# Patient Record
Sex: Male | Born: 1955 | Race: White | Hispanic: No | Marital: Married | Smoking: Never smoker
Health system: Southern US, Community
[De-identification: ages and names within clinical notes are randomized; demographics above are authoritative.]

## PROBLEM LIST (undated history)

## (undated) DIAGNOSIS — E785 Hyperlipidemia, unspecified: Secondary | ICD-10-CM

## (undated) DIAGNOSIS — J449 Chronic obstructive pulmonary disease, unspecified: Secondary | ICD-10-CM

## (undated) DIAGNOSIS — F32A Depression, unspecified: Secondary | ICD-10-CM

## (undated) DIAGNOSIS — L909 Atrophic disorder of skin, unspecified: Secondary | ICD-10-CM

## (undated) DIAGNOSIS — M1990 Unspecified osteoarthritis, unspecified site: Secondary | ICD-10-CM

## (undated) DIAGNOSIS — I10 Essential (primary) hypertension: Secondary | ICD-10-CM

## (undated) DIAGNOSIS — J189 Pneumonia, unspecified organism: Secondary | ICD-10-CM

## (undated) DIAGNOSIS — N209 Urinary calculus, unspecified: Secondary | ICD-10-CM

## (undated) DIAGNOSIS — N400 Enlarged prostate without lower urinary tract symptoms: Secondary | ICD-10-CM

## (undated) DIAGNOSIS — G4730 Sleep apnea, unspecified: Secondary | ICD-10-CM

## (undated) DIAGNOSIS — F419 Anxiety disorder, unspecified: Secondary | ICD-10-CM

## (undated) DIAGNOSIS — E669 Obesity, unspecified: Secondary | ICD-10-CM

## (undated) DIAGNOSIS — R0600 Dyspnea, unspecified: Secondary | ICD-10-CM

## (undated) DIAGNOSIS — L02219 Cutaneous abscess of trunk, unspecified: Secondary | ICD-10-CM

## (undated) DIAGNOSIS — E119 Type 2 diabetes mellitus without complications: Secondary | ICD-10-CM

## (undated) DIAGNOSIS — IMO0002 Reserved for concepts with insufficient information to code with codable children: Secondary | ICD-10-CM

## (undated) DIAGNOSIS — L919 Hypertrophic disorder of the skin, unspecified: Secondary | ICD-10-CM

## (undated) DIAGNOSIS — L03319 Cellulitis of trunk, unspecified: Secondary | ICD-10-CM

## (undated) HISTORY — DX: Reserved for concepts with insufficient information to code with codable children: IMO0002

## (undated) HISTORY — DX: Obesity, unspecified: E66.9

## (undated) HISTORY — PX: TONSILLECTOMY: SUR1361

## (undated) HISTORY — DX: Cellulitis of trunk, unspecified: L03.319

## (undated) HISTORY — DX: Type 2 diabetes mellitus without complications: E11.9

## (undated) HISTORY — DX: Hypertrophic disorder of the skin, unspecified: L91.9

## (undated) HISTORY — DX: Hyperlipidemia, unspecified: E78.5

## (undated) HISTORY — DX: Cutaneous abscess of trunk, unspecified: L02.219

## (undated) HISTORY — DX: Atrophic disorder of skin, unspecified: L90.9

## (undated) HISTORY — DX: Essential (primary) hypertension: I10

---

## 1978-08-05 HISTORY — PX: OTHER SURGICAL HISTORY: SHX169

## 2002-06-16 ENCOUNTER — Ambulatory Visit (HOSPITAL_BASED_OUTPATIENT_CLINIC_OR_DEPARTMENT_OTHER): Admission: RE | Admit: 2002-06-16 | Discharge: 2002-06-16 | Payer: Self-pay | Admitting: Otolaryngology

## 2002-08-05 HISTORY — PX: OTHER SURGICAL HISTORY: SHX169

## 2002-08-09 ENCOUNTER — Ambulatory Visit (HOSPITAL_BASED_OUTPATIENT_CLINIC_OR_DEPARTMENT_OTHER): Admission: RE | Admit: 2002-08-09 | Discharge: 2002-08-09 | Payer: Self-pay | Admitting: Otolaryngology

## 2002-08-18 ENCOUNTER — Emergency Department (HOSPITAL_COMMUNITY): Admission: EM | Admit: 2002-08-18 | Discharge: 2002-08-18 | Payer: Self-pay | Admitting: Emergency Medicine

## 2004-03-29 ENCOUNTER — Emergency Department (HOSPITAL_COMMUNITY): Admission: EM | Admit: 2004-03-29 | Discharge: 2004-03-29 | Payer: Self-pay | Admitting: Emergency Medicine

## 2004-04-10 ENCOUNTER — Encounter: Admission: RE | Admit: 2004-04-10 | Discharge: 2004-04-10 | Payer: Self-pay | Admitting: Internal Medicine

## 2006-08-14 ENCOUNTER — Encounter: Admission: RE | Admit: 2006-08-14 | Discharge: 2006-08-14 | Payer: Self-pay | Admitting: Internal Medicine

## 2007-12-21 ENCOUNTER — Ambulatory Visit: Payer: Self-pay | Admitting: Family Medicine

## 2007-12-21 DIAGNOSIS — Z87442 Personal history of urinary calculi: Secondary | ICD-10-CM | POA: Insufficient documentation

## 2007-12-21 DIAGNOSIS — K219 Gastro-esophageal reflux disease without esophagitis: Secondary | ICD-10-CM | POA: Insufficient documentation

## 2007-12-21 DIAGNOSIS — J309 Allergic rhinitis, unspecified: Secondary | ICD-10-CM | POA: Insufficient documentation

## 2007-12-22 ENCOUNTER — Emergency Department (HOSPITAL_COMMUNITY): Admission: EM | Admit: 2007-12-22 | Discharge: 2007-12-22 | Payer: Self-pay | Admitting: Emergency Medicine

## 2007-12-22 ENCOUNTER — Telehealth: Payer: Self-pay | Admitting: Family Medicine

## 2008-01-06 ENCOUNTER — Encounter: Payer: Self-pay | Admitting: Family Medicine

## 2008-03-29 ENCOUNTER — Telehealth: Payer: Self-pay | Admitting: Family Medicine

## 2008-04-04 ENCOUNTER — Ambulatory Visit: Payer: Self-pay | Admitting: Family Medicine

## 2008-04-04 DIAGNOSIS — D179 Benign lipomatous neoplasm, unspecified: Secondary | ICD-10-CM | POA: Insufficient documentation

## 2010-09-02 LAB — CONVERTED CEMR LAB
ALT: 24 units/L (ref 0–53)
AST: 24 units/L (ref 0–37)
Albumin: 4.2 g/dL (ref 3.5–5.2)
BUN: 16 mg/dL (ref 6–23)
Basophils Absolute: 0 10*3/uL (ref 0.0–0.1)
Basophils Relative: 0.6 % (ref 0.0–1.0)
Blood in Urine, dipstick: NEGATIVE
Chloride: 106 meq/L (ref 96–112)
Cholesterol: 232 mg/dL (ref 0–200)
Direct LDL: 162.1 mg/dL
Eosinophils Relative: 5.5 % — ABNORMAL HIGH (ref 0.0–5.0)
GFR calc non Af Amer: 95 mL/min
HDL: 34.3 mg/dL — ABNORMAL LOW (ref >39.0)
MCHC: 34.3 g/dL (ref 30.0–36.0)
Monocytes Absolute: 0.7 10*3/uL (ref 0.1–1.0)
Monocytes Relative: 10.6 % (ref 3.0–12.0)
Neutrophils Relative %: 60.8 % (ref 43.0–77.0)
Nitrite: NEGATIVE
PSA: 2.77 ng/mL (ref 0.10–4.00)
Platelets: 257 10*3/uL (ref 150–400)
RDW: 12.4 % (ref 11.5–14.6)
Specific Gravity, Urine: 1.025
TSH: 2.12 microintl units/mL (ref 0.35–5.50)
Total Protein: 7.2 g/dL (ref 6.0–8.3)
VLDL: 35 mg/dL (ref 0–40)

## 2010-12-21 NOTE — Op Note (Signed)
Clayton [name], Clayton [name]                          ACCOUNT NO.:  [id number]   MEDICAL RECORD NO.:  [id number]                   PATIENT TYPE:  AMB   LOCATION:  DSC                                  FACILITY:  [facility]   PHYSICIAN:  Kinnie Scales. Annalee Genta, M.D.            DATE OF BIRTH:  10/09/1955   DATE OF PROCEDURE:  08/09/2002  DATE OF DISCHARGE:                                 OPERATIVE REPORT   PREOPERATIVE DIAGNOSES:  1. Nasal septal deviation with nasal obstruction.  2. Inferior turbinate hypertrophy.  3. Uvula hypertrophy.  4. Mild obstructive sleep apnea.   POSTOPERATIVE DIAGNOSES:  1. Nasal septal deviation with nasal obstruction.  2. Inferior turbinate hypertrophy.  3. Uvula hypertrophy.  4. Mild obstructive sleep apnea.   OPERATION:  1. Nasal septoplasty.  2. Bilateral inferior turbinate intramural cautery.  3. Uvulectomy.   SURGEON:  Kinnie Scales. Annalee Genta, M.D.   ANESTHESIA:  General endotracheal   COMPLICATIONS:  None   ESTIMATED BLOOD LOSS:  Approximately 50 cc.   DISPOSITION:  The patient was transferred from the operating room to the  recovery room in stable condition.   FOLLOW UP:  He will follow up in my office in one week for postoperative  care.   BRIEF HISTORY:  The patient is a 55 year old male who is referred for  evaluation of heavy night-time snoring, nasal airway obstruction and  possible obstructive sleep apnea.  Work-up including inpatient sleep study  showed a mild level of sleep apnea with RDI of 11 events per hour.  On  examination, the patient was found to have severely deviated nasal septum  with nasal obstruction with nasal obstruction, turbinate hypertrophy and  uvula hypertrophy.  Given his history and findings, I recommended that we  undertake surgical intervention.  Septoplasty, turbinate reduction and  uvulectomy were discussed and the patient understood the risks, benefits,  and possible complications of each of these surgical procedures  and agreed  to proceed as outlined above.   DESCRIPTION OF PROCEDURE:  The patient was brought to the operating room on  August 09, 2002 and placed in a supine position on the operating table.  General endotracheal anesthesia was established without difficulty.  When  the patient was adequately anesthetized, his nasal cavity was injected with  12 cc of 1% Lidocaine with 1:100,000 epinephrine solution injected in a  submucosal fashion along the nasal septum and inferior turbinates  bilaterally.  The patient's nose was then packed with Afrin soaked cottonoid  pledgets.  These were left in place for approximately 10 minutes to allow  for vasoconstriction.  The patient was prepped and draped in a sterile  fashion.  The surgical procedure was begun with a left hemitransfixion  incision along the left aspect of the septum.  This was carried through the  mucosa and underlying submucosa and a mucoperichondrial flap was elevated  from anterior to posterior along the left hand side.  The patient had  severely deviated nasal septum with left septal spurring and obstruction.  Bony cartilaginous junction was crossed at the midline and a mucoperiosteal  flap was elevated along the patient's right hand side.  A 4 mm osteotome was  used to mobilize deviated bone and cartilage in the mid and posterior  aspects of the nasal septum and large septal spur was resected using the 4  mm osteotome preserving the overlying mucosa.  The mid cartilaginous septum  was resected.  The anterior columella and dorsal cartilage was left intact  as this was not significantly deviated.  Resected cartilage was morselized  and returned to the mucoperichondrial pocket and the mucoperichondrial flap  was reapproximated with a 4-0 gut suture on a Keith needle in a horizontal  mattress fashion.  Hemitransfixion incision was closed with the same stitch  and at the conclusion of the procedure bilateral Doyle nasal septal splints   were placed after the application of Bactroban ointment and sutured into  position with a 3-0 Ethibon suture.   Attention then turned to the inferior turbinates where bilateral inferior  turbinate intramural cautery was performed with bipolar cautery set at 12  watts, two paths were made in the submucosal fascia and each inferior  turbinate.  When turbinates were adequately cauterized they were out  fractured to create a more patent nasal cavity.   Attention was then turned to the patient's oral cavity and oral pharynx.  A  Crowe-Davis mouth gag was inserted without difficulty.  There were no loose  or broken teeth.  The patient was found to have uvula hypertrophy.  Using  Bovie electrocautery, the uvula was transected at its basal attachment to  the palate.  There was no active bleeding.  The patient's nasal cavity,  nasopharynx, oral cavity and oropharynx were then irrigated and suctioned.  An orogastric tube was passed and the stomach contents were aspirated.  The  patient was then awakened from his anesthetic.  He was extubated and was  transferred from the operating room to the recovery room in stable  condition.  There were no complications.  Estimated blood loss was less than  50 cc.                                               Kinnie Scales. Annalee Genta, M.D.    DLS/MEDQ  D:  16/05/9603  T:  08/09/2002  Job:  [id number]

## 2010-12-21 NOTE — Consult Note (Signed)
   NAME:  BUTLER, VEGH NO.:  [id number]   MEDICAL RECORD NO.:  [id number]                   PATIENT TYPE:  EMS   LOCATION:  MAJO                                 FACILITY:  [facility]   PHYSICIAN:  Kristine Garbe. Ezzard Standing, M.D.         DATE OF BIRTH:  11/24/1955   DATE OF CONSULTATION:  DATE OF DISCHARGE:                                   CONSULTATION   REASON FOR ER VISIT:  Patient with hemorrhage post uvulectomy eight days  ago.   BRIEF HISTORY:  Mr. Bautch is a 55 year old gentleman who is status post  nasal surgery and uvulectomy eight days ago and starting having some  bleeding from the uvular region this morning, was seen by Dr. Arletha Grippe in the  office where a stitch was placed and the area was cauterized with silver  nitrate.  This evening, about 7 o'clock, he had recurrent bleeding that  persisted for over an hour.  He presents to the emergency room because of  recurrent bleeding from uvula status post uvulectomy.   On initial exam in the ER the bleeding had ceased and the uvula was clear  with no clot, but after a brief episode of coughing he had recurrent  bleeding from the right side of the uvula, this area was cauterized again  with silver nitrate and stopped the oozing.  There is no obvious brisk  bleeding but more of an ooze from the right side of the uvula.   IMPRESSION:  Hemorrhage status post uvulectomy.   RECOMMENDATIONS:  This was cauterized with silver nitrate in the emergency  room, he was given a prescription for Lortab elixir 15-30 cc q.4h. p.r.n.  pain, instructed on a liquid diet and to contact Dr. Flossie Dibble office in  the morning.                                                 Kristine Garbe. Ezzard Standing, M.D.    CEN/MEDQ  D:  08/18/2002  T:  08/19/2002  Job:  [id number]   cc:   Peggyann Shoals, M.D.

## 2011-05-01 LAB — URINALYSIS, ROUTINE W REFLEX MICROSCOPIC
Bilirubin Urine: NEGATIVE
Glucose, UA: 100 — AB
Nitrite: NEGATIVE
Urobilinogen, UA: 0.2
pH: 6

## 2011-05-01 LAB — POCT I-STAT, CHEM 8
BUN: 18
Calcium, Ion: 1.14
Glucose, Bld: 109 — ABNORMAL HIGH
Potassium: 4
Sodium: 141
TCO2: 24

## 2011-05-01 LAB — URINE MICROSCOPIC-ADD ON

## 2011-06-24 ENCOUNTER — Encounter: Payer: Self-pay | Admitting: Emergency Medicine

## 2011-06-24 ENCOUNTER — Encounter (INDEPENDENT_AMBULATORY_CARE_PROVIDER_SITE_OTHER): Payer: Self-pay

## 2011-06-24 ENCOUNTER — Encounter (INDEPENDENT_AMBULATORY_CARE_PROVIDER_SITE_OTHER): Payer: Self-pay | Admitting: Surgery

## 2011-06-25 ENCOUNTER — Encounter: Payer: Self-pay | Admitting: Emergency Medicine

## 2011-06-25 ENCOUNTER — Ambulatory Visit (INDEPENDENT_AMBULATORY_CARE_PROVIDER_SITE_OTHER): Payer: Managed Care, Other (non HMO) | Admitting: Emergency Medicine

## 2011-06-25 VITALS — BP 120/78 | HR 83 | Temp 98.1°F | Ht 69.0 in | Wt 249.4 lb

## 2011-06-25 DIAGNOSIS — R05 Cough: Secondary | ICD-10-CM

## 2011-06-25 DIAGNOSIS — R053 Chronic cough: Secondary | ICD-10-CM | POA: Insufficient documentation

## 2011-06-25 DIAGNOSIS — R059 Cough, unspecified: Secondary | ICD-10-CM

## 2011-06-25 NOTE — Patient Instructions (Addendum)
We will perform full Pulmonary Function Testing at your follow up visit Stop Flovent for now You can use the ProAir if needed for your breathing or cough Follow with Dr Delton Coombes next available opening

## 2011-06-25 NOTE — Assessment & Plan Note (Signed)
Could reflect cough-variant asthma. I do not see anything on CXR to support ILD or HSP (from the bat exposure). Need to determine whether this is primarily upper airway vs lower, will get PFT to determine - fullPFT - rov to review -stop flovent for now as potential irritant to the UA - If no clear evidence asthma on PFT then would empirically treat GERD and PND

## 2011-06-25 NOTE — Progress Notes (Signed)
Subjective:    Patient ID: Clayton [name], male    DOB: 1955/08/15, 55 y.o.   MRN: [id number]  HPI 55 yo man, never smoker, has had difficulty with chronic cough for many years, almost always following a URI and then lasting up to 3 months. Can be bothered by dust, fumes and the cold air temperature. He has been treated before with inhaled BD's, also with inhaled steroids. Has also been rx in the remote past for GERD, can't recall whether this helped the cough. Has had allergic sx in past w cats and dogs.  Has not had a lot of drainage or PND during most recent episode last 2 months. Cough sometimes brings up white, sometimes non-productive. Cough often set off by talking, also recently raking the leaves. Was recently started on flovent + SABA, not using the flovent regularly. He gets SOB at times with the cough, can also happen w climbing stairs. No wheezing. Has been on pred in the past, can't recall if it helped.   He has noticed that there are bats in the attic of home, has some exposure. Has been in [employer], [place] travel. No known TB exposure.    Review of Systems  Constitutional: Negative.  Negative for fever and unexpected weight change.  HENT: Negative.  Negative for ear pain, nosebleeds, congestion, sore throat, rhinorrhea, sneezing, trouble swallowing, dental problem, postnasal drip and sinus pressure.   Eyes: Negative.  Negative for redness and itching.  Respiratory: Positive for cough and shortness of breath. Negative for chest tightness and wheezing.   Cardiovascular: Negative.  Negative for palpitations and leg swelling.  Gastrointestinal: Negative.  Negative for nausea and vomiting.  Genitourinary: Negative.  Negative for dysuria.  Musculoskeletal: Negative.  Negative for joint swelling.  Skin: Negative.  Negative for rash.  Neurological: Negative.  Negative for headaches.  Hematological: Negative.  Does not bruise/bleed easily.  Psychiatric/Behavioral: Negative.  Negative  for dysphoric mood. The patient is not nervous/anxious.     Past Medical History  Diagnosis Date  . Asthma   . Obesity   . Cyst     shoulder  . Vitamin D deficiency   . Hypertrophic condition of skin   . Atrophic condition of skin   . Cellulitis/abscess - trunk      Family History  Problem Relation Age of Onset  . Heart attack Father   . Allergies Mother   . Asthma Mother      History   Social History  . Marital Status: Married    Spouse Name: N/A    Number of Children: N/A  . 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 Smoking status: Never Smoker   . Smokeless tobacco: Never Used  . Alcohol Use: Yes  . Drug Use: No  . Sexually Active: Not on file   Other Topics Concern  . Not on file   Social History Narrative  . No narrative on file     Allergies no known allergies   Outpatient Prescriptions Prior to Visit  Medication Sig Dispense Refill  . albuterol (PROVENTIL HFA;VENTOLIN HFA) 108 (90 BASE) MCG/ACT inhaler Inhale 2 puffs into the lungs daily.        . Cholecalciferol (VITAMIN D3) 5000 UNITS CAPS Take 1 capsule by mouth daily as needed.            Objective:   Physical Exam  Gen: Pleasant, well-nourished, in no distress,  normal affect  ENT: No lesions,  mouth clear,  oropharynx clear, no postnasal drip  Neck: No JVD, no TMG, no carotid bruits  Lungs: No use of accessory muscles, no dullness to percussion, clear without rales or rhonchi  Cardiovascular: RRR, heart sounds normal, no murmur or gallops, no peripheral edema  Abdomen: soft and NT, obese,   Musculoskeletal: No deformities, no cyanosis or clubbing  Neuro: alert, non focal  Skin: Warm, no lesions or rashes     Assessment & Plan:  Chronic cough Could reflect cough-variant asthma. I do not see anything on CXR to support ILD or HSP (from the bat exposure). Need to determine whether this is primarily upper airway vs  lower, will get PFT to determine - fullPFT - rov to review -stop flovent for now as potential irritant to the UA - If no clear evidence asthma on PFT then would empirically treat GERD and PND

## 2011-06-26 ENCOUNTER — Ambulatory Visit (INDEPENDENT_AMBULATORY_CARE_PROVIDER_SITE_OTHER): Payer: Managed Care, Other (non HMO) | Admitting: Surgery

## 2011-06-26 ENCOUNTER — Encounter (INDEPENDENT_AMBULATORY_CARE_PROVIDER_SITE_OTHER): Payer: Self-pay | Admitting: Surgery

## 2011-06-26 DIAGNOSIS — D179 Benign lipomatous neoplasm, unspecified: Secondary | ICD-10-CM

## 2011-06-26 NOTE — Progress Notes (Signed)
[facility]  Ovidio Kin, MD,  FACS [address] [place]  [address]  [place], [place]    [address] Phone:  [phone] FAX:  [phone]  Re:   Clayton [name] DOB:   October 04, 1955 MRN:   [id number]  ASSESSMENT AND PLAN: 1.  Mass on left back.   6 x 7 cm  I discussed the indications and complications of removal of soft tissue masses. Risks of surgery include bleeding, infection, and nerve injury.  I gave him literature on the soft tissue masses/lipomas.  Will schedule at a time convenient with him.  2.  Asthma.  Significant cough after any cold.  Seen by Dr. Langston Masker.  3.  Obesity.  Chief Complaint  Patient presents with  . Other    new pt- eval lt shoulder lipoma   REFERRING PHYSICIAN: Thayer Headings, MD, MD  HISTORY OF PRESENT ILLNESS: Clayton [name] is a 55 y.o. (DOB: 20-Jul-1956))  white male whose primary care physician is Thayer Headings, MD, MD and comes to me today for lipoma left back.  He has noticed this mass about 2 years.  It has steadily enlarged.  He comes to discuss removal of this mass.    Past Medical History  Diagnosis Date  . Asthma   . Obesity   . Cyst     shoulder  . Vitamin D deficiency   . Hypertrophic condition of skin   . Atrophic condition of skin   . Cellulitis/abscess - trunk   . Lipoma       Past Surgical History  Procedure Date  . Nasal sinus surgery---uvula removed 2004    Dr. Annalee Genta  . Cyst removed from tailbone 1980      Current Outpatient Prescriptions  Medication Sig Dispense Refill  . albuterol (PROVENTIL HFA;VENTOLIN HFA) 108 (90 BASE) MCG/ACT inhaler Inhale 2 puffs into the lungs daily.        . Cholecalciferol (VITAMIN D3) 5000 UNITS CAPS Take 1 capsule by mouth daily as needed.          No Known Allergies  REVIEW OF SYSTEMS: Skin:  No history of rash.  No history of abnormal moles. Infection:  No history of hepatitis or HIV.  No history of MRSA. Neurologic:  No history of stroke.  No history  of seizure.  No history of headaches. Cardiac:  No history of hypertension. No history of heart disease. Pulmonary:  Asthma.  Endocrine:  No diabetes. No thyroid disease. Gastrointestinal:  No history of stomach disease.  No history of liver disease.  No history of gall bladder disease.  No history of pancreas disease.  No history of colon disease. Urologic:  No history of kidney stones.  No history of bladder infections. Musculoskeletal:  No history of joint or back disease. Hematologic:  No bleeding disorder.  No history of anemia.  Not anticoagulated. Psycho-social:  The patient is oriented.   The patient has no obvious psychologic or social impairment to understanding our conversation and plan.  SOCIAL and FAMILY HISTORY: Works as Chief Strategy Officer at [employer]. Married.  Has ADHD son.  PHYSICAL EXAM: BP 152/90  Pulse 60  Temp(Src) 97.4 F (36.3 C) (Temporal)  Resp 16  Ht 5\' 9"  (1.753 m)  Wt 247 lb 12.8 oz (112.401 kg)  BMI 36.59 kg/m2  General: WN mildly obese WM who is alert and generally healthy appearing.  HEENT: Normal. Pupils equal. Good dentition. Neck: Supple. No mass.  No thyroid mass.  Carotid pulse okay with no bruit. Lymph Nodes:  No supraclavicular or cervical nodes. Lungs: Clear to auscultation and symmetric breath sounds.  6 x 7 cm mass on left shoulder that looks like a lipoma. Heart:  RRR. No murmur or rub. Abdomen: Soft. No mass. No tenderness. No hernia. Normal bowel sounds.  No abdominal scars. Extremities:  Good strength and ROM  in upper and lower extremities. Neurologic:  Grossly intact to motor and sensory function. Psychiatric: Has normal mood and affect. Behavior is normal.   DATA REVIEWED: Notes from Dr. Tinnie Gens, MD, FACS Phone:  [phone]

## 2011-07-15 DIAGNOSIS — D1739 Benign lipomatous neoplasm of skin and subcutaneous tissue of other sites: Secondary | ICD-10-CM

## 2011-07-15 HISTORY — PX: MASS EXCISION: SHX2000

## 2011-07-25 ENCOUNTER — Encounter: Payer: Self-pay | Admitting: Emergency Medicine

## 2011-07-25 ENCOUNTER — Ambulatory Visit (INDEPENDENT_AMBULATORY_CARE_PROVIDER_SITE_OTHER): Payer: Managed Care, Other (non HMO) | Admitting: Emergency Medicine

## 2011-07-25 ENCOUNTER — Encounter (INDEPENDENT_AMBULATORY_CARE_PROVIDER_SITE_OTHER): Payer: Self-pay | Admitting: Surgery

## 2011-07-25 ENCOUNTER — Ambulatory Visit (INDEPENDENT_AMBULATORY_CARE_PROVIDER_SITE_OTHER): Payer: Managed Care, Other (non HMO) | Admitting: Surgery

## 2011-07-25 VITALS — BP 162/86 | HR 64 | Temp 97.4°F | Resp 18 | Ht 69.0 in | Wt 243.5 lb

## 2011-07-25 VITALS — BP 140/82 | HR 101 | Temp 98.2°F | Ht 69.0 in | Wt 243.0 lb

## 2011-07-25 DIAGNOSIS — R053 Chronic cough: Secondary | ICD-10-CM

## 2011-07-25 DIAGNOSIS — D179 Benign lipomatous neoplasm, unspecified: Secondary | ICD-10-CM

## 2011-07-25 DIAGNOSIS — R059 Cough, unspecified: Secondary | ICD-10-CM

## 2011-07-25 DIAGNOSIS — R05 Cough: Secondary | ICD-10-CM

## 2011-07-25 LAB — PULMONARY FUNCTION TEST

## 2011-07-25 NOTE — Progress Notes (Signed)
  Subjective:    Patient ID: Clayton [name], male    DOB: 1956/02/21, 55 y.o.   MRN: [id number] HPI 55 yo man, never smoker, has had difficulty with chronic cough for many years, almost always following a URI and then lasting up to 3 months. Can be bothered by dust, fumes and the cold air temperature. He has been treated before with inhaled BD's, also with inhaled steroids. Has also been rx in the remote past for GERD, can't recall whether this helped the cough. Has had allergic sx in past w cats and dogs.  Has not had a lot of drainage or PND during most recent episode last 2 months. Cough sometimes brings up white, sometimes non-productive. Cough often set off by talking, also recently raking the leaves. Was recently started on flovent + SABA, not using the flovent regularly. He gets SOB at times with the cough, can also happen w climbing stairs. No wheezing. Has been on pred in the past, can't recall if it helped.   He has noticed that there are bats in the attic of home, has some exposure. Has been in [employer], [place] travel. No known TB exposure.   ROV 07/25/11 -- follows up for cough and dyspnea. He had PFT today that show normal airflows but possible tendency towards AFL on his flow volume loop. His cough has waxed and waned since last visit because he caught another URI.       Objective:   Physical Exam  Gen: Pleasant, well-nourished, in no distress,  normal affect  ENT: No lesions,  mouth clear,  oropharynx clear, no postnasal drip  Neck: No JVD, no TMG, no carotid bruits  Lungs: No use of accessory muscles, no dullness to percussion, clear without rales or rhonchi  Cardiovascular: RRR, heart sounds normal, no murmur or gallops, no peripheral edema  Abdomen: soft and NT, obese,   Musculoskeletal: No deformities, no cyanosis or clubbing  Neuro: alert, non focal  Skin: Warm, no lesions or rashes     Assessment & Plan:  Chronic cough PFT reassuring although f/v loop could  suggest propensity towards AFL. Suspect GERD and allergies are contributing. Will attempt to treat both when he flares. If no improvement then methacholine challenge.

## 2011-07-25 NOTE — Assessment & Plan Note (Signed)
PFT reassuring although f/v loop could suggest propensity towards AFL. Suspect GERD and allergies are contributing. Will attempt to treat both when he flares. If no improvement then methacholine challenge.

## 2011-07-25 NOTE — Progress Notes (Signed)
[facility]  Ovidio Kin, MD,  FACS [address] [place]  [address] [place], [place]    [address] Phone:  [phone] FAX:  [phone]   Re:   Clayton [name] DOB:   Aug 04, 1956 MRN:   [id number]  ASSESSMENT AND PLAN: 1. Mass on left back. 6 x 7 cm   Excised 07/15/2011.  Path report has not made computer.  We have a call into path dept.  Wound looks good.  Return to office prn.  2. Asthma.   Significant cough after any cold.   Seen by Dr. Langston Masker.  3. Obesity.  HISTORY OF PRESENT ILLNESS: Chief Complaint  Patient presents with  . Routine Post Op    recheck excision of left shoulder mass     Clayton [name] is a 55 y.o. (DOB: 1955-12-17)  hispanic male who is a patient of MACKENZIE,BRIAN, MD, MD and comes to me today for follow up of excision of mass on back  The wound looks good. The patient's wife is here.  PHYSICAL EXAM: BP 162/86  Pulse 64  Temp(Src) 97.4 F (36.3 C) (Temporal)  Resp 18  Ht 5\' 9"  (1.753 m)  Wt 243 lb 8 oz (110.451 kg)  BMI 35.96 kg/m2  Back:  Left shoulder wound looks good.  DATA REVIEWED: Path report pending.   Ovidio Kin, MD, FACS Office:  [phone]

## 2011-07-25 NOTE — Progress Notes (Signed)
PFT done today. 

## 2011-07-25 NOTE — Patient Instructions (Signed)
When your cough is flaring start both loratadine 10mg  daily (Claritin) and omeprazole 20mg  daily (Prilosec) and take the for 2 -3 weeks straight.  If your cough persists or if breathing changes the return to see Dr Delton Coombes. We may decide to perform further testing to completely rule out an asthma component to your cough.

## 2014-10-03 ENCOUNTER — Encounter (HOSPITAL_COMMUNITY): Payer: Self-pay | Admitting: Emergency Medicine

## 2014-10-03 ENCOUNTER — Emergency Department (HOSPITAL_COMMUNITY)
Admission: EM | Admit: 2014-10-03 | Discharge: 2014-10-03 | Disposition: A | Discharge status: Home / Self Care | Payer: BLUE CROSS/BLUE SHIELD | Attending: Emergency Medicine | Admitting: Emergency Medicine

## 2014-10-03 DIAGNOSIS — E559 Vitamin D deficiency, unspecified: Secondary | ICD-10-CM | POA: Insufficient documentation

## 2014-10-03 DIAGNOSIS — Z79899 Other long term (current) drug therapy: Secondary | ICD-10-CM | POA: Insufficient documentation

## 2014-10-03 DIAGNOSIS — Z87898 Personal history of other specified conditions: Secondary | ICD-10-CM | POA: Diagnosis not present

## 2014-10-03 DIAGNOSIS — R040 Epistaxis: Secondary | ICD-10-CM | POA: Insufficient documentation

## 2014-10-03 DIAGNOSIS — Z872 Personal history of diseases of the skin and subcutaneous tissue: Secondary | ICD-10-CM | POA: Diagnosis not present

## 2014-10-03 DIAGNOSIS — E669 Obesity, unspecified: Secondary | ICD-10-CM | POA: Diagnosis not present

## 2014-10-03 DIAGNOSIS — J45909 Unspecified asthma, uncomplicated: Secondary | ICD-10-CM | POA: Diagnosis not present

## 2014-10-03 NOTE — ED Provider Notes (Signed)
CSN: [id number]     Arrival date & time 10/03/14  0808 History   First MD Initiated Contact with Patient 10/03/14 [phone]     Chief Complaint  Patient presents with  . Epistaxis     (Consider location/radiation/quality/duration/timing/severity/associated sxs/prior Treatment) Patient is a 59 y.o. male presenting with nosebleeds. The history is provided by the patient and the spouse.  Epistaxis Location:  L nare Severity:  Mild Duration:  3 days Timing:  Intermittent Progression:  Resolved Chronicity:  New Context: weather change   Relieved by:  Vasoconstrictors Worsened by:  Nothing tried Associated symptoms: no blood in oropharynx, no congestion, no cough, no dizziness, no facial pain, no fever, no headaches, no sinus pain, no sneezing, no sore throat and no syncope     Past Medical History  Diagnosis Date  . Asthma   . Obesity   . Cyst     shoulder  . Vitamin D deficiency   . Hypertrophic condition of skin   . Atrophic condition of skin   . Cellulitis/abscess - trunk   . Lipoma    Past Surgical History  Procedure Laterality Date  . Nasal sinus surgery---uvula removed  2004    Dr. Wilburn Cornelia  . Cyst removed from [place]  . Mass excision  07/15/11    left shoulder mass    Family History  Problem Relation Age of Onset  . Heart attack Father   . Heart disease Father     heart attack  . Allergies Mother   . Asthma Mother    History  Substance Use Topics  . Smoking status: Never Smoker   . Smokeless tobacco: Never Used  . Alcohol Use: Yes    Review of Systems  Constitutional: Negative for fever, activity change, appetite change and fatigue.  HENT: Positive for nosebleeds. Negative for congestion, facial swelling, rhinorrhea, sneezing, sore throat and trouble swallowing.   Eyes: Negative for photophobia and pain.  Respiratory: Negative for cough, chest tightness and shortness of breath.   Cardiovascular: Negative for chest pain, leg swelling and syncope.    Gastrointestinal: Negative for nausea, vomiting, abdominal pain, diarrhea and constipation.  Endocrine: Negative for polydipsia and polyuria.  Genitourinary: Negative for dysuria, urgency, decreased urine volume and difficulty urinating.  Musculoskeletal: Negative for back pain and gait problem.  Skin: Negative for color change, rash and wound.  Allergic/Immunologic: Negative for immunocompromised state.  Neurological: Negative for dizziness, facial asymmetry, speech difficulty, weakness, numbness and headaches.  Psychiatric/Behavioral: Negative for confusion, decreased concentration and agitation.      Allergies  Review of patient's allergies indicates no known allergies.  Home Medications   Prior to Admission medications   Medication Sig Start Date End Date Taking? Authorizing Provider  albuterol (PROVENTIL HFA;VENTOLIN HFA) 108 (90 BASE) MCG/ACT inhaler Inhale 2 puffs into the lungs daily.     Yes Historical Provider, MD  Cholecalciferol (VITAMIN D3) 5000 UNITS CAPS Take 1 capsule by mouth daily as needed.    Yes Historical Provider, MD  ibuprofen (ADVIL,MOTRIN) 200 MG tablet Take 400-600 mg by mouth every 6 (six) hours as needed for headache or moderate pain.   Yes Historical Provider, MD   BP 166/83 mmHg  Pulse 80  Temp(Src) 97.9 F (36.6 C) (Oral)  Resp 18  SpO2 96% Physical Exam  Constitutional: He is oriented to person, place, and time. He appears well-developed and well-nourished. No distress.  HENT:  Head: Normocephalic and atraumatic.  Nose:    Mouth/Throat: No oropharyngeal exudate.  Eyes: Pupils are equal, round, and reactive to light.  Neck: Normal range of motion. Neck supple.  Cardiovascular: Normal rate, regular rhythm and normal heart sounds.  Exam reveals no gallop and no friction rub.   No murmur heard. Pulmonary/Chest: Effort normal and breath sounds normal. No respiratory distress. He has no wheezes. He has no rales.  Abdominal: Soft. Bowel sounds are  normal. He exhibits no distension and no mass. There is no tenderness. There is no rebound and no guarding.  Musculoskeletal: Normal range of motion. He exhibits no edema or tenderness.  Neurological: He is alert and oriented to person, place, and time.  Skin: Skin is warm and dry.  Psychiatric: He has a normal mood and affect.    ED Course  Procedures (including critical care time) Labs Review Labs Reviewed - No data to display  Imaging Review No results found.   EKG Interpretation None      MDM   Final diagnoses:  Anterior epistaxis    Pt is a 60 y.o. male with Pmhx as above who presents with 3 days of intermittent L sided nose bleed, now resolved for about 30 mins. No Systemic symptoms, no anticoagulants. VSS, pt in NAD. On PE he has scabbing in anterior septum, no active bleeding. Afrin given as well as return precautions and management instructions should bleeding reoccur.      [name] evaluation in the Emergency Department is complete. It has been determined that no acute conditions requiring further emergency intervention are present at this time. The patient/guardian have been advised of the diagnosis and plan. We have discussed signs and symptoms that warrant return to the ED, such as changes or worsening in symptoms, lightheadedness, weakness, passing out.       Ernestina Patches, MD 10/03/14 [phone]

## 2014-10-03 NOTE — ED Notes (Addendum)
Per EMS pt intermittent nosebleed from left nare since Friday. Pt reports surgical hx in 2004 of soft tissue removal resulting in weakened vessel and recurrent nosebleeds. Pt given affrin 2 spray in each nare en route EMS.

## 2014-10-03 NOTE — Discharge Instructions (Signed)
Nosebleed °A nosebleed can be caused by many things, including: °· Getting hit hard in the nose. °· Infections. °· Dry nose. °· Colds. °· Medicines. °Your doctor may do lab testing if you get nosebleeds a lot and the cause is not known. °HOME CARE  °· If your nose was packed with material, keep it there until your doctor takes it out. Put the pack back in your nose if the pack falls out. °· Do not blow your nose for 12 hours after the nosebleed. °· Sit up and bend forward if your nose starts bleeding again. Pinch the front half of your nose nonstop for 20 minutes. °· Put petroleum jelly inside your nose every morning if you have a dry nose. °· Use a humidifier to make the air less dry. °· Do not take aspirin. °· Try not to strain, lift, or bend at the waist for many days after the nosebleed. °GET HELP RIGHT AWAY IF:  °· Nosebleeds keep happening and are hard to stop or control. °· You have bleeding or bruises that are not normal on other parts of the body. °· You have a fever. °· The nosebleeds get worse. °· You get lightheaded, feel faint, sweaty, or throw up (vomit) blood. °MAKE SURE YOU:  °· Understand these instructions. °· Will watch your condition. °· Will get help right away if you are not doing well or get worse. °Document Released: 04/30/2008 Document Revised: 10/14/2011 Document Reviewed: 04/30/2008 °Exit[employer]® Patient Information ©2015 [employer]. This information is not intended to replace advice given to you by your health care provider. Make sure you discuss any questions you have with your health care provider. ° °

## 2014-10-03 NOTE — ED Notes (Signed)
Bed: WA06 Expected date:  Expected time:  Means of arrival:  Comments: EMS- nosebleed

## 2015-04-06 HISTORY — PX: PROSTATE BIOPSY: SHX241

## 2015-04-24 ENCOUNTER — Ambulatory Visit: Payer: Self-pay | Admitting: General Surgery

## 2015-04-24 NOTE — H&P (Signed)
History of Present Illness Clayton Ok MD; 04/24/2015 9:16 AM) Patient words: evaluate umbilical hernia.  The patient is a 59 year old male who presents with an umbilical hernia. The patient is a 59 year old male who is referred by Dr. Kathyrn Lass for evaluation of an umbilical hernia. Patient states that the hernia is been there for approximately a year. He states it become more symptomatic over the last several months. He is able to self reduce the hernia. He's had no signs or symptoms of incarceration or strangulation. The patient has borderline hypertension diabetes and is being followed by Dr. Kathyrn Lass.   Other Problems Clayton [name], Clayton [name]; 04/24/2015 8:56 AM) Kidney Joaquim Lai Sleep Apnea Umbilical Hernia Repair  Past Surgical History Clayton [name], Clayton [name]; 04/24/2015 8:56 AM) No pertinent past surgical history  Diagnostic Studies History Clayton [name], Clayton [name]; 04/24/2015 8:56 AM) Colonoscopy 1-5 years ago  Medication History Clayton [name], CMA; 04/24/2015 8:59 AM) Albuterol Sulfate HFA (108 (90 Base)MCG/ACT Aerosol Soln, Inhalation) Active. Vitamin D3 (5000UNIT Tablet, Oral) Active. Ibuprofen (200MG  Tablet, Oral) Active. Medications Reconciled  Social History Clayton [name], Clayton [name]; 04/24/2015 8:56 AM) Alcohol use Occasional alcohol use. Caffeine use Carbonated beverages, Coffee, Tea. No drug use Tobacco use Never smoker.  Family History Clayton [name], Clayton [name]; 04/24/2015 8:56 AM) Arthritis Brother, Father, Mother. Diabetes Mellitus Father. Heart Disease Father. Hypertension Father. Respiratory Condition Mother. Thyroid problems Mother.  Review of Systems Clayton [name] CMA; 04/24/2015 8:56 AM) General Not Present- Appetite Loss, Chills, Fatigue, Fever, Night Sweats, Weight Gain and Weight Loss. Skin Present- Change in Wart/Mole. Not Present- Dryness, Hives, Jaundice, New Lesions, Non-Healing Wounds, Rash and Ulcer. HEENT Not Present- Earache, Hearing Loss,  Hoarseness, Nose Bleed, Oral Ulcers, Ringing in the Ears, Seasonal Allergies, Sinus Pain, Sore Throat, Visual Disturbances, Wears glasses/contact lenses and Yellow Eyes. Respiratory Present- Snoring. Not Present- Bloody sputum, Chronic Cough, Difficulty Breathing and Wheezing. Breast Not Present- Breast Mass, Breast Pain, Nipple Discharge and Skin Changes. Cardiovascular Not Present- Chest Pain, Difficulty Breathing Lying Down, Leg Cramps, Palpitations, Rapid Heart Rate, Shortness of Breath and Swelling of Extremities. Gastrointestinal Not Present- Abdominal Pain, Bloating, Bloody Stool, Change in Bowel Habits, Chronic diarrhea, Constipation, Difficulty Swallowing, Excessive gas, Gets full quickly at meals, Hemorrhoids, Indigestion, Nausea, Rectal Pain and Vomiting. Male Genitourinary Not Present- Blood in Urine, Change in Urinary Stream, Frequency, Impotence, Nocturia, Painful Urination, Urgency and Urine Leakage. Musculoskeletal Not Present- Back Pain, Joint Pain, Joint Stiffness, Muscle Pain, Muscle Weakness and Swelling of Extremities. Neurological Not Present- Decreased Memory, Fainting, Headaches, Numbness, Seizures, Tingling, Tremor, Trouble walking and Weakness. Psychiatric Not Present- Anxiety, Bipolar, Change in Sleep Pattern, Depression, Fearful and Frequent crying. Endocrine Not Present- Cold Intolerance, Excessive Hunger, Hair Changes, Heat Intolerance, Hot flashes and New Diabetes. Hematology Not Present- Easy Bruising, Excessive bleeding, Gland problems, HIV and Persistent Infections.   Vitals Clayton [name] CMA; 04/24/2015 8:56 AM) 04/24/2015 8:56 AM Weight: 245.6 lb Height: 69in Body Surface Area: 2.33 m Body Mass Index: 36.27 kg/m Temp.: 69F(Temporal)  Pulse: 72 (Regular)  Resp.: 16 (Unlabored)  BP: 142/88 (Sitting, Left Arm, Standard)    Physical Exam Clayton Ok MD; 04/24/2015 9:16 AM) General Mental Status-Alert. General Appearance-Consistent with  stated age. Hydration-Well hydrated. Voice-Normal.  Head and Neck Head-normocephalic, atraumatic with no lesions or palpable masses. Trachea-midline. Thyroid Gland Characteristics - normal size and consistency.  Chest and Lung Exam Chest and lung exam reveals -quiet, even and easy respiratory effort with no use of accessory muscles and on auscultation, normal breath sounds, no adventitious sounds  and normal vocal resonance. Inspection Chest Wall - Normal. Back - normal.  Cardiovascular Cardiovascular examination reveals -normal heart sounds, regular rate and rhythm with no murmurs and normal pedal pulses bilaterally.  Abdomen Inspection Skin - Scar - no surgical scars. Hernias - Umbilical hernia - Reducible(Approximately 2 cm umbilical hernia approximately 2 cm umbilical hernia). Palpation/Percussion Normal exam - Soft, Non Tender, No Rebound tenderness, No Rigidity (guarding) and No hepatosplenomegaly. Auscultation Normal exam - Bowel sounds normal.    Assessment & Plan Clayton Ok MD; 5/53/7482 7:07 AM) UMBILICAL HERNIA WITHOUT OBSTRUCTION AND WITHOUT GANGRENE (K42.9) Impression: 59 year old male with a primary umbilical hernia.  1. The patient would like to proceed to the operative for laparoscopic umbilical hernia. Mesh 2. All risks and benefits were discussed with the patient to generally include, but not limited to: infection, bleeding, damage to surrounding structures, acute and chronic nerve pain, and recurrence. Alternatives were offered and described. All questions were answered and the patient voiced understanding of the procedure and wishes to proceed at this point with hernia repair.

## 2015-05-19 ENCOUNTER — Encounter (HOSPITAL_COMMUNITY): Payer: Self-pay

## 2015-05-19 ENCOUNTER — Encounter (HOSPITAL_COMMUNITY)
Admission: RE | Admit: 2015-05-19 | Discharge: 2015-05-19 | Disposition: A | Discharge status: Home / Self Care | Payer: BLUE CROSS/BLUE SHIELD | Source: Ambulatory Visit | Attending: General Surgery | Admitting: General Surgery

## 2015-05-19 DIAGNOSIS — Z01812 Encounter for preprocedural laboratory examination: Secondary | ICD-10-CM | POA: Diagnosis not present

## 2015-05-19 DIAGNOSIS — K429 Umbilical hernia without obstruction or gangrene: Secondary | ICD-10-CM | POA: Diagnosis not present

## 2015-05-19 HISTORY — DX: Sleep apnea, unspecified: G47.30

## 2015-05-19 HISTORY — DX: Pneumonia, unspecified organism: J18.9

## 2015-05-19 HISTORY — DX: Urinary calculus, unspecified: N20.9

## 2015-05-19 HISTORY — DX: Unspecified osteoarthritis, unspecified site: M19.90

## 2015-05-19 LAB — CBC
HCT: 46.4 % (ref 39.0–52.0)
HEMOGLOBIN: 15.6 g/dL (ref 13.0–17.0)
MCH: 30.7 pg (ref 26.0–34.0)
MCHC: 33.6 g/dL (ref 30.0–36.0)
MCV: 91.3 fL (ref 78.0–100.0)
PLATELETS: 266 10*3/uL (ref 150–400)
RBC: 5.08 MIL/uL (ref 4.22–5.81)
RDW: 13.2 % (ref 11.5–15.5)
WBC: 11.8 10*3/uL — AB (ref 4.0–10.5)

## 2015-05-19 LAB — BASIC METABOLIC PANEL
Anion gap: 9 (ref 5–15)
BUN: 17 mg/dL (ref 6–20)
CALCIUM: 9.3 mg/dL (ref 8.9–10.3)
CHLORIDE: 104 mmol/L (ref 101–111)
CO2: 24 mmol/L (ref 22–32)
CREATININE: 0.8 mg/dL (ref 0.61–1.24)
GFR calc Af Amer: >60 mL/min (ref >60)
GFR calc non Af Amer: >60 mL/min (ref >60)
Glucose, Bld: 118 mg/dL — ABNORMAL HIGH (ref 65–99)
Potassium: 4.1 mmol/L (ref 3.5–5.1)
SODIUM: 137 mmol/L (ref 135–145)

## 2015-05-19 NOTE — Pre-Procedure Instructions (Addendum)
Clayton [name]  05/19/2015      [id number] [place], [place] [address] [place] [address] Phone: [phone] Fax: [phone]  [place], [place] [address] Phone: [phone] Fax: [phone]    Your procedure is scheduled on 05/23/15.  Report to [facility] short stay admitting at 530 A.M.  Call this number if you have problems the morning of surgery:  [phone]   Remember:  Do not eat food or drink liquids after midnight.  Take these medicines the morning of surgery with A SIP OF WATER inhaler if needed    STOP all herbel meds, nsaids (aleve,naproxen,advil,ibuprofen) Today including vitamins,aspirin   Do not wear jewelry, make-up or nail polish.  Do not wear lotions, powders, or perfumes.  You may wear deodorant.  Do not shave 48 hours prior to surgery.  Men may shave face and neck.  Do not bring valuables to the hospital.  [facility] is not responsible for any belongings or valuables.  Contacts, dentures or bridgework may not be worn into surgery.  Leave your suitcase in the car.  After surgery it may be brought to your room.  For patients admitted to the hospital, discharge time will be determined by your treatment team.  Patients discharged the day of surgery will not be allowed to drive home.   Name and phone number of your driver:    Special instructions:  Special Instructions:  - Preparing for Surgery  Before surgery, you can play an important role.  Because skin is not sterile, your skin needs to be as free of germs as possible.  You can reduce the number of germs on you skin by washing with CHG (chlorahexidine gluconate) soap before surgery.  CHG is an antiseptic cleaner which kills germs and bonds with the skin to continue killing germs even after washing.  Please DO NOT use if you have an allergy to CHG or antibacterial  soaps.  If your skin becomes reddened/irritated stop using the CHG and inform your nurse when you arrive at Short Stay.  Do not shave (including legs and underarms) for at least 48 hours prior to the first CHG shower.  You may shave your face.  Please follow these instructions carefully:   1.  Shower with CHG Soap the night before surgery and the morning of Surgery.  2.  If you choose to wash your hair, wash your hair first as usual with your normal shampoo.  3.  After you shampoo, rinse your hair and body thoroughly to remove the Shampoo.  4.  Use CHG as you would any other liquid soap.  You can apply chg directly  to the skin and wash gently with scrungie or a clean washcloth.  5.  Apply the CHG Soap to your body ONLY FROM THE NECK DOWN.  Do not use on open wounds or open sores.  Avoid contact with your eyes ears, mouth and genitals (private parts).  Wash genitals (private parts)       with your normal soap.  6.  Wash thoroughly, paying special attention to the area where your surgery will be performed.  7.  Thoroughly rinse your body with warm water from the neck down.  8.  DO NOT shower/wash with your normal soap after using and rinsing off the CHG Soap.  9.  Pat yourself dry with a clean towel.  10.  Wear clean pajamas.            11.  Place clean sheets on your bed the night of your first shower and do not sleep with pets.  Day of Surgery  Do not apply any lotions/deodorants the morning of surgery.  Please wear clean clothes to the hospital/surgery center.  Please read over the following fact sheets that you were given. Pain Booklet, Coughing and Deep Breathing and Surgical Site Infection Prevention

## 2015-05-23 ENCOUNTER — Ambulatory Visit (HOSPITAL_COMMUNITY): Payer: BLUE CROSS/BLUE SHIELD | Admitting: Surgery

## 2015-05-23 ENCOUNTER — Encounter (HOSPITAL_COMMUNITY): Payer: Self-pay

## 2015-05-23 ENCOUNTER — Observation Stay (HOSPITAL_COMMUNITY)
Admission: RE | Admit: 2015-05-23 | Discharge: 2015-05-24 | Disposition: A | Discharge status: Home / Self Care | Payer: BLUE CROSS/BLUE SHIELD | Source: Ambulatory Visit | Attending: General Surgery | Admitting: General Surgery

## 2015-05-23 ENCOUNTER — Encounter (HOSPITAL_COMMUNITY)
Admission: RE | Disposition: A | Discharge status: Home / Self Care | Payer: Self-pay | Source: Ambulatory Visit | Attending: General Surgery

## 2015-05-23 DIAGNOSIS — Z8719 Personal history of other diseases of the digestive system: Secondary | ICD-10-CM

## 2015-05-23 DIAGNOSIS — Z9889 Other specified postprocedural states: Secondary | ICD-10-CM

## 2015-05-23 DIAGNOSIS — K420 Umbilical hernia with obstruction, without gangrene: Secondary | ICD-10-CM | POA: Diagnosis not present

## 2015-05-23 HISTORY — PX: UMBILICAL HERNIA REPAIR: SHX196

## 2015-05-23 HISTORY — PX: INSERTION OF MESH: SHX5868

## 2015-05-23 LAB — GLUCOSE, CAPILLARY: GLUCOSE-CAPILLARY: 209 mg/dL — AB (ref 65–99)

## 2015-05-23 SURGERY — REPAIR, HERNIA, UMBILICAL, LAPAROSCOPIC
Anesthesia: General | Site: Abdomen

## 2015-05-23 MED ORDER — ROCURONIUM BROMIDE 100 MG/10ML IV SOLN
INTRAVENOUS | Status: DC | PRN
  Administered 2015-05-23: 10 mg via INTRAVENOUS
  Administered 2015-05-23: 30 mg via INTRAVENOUS
  Administered 2015-05-23: 10 mg via INTRAVENOUS

## 2015-05-23 MED ORDER — LIDOCAINE HCL (CARDIAC) 20 MG/ML IV SOLN
INTRAVENOUS | Status: DC | PRN
  Administered 2015-05-23: 70 mg via INTRAVENOUS

## 2015-05-23 MED ORDER — SUGAMMADEX SODIUM 200 MG/2ML IV SOLN
INTRAVENOUS | Status: DC | PRN
  Administered 2015-05-23: 200 mg via INTRAVENOUS

## 2015-05-23 MED ORDER — MIDAZOLAM HCL 2 MG/2ML IJ SOLN
INTRAMUSCULAR | Status: AC
  Filled 2015-05-23: qty 4

## 2015-05-23 MED ORDER — ACETAMINOPHEN 650 MG RE SUPP
650.0000 mg | RECTAL | Status: DC | PRN
  Filled 2015-05-23: qty 1

## 2015-05-23 MED ORDER — LIDOCAINE HCL 4 % MT SOLN
OROMUCOSAL | Status: DC | PRN
  Administered 2015-05-23: 10 mL via TOPICAL

## 2015-05-23 MED ORDER — ONDANSETRON HCL 4 MG/2ML IJ SOLN: 4.0000 mg | Freq: Four times a day (QID) | INTRAMUSCULAR | Status: DC | PRN

## 2015-05-23 MED ORDER — SODIUM CHLORIDE 0.9 % IV SOLN: 250.0000 mL | INTRAVENOUS | Status: DC | PRN

## 2015-05-23 MED ORDER — EPHEDRINE SULFATE 50 MG/ML IJ SOLN
INTRAMUSCULAR | Status: DC | PRN
  Administered 2015-05-23 (×2): 5 mg via INTRAVENOUS

## 2015-05-23 MED ORDER — CHLORHEXIDINE GLUCONATE 4 % EX LIQD: 1.0000 "application " | Freq: Once | CUTANEOUS | Status: DC

## 2015-05-23 MED ORDER — OXYCODONE HCL 5 MG PO TABS
5.0000 mg | ORAL_TABLET | ORAL | Status: DC | PRN
  Administered 2015-05-23 (×3): 10 mg via ORAL
  Filled 2015-05-23 (×3): qty 2

## 2015-05-23 MED ORDER — PHENYLEPHRINE HCL 10 MG/ML IJ SOLN
INTRAMUSCULAR | Status: DC | PRN
  Administered 2015-05-23: 80 ug via INTRAVENOUS
  Administered 2015-05-23: 40 ug via INTRAVENOUS
  Administered 2015-05-23: 80 ug via INTRAVENOUS

## 2015-05-23 MED ORDER — HYDROMORPHONE HCL 1 MG/ML IJ SOLN
0.2500 mg | INTRAMUSCULAR | Status: DC | PRN
  Administered 2015-05-23: 0.5 mg via INTRAVENOUS
  Administered 2015-05-23: 0.25 mg via INTRAVENOUS
  Administered 2015-05-23 (×2): 0.5 mg via INTRAVENOUS
  Administered 2015-05-23: 0.25 mg via INTRAVENOUS

## 2015-05-23 MED ORDER — HYDROMORPHONE HCL 1 MG/ML IJ SOLN
INTRAMUSCULAR | Status: AC
  Filled 2015-05-23: qty 1

## 2015-05-23 MED ORDER — ACETAMINOPHEN 325 MG PO TABS
650.0000 mg | ORAL_TABLET | ORAL | Status: DC | PRN
  Filled 2015-05-23: qty 2

## 2015-05-23 MED ORDER — MORPHINE SULFATE (PF) 2 MG/ML IV SOLN
2.0000 mg | INTRAVENOUS | Status: DC | PRN
  Filled 2015-05-23: qty 1

## 2015-05-23 MED ORDER — SUGAMMADEX SODIUM 200 MG/2ML IV SOLN
INTRAVENOUS | Status: AC
  Filled 2015-05-23: qty 2

## 2015-05-23 MED ORDER — FENTANYL CITRATE (PF) 100 MCG/2ML IJ SOLN
INTRAMUSCULAR | Status: DC | PRN
  Administered 2015-05-23 (×3): 50 ug via INTRAVENOUS
  Administered 2015-05-23: 100 ug via INTRAVENOUS

## 2015-05-23 MED ORDER — DEXAMETHASONE SODIUM PHOSPHATE 4 MG/ML IJ SOLN
INTRAMUSCULAR | Status: AC
  Filled 2015-05-23: qty 2

## 2015-05-23 MED ORDER — HYDRALAZINE HCL 20 MG/ML IJ SOLN: 10.0000 mg | INTRAMUSCULAR | Status: DC | PRN

## 2015-05-23 MED ORDER — HYDROMORPHONE HCL 1 MG/ML IJ SOLN: 1.0000 mg | INTRAMUSCULAR | Status: DC | PRN

## 2015-05-23 MED ORDER — OXYCODONE-ACETAMINOPHEN 5-325 MG PO TABS: 1.0000 | ORAL_TABLET | ORAL | Status: DC | PRN

## 2015-05-23 MED ORDER — BUPIVACAINE HCL 0.25 % IJ SOLN
INTRAMUSCULAR | Status: DC | PRN
  Administered 2015-05-23: 30 mL

## 2015-05-23 MED ORDER — FENTANYL CITRATE (PF) 250 MCG/5ML IJ SOLN
INTRAMUSCULAR | Status: AC
  Filled 2015-05-23: qty 5

## 2015-05-23 MED ORDER — LACTATED RINGERS IV SOLN
INTRAVENOUS | Status: DC | PRN
  Administered 2015-05-23 (×2): via INTRAVENOUS

## 2015-05-23 MED ORDER — SODIUM CHLORIDE 0.9 % IJ SOLN: 3.0000 mL | INTRAMUSCULAR | Status: DC | PRN

## 2015-05-23 MED ORDER — OXYCODONE HCL 5 MG PO TABS
ORAL_TABLET | ORAL | Status: AC
  Filled 2015-05-23: qty 2

## 2015-05-23 MED ORDER — ONDANSETRON HCL 4 MG/2ML IJ SOLN: 4.0000 mg | Freq: Once | INTRAMUSCULAR | Status: DC | PRN

## 2015-05-23 MED ORDER — PROPOFOL 10 MG/ML IV BOLUS
INTRAVENOUS | Status: AC
  Filled 2015-05-23: qty 20

## 2015-05-23 MED ORDER — BUPIVACAINE HCL (PF) 0.25 % IJ SOLN
INTRAMUSCULAR | Status: AC
  Filled 2015-05-23: qty 30

## 2015-05-23 MED ORDER — ONDANSETRON HCL 4 MG/2ML IJ SOLN
INTRAMUSCULAR | Status: DC | PRN
  Administered 2015-05-23: 4 mg via INTRAVENOUS

## 2015-05-23 MED ORDER — ACETAMINOPHEN 10 MG/ML IV SOLN
1000.0000 mg | Freq: Once | INTRAVENOUS | Status: AC
  Administered 2015-05-23: 1000 mg via INTRAVENOUS
  Filled 2015-05-23: qty 100

## 2015-05-23 MED ORDER — HYDROCODONE-ACETAMINOPHEN 5-325 MG PO TABS
1.0000 | ORAL_TABLET | ORAL | Status: DC | PRN
  Administered 2015-05-24: 2 via ORAL
  Filled 2015-05-23: qty 2

## 2015-05-23 MED ORDER — SODIUM CHLORIDE 0.9 % IJ SOLN
3.0000 mL | Freq: Two times a day (BID) | INTRAMUSCULAR | Status: DC
  Administered 2015-05-23: 3 mL via INTRAVENOUS

## 2015-05-23 MED ORDER — CEFAZOLIN SODIUM-DEXTROSE 2-3 GM-% IV SOLR
2.0000 g | INTRAVENOUS | Status: AC
  Administered 2015-05-23: 2 g via INTRAVENOUS
  Filled 2015-05-23: qty 50

## 2015-05-23 MED ORDER — 0.9 % SODIUM CHLORIDE (POUR BTL) OPTIME
TOPICAL | Status: DC | PRN
  Administered 2015-05-23: 1000 mL

## 2015-05-23 MED ORDER — DEXAMETHASONE SODIUM PHOSPHATE 4 MG/ML IJ SOLN
INTRAMUSCULAR | Status: DC | PRN
  Administered 2015-05-23: 8 mg via INTRAVENOUS

## 2015-05-23 MED ORDER — ONDANSETRON 4 MG PO TBDP: 4.0000 mg | ORAL_TABLET | Freq: Four times a day (QID) | ORAL | Status: DC | PRN

## 2015-05-23 MED ORDER — ALBUTEROL SULFATE HFA 108 (90 BASE) MCG/ACT IN AERS
INHALATION_SPRAY | RESPIRATORY_TRACT | Status: DC | PRN
  Administered 2015-05-23: 6 via RESPIRATORY_TRACT

## 2015-05-23 MED ORDER — PROPOFOL 10 MG/ML IV BOLUS
INTRAVENOUS | Status: DC | PRN
  Administered 2015-05-23: 200 mg via INTRAVENOUS

## 2015-05-23 SURGICAL SUPPLY — 48 items
APL SKNCLS STERI-STRIP NONHPOA (GAUZE/BANDAGES/DRESSINGS) ×1
APPLIER CLIP LOGIC TI 5 (MISCELLANEOUS) IMPLANT
APPLIER CLIP ROT 10 11.4 M/L (STAPLE)
APR CLP MED LRG 11.4X10 (STAPLE)
APR CLP MED LRG 33X5 (MISCELLANEOUS)
BENZOIN TINCTURE PRP APPL 2/3 (GAUZE/BANDAGES/DRESSINGS) ×2 IMPLANT
BLADE SURG ROTATE 9660 (MISCELLANEOUS) IMPLANT
CANISTER SUCTION 2500CC (MISCELLANEOUS) IMPLANT
CHLORAPREP W/TINT 26ML (MISCELLANEOUS) ×2 IMPLANT
CLIP APPLIE ROT 10 11.4 M/L (STAPLE) IMPLANT
COVER SURGICAL LIGHT HANDLE (MISCELLANEOUS) ×2 IMPLANT
DEVICE SECURE STRAP 25 ABSORB (INSTRUMENTS) ×4 IMPLANT
DEVICE TROCAR PUNCTURE CLOSURE (ENDOMECHANICALS) ×2 IMPLANT
ELECT REM PT RETURN 9FT ADLT (ELECTROSURGICAL) ×2
ELECTRODE REM PT RTRN 9FT ADLT (ELECTROSURGICAL) ×1 IMPLANT
GAUZE SPONGE 2X2 8PLY STRL LF (GAUZE/BANDAGES/DRESSINGS) ×1 IMPLANT
GAUZE SPONGE 4X4 12PLY STRL (GAUZE/BANDAGES/DRESSINGS) ×2 IMPLANT
GLOVE BIO SURGEON STRL SZ7.5 (GLOVE) ×2 IMPLANT
GLOVE SURG SS PI 6.5 STRL IVOR (GLOVE) ×2 IMPLANT
GOWN STRL REUS W/ TWL LRG LVL3 (GOWN DISPOSABLE) ×2 IMPLANT
GOWN STRL REUS W/ TWL XL LVL3 (GOWN DISPOSABLE) ×1 IMPLANT
GOWN STRL REUS W/TWL LRG LVL3 (GOWN DISPOSABLE) ×4
GOWN STRL REUS W/TWL XL LVL3 (GOWN DISPOSABLE) ×1
KIT BASIN OR (CUSTOM PROCEDURE TRAY) ×2 IMPLANT
KIT ROOM TURNOVER OR (KITS) ×2 IMPLANT
MARKER SKIN DUAL TIP RULER LAB (MISCELLANEOUS) ×2 IMPLANT
MESH VENTRALIGHT ST 6IN CRC (Mesh General) ×2 IMPLANT
NEEDLE INSUFFLATION 14GA 120MM (NEEDLE) ×2 IMPLANT
NEEDLE SPNL 22GX3.5 QUINCKE BK (NEEDLE) IMPLANT
NS IRRIG 1000ML POUR BTL (IV SOLUTION) ×2 IMPLANT
PAD ARMBOARD 7.5X6 YLW CONV (MISCELLANEOUS) ×4 IMPLANT
SCISSORS LAP 5X35 DISP (ENDOMECHANICALS) ×2 IMPLANT
SET IRRIG TUBING LAPAROSCOPIC (IRRIGATION / IRRIGATOR) IMPLANT
SLEEVE ENDOPATH XCEL 5M (ENDOMECHANICALS) ×4 IMPLANT
SPONGE GAUZE 2X2 STER 10/PKG (GAUZE/BANDAGES/DRESSINGS) ×1
STRIP CLOSURE SKIN 1/2X4 (GAUZE/BANDAGES/DRESSINGS) ×2 IMPLANT
SUT CHROMIC 2 0 SH (SUTURE) ×2 IMPLANT
SUT ETHIBOND NAB CT1 #1 30IN (SUTURE) ×4 IMPLANT
SUT MNCRL AB 4-0 PS2 18 (SUTURE) ×2 IMPLANT
SUT PROLENE 2 0 KS (SUTURE) ×2 IMPLANT
TOWEL OR 17X24 6PK STRL BLUE (TOWEL DISPOSABLE) ×2 IMPLANT
TOWEL OR 17X26 10 PK STRL BLUE (TOWEL DISPOSABLE) ×2 IMPLANT
TRAY FOLEY CATH 14FR (SET/KITS/TRAYS/PACK) IMPLANT
TRAY LAPAROSCOPIC MC (CUSTOM PROCEDURE TRAY) ×2 IMPLANT
TROCAR XCEL BLUNT TIP 100MML (ENDOMECHANICALS) IMPLANT
TROCAR XCEL NON-BLD 11X100MML (ENDOMECHANICALS) IMPLANT
TROCAR XCEL NON-BLD 5MMX100MML (ENDOMECHANICALS) ×2 IMPLANT
TUBING INSUFFLATION (TUBING) ×2 IMPLANT

## 2015-05-23 NOTE — Anesthesia Preprocedure Evaluation (Addendum)
Anesthesia Evaluation  Patient identified by MRN, date of birth, ID band Patient awake    Airway Mallampati: II  TM Distance: >3 FB     Dental no notable dental hx. (+) Teeth Intact   Pulmonary asthma , sleep apnea ,    Pulmonary exam normal        Cardiovascular  Rhythm:Regular Rate:Normal     Neuro/Psych    GI/Hepatic GERD  ,  Endo/Other    Renal/GU      Musculoskeletal  (+) Arthritis ,   Abdominal   Peds  Hematology   Anesthesia Other Findings   Reproductive/Obstetrics                            Anesthesia Physical Anesthesia Plan  ASA: II  Anesthesia Plan: General   Post-op Pain Management:    Induction: Intravenous  Airway Management Planned: Oral ETT  Additional Equipment:   Intra-op Plan:   Post-operative Plan: Extubation in OR  Informed Consent: I have reviewed the patients History and Physical, chart, labs and discussed the procedure including the risks, benefits and alternatives for the proposed anesthesia with the patient or authorized representative who has indicated his/her understanding and acceptance.     Plan Discussed with: CRNA, Anesthesiologist and Surgeon  Anesthesia Plan Comments:         Anesthesia Quick Evaluation

## 2015-05-23 NOTE — Transfer of Care (Signed)
Immediate Anesthesia Transfer of Care Note  Patient: Clayton [name]  Procedure(s) Performed: Procedure(s): LAPAROSCOPIC UMBILICAL HERNIA REPAIR  (N/A) INSERTION OF MESH (N/A)  Patient Location: PACU  Anesthesia Type:General  Level of Consciousness: awake, alert , oriented and patient cooperative  Airway & Oxygen Therapy: Patient Spontanous Breathing and Patient connected to nasal cannula oxygen  Post-op Assessment: Report given to RN, Post -op Vital signs reviewed and stable and Patient moving all extremities  Post vital signs: Reviewed and stable  Last Vitals:  Filed Vitals:   05/23/15 0621  BP: 161/98  Pulse: 79  Temp: 37 C    Complications: No apparent anesthesia complications

## 2015-05-23 NOTE — H&P (View-Only) (Signed)
History of Present Illness [name] MD; 04/24/2015 9:16 AM) Patient words: evaluate umbilical hernia.  The patient is a 59 year old male who presents with an umbilical hernia. The patient is a 59 year old male who is referred by Dr. Kathyrn Lass for evaluation of an umbilical hernia. Patient states that the hernia is been there for approximately a year. He states it become more symptomatic over the last several months. He is able to self reduce the hernia. He's had no signs or symptoms of incarceration or strangulation. The patient has borderline hypertension diabetes and is being followed by Dr. Kathyrn Lass.   Other Problems Ivor Costa, [place]; 04/24/2015 8:56 AM) Kidney Joaquim Lai Sleep Apnea Umbilical Hernia Repair  Past Surgical History Ivor Costa, [place]; 04/24/2015 8:56 AM) No pertinent past surgical history  Diagnostic Studies History Ivor Costa, [place]; 04/24/2015 8:56 AM) Colonoscopy 1-5 years ago  Medication History Ivor Costa, CMA; 04/24/2015 8:59 AM) Albuterol Sulfate HFA (108 (90 Base)MCG/ACT Aerosol Soln, Inhalation) Active. Vitamin D3 (5000UNIT Tablet, Oral) Active. Ibuprofen (200MG  Tablet, Oral) Active. Medications Reconciled  Social History Ivor Costa, [place]; 04/24/2015 8:56 AM) Alcohol use Occasional alcohol use. Caffeine use Carbonated beverages, Coffee, Tea. No drug use Tobacco use Never smoker.  Family History Ivor Costa, [place]; 04/24/2015 8:56 AM) Arthritis Brother, Father, Mother. Diabetes Mellitus Father. Heart Disease Father. Hypertension Father. Respiratory Condition Mother. Thyroid problems Mother.  Review of Systems Ivor Costa CMA; 04/24/2015 8:56 AM) General Not Present- Appetite Loss, Chills, Fatigue, Fever, Night Sweats, Weight Gain and Weight Loss. Skin Present- Change in Wart/Mole. Not Present- Dryness, Hives, Jaundice, New Lesions, Non-Healing Wounds, Rash and Ulcer. HEENT Not Present- Earache, Hearing Loss,  Hoarseness, Nose Bleed, Oral Ulcers, Ringing in the Ears, Seasonal Allergies, Sinus Pain, Sore Throat, Visual Disturbances, Wears glasses/contact lenses and Yellow Eyes. Respiratory Present- Snoring. Not Present- Bloody sputum, Chronic Cough, Difficulty Breathing and Wheezing. Breast Not Present- Breast Mass, Breast Pain, Nipple Discharge and Skin Changes. Cardiovascular Not Present- Chest Pain, Difficulty Breathing Lying Down, Leg Cramps, Palpitations, Rapid Heart Rate, Shortness of Breath and Swelling of Extremities. Gastrointestinal Not Present- Abdominal Pain, Bloating, Bloody Stool, Change in Bowel Habits, Chronic diarrhea, Constipation, Difficulty Swallowing, Excessive gas, Gets full quickly at meals, Hemorrhoids, Indigestion, Nausea, Rectal Pain and Vomiting. Male Genitourinary Not Present- Blood in Urine, Change in Urinary Stream, Frequency, Impotence, Nocturia, Painful Urination, Urgency and Urine Leakage. Musculoskeletal Not Present- Back Pain, Joint Pain, Joint Stiffness, Muscle Pain, Muscle Weakness and Swelling of Extremities. Neurological Not Present- Decreased Memory, Fainting, Headaches, Numbness, Seizures, Tingling, Tremor, Trouble walking and Weakness. Psychiatric Not Present- Anxiety, Bipolar, Change in Sleep Pattern, Depression, Fearful and Frequent crying. Endocrine Not Present- Cold Intolerance, Excessive Hunger, Hair Changes, Heat Intolerance, Hot flashes and New Diabetes. Hematology Not Present- Easy Bruising, Excessive bleeding, Gland problems, HIV and Persistent Infections.   Vitals Ivor Costa CMA; 04/24/2015 8:56 AM) 04/24/2015 8:56 AM Weight: 245.6 lb Height: 69in Body Surface Area: 2.33 m Body Mass Index: 36.27 kg/m Temp.: 75F(Temporal)  Pulse: 72 (Regular)  Resp.: 16 (Unlabored)  BP: 142/88 (Sitting, Left Arm, Standard)    Physical Exam [name] MD; 04/24/2015 9:16 AM) General Mental Status-Alert. General Appearance-Consistent with  stated age. Hydration-Well hydrated. Voice-Normal.  Head and Neck Head-normocephalic, atraumatic with no lesions or palpable masses. Trachea-midline. Thyroid Gland Characteristics - normal size and consistency.  Chest and Lung Exam Chest and lung exam reveals -quiet, even and easy respiratory effort with no use of accessory muscles and on auscultation, normal breath sounds, no adventitious sounds  and normal vocal resonance. Inspection Chest Wall - Normal. Back - normal.  Cardiovascular Cardiovascular examination reveals -normal heart sounds, regular rate and rhythm with no murmurs and normal pedal pulses bilaterally.  Abdomen Inspection Skin - Scar - no surgical scars. Hernias - Umbilical hernia - Reducible(Approximately 2 cm umbilical hernia approximately 2 cm umbilical hernia). Palpation/Percussion Normal exam - Soft, Non Tender, No Rebound tenderness, No Rigidity (guarding) and No hepatosplenomegaly. Auscultation Normal exam - Bowel sounds normal.    Assessment & Plan [name] MD; 1/73/5670 1:41 AM) UMBILICAL HERNIA WITHOUT OBSTRUCTION AND WITHOUT GANGRENE (K42.9) Impression: 59 year old male with a primary umbilical hernia.  1. The patient would like to proceed to the operative for laparoscopic umbilical hernia. Mesh 2. All risks and benefits were discussed with the patient to generally include, but not limited to: infection, bleeding, damage to surrounding structures, acute and chronic nerve pain, and recurrence. Alternatives were offered and described. All questions were answered and the patient voiced understanding of the procedure and wishes to proceed at this point with hernia repair.

## 2015-05-23 NOTE — Anesthesia Postprocedure Evaluation (Signed)
  Anesthesia Post-op Note  Patient: Clayton [name]  Procedure(s) Performed: Procedure(s): LAPAROSCOPIC UMBILICAL HERNIA REPAIR  (N/A) INSERTION OF MESH (N/A)  Patient Location: PACU  Anesthesia Type:General  Level of Consciousness: awake, alert , oriented and patient cooperative  Airway and Oxygen Therapy: Patient Spontanous Breathing  Post-op Pain: mild  Post-op Assessment: Post-op Vital signs reviewed, Patient's Cardiovascular Status Stable, Respiratory Function Stable, Patent Airway, No signs of Nausea or vomiting and Pain level controlled              Post-op Vital Signs: stable  Last Vitals:  Filed Vitals:   05/23/15 0851  BP: 132/88  Pulse: 86  Temp:   Resp: 18    Complications: No apparent anesthesia complications

## 2015-05-23 NOTE — Progress Notes (Signed)
Client not complaining of pain but when client stood at bedside c/o over 10/10 pain and Dr Rosendo Gros notified and will admit

## 2015-05-23 NOTE — Progress Notes (Signed)
Spoke with Tonya in short stay --now waiting on them to call us back with bay assignment

## 2015-05-23 NOTE — Discharge Instructions (Signed)
[facility] _______[facility] ° °UMBILICAL HERNIA REPAIR: POST OP INSTRUCTIONS ° °Always review your discharge instruction sheet given to you by the facility where your surgery was performed. °IF YOU HAVE DISABILITY OR FAMILY LEAVE FORMS, YOU MUST BRING THEM TO THE OFFICE FOR PROCESSING.   °DO NOT GIVE THEM TO YOUR DOCTOR. ° °1. A  prescription for pain medication may be given to you upon discharge.  Take your pain medication as prescribed, if needed.  If narcotic pain medicine is not needed, then you may take acetaminophen (Tylenol) or ibuprofen (Advil) as needed. °2. Take your usually prescribed medications unless otherwise directed. °3. If you need a refill on your pain medication, please contact your pharmacy.  They will contact our office to request authorization. Prescriptions will not be filled after 5 pm or on week-ends. °4. You should follow a light diet the first 24 hours after arrival home, such as soup and crackers, etc.  Be sure to include lots of fluids daily.  Resume your normal diet the day after surgery. °5. Most patients will experience some swelling and bruising around the umbilicus or in the groin and scrotum.  Ice packs and reclining will help.  Swelling and bruising can take several days to resolve.  °6. It is common to experience some constipation if taking pain medication after surgery.  Increasing fluid intake and taking a stool softener (such as Colace) will usually help or prevent this problem from occurring.  A mild laxative (Milk of Magnesia or Miralax) should be taken according to package directions if there are no bowel movements after 48 hours. °7. Unless discharge instructions indicate otherwise, you may remove your bandages 24-48 hours after surgery, and you may shower at that time.  You may have steri-strips (small skin tapes) in place directly over the incision.  These strips should be left on the skin for 7-10 days.  If your surgeon used skin glue on the incision, you  may shower in 24 hours.  The glue will flake off over the next 2-3 weeks.  Any sutures or staples will be removed at the office during your follow-up visit. °8. ACTIVITIES:  You may resume regular (light) daily activities beginning the next day--such as daily self-care, walking, climbing stairs--gradually increasing activities as tolerated.  You may have sexual intercourse when it is comfortable.  Refrain from any heavy lifting or straining until approved by your doctor. °a. You may drive when you are no longer taking prescription pain medication, you can comfortably wear a seatbelt, and you can safely maneuver your car and apply brakes. °b. RETURN TO WORK:  __________________________________________________________ °9. You should see your doctor in the office for a follow-up appointment approximately 2-3 weeks after your surgery.  Make sure that you call for this appointment within a day or two after you arrive home to insure a convenient appointment time. °10. OTHER INSTRUCTIONS:  __________________________________________________________________________________________________________________________________________________________________________________________  °WHEN TO CALL YOUR DOCTOR: °1. Fever over 101.0 °2. Inability to urinate °3. Nausea and/or vomiting °4. Extreme swelling or bruising °5. Continued bleeding from incision. °6. Increased pain, redness, or drainage from the incision ° °The clinic staff is available to answer your questions during regular business hours.  Please don’t hesitate to call and ask to speak to one of the nurses for clinical concerns.  If you have a medical emergency, go to the nearest emergency room or call 911.  A surgeon from [facility] is always on call at the hospital ° ° °[address], [address], [place], [place]  [address] ? ° [address] [address], ,    [address] °[phone] ? [phone] ? FAX [phone] °Web site:  [email or web address] ° °

## 2015-05-23 NOTE — Progress Notes (Signed)
Family updated per [place] NT--still working on pain control.

## 2015-05-23 NOTE — Interval H&P Note (Signed)
History and Physical Interval Note:  05/23/2015 7:16 AM  Clayton [name]  has presented today for surgery, with the diagnosis of UMBILICAL HERNIA  The various methods of treatment have been discussed with the patient and family. After consideration of risks, benefits and other options for treatment, the patient has consented to  Procedure(s): [place] (N/A) INSERTION OF MESH (N/A) as a surgical intervention .  The patient's history has been reviewed, patient examined, no change in status, stable for surgery.  I have reviewed the patient's chart and labs.  Questions were answered to the patient's satisfaction.     Rosario Jacks., Anne Hahn

## 2015-05-23 NOTE — Addendum Note (Signed)
Addendum  created 05/23/15 0951 by Glynda Jaeger, CRNA   Modules edited: Anesthesia Medication Administration

## 2015-05-23 NOTE — Anesthesia Procedure Notes (Signed)
Procedure Name: Intubation Date/Time: 05/23/2015 7:38 AM Performed by: Santa Genera Pre-anesthesia Checklist: Patient identified, Emergency Drugs available, Suction available, Patient being monitored and Timeout performed Patient Re-evaluated:Patient Re-evaluated prior to inductionOxygen Delivery Method: Circle system utilized Preoxygenation: Pre-oxygenation with 100% oxygen Intubation Type: IV induction Laryngoscope Size: Mac and 3 Grade View: Grade I Tube type: Oral Tube size: 7.5 mm Number of attempts: 1 Airway Equipment and Method: Stylet Placement Confirmation: ETT inserted through vocal cords under direct vision,  positive ETCO2 and breath sounds checked- equal and bilateral Secured at: 21 cm Tube secured with: Tape Dental Injury: Teeth and Oropharynx as per pre-operative assessment

## 2015-05-23 NOTE — Op Note (Signed)
05/23/2015  8:40 AM  PATIENT:  Clayton [name]  59 y.o. male  PRE-OPERATIVE DIAGNOSIS:  UMBILICAL HERNIA  POST-OPERATIVE DIAGNOSIS:  INCARCERATED UMBILICAL HERNIA  PROCEDURE:  Procedure(s): LAPAROSCOPIC UMBILICAL HERNIA REPAIR  (N/A) INSERTION OF MESH (N/A)  SURGEON:  Surgeon(s) and Role:    * [name], MD - Primary}   ANESTHESIA:   local and general  EBL:  Total I/O In: 1000 [I.V.:1000] Out: -   BLOOD ADMINISTERED:none  DRAINS: none   LOCAL MEDICATIONS USED:  BUPIVICAINE   SPECIMEN:  No Specimen  DISPOSITION OF SPECIMEN:  N/A  COUNTS:  YES  TOURNIQUET:  * No tourniquets in log *  DICTATION: .Dragon Dictation  Details of the procedure:   After the patient was consented patient was taken back to the operating room patient was then placed in supine position bilateral SCDs in place.  The patient was prepped and draped in the usual sterile fashion. After antibiotics were confirmed a timeout was called and all facts were verified. The Veress needle technique was used to insuflate the abdomen at Palmer's point. The abdomen was insufflated to 14 mm mercury. Subsequently a 5 mm trocar was placed a camera inserted there was no injury to any intra-abdominal organs.    There was seen to be an incarcerated  2cm umbilical hernia.  A second camera port was in placed into the left lower quadrant.   At this the Falicform ligament was taken down with Bovie cautery maintaining hemostasis. A 57mm port was placed in the epigastrium .   I proceeded to reduce the hernia contents.  Once the hernia was cleared away, a Bard Ventralight 15.2cm  mesh was inserted into the abdomen.  The mesh was secured circumferentially with am Securestrap tacker in a double crown fashion.  2-0 prolenes were used at 3:00, 6:00, 9:00, and 12:00 as transfascial sutures using a endoclose decive.    The omentum was brought over the area of the mesh. The pneumoperitoneum was evacuated  & all trocars  were  removed. The skin was reapproximated with 4-0  Monocryl sutures in a subcuticular fashion. The skin was dressed with Steri-Strips tape and gauze.  The patient was taken to the recovery room in stable condition.   PLAN OF CARE: Discharge to home after PACU  PATIENT DISPOSITION:  PACU - hemodynamically stable.   Delay start of Pharmacological VTE agent (>24hrs) due to surgical blood loss or risk of bleeding: not applicable

## 2015-05-24 ENCOUNTER — Encounter (HOSPITAL_COMMUNITY): Payer: Self-pay | Admitting: General Surgery

## 2015-05-24 DIAGNOSIS — K420 Umbilical hernia with obstruction, without gangrene: Secondary | ICD-10-CM | POA: Diagnosis not present

## 2015-05-24 NOTE — Progress Notes (Signed)
1 Day Post-Op  Subjective: Pt doing well this AM.  Feels less pain. Hasn't gotten up to move around this AM  Objective: Vital signs in last 24 hours: Temp:  [96.8 F (36 C)-98.6 F (37 C)] 96.8 F (36 C) (10/19 0548) Pulse Rate:  [69-91] 78 (10/19 0548) Resp:  [9-20] 20 (10/19 0548) BP: (121-156)/(64-90) 130/78 mmHg (10/19 0548) SpO2:  [88 %-99 %] 92 % (10/19 0548)    Intake/Output from previous day: 10/18 0701 - 10/19 0700 In: 2350 [P.O.:350; I.V.:2000] Out: 820 [Urine:800; Blood:20] Intake/Output this shift:    General appearance: alert and cooperative GI: soft, non-tender; bowel sounds normal; no masses,  no organomegaly and wound c/d/i   Assessment/Plan: s/p Procedure(s): LAPAROSCOPIC UMBILICAL HERNIA REPAIR  (N/A) INSERTION OF MESH (N/A) Discharge  Mobilize     Rosario Jacks., Anne Hahn 05/24/2015

## 2015-05-24 NOTE — Progress Notes (Signed)
AVS given to patient.  Patient ambulated around the unit comfortably with a walker and his wife.  Understanding demonstrated verbally of discharge instructions by patient. Belongings packed. IV removed. Transportation provided by his family.

## 2015-05-24 NOTE — Discharge Summary (Signed)
Physician Discharge Summary  Patient ID: Clayton [name] MRN: [id number] DOB/AGE: May 01, 1956 59 y.o.  Admit date: 05/23/2015 Discharge date: 05/24/2015  Admission Diagnoses: s/p Lap VHR with mesh  Discharge Diagnoses:  Active Problems:   S/P hernia repair   Discharged Condition: good  Hospital Course: 59 y/o M s/p Lap VHR with mesh.  Pt with pain and admitted 2/2 to pain.  He had good IV pain control while in the hospital.  tol PO well.  Was ambulating well.  He was deemed stable for DC and Dc'd home.  Consults: None  Significant Diagnostic Studies: none  Treatments: surgery: as above  Discharge Exam: Blood pressure 130/78, pulse 78, temperature 96.8 F (36 C), temperature source Oral, resp. rate 20, height 5\' 9"  (1.753 m), weight 111.585 kg (246 lb), SpO2 92 %. General appearance: alert and cooperative GI: soft, non-tender; bowel sounds normal; no masses,  no organomegaly wounds c/d/i  Disposition: 01-Home or Self Care  Discharge Instructions    Diet - low sodium heart healthy    Complete by:  As directed      Increase activity slowly    Complete by:  As directed             Medication List    TAKE these medications        albuterol 108 (90 BASE) MCG/ACT inhaler  Commonly known as:  PROVENTIL HFA;VENTOLIN HFA  Inhale 2 puffs into the lungs every 4 (four) hours as needed for wheezing or shortness of breath.     fluticasone 50 MCG/ACT nasal spray  Commonly known as:  FLONASE  Place 2 sprays into both nostrils daily as needed.     ibuprofen 200 MG tablet  Commonly known as:  ADVIL,MOTRIN  Take 400-600 mg by mouth every 6 (six) hours as needed for headache or moderate pain.     oxyCODONE-acetaminophen 5-325 MG tablet  Commonly known as:  ROXICET  Take 1-2 tablets by mouth every 4 (four) hours as needed.     Vitamin D3 5000 UNITS Caps  Take 1 capsule by mouth daily as needed.           Follow-up Information    Follow up with Reyes Ivan, MD In 3  weeks.   Specialty:  General Surgery   Why:  For wound re-check   Contact information:   [address] [place] [address] [phone]       Signed: Rosario Jacks., [facility] 05/24/2015, 8:36 AM

## 2015-08-04 ENCOUNTER — Ambulatory Visit (INDEPENDENT_AMBULATORY_CARE_PROVIDER_SITE_OTHER): Payer: BLUE CROSS/BLUE SHIELD | Admitting: Urgent Care

## 2015-08-04 ENCOUNTER — Ambulatory Visit (INDEPENDENT_AMBULATORY_CARE_PROVIDER_SITE_OTHER): Payer: BLUE CROSS/BLUE SHIELD

## 2015-08-04 VITALS — BP 167/92 | HR 80 | Temp 97.8°F | Resp 20 | Ht 68.0 in | Wt 242.2 lb

## 2015-08-04 DIAGNOSIS — S61012A Laceration without foreign body of left thumb without damage to nail, initial encounter: Secondary | ICD-10-CM | POA: Diagnosis not present

## 2015-08-04 DIAGNOSIS — M79645 Pain in left finger(s): Secondary | ICD-10-CM

## 2015-08-04 DIAGNOSIS — Z23 Encounter for immunization: Secondary | ICD-10-CM

## 2015-08-04 DIAGNOSIS — E669 Obesity, unspecified: Secondary | ICD-10-CM | POA: Diagnosis not present

## 2015-08-04 DIAGNOSIS — R030 Elevated blood-pressure reading, without diagnosis of hypertension: Secondary | ICD-10-CM

## 2015-08-04 MED ORDER — TRAMADOL HCL 50 MG PO TABS: 50.0000 mg | ORAL_TABLET | Freq: Three times a day (TID) | ORAL | Status: DC | PRN

## 2015-08-04 NOTE — Patient Instructions (Addendum)
- Please remove dressing in 48-72 hours. Apply new dressing thereafter or change the dressing sooner if it soaks through.  Laceration Care, Adult A laceration is a cut that goes through all of the layers of the skin and into the tissue that is right under the skin. Some lacerations heal on their own. Others need to be closed with stitches (sutures), staples, skin adhesive strips, or skin glue. Proper laceration care minimizes the risk of infection and helps the laceration to heal better. HOW TO CARE FOR YOUR LACERATION If sutures or staples were used:  Keep the wound clean and dry.  If you were given a bandage (dressing), you should change it at least one time per day or as told by your health care provider. You should also change it if it becomes wet or dirty.  Keep the wound completely dry for the first 24 hours or as told by your health care provider. After that time, you may shower or bathe. However, make sure that the wound is not soaked in water until after the sutures or staples have been removed.  Clean the wound one time each day or as told by your health care provider:  Wash the wound with soap and water.  Rinse the wound with water to remove all soap.  Pat the wound dry with a clean towel. Do not rub the wound.  After cleaning the wound, apply a thin layer of antibiotic ointmentas told by your health care provider. This will help to prevent infection and keep the dressing from sticking to the wound.  Have the sutures or staples removed as told by your health care provider. If skin adhesive strips were used:  Keep the wound clean and dry.  If you were given a bandage (dressing), you should change it at least one time per day or as told by your health care provider. You should also change it if it becomes dirty or wet.  Do not get the skin adhesive strips wet. You may shower or bathe, but be careful to keep the wound dry.  If the wound gets wet, pat it dry with a clean  towel. Do not rub the wound.  Skin adhesive strips fall off on their own. You may trim the strips as the wound heals. Do not remove skin adhesive strips that are still stuck to the wound. They will fall off in time. If skin glue was used:  Try to keep the wound dry, but you may briefly wet it in the shower or bath. Do not soak the wound in water, such as by swimming.  After you have showered or bathed, gently pat the wound dry with a clean towel. Do not rub the wound.  Do not do any activities that will make you sweat heavily until the skin glue has fallen off on its own.  Do not apply liquid, cream, or ointment medicine to the wound while the skin glue is in place. Using those may loosen the film before the wound has healed.  If you were given a bandage (dressing), you should change it at least one time per day or as told by your health care provider. You should also change it if it becomes dirty or wet.  If a dressing is placed over the wound, be careful not to apply tape directly over the skin glue. Doing that may cause the glue to be pulled off before the wound has healed.  Do not pick at the glue. The skin glue  usually remains in place for 5-10 days, then it falls off of the skin. General Instructions  Take over-the-counter and prescription medicines only as told by your health care provider.  If you were prescribed an antibiotic medicine or ointment, take or apply it as told by your doctor. Do not stop using it even if your condition improves.  To help prevent scarring, make sure to cover your wound with sunscreen whenever you are outside after stitches are removed, after adhesive strips are removed, or when glue remains in place and the wound is healed. Make sure to wear a sunscreen of at least 30 SPF.  Do not scratch or pick at the wound.  Keep all follow-up visits as told by your health care provider. This is important.  Check your wound every day for signs of infection. Watch  for:  Redness, swelling, or pain.  Fluid, blood, or pus.  Raise (elevate) the injured area above the level of your heart while you are sitting or lying down, if possible. SEEK MEDICAL CARE IF:  You received a tetanus shot and you have swelling, severe pain, redness, or bleeding at the injection site.  You have a fever.  A wound that was closed breaks open.  You notice a bad smell coming from your wound or your dressing.  You notice something coming out of the wound, such as wood or glass.  Your pain is not controlled with medicine.  You have increased redness, swelling, or pain at the site of your wound.  You have fluid, blood, or pus coming from your wound.  You notice a change in the color of your skin near your wound.  You need to change the dressing frequently due to fluid, blood, or pus draining from the wound.  You develop a new rash.  You develop numbness around the wound. SEEK IMMEDIATE MEDICAL CARE IF:  You develop severe swelling around the wound.  Your pain suddenly increases and is severe.  You develop painful lumps near the wound or on skin that is anywhere on your body.  You have a red streak going away from your wound.  The wound is on your hand or foot and you cannot properly move a finger or toe.  The wound is on your hand or foot and you notice that your fingers or toes look pale or bluish.   This information is not intended to replace advice given to you by your health care provider. Make sure you discuss any questions you have with your health care provider.   Document Released: 07/22/2005 Document Revised: 12/06/2014 Document Reviewed: 07/18/2014 Elsevier Interactive Patient Education [employer].

## 2015-08-04 NOTE — Progress Notes (Signed)
    MRN: [id number] DOB: 10-22-1955  Subjective:   Clayton [name] is a 59 y.o. male presenting for chief complaint of Laceration  Reports left thumb laceration today with a table saw. Patient states that he pulled his hand back immediately. He bled profusely and felt severe pain. He did not wash his hand, wrapped it and came to our clinic. He cannot recall when he had his last TDAP. He denies swelling, bony deformity, loss of ROM, decreased sensation. States that his last TDAP was >10 years ago. Denies history of hypertension but has had elevated BP readings in the past year.  [place] currently has no medications in their medication list. Also has No Known Allergies.  [place]  has a past medical history of Obesity; Cyst; Vitamin D deficiency; Hypertrophic condition of skin; Atrophic condition of skin; Cellulitis/abscess - trunk; Lipoma; Sleep apnea; Asthma; Pneumonia; Stones in the urinary tract; and Arthritis. Also  has past surgical history that includes NASAL SINUS SURGERY---UVULA REMOVED (2004); cyst removed from tailbone (1980); Mass excision (07/15/11); Prostate biopsy (9/16); Tonsillectomy; Umbilical hernia repair (N/A, 05/23/2015); and Insertion of mesh (N/A, 05/23/2015).  Objective:   Vitals: BP 167/92 mmHg  Pulse 80  Temp(Src) 97.8 F (36.6 C) (Oral)  Resp 20  Ht 5\' 8"  (1.727 m)  Wt 242 lb 3.2 oz (109.861 kg)  BMI 36.83 kg/m2  SpO2 98%  BP Readings from Last 3 Encounters:  08/04/15 167/92  05/24/15 130/78  05/19/15 155/95   Physical Exam  Constitutional: He is oriented to person, place, and time. He appears well-developed and well-nourished.  Cardiovascular: Normal rate.   Pulmonary/Chest: Effort normal.  Musculoskeletal:       Left hand: He exhibits tenderness, laceration and swelling (trace). He exhibits normal range of motion, no bony tenderness, normal capillary refill and no deformity. Normal sensation noted. Normal strength noted.       Hands: Neurological: He is  alert and oriented to person, place, and time.  Skin: Skin is warm and dry. No rash noted. No erythema. No pallor.   Dg Finger Thumb Left  08/04/2015  CLINICAL DATA:  LEFT thumb laceration today with a table saw, bleeding and pain, initial encounter EXAM: LEFT THUMB 2+V COMPARISON:  None FINDINGS: Extensive dressing artifacts at distal phalanx. Soft tissue deformity at tip of distal phalanx. Osseous mineralization normal. Joint spaces preserved. No definite acute fracture, dislocation, or bone destruction. IMPRESSION: No definite acute bony abnormalities. Electronically Signed   By: Lavonia Dana M.D.   On: 08/04/2015 14:23   PROCEDURE NOTE: laceration repair Verbal consent obtained from patient.  Local anesthesia with 5cc 0.5% Marcaine and 2% lidocaine without epinephrine.  Wound explored for tendon, ligament damage. Wound scrubbed with soap and water and rinsed. Wound closed with #8 4-0 Prolene sutures over a foil graft, #2 simple interrupted and 1 horizontal mattress suture underneath foil graft.  Wound cleansed and dressed.  Assessment and Plan :   1. Thumb laceration, left, initial encounter 2. Thumb pain, left - Stabilized with foil graft, patient to rtc in 3 days. Counseled on wound care. Start antibiotic course if infection develops.  3. Need for Tdap vaccination - TDAP today.  4. Elevated blood pressure reading without diagnosis of hypertension 5. Obesity - Has a history of elevated BP readings, plans on checking with his PCP for diagnosis of HTN. Counseled on risks of uncontrolled BP, obesity.   Jaynee Eagles, PA-C [facility] [phone] 08/04/2015 2:05 PM

## 2015-08-07 ENCOUNTER — Ambulatory Visit (INDEPENDENT_AMBULATORY_CARE_PROVIDER_SITE_OTHER): Payer: BLUE CROSS/BLUE SHIELD | Admitting: Urgent Care

## 2015-08-07 VITALS — BP 136/84 | HR 79 | Temp 98.0°F | Resp 18 | Wt 241.2 lb

## 2015-08-07 DIAGNOSIS — S61012D Laceration without foreign body of left thumb without damage to nail, subsequent encounter: Secondary | ICD-10-CM | POA: Diagnosis not present

## 2015-08-07 DIAGNOSIS — M79645 Pain in left finger(s): Secondary | ICD-10-CM

## 2015-08-07 MED ORDER — DOXYCYCLINE HYCLATE 100 MG PO CAPS: 100.0000 mg | ORAL_CAPSULE | Freq: Two times a day (BID) | ORAL | Status: DC

## 2015-08-07 NOTE — Progress Notes (Signed)
    MRN: [id number] DOB: 1956-05-26  Subjective:   Clayton [name] is a 60 y.o. male presenting for chief complaint of Follow-up  Patient was initially seen on 08/04/2015 for left thumb laceration due to table saw. Foil graft was applied. Patient has been unwilling to change dressing due to pain. He has not taken Ultram but has used ibuprofen twice daily for pain and inflammation. Denies fever, drainage of pus or bleeding, redness.  Clayton [name] has a current medication list which includes the following prescription(s): doxycycline and tramadol. Also has No Known Allergies.  Clayton [name]  has a past medical history of Obesity; Cyst; Vitamin D deficiency; Hypertrophic condition of skin; Atrophic condition of skin; Cellulitis/abscess - trunk; Lipoma; Sleep apnea; Asthma; Pneumonia; Stones in the urinary tract; and Arthritis. Also  has past surgical history that includes NASAL SINUS SURGERY---UVULA REMOVED (2004); cyst removed from tailbone (1980); Mass excision (07/15/11); Prostate biopsy (9/16); Tonsillectomy; Umbilical hernia repair (N/A, 05/23/2015); and Insertion of mesh (N/A, 05/23/2015).  Objective:   Vitals: BP 136/84 mmHg  Pulse 79  Temp(Src) 98 F (36.7 C) (Oral)  Resp 18  Wt 241 lb 3.2 oz (109.408 kg)  SpO2 97%  Physical Exam  Constitutional: He appears well-developed and well-nourished.  Cardiovascular: Normal rate.   Pulmonary/Chest: Effort normal.  Musculoskeletal:       Left hand: He exhibits tenderness (over wound up to DIP), laceration and swelling (trace). He exhibits normal range of motion, normal capillary refill and no deformity. Normal sensation noted. Normal strength noted.       Hands: Skin: Skin is warm and dry. No rash noted. No erythema. No pallor.   WOUND CARE: Dressing removed, wound cleansed with sterile water. A new non-adherent dressing was applied with coband.  Assessment and Plan :   1. Thumb laceration, left, subsequent encounter 2. Thumb pain, left - Wound  care provided, new dressing applied. Will start patient on doxycycline to cover for infection. Recheck in 1 week. Counseled patient on worsening signs and symptoms of infection, patient is to rtc before 1 week if these develop.  Jaynee Eagles, PA-C [facility] [phone] 08/07/2015 4:01 PM

## 2015-08-07 NOTE — Progress Notes (Signed)
  Medical screening examination/treatment/procedure(s) were performed by non-physician practitioner and as supervising physician I was immediately available for consultation/collaboration.     

## 2015-08-07 NOTE — Patient Instructions (Signed)

## 2015-08-14 ENCOUNTER — Ambulatory Visit (INDEPENDENT_AMBULATORY_CARE_PROVIDER_SITE_OTHER): Payer: BLUE CROSS/BLUE SHIELD | Admitting: Physician Assistant

## 2015-08-14 VITALS — BP 170/88 | HR 72 | Temp 98.1°F | Resp 18 | Ht 68.0 in | Wt 242.0 lb

## 2015-08-14 DIAGNOSIS — S61012D Laceration without foreign body of left thumb without damage to nail, subsequent encounter: Secondary | ICD-10-CM

## 2015-08-14 NOTE — Patient Instructions (Signed)
Wash the wound with soap and water at least 2 times each day. When you reapply the dressing, be sure the skin is completely dry. Leave the bandage OFF for several hours every day. The wound needs fresh air, and to dry. Complete the antibiotic until it's gone. Return for removal of the remaining sutures in 2-3 days.

## 2015-08-14 NOTE — Progress Notes (Signed)
Chief Complaint  Patient presents with  . Wound Check    History of Present Illness: Patient presents for wound check after wound with tissue deficit was closed with foil graft on 08/04/2015.   He is tolerating the doxycycline without difficulty. Reports almost no pain, only when he bumps the thumb. No fever, chills. No drainage from the wound. No redness or swelling.   No Known Allergies  Prior to Admission medications   Medication Sig Start Date End Date Taking? Authorizing Provider  doxycycline (VIBRAMYCIN) 100 MG capsule Take 1 capsule (100 mg total) by mouth 2 (two) times daily. 08/07/15  Yes Jaynee Eagles, PA-C  traMADol (ULTRAM) 50 MG tablet Take 1 tablet (50 mg total) by mouth every 8 (eight) hours as needed. Patient not taking: Reported on 08/07/2015 08/04/15   Jaynee Eagles, PA-C    Patient Active Problem List   Diagnosis Date Noted  . S/P hernia repair 05/23/2015  . Chronic cough 06/25/2011  . LIPOMA, left shoulder. 04/04/2008  . ALLERGIC RHINITIS 12/21/2007  . GERD 12/21/2007  . NEPHROLITHIASIS, HX OF 12/21/2007     Physical Exam  Constitutional: He is oriented to person, place, and time. He appears well-developed and well-nourished. He is active and cooperative. No distress.  BP 170/88 mmHg  Pulse 72  Temp(Src) 98.1 F (36.7 C) (Oral)  Resp 18  Ht 5\' 8"  (1.727 m)  Wt 242 lb (109.77 kg)  BMI 36.80 kg/m2  SpO2 98%   Eyes: Conjunctivae are normal.  Pulmonary/Chest: Effort normal.  Neurological: He is alert and oriented to person, place, and time.  Skin: Skin is warm and dry.  Bandage removed.  Foil graft in place with #5 SI prolene sutures. Large crusted blood noted along the distal edge. Minimally tender with suture removal.  Small amount of serous fluid noted at the suture sites. Foil graft removed revealing a granulated area of previous tissue defect. A small amount of yellowish material cleaned from the surface of the wound. Cleansed with soap and water, which  he tolerated well. #3 SI Prolene sutures remain under the large area of dried blood.  Psychiatric: He has a normal mood and affect. His speech is normal and behavior is normal.      ASSESSMENT & PLAN:  1. Thumb laceration, left, subsequent encounter Foil graft removed. #3 sutures remain. Continue antibiotic to completion. Wash wound at least twice daily. Leave open to the air for several hours each day. RTC in 2 days for planned removal of the remaining sutures.  2. HTN, uncontrolled He is managed by his PCP for this, seen last week. They are working on lifestyle changes.    Fara Chute, PA-C Physician Assistant-Certified [facility]

## 2015-08-16 ENCOUNTER — Ambulatory Visit (INDEPENDENT_AMBULATORY_CARE_PROVIDER_SITE_OTHER): Payer: BLUE CROSS/BLUE SHIELD | Admitting: Urgent Care

## 2015-08-16 VITALS — BP 138/88 | HR 77 | Temp 98.4°F | Resp 18 | Ht 68.0 in | Wt 242.0 lb

## 2015-08-16 DIAGNOSIS — M79645 Pain in left finger(s): Secondary | ICD-10-CM | POA: Diagnosis not present

## 2015-08-16 DIAGNOSIS — S61012D Laceration without foreign body of left thumb without damage to nail, subsequent encounter: Secondary | ICD-10-CM

## 2015-08-16 DIAGNOSIS — Z5189 Encounter for other specified aftercare: Secondary | ICD-10-CM | POA: Diagnosis not present

## 2015-08-16 NOTE — Patient Instructions (Addendum)
Wash the wound with soap and water at least 2 times each day. When you reapply the dressing, be sure the skin is completely dry. Leave the bandage OFF for several hours every day. The wound needs fresh air and to dry. Complete the antibiotic until it's gone.

## 2015-08-16 NOTE — Progress Notes (Signed)
    MRN: [id number] DOB: [birth year]  Subjective:   Clayton [name] is a 60 y.o. male presenting for chief complaint of Follow-up  Patient is presenting for f/u and wound care of his left thumb laceration. He reports improvement, has been washing and airing his thumb out as instructed. He still has 3 sutures in place but has had the foil graft removed. He is still taking doxycycline.  Clayton [name] has a current medication list which includes the following prescription(s): doxycycline and tramadol. Also has No Known Allergies.  Clayton [name]  has a past medical history of Obesity; Cyst; Vitamin D deficiency; Hypertrophic condition of skin; Atrophic condition of skin; Cellulitis/abscess - trunk; Lipoma; Sleep apnea; Asthma; Pneumonia; Stones in the urinary tract; and Arthritis. Also  has past surgical history that includes NASAL SINUS SURGERY---UVULA REMOVED (2004); cyst removed from tailbone (1980); Mass excision (07/15/11); Prostate biopsy (9/16); Tonsillectomy; Umbilical hernia repair (N/A, 05/23/2015); and Insertion of mesh (N/A, 05/23/2015).  Objective:   Vitals: BP 138/88 mmHg  Pulse 77  Temp(Src) 98.4 F (36.9 C) (Oral)  Resp 18  Ht 5\' 8"  (1.727 m)  Wt 242 lb (109.77 kg)  BMI 36.80 kg/m2  SpO2 97%  Physical Exam  Constitutional: He is oriented to person, place, and time. He appears well-developed and well-nourished.  Cardiovascular: Normal rate.   Pulmonary/Chest: Effort normal.  Musculoskeletal:       Left hand: He exhibits tenderness (mild over distal portion of wound), laceration and swelling (trace). He exhibits normal range of motion, normal capillary refill and no deformity. Normal strength noted.       Hands: Neurological: He is alert and oriented to person, place, and time.  Skin: Skin is warm and dry. No rash noted. No erythema. No pallor.   Wound Care - left thumb soaked in warm soap and water. A small amount of non-viable tissue was debrided. Cleansed and dressed.  Assessment  and Plan :   1. Thumb laceration, left, subsequent encounter 2. Thumb pain, left 3. Encounter for wound care - Improving, continue wound care at home and f/u once more to assure that patient achieves full resolution of laceration. Fast track card provided.  Jaynee Eagles, PA-C [facility] [phone] 08/16/2015 4:48 PM

## 2015-08-25 ENCOUNTER — Ambulatory Visit (INDEPENDENT_AMBULATORY_CARE_PROVIDER_SITE_OTHER): Payer: BLUE CROSS/BLUE SHIELD | Admitting: Physician Assistant

## 2015-08-25 VITALS — BP 152/90 | HR 71 | Temp 98.2°F | Resp 18 | Ht 68.0 in | Wt 236.0 lb

## 2015-08-25 DIAGNOSIS — S61012D Laceration without foreign body of left thumb without damage to nail, subsequent encounter: Secondary | ICD-10-CM | POA: Diagnosis not present

## 2015-08-25 DIAGNOSIS — R030 Elevated blood-pressure reading, without diagnosis of hypertension: Secondary | ICD-10-CM

## 2015-08-25 NOTE — Progress Notes (Signed)
[facility] [address] [place] [address] [phone]  Date:  08/25/2015   Name:  Clayton [name]   DOB:  [birth year]   MRN:  [id number]  PCP:  Michel Harrow, PA-C    Chief Complaint: Wound Check   History of Present Illness:  This is a 60 y.o. male who is presenting for wound check. He was here 08/04/15 with thumb laceration. Repaired by Rosario Adie with foil graft. He has been seen here for wound checks three times so far. This is his final wound check. Thumb still a little sore to the touch. Healing well. No purulence, erythema.  Pt is seeing his PCP for management of his BP.  Review of Systems:  Review of Systems See HPI  Patient Active Problem List   Diagnosis Date Noted  . S/P hernia repair 05/23/2015  . Chronic cough 06/25/2011  . LIPOMA, left shoulder. 04/04/2008  . ALLERGIC RHINITIS 12/21/2007  . GERD 12/21/2007  . NEPHROLITHIASIS, HX OF 12/21/2007    Prior to Admission medications   Medication Sig Start Date End Date Taking? Authorizing Provider  doxycycline (VIBRAMYCIN) 100 MG capsule Take 1 capsule (100 mg total) by mouth 2 (two) times daily. 08/07/15  Yes Jaynee Eagles, PA-C  traMADol (ULTRAM) 50 MG tablet Take 1 tablet (50 mg total) by mouth every 8 (eight) hours as needed. Patient not taking: Reported on 08/07/2015 08/04/15   Jaynee Eagles, PA-C    No Known Allergies  Past Surgical History  Procedure Laterality Date  . Nasal sinus surgery---uvula removed  2004    Dr. Wilburn Cornelia  . Cyst removed from [place]  . Mass excision  07/15/11    left shoulder mass   . Prostate biopsy  9/16  . Tonsillectomy    . Umbilical hernia repair N/A 05/23/2015    Procedure: LAPAROSCOPIC UMBILICAL HERNIA REPAIR ;  Surgeon: [name], MD;  Location: [facility];  Service: General;  Laterality: N/A;  . Insertion of mesh N/A 05/23/2015    Procedure: INSERTION OF MESH;  Surgeon: [name], MD;  Location: [facility];  Service: General;  Laterality:  N/A;    Social History  Substance Use Topics  . Smoking status: Never Smoker   . Smokeless tobacco: Never Used  . Alcohol Use: 6.0 oz/week    10 Cans of beer per week    Family History  Problem Relation Age of Onset  . Heart attack Father   . Heart disease Father     heart attack  . Allergies Mother   . Asthma Mother     Medication list has been reviewed and updated.  Physical Examination:  Physical Exam  Constitutional: He is oriented to person, place, and time. He appears well-developed and well-nourished. No distress.  HENT:  Head: Normocephalic and atraumatic.  Right Ear: Hearing normal.  Left Ear: Hearing normal.  Nose: Nose normal.  Eyes: Conjunctivae and lids are normal. Right eye exhibits no discharge. Left eye exhibits no discharge. No scleral icterus.  Pulmonary/Chest: Effort normal. No respiratory distress.  Musculoskeletal: Normal range of motion.  Neurological: He is alert and oriented to person, place, and time.  Skin: Skin is warm, dry and intact.  Left thumb with scabbing. Healing well. No erythema or drainage. Mild ttp over nail.  Psychiatric: He has a normal mood and affect. His speech is normal and behavior is normal. Thought content normal.   BP 152/90 mmHg  Pulse 71  Temp(Src) 98.2 F (36.8 C) (  Oral)  Resp 18  Ht 5\' 8"  (1.727 m)  Wt 236 lb (107.049 kg)  BMI 35.89 kg/m2  SpO2 98%  Assessment and Plan:  1. Thumb laceration, left, subsequent encounter Healing very well. No need for further follow up. Return as needed.  2. Elevated BP Continues to have elevated BP but a little better than prior readings. Follow up with PCP.   Benjaman Pott Drenda Freeze, MHS [facility]  08/25/2015

## 2016-05-21 DIAGNOSIS — L729 Follicular cyst of the skin and subcutaneous tissue, unspecified: Secondary | ICD-10-CM | POA: Diagnosis not present

## 2016-05-22 ENCOUNTER — Other Ambulatory Visit: Payer: Self-pay | Admitting: General Surgery

## 2016-05-22 DIAGNOSIS — R2231 Localized swelling, mass and lump, right upper limb: Secondary | ICD-10-CM

## 2016-05-31 ENCOUNTER — Ambulatory Visit
Admission: RE | Admit: 2016-05-31 | Discharge: 2016-05-31 | Disposition: A | Discharge status: Home / Self Care | Payer: BLUE CROSS/BLUE SHIELD | Source: Ambulatory Visit | Attending: General Surgery | Admitting: General Surgery

## 2016-05-31 DIAGNOSIS — R2231 Localized swelling, mass and lump, right upper limb: Secondary | ICD-10-CM

## 2016-05-31 DIAGNOSIS — N6310 Unspecified lump in the right breast, unspecified quadrant: Secondary | ICD-10-CM | POA: Diagnosis not present

## 2016-06-13 DIAGNOSIS — M19011 Primary osteoarthritis, right shoulder: Secondary | ICD-10-CM | POA: Diagnosis not present

## 2016-06-13 DIAGNOSIS — M25811 Other specified joint disorders, right shoulder: Secondary | ICD-10-CM | POA: Diagnosis not present

## 2016-08-22 DIAGNOSIS — R9721 Rising PSA following treatment for malignant neoplasm of prostate: Secondary | ICD-10-CM | POA: Diagnosis not present

## 2016-09-03 DIAGNOSIS — R972 Elevated prostate specific antigen [PSA]: Secondary | ICD-10-CM | POA: Diagnosis not present

## 2016-09-03 DIAGNOSIS — R3915 Urgency of urination: Secondary | ICD-10-CM | POA: Diagnosis not present

## 2016-09-03 DIAGNOSIS — N5201 Erectile dysfunction due to arterial insufficiency: Secondary | ICD-10-CM | POA: Diagnosis not present

## 2017-02-06 DIAGNOSIS — S30861A Insect bite (nonvenomous) of abdominal wall, initial encounter: Secondary | ICD-10-CM | POA: Diagnosis not present

## 2017-02-06 DIAGNOSIS — R21 Rash and other nonspecific skin eruption: Secondary | ICD-10-CM | POA: Diagnosis not present

## 2017-02-06 DIAGNOSIS — J069 Acute upper respiratory infection, unspecified: Secondary | ICD-10-CM | POA: Diagnosis not present

## 2017-02-06 DIAGNOSIS — R509 Fever, unspecified: Secondary | ICD-10-CM | POA: Diagnosis not present

## 2017-04-25 ENCOUNTER — Other Ambulatory Visit: Payer: Self-pay | Admitting: Podiatry

## 2017-04-25 ENCOUNTER — Ambulatory Visit (INDEPENDENT_AMBULATORY_CARE_PROVIDER_SITE_OTHER): Payer: BLUE CROSS/BLUE SHIELD | Admitting: Podiatry

## 2017-04-25 ENCOUNTER — Ambulatory Visit (INDEPENDENT_AMBULATORY_CARE_PROVIDER_SITE_OTHER): Payer: BLUE CROSS/BLUE SHIELD

## 2017-04-25 ENCOUNTER — Encounter: Payer: Self-pay | Admitting: Podiatry

## 2017-04-25 VITALS — BP 167/93 | HR 72

## 2017-04-25 DIAGNOSIS — M129 Arthropathy, unspecified: Secondary | ICD-10-CM

## 2017-04-25 DIAGNOSIS — M7671 Peroneal tendinitis, right leg: Secondary | ICD-10-CM | POA: Diagnosis not present

## 2017-04-25 DIAGNOSIS — M7672 Peroneal tendinitis, left leg: Secondary | ICD-10-CM

## 2017-04-25 DIAGNOSIS — R52 Pain, unspecified: Secondary | ICD-10-CM

## 2017-04-25 MED ORDER — MELOXICAM 15 MG PO TABS: 15.0000 mg | ORAL_TABLET | Freq: Every day | ORAL | 0 refills | Status: DC

## 2017-04-25 NOTE — Progress Notes (Addendum)
   Subjective:    Patient ID: Clayton [name], male    DOB: 22-Jun-1956, 61 y.o.   MRN: [id number]  HPI this patient presents the office stating he's having pain in the bottom of both his feet for years.  He says that his feet improve and then worsen, depending on the time of year.  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  He had a poor experience with Dr. Gershon Mussel who diagnosed a fracture on his left foot.  He presents the office stating his pain on the top of his left foot which is aggravated by activity.  He says Dr. Gershon Mussel prescribed him a hard set of orthotics for him to wear but he was unable to wear these. Due to the significant bony prominence on the top of his left foot.  He also says he has pain on the outside midfoot of his right foot.  Examination the foot does reveal increased swelling and a bony prominences noted at the base of the fifth metatarsal.  Palpable pain is noted at the insertion of the tendon right foot.  He presents the office today for an evaluation and treatment of both of his feet    Review of Systems  All other systems reviewed and are negative.      Objective:   Physical Exam GENERAL APPEARANCE: Alert, conversant. Appropriately groomed. No acute distress.  VASCULAR: Pedal pulses are  palpable at  [facility] and PT bilateral.  Capillary refill time is immediate to all digits,  Normal temperature gradient.  Digital hair growth is present bilateral  NEUROLOGIC: sensation is normal to 5.07 monofilament at 5/5 sites bilateral.  Light touch is intact bilateral, Muscle strength normal.  MUSCULOSKELETAL: acceptable muscle strength, tone and stability bilateral.  Intrinsic muscluature intact bilateral.  Rectus appearance of foot and digits noted bilateral.   Bony e xostosis noted on the dorsum of left forefoot.  No pain noted at the insertion of the plantar fascia left foot.  There is swelling and enlargement at the base of the fifth metatarsal of the right foot.  No  pain noted along the course of the peroneal tendon of the right foot.  DERMATOLOGIC: skin color, texture, and turgor are within normal limits.  No preulcerative lesions or ulcers  are seen, no interdigital maceration noted.  No open lesions present.  Digital nails are asymptomatic. No drainage noted.         Assessment & Plan:  Foot arthritis left foot  Insertional tendinitis peroneal tendon right foot.   Distal examination examination of the left. X-ray does reveal dorsal degenerative changes at the midfoot.  Examination of the left foot reveals no bony pathology at the base of the fifth metatarsal right foot.  Calcification noted at the insertion of the plantar fascia bilaterally.  Discussed treatment with this patient.  We are to provide him with power step insoles to be worn in his shoe with a change in his shoelace technique left foot.  Prescribed the Mobic  for the arthritis and the insertional tendinitis pain, right foot. Patient is to wear the power step insoles and takes the Mobic for 3 weeks and to return to the office at which time we will evaluate his progress.  He was also told to bring his orthotics that he got from Dr. Gershon Mussel at that follow-up visit for examination of his orthotics.     RTC 3 weeks.

## 2017-05-23 ENCOUNTER — Encounter: Payer: Self-pay | Admitting: Podiatry

## 2017-05-23 ENCOUNTER — Ambulatory Visit (INDEPENDENT_AMBULATORY_CARE_PROVIDER_SITE_OTHER): Payer: BLUE CROSS/BLUE SHIELD | Admitting: Podiatry

## 2017-05-23 ENCOUNTER — Other Ambulatory Visit: Payer: Self-pay | Admitting: Podiatry

## 2017-05-23 DIAGNOSIS — M7671 Peroneal tendinitis, right leg: Secondary | ICD-10-CM

## 2017-05-23 DIAGNOSIS — M129 Arthropathy, unspecified: Secondary | ICD-10-CM

## 2017-05-23 MED ORDER — MELOXICAM 15 MG PO TABS: 15.0000 mg | ORAL_TABLET | Freq: Every day | ORAL | 0 refills | Status: DC

## 2017-05-23 NOTE — Progress Notes (Signed)
This patient presents the office for evaluation of his painful feet.  He was diagnosed as having arthritis of the left foot and a peroneal tendon insertional tendinitis of the right foot.  Patient was given Mobic and power step insoles to be worn in his shoes.  He also has developed lacing techniques on his feet due to the arthritis.  He presents the office today stating that his left foot has completely improved and he is not experiencing any pain or discomfort.  He says he still experiencing pain on the outside top of his right foot.  He says this is painful, especially after he walks all day at work.  He presents the office today for continued evaluation and treatment of this painful right foot   General Appearance  Alert, conversant and in no acute stress.  Vascular  Dorsalis pedis and posterior pulses are palpable  bilaterally.  Capillary return is within normal limits  Bilaterally. Temperature is within normal limits  Bilaterally  Neurologic  Senn-Weinstein monofilament wire test within normal limits  bilaterally. Muscle power  Within normal limits bilaterally.  Nails normal nails with no evidence of bacterial or fungal infection.  Orthopedic  No limitations of motion of motion feet bilaterally.  No crepitus or effusions noted.  No bony pathology or digital deformities noted. Bony exostosis noted on the dorsum of the left forefoot.  Pain has resolved. At the insertion of the plantar fascia left foot.  Continued swelling and enlargement at the base of the fifth metatarsal of the right foot.  Skin  normotropic skin with no porokeratosis noted bilaterally.  No signs of infections or ulcers noted.    Peroneal tendinitis right foot.  ROV  injection therapy right foot.  Patient was told to continue to wear the power step insoles as well as continue with the Mobic.  Patient will have Mobic refilled.  RTC prn.   Gardiner Barefoot DPM.

## 2017-07-04 DIAGNOSIS — B9789 Other viral agents as the cause of diseases classified elsewhere: Secondary | ICD-10-CM | POA: Diagnosis not present

## 2017-07-04 DIAGNOSIS — J069 Acute upper respiratory infection, unspecified: Secondary | ICD-10-CM | POA: Diagnosis not present

## 2017-07-04 DIAGNOSIS — R030 Elevated blood-pressure reading, without diagnosis of hypertension: Secondary | ICD-10-CM | POA: Diagnosis not present

## 2017-07-07 DIAGNOSIS — J069 Acute upper respiratory infection, unspecified: Secondary | ICD-10-CM | POA: Diagnosis not present

## 2017-07-07 DIAGNOSIS — J018 Other acute sinusitis: Secondary | ICD-10-CM | POA: Diagnosis not present

## 2017-07-16 DIAGNOSIS — E119 Type 2 diabetes mellitus without complications: Secondary | ICD-10-CM | POA: Diagnosis not present

## 2017-07-16 DIAGNOSIS — I10 Essential (primary) hypertension: Secondary | ICD-10-CM | POA: Diagnosis not present

## 2017-07-16 DIAGNOSIS — G4733 Obstructive sleep apnea (adult) (pediatric): Secondary | ICD-10-CM | POA: Diagnosis not present

## 2017-07-16 DIAGNOSIS — Z23 Encounter for immunization: Secondary | ICD-10-CM | POA: Diagnosis not present

## 2017-07-16 DIAGNOSIS — E782 Mixed hyperlipidemia: Secondary | ICD-10-CM | POA: Diagnosis not present

## 2017-08-14 ENCOUNTER — Other Ambulatory Visit: Payer: Self-pay | Admitting: Podiatry

## 2017-09-09 DIAGNOSIS — E782 Mixed hyperlipidemia: Secondary | ICD-10-CM | POA: Diagnosis not present

## 2017-09-09 DIAGNOSIS — R809 Proteinuria, unspecified: Secondary | ICD-10-CM | POA: Diagnosis not present

## 2017-09-09 DIAGNOSIS — F411 Generalized anxiety disorder: Secondary | ICD-10-CM | POA: Diagnosis not present

## 2017-09-09 DIAGNOSIS — E1129 Type 2 diabetes mellitus with other diabetic kidney complication: Secondary | ICD-10-CM | POA: Diagnosis not present

## 2017-09-22 DIAGNOSIS — G4733 Obstructive sleep apnea (adult) (pediatric): Secondary | ICD-10-CM | POA: Diagnosis not present

## 2017-11-20 DIAGNOSIS — E1129 Type 2 diabetes mellitus with other diabetic kidney complication: Secondary | ICD-10-CM | POA: Diagnosis not present

## 2017-11-20 DIAGNOSIS — R809 Proteinuria, unspecified: Secondary | ICD-10-CM | POA: Diagnosis not present

## 2017-11-20 DIAGNOSIS — I10 Essential (primary) hypertension: Secondary | ICD-10-CM | POA: Diagnosis not present

## 2018-01-19 DIAGNOSIS — N200 Calculus of kidney: Secondary | ICD-10-CM | POA: Diagnosis not present

## 2018-01-19 DIAGNOSIS — R1031 Right lower quadrant pain: Secondary | ICD-10-CM | POA: Diagnosis not present

## 2018-01-19 DIAGNOSIS — R1084 Generalized abdominal pain: Secondary | ICD-10-CM | POA: Diagnosis not present

## 2018-02-03 DIAGNOSIS — R350 Frequency of micturition: Secondary | ICD-10-CM | POA: Diagnosis not present

## 2018-02-03 DIAGNOSIS — R109 Unspecified abdominal pain: Secondary | ICD-10-CM | POA: Diagnosis not present

## 2018-02-03 DIAGNOSIS — R972 Elevated prostate specific antigen [PSA]: Secondary | ICD-10-CM | POA: Diagnosis not present

## 2018-02-11 DIAGNOSIS — K5730 Diverticulosis of large intestine without perforation or abscess without bleeding: Secondary | ICD-10-CM | POA: Diagnosis not present

## 2018-02-11 DIAGNOSIS — N200 Calculus of kidney: Secondary | ICD-10-CM | POA: Diagnosis not present

## 2018-02-25 DIAGNOSIS — E119 Type 2 diabetes mellitus without complications: Secondary | ICD-10-CM | POA: Diagnosis not present

## 2018-02-25 DIAGNOSIS — M545 Low back pain: Secondary | ICD-10-CM | POA: Diagnosis not present

## 2018-02-25 DIAGNOSIS — M4310 Spondylolisthesis, site unspecified: Secondary | ICD-10-CM | POA: Diagnosis not present

## 2018-02-25 DIAGNOSIS — M5136 Other intervertebral disc degeneration, lumbar region: Secondary | ICD-10-CM | POA: Diagnosis not present

## 2018-05-15 DIAGNOSIS — E1129 Type 2 diabetes mellitus with other diabetic kidney complication: Secondary | ICD-10-CM | POA: Diagnosis not present

## 2018-05-15 DIAGNOSIS — R809 Proteinuria, unspecified: Secondary | ICD-10-CM | POA: Diagnosis not present

## 2018-05-15 DIAGNOSIS — E782 Mixed hyperlipidemia: Secondary | ICD-10-CM | POA: Diagnosis not present

## 2018-05-15 DIAGNOSIS — I10 Essential (primary) hypertension: Secondary | ICD-10-CM | POA: Diagnosis not present

## 2018-06-01 DIAGNOSIS — Z026 Encounter for examination for insurance purposes: Secondary | ICD-10-CM | POA: Diagnosis not present

## 2018-06-01 DIAGNOSIS — M1712 Unilateral primary osteoarthritis, left knee: Secondary | ICD-10-CM | POA: Diagnosis not present

## 2018-06-01 DIAGNOSIS — M25562 Pain in left knee: Secondary | ICD-10-CM | POA: Diagnosis not present

## 2018-06-24 DIAGNOSIS — M545 Low back pain: Secondary | ICD-10-CM | POA: Diagnosis not present

## 2018-07-18 DIAGNOSIS — M4310 Spondylolisthesis, site unspecified: Secondary | ICD-10-CM | POA: Diagnosis not present

## 2018-07-18 DIAGNOSIS — M4316 Spondylolisthesis, lumbar region: Secondary | ICD-10-CM | POA: Diagnosis not present

## 2018-07-18 DIAGNOSIS — M5416 Radiculopathy, lumbar region: Secondary | ICD-10-CM | POA: Diagnosis not present

## 2018-10-01 DIAGNOSIS — G4733 Obstructive sleep apnea (adult) (pediatric): Secondary | ICD-10-CM | POA: Diagnosis not present

## 2018-10-01 DIAGNOSIS — E1129 Type 2 diabetes mellitus with other diabetic kidney complication: Secondary | ICD-10-CM | POA: Diagnosis not present

## 2018-10-01 DIAGNOSIS — I10 Essential (primary) hypertension: Secondary | ICD-10-CM | POA: Diagnosis not present

## 2018-10-01 DIAGNOSIS — E782 Mixed hyperlipidemia: Secondary | ICD-10-CM | POA: Diagnosis not present

## 2018-12-02 DIAGNOSIS — Z7189 Other specified counseling: Secondary | ICD-10-CM | POA: Diagnosis not present

## 2018-12-02 DIAGNOSIS — J309 Allergic rhinitis, unspecified: Secondary | ICD-10-CM | POA: Diagnosis not present

## 2018-12-02 DIAGNOSIS — Z20828 Contact with and (suspected) exposure to other viral communicable diseases: Secondary | ICD-10-CM | POA: Diagnosis not present

## 2018-12-03 DIAGNOSIS — Z20828 Contact with and (suspected) exposure to other viral communicable diseases: Secondary | ICD-10-CM | POA: Diagnosis not present

## 2018-12-31 DIAGNOSIS — F3341 Major depressive disorder, recurrent, in partial remission: Secondary | ICD-10-CM | POA: Diagnosis not present

## 2018-12-31 DIAGNOSIS — E1129 Type 2 diabetes mellitus with other diabetic kidney complication: Secondary | ICD-10-CM | POA: Diagnosis not present

## 2018-12-31 DIAGNOSIS — I10 Essential (primary) hypertension: Secondary | ICD-10-CM | POA: Diagnosis not present

## 2019-01-08 DIAGNOSIS — R3915 Urgency of urination: Secondary | ICD-10-CM | POA: Diagnosis not present

## 2019-01-08 DIAGNOSIS — R972 Elevated prostate specific antigen [PSA]: Secondary | ICD-10-CM | POA: Diagnosis not present

## 2019-06-25 DIAGNOSIS — E1129 Type 2 diabetes mellitus with other diabetic kidney complication: Secondary | ICD-10-CM | POA: Diagnosis not present

## 2019-06-25 DIAGNOSIS — E782 Mixed hyperlipidemia: Secondary | ICD-10-CM | POA: Diagnosis not present

## 2019-06-25 DIAGNOSIS — R809 Proteinuria, unspecified: Secondary | ICD-10-CM | POA: Diagnosis not present

## 2019-06-25 DIAGNOSIS — Z0000 Encounter for general adult medical examination without abnormal findings: Secondary | ICD-10-CM | POA: Diagnosis not present

## 2019-06-25 DIAGNOSIS — I10 Essential (primary) hypertension: Secondary | ICD-10-CM | POA: Diagnosis not present

## 2019-10-05 DIAGNOSIS — M4316 Spondylolisthesis, lumbar region: Secondary | ICD-10-CM | POA: Diagnosis not present

## 2019-12-24 DIAGNOSIS — F411 Generalized anxiety disorder: Secondary | ICD-10-CM | POA: Diagnosis not present

## 2019-12-24 DIAGNOSIS — F3341 Major depressive disorder, recurrent, in partial remission: Secondary | ICD-10-CM | POA: Diagnosis not present

## 2019-12-24 DIAGNOSIS — E1129 Type 2 diabetes mellitus with other diabetic kidney complication: Secondary | ICD-10-CM | POA: Diagnosis not present

## 2019-12-24 DIAGNOSIS — I10 Essential (primary) hypertension: Secondary | ICD-10-CM | POA: Diagnosis not present

## 2019-12-24 DIAGNOSIS — R809 Proteinuria, unspecified: Secondary | ICD-10-CM | POA: Diagnosis not present

## 2020-03-09 DIAGNOSIS — M545 Low back pain: Secondary | ICD-10-CM | POA: Diagnosis not present

## 2020-03-09 DIAGNOSIS — M5136 Other intervertebral disc degeneration, lumbar region: Secondary | ICD-10-CM | POA: Diagnosis not present

## 2020-03-09 DIAGNOSIS — M4316 Spondylolisthesis, lumbar region: Secondary | ICD-10-CM | POA: Diagnosis not present

## 2020-04-01 DIAGNOSIS — M5136 Other intervertebral disc degeneration, lumbar region: Secondary | ICD-10-CM | POA: Diagnosis not present

## 2020-04-07 DIAGNOSIS — M549 Dorsalgia, unspecified: Secondary | ICD-10-CM | POA: Diagnosis not present

## 2020-04-07 DIAGNOSIS — M4317 Spondylolisthesis, lumbosacral region: Secondary | ICD-10-CM | POA: Diagnosis not present

## 2020-04-18 DIAGNOSIS — M5136 Other intervertebral disc degeneration, lumbar region: Secondary | ICD-10-CM | POA: Diagnosis not present

## 2020-04-24 ENCOUNTER — Other Ambulatory Visit: Payer: Self-pay

## 2020-04-24 ENCOUNTER — Emergency Department (HOSPITAL_COMMUNITY): Payer: BC Managed Care – PPO

## 2020-04-24 ENCOUNTER — Inpatient Hospital Stay (HOSPITAL_COMMUNITY)
Admission: EM | Admit: 2020-04-24 | Discharge: 2020-05-30 | DRG: 871 | Disposition: A | Discharge status: Other Acute Inpatient Hospital | Payer: BC Managed Care – PPO | Attending: Internal Medicine | Admitting: Internal Medicine

## 2020-04-24 ENCOUNTER — Encounter (HOSPITAL_COMMUNITY): Payer: Self-pay | Admitting: Emergency Medicine

## 2020-04-24 DIAGNOSIS — L89891 Pressure ulcer of other site, stage 1: Secondary | ICD-10-CM | POA: Diagnosis not present

## 2020-04-24 DIAGNOSIS — T502X5A Adverse effect of carbonic-anhydrase inhibitors, benzothiadiazides and other diuretics, initial encounter: Secondary | ICD-10-CM | POA: Diagnosis not present

## 2020-04-24 DIAGNOSIS — I5033 Acute on chronic diastolic (congestive) heart failure: Secondary | ICD-10-CM | POA: Diagnosis not present

## 2020-04-24 DIAGNOSIS — R0600 Dyspnea, unspecified: Secondary | ICD-10-CM

## 2020-04-24 DIAGNOSIS — J159 Unspecified bacterial pneumonia: Secondary | ICD-10-CM | POA: Diagnosis not present

## 2020-04-24 DIAGNOSIS — K567 Ileus, unspecified: Secondary | ICD-10-CM | POA: Diagnosis not present

## 2020-04-24 DIAGNOSIS — Z825 Family history of asthma and other chronic lower respiratory diseases: Secondary | ICD-10-CM

## 2020-04-24 DIAGNOSIS — Z6836 Body mass index (BMI) 36.0-36.9, adult: Secondary | ICD-10-CM

## 2020-04-24 DIAGNOSIS — A4189 Other specified sepsis: Secondary | ICD-10-CM | POA: Diagnosis not present

## 2020-04-24 DIAGNOSIS — J188 Other pneumonia, unspecified organism: Secondary | ICD-10-CM | POA: Insufficient documentation

## 2020-04-24 DIAGNOSIS — I5030 Unspecified diastolic (congestive) heart failure: Secondary | ICD-10-CM | POA: Diagnosis not present

## 2020-04-24 DIAGNOSIS — J9601 Acute respiratory failure with hypoxia: Secondary | ICD-10-CM | POA: Diagnosis not present

## 2020-04-24 DIAGNOSIS — R739 Hyperglycemia, unspecified: Secondary | ICD-10-CM | POA: Diagnosis not present

## 2020-04-24 DIAGNOSIS — J189 Pneumonia, unspecified organism: Secondary | ICD-10-CM | POA: Diagnosis not present

## 2020-04-24 DIAGNOSIS — J1282 Pneumonia due to coronavirus disease 2019: Secondary | ICD-10-CM | POA: Diagnosis present

## 2020-04-24 DIAGNOSIS — E1165 Type 2 diabetes mellitus with hyperglycemia: Secondary | ICD-10-CM | POA: Diagnosis not present

## 2020-04-24 DIAGNOSIS — F419 Anxiety disorder, unspecified: Secondary | ICD-10-CM | POA: Diagnosis present

## 2020-04-24 DIAGNOSIS — J1289 Other viral pneumonia: Secondary | ICD-10-CM | POA: Diagnosis not present

## 2020-04-24 DIAGNOSIS — E46 Unspecified protein-calorie malnutrition: Secondary | ICD-10-CM | POA: Diagnosis not present

## 2020-04-24 DIAGNOSIS — R059 Cough, unspecified: Secondary | ICD-10-CM | POA: Diagnosis not present

## 2020-04-24 DIAGNOSIS — E873 Alkalosis: Secondary | ICD-10-CM | POA: Diagnosis not present

## 2020-04-24 DIAGNOSIS — U071 COVID-19: Secondary | ICD-10-CM | POA: Diagnosis not present

## 2020-04-24 DIAGNOSIS — F329 Major depressive disorder, single episode, unspecified: Secondary | ICD-10-CM | POA: Diagnosis present

## 2020-04-24 DIAGNOSIS — R7989 Other specified abnormal findings of blood chemistry: Secondary | ICD-10-CM | POA: Diagnosis not present

## 2020-04-24 DIAGNOSIS — I82462 Acute embolism and thrombosis of left calf muscular vein: Secondary | ICD-10-CM | POA: Diagnosis present

## 2020-04-24 DIAGNOSIS — R918 Other nonspecific abnormal finding of lung field: Secondary | ICD-10-CM | POA: Diagnosis not present

## 2020-04-24 DIAGNOSIS — R0902 Hypoxemia: Secondary | ICD-10-CM | POA: Diagnosis not present

## 2020-04-24 DIAGNOSIS — M1990 Unspecified osteoarthritis, unspecified site: Secondary | ICD-10-CM | POA: Diagnosis present

## 2020-04-24 DIAGNOSIS — A0839 Other viral enteritis: Secondary | ICD-10-CM | POA: Diagnosis not present

## 2020-04-24 DIAGNOSIS — R531 Weakness: Secondary | ICD-10-CM | POA: Diagnosis not present

## 2020-04-24 DIAGNOSIS — G894 Chronic pain syndrome: Secondary | ICD-10-CM | POA: Diagnosis present

## 2020-04-24 DIAGNOSIS — I82443 Acute embolism and thrombosis of tibial vein, bilateral: Secondary | ICD-10-CM | POA: Diagnosis not present

## 2020-04-24 DIAGNOSIS — E440 Moderate protein-calorie malnutrition: Secondary | ICD-10-CM | POA: Diagnosis not present

## 2020-04-24 DIAGNOSIS — E871 Hypo-osmolality and hyponatremia: Secondary | ICD-10-CM | POA: Diagnosis present

## 2020-04-24 DIAGNOSIS — Z20822 Contact with and (suspected) exposure to covid-19: Secondary | ICD-10-CM

## 2020-04-24 DIAGNOSIS — E559 Vitamin D deficiency, unspecified: Secondary | ICD-10-CM | POA: Diagnosis not present

## 2020-04-24 DIAGNOSIS — J9690 Respiratory failure, unspecified, unspecified whether with hypoxia or hypercapnia: Secondary | ICD-10-CM | POA: Diagnosis not present

## 2020-04-24 DIAGNOSIS — A419 Sepsis, unspecified organism: Secondary | ICD-10-CM | POA: Diagnosis not present

## 2020-04-24 DIAGNOSIS — F418 Other specified anxiety disorders: Secondary | ICD-10-CM | POA: Diagnosis not present

## 2020-04-24 DIAGNOSIS — J80 Acute respiratory distress syndrome: Secondary | ICD-10-CM | POA: Diagnosis present

## 2020-04-24 DIAGNOSIS — E785 Hyperlipidemia, unspecified: Secondary | ICD-10-CM | POA: Diagnosis present

## 2020-04-24 DIAGNOSIS — E876 Hypokalemia: Secondary | ICD-10-CM | POA: Diagnosis not present

## 2020-04-24 DIAGNOSIS — I82403 Acute embolism and thrombosis of unspecified deep veins of lower extremity, bilateral: Secondary | ICD-10-CM | POA: Diagnosis not present

## 2020-04-24 DIAGNOSIS — R0602 Shortness of breath: Secondary | ICD-10-CM | POA: Diagnosis not present

## 2020-04-24 DIAGNOSIS — J90 Pleural effusion, not elsewhere classified: Secondary | ICD-10-CM | POA: Diagnosis not present

## 2020-04-24 DIAGNOSIS — Z791 Long term (current) use of non-steroidal anti-inflammatories (NSAID): Secondary | ICD-10-CM

## 2020-04-24 DIAGNOSIS — I110 Hypertensive heart disease with heart failure: Secondary | ICD-10-CM | POA: Diagnosis present

## 2020-04-24 DIAGNOSIS — R1310 Dysphagia, unspecified: Secondary | ICD-10-CM | POA: Diagnosis not present

## 2020-04-24 DIAGNOSIS — G8929 Other chronic pain: Secondary | ICD-10-CM | POA: Diagnosis not present

## 2020-04-24 DIAGNOSIS — I82451 Acute embolism and thrombosis of right peroneal vein: Secondary | ICD-10-CM | POA: Diagnosis present

## 2020-04-24 DIAGNOSIS — E11649 Type 2 diabetes mellitus with hypoglycemia without coma: Secondary | ICD-10-CM | POA: Diagnosis not present

## 2020-04-24 DIAGNOSIS — L89621 Pressure ulcer of left heel, stage 1: Secondary | ICD-10-CM | POA: Diagnosis not present

## 2020-04-24 DIAGNOSIS — R21 Rash and other nonspecific skin eruption: Secondary | ICD-10-CM | POA: Diagnosis not present

## 2020-04-24 DIAGNOSIS — G4733 Obstructive sleep apnea (adult) (pediatric): Secondary | ICD-10-CM | POA: Diagnosis present

## 2020-04-24 DIAGNOSIS — K121 Other forms of stomatitis: Secondary | ICD-10-CM | POA: Diagnosis not present

## 2020-04-24 DIAGNOSIS — Z6830 Body mass index (BMI) 30.0-30.9, adult: Secondary | ICD-10-CM

## 2020-04-24 DIAGNOSIS — Z8249 Family history of ischemic heart disease and other diseases of the circulatory system: Secondary | ICD-10-CM

## 2020-04-24 DIAGNOSIS — J984 Other disorders of lung: Secondary | ICD-10-CM | POA: Diagnosis not present

## 2020-04-24 DIAGNOSIS — E875 Hyperkalemia: Secondary | ICD-10-CM | POA: Diagnosis not present

## 2020-04-24 DIAGNOSIS — Z209 Contact with and (suspected) exposure to unspecified communicable disease: Secondary | ICD-10-CM | POA: Diagnosis not present

## 2020-04-24 DIAGNOSIS — E669 Obesity, unspecified: Secondary | ICD-10-CM | POA: Diagnosis not present

## 2020-04-24 DIAGNOSIS — J45909 Unspecified asthma, uncomplicated: Secondary | ICD-10-CM | POA: Diagnosis not present

## 2020-04-24 LAB — COMPREHENSIVE METABOLIC PANEL
ALT: 37 U/L (ref 0–44)
AST: 59 U/L — ABNORMAL HIGH (ref 15–41)
Albumin: 3.2 g/dL — ABNORMAL LOW (ref 3.5–5.0)
Alkaline Phosphatase: 43 U/L (ref 38–126)
Anion gap: 10 (ref 5–15)
BUN: 22 mg/dL (ref 8–23)
CO2: 25 mmol/L (ref 22–32)
Calcium: 7.7 mg/dL — ABNORMAL LOW (ref 8.9–10.3)
Chloride: 95 mmol/L — ABNORMAL LOW (ref 98–111)
Creatinine, Ser: 0.99 mg/dL (ref 0.61–1.24)
GFR calc Af Amer: >60 mL/min (ref >60)
GFR calc non Af Amer: >60 mL/min (ref >60)
Glucose, Bld: 245 mg/dL — ABNORMAL HIGH (ref 70–99)
Potassium: 3.3 mmol/L — ABNORMAL LOW (ref 3.5–5.1)
Sodium: 130 mmol/L — ABNORMAL LOW (ref 135–145)
Total Bilirubin: 0.7 mg/dL (ref 0.3–1.2)
Total Protein: 7.1 g/dL (ref 6.5–8.1)

## 2020-04-24 LAB — SARS CORONAVIRUS 2 BY RT PCR (HOSPITAL ORDER, PERFORMED IN ~~LOC~~ HOSPITAL LAB): SARS Coronavirus 2: POSITIVE — AB

## 2020-04-24 LAB — CBC WITH DIFFERENTIAL/PLATELET
Abs Immature Granulocytes: 0.06 10*3/uL (ref 0.00–0.07)
Basophils Absolute: 0 10*3/uL (ref 0.0–0.1)
Basophils Relative: 0 %
Eosinophils Absolute: 0 10*3/uL (ref 0.0–0.5)
Eosinophils Relative: 0 %
HCT: 39 % (ref 39.0–52.0)
Hemoglobin: 12.9 g/dL — ABNORMAL LOW (ref 13.0–17.0)
Immature Granulocytes: 1 %
Lymphocytes Relative: 6 %
Lymphs Abs: 0.5 10*3/uL — ABNORMAL LOW (ref 0.7–4.0)
MCH: 30 pg (ref 26.0–34.0)
MCHC: 33.1 g/dL (ref 30.0–36.0)
MCV: 90.7 fL (ref 80.0–100.0)
Monocytes Absolute: 1.1 10*3/uL — ABNORMAL HIGH (ref 0.1–1.0)
Monocytes Relative: 13 %
Neutro Abs: 6.6 10*3/uL (ref 1.7–7.7)
Neutrophils Relative %: 80 %
Platelets: 279 10*3/uL (ref 150–400)
RBC: 4.3 MIL/uL (ref 4.22–5.81)
RDW: 12.9 % (ref 11.5–15.5)
WBC: 8.2 10*3/uL (ref 4.0–10.5)
nRBC: 0 % (ref 0.0–0.2)

## 2020-04-24 LAB — MRSA PCR SCREENING: MRSA by PCR: NEGATIVE

## 2020-04-24 LAB — FERRITIN: Ferritin: 1475 ng/mL — ABNORMAL HIGH (ref 24–336)

## 2020-04-24 LAB — C-REACTIVE PROTEIN: CRP: 19 mg/dL — ABNORMAL HIGH (ref <1.0)

## 2020-04-24 LAB — D-DIMER, QUANTITATIVE: D-Dimer, Quant: 0.6 ug/mL-FEU — ABNORMAL HIGH (ref 0.00–0.50)

## 2020-04-24 LAB — TRIGLYCERIDES: Triglycerides: 163 mg/dL — ABNORMAL HIGH (ref <150)

## 2020-04-24 LAB — HIV ANTIBODY (ROUTINE TESTING W REFLEX): HIV Screen 4th Generation wRfx: NONREACTIVE

## 2020-04-24 LAB — CBG MONITORING, ED
Glucose-Capillary: 253 mg/dL — ABNORMAL HIGH (ref 70–99)
Glucose-Capillary: 244 mg/dL — ABNORMAL HIGH (ref 70–99)

## 2020-04-24 LAB — FIBRINOGEN: Fibrinogen: 733 mg/dL — ABNORMAL HIGH (ref 210–475)

## 2020-04-24 LAB — LACTATE DEHYDROGENASE: LDH: 343 U/L — ABNORMAL HIGH (ref 98–192)

## 2020-04-24 LAB — PROCALCITONIN: Procalcitonin: <0.1 ng/mL

## 2020-04-24 LAB — GLUCOSE, CAPILLARY: Glucose-Capillary: 313 mg/dL — ABNORMAL HIGH (ref 70–99)

## 2020-04-24 LAB — LACTIC ACID, PLASMA: Lactic Acid, Venous: 1.4 mmol/L (ref 0.5–1.9)

## 2020-04-24 MED ORDER — INSULIN ASPART 100 UNIT/ML ~~LOC~~ SOLN
0.0000 [IU] | Freq: Every day | SUBCUTANEOUS | Status: DC
  Administered 2020-04-24: 4 [IU] via SUBCUTANEOUS
  Filled 2020-04-24: qty 0.05

## 2020-04-24 MED ORDER — DEXAMETHASONE SODIUM PHOSPHATE 10 MG/ML IJ SOLN: 6.0000 mg | Freq: Two times a day (BID) | INTRAMUSCULAR | Status: DC

## 2020-04-24 MED ORDER — BARICITINIB 2 MG PO TABS
4.0000 mg | ORAL_TABLET | Freq: Every day | ORAL | Status: AC
  Administered 2020-04-24 – 2020-05-07 (×14): 4 mg via ORAL
  Filled 2020-04-24 (×14): qty 2

## 2020-04-24 MED ORDER — ORAL CARE MOUTH RINSE
15.0000 mL | Freq: Two times a day (BID) | OROMUCOSAL | Status: DC
  Administered 2020-04-24 – 2020-05-29 (×55): 15 mL via OROMUCOSAL

## 2020-04-24 MED ORDER — DEXAMETHASONE SODIUM PHOSPHATE 10 MG/ML IJ SOLN: 6.0000 mg | INTRAMUSCULAR | Status: DC

## 2020-04-24 MED ORDER — METHYLPREDNISOLONE SODIUM SUCC 125 MG IJ SOLR
125.0000 mg | Freq: Once | INTRAMUSCULAR | Status: AC
  Administered 2020-04-24: 125 mg via INTRAVENOUS
  Filled 2020-04-24: qty 2

## 2020-04-24 MED ORDER — INSULIN ASPART 100 UNIT/ML ~~LOC~~ SOLN
6.0000 [IU] | Freq: Three times a day (TID) | SUBCUTANEOUS | Status: DC
  Administered 2020-04-24 – 2020-04-25 (×3): 6 [IU] via SUBCUTANEOUS
  Filled 2020-04-24: qty 0.06

## 2020-04-24 MED ORDER — HYDROCOD POLST-CPM POLST ER 10-8 MG/5ML PO SUER
5.0000 mL | Freq: Two times a day (BID) | ORAL | Status: DC | PRN
  Administered 2020-04-24 – 2020-04-27 (×4): 5 mL via ORAL
  Filled 2020-04-24 (×4): qty 5

## 2020-04-24 MED ORDER — SODIUM CHLORIDE 0.9 % IV SOLN
100.0000 mg | Freq: Every day | INTRAVENOUS | Status: AC
  Administered 2020-04-25 – 2020-04-28 (×4): 100 mg via INTRAVENOUS
  Filled 2020-04-24 (×4): qty 20

## 2020-04-24 MED ORDER — SODIUM CHLORIDE 0.9 % IV SOLN: 200.0000 mg | Freq: Once | INTRAVENOUS | Status: DC

## 2020-04-24 MED ORDER — METHYLPREDNISOLONE SODIUM SUCC 125 MG IJ SOLR
110.0000 mg | Freq: Two times a day (BID) | INTRAMUSCULAR | Status: DC
  Administered 2020-04-25 – 2020-05-04 (×19): 110 mg via INTRAVENOUS
  Filled 2020-04-24 (×19): qty 2

## 2020-04-24 MED ORDER — SODIUM CHLORIDE 0.9 % IV SOLN
200.0000 mg | Freq: Once | INTRAVENOUS | Status: AC
  Administered 2020-04-24: 200 mg via INTRAVENOUS
  Filled 2020-04-24: qty 200

## 2020-04-24 MED ORDER — SODIUM CHLORIDE 0.9 % IV SOLN: 100.0000 mg | Freq: Every day | INTRAVENOUS | Status: DC

## 2020-04-24 MED ORDER — GUAIFENESIN-DM 100-10 MG/5ML PO SYRP
10.0000 mL | ORAL_SOLUTION | ORAL | Status: DC | PRN
  Administered 2020-04-25 – 2020-04-26 (×2): 10 mL via ORAL
  Filled 2020-04-24 (×3): qty 10

## 2020-04-24 MED ORDER — ALBUTEROL SULFATE HFA 108 (90 BASE) MCG/ACT IN AERS
2.0000 | INHALATION_SPRAY | Freq: Four times a day (QID) | RESPIRATORY_TRACT | Status: DC
  Administered 2020-04-24 – 2020-04-25 (×3): 2 via RESPIRATORY_TRACT
  Filled 2020-04-24: qty 6.7

## 2020-04-24 MED ORDER — INSULIN ASPART 100 UNIT/ML ~~LOC~~ SOLN
0.0000 [IU] | Freq: Three times a day (TID) | SUBCUTANEOUS | Status: DC
  Administered 2020-04-24 – 2020-04-25 (×3): 11 [IU] via SUBCUTANEOUS
  Filled 2020-04-24: qty 0.2

## 2020-04-24 MED ORDER — DEXAMETHASONE SODIUM PHOSPHATE 10 MG/ML IJ SOLN
10.0000 mg | Freq: Once | INTRAMUSCULAR | Status: AC
  Administered 2020-04-24: 10 mg via INTRAVENOUS
  Filled 2020-04-24: qty 1

## 2020-04-24 MED ORDER — CHLORHEXIDINE GLUCONATE CLOTH 2 % EX PADS
6.0000 | MEDICATED_PAD | Freq: Every day | CUTANEOUS | Status: DC
  Administered 2020-04-24 – 2020-05-20 (×27): 6 via TOPICAL

## 2020-04-24 MED ORDER — ENOXAPARIN SODIUM 60 MG/0.6ML ~~LOC~~ SOLN
55.0000 mg | SUBCUTANEOUS | Status: DC
  Administered 2020-04-24 – 2020-04-27 (×4): 55 mg via SUBCUTANEOUS
  Filled 2020-04-24 (×4): qty 0.55

## 2020-04-24 NOTE — ED Triage Notes (Signed)
C/C SOB, BIB EMS, symptoms started last Tuesday, tachypnic with diminished lung sounds bilaterally. Wife got tested for COVID-19, but has not gotten the results. "I think I have COVID."

## 2020-04-24 NOTE — ED Notes (Signed)
Report given to Allison, RN

## 2020-04-24 NOTE — ED Notes (Signed)
Respiratory called for low oxygen saturation. Patient reminded to keep HFNC in his nose, without it his oxygen is noted to be in the 70s.

## 2020-04-24 NOTE — H&P (Signed)
History and Physical    Clayton [name] FTD:[id number] DOB: May 01, 1956 DOA: 04/24/2020  PCP: Kristen Loader, FNP Patient coming from: Home  Chief Complaint: Shortness of breath  HPI: Clayton [name] is a 64 y.o. male with medical history significant of asthma, obesity, sleep apnea, type 2 diabetes, admitted with severe shortness of breath cough fever of 10 2-1 04 at home.  He began having the symptoms on September 13.  He lives with his wife and mother-in-law who are also in the ER being evaluated.  Patient did not take Covid vaccine.  He is Covid positive today.  He complains of fever chills cough increasing shortness of breath and was found to be severely hypoxic at 72% on room air today by the EMS.  He also complains of diarrhea decreased appetite no nausea vomiting reported.  No abdominal pain chest pain reported.    ED Course: He was 72% on room air when EMS picked him up he was placed on 15 L of oxygen now saturating 90%.  He received Decadron remdesivir. Vital signs in the ER blood pressure 128/58, temperature 100.8 pulse 74 respiration 24.  Sodium 130 potassium 3.3 blood sugar was 245 BUN 22 creatinine 0.99 LFTs elevated AST of 59 albumin 3.2 LDH 343 triglycerides 163 ferritin 1475 CRP 19, lactic acid 1.4 procalcitonin less than 0.10, white count 8.2 hemoglobin 12.9 platelet count normal.  D-dimer 0.60 fibrinogen 733 glucose 245. Chest x-ray bilateral multifocal pneumonia  Review of Systems: As per HPI otherwise all other systems reviewed and are negative  Ambulatory Status: At baseline he is ambulatory.  Past Medical History:  Diagnosis Date  . Arthritis   . Asthma    pulmonary allergies- no asthma per pt  . Atrophic condition of skin   . Cellulitis/abscess - trunk   . Cyst    shoulder  . Hypertrophic condition of skin   . Lipoma   . Obesity   . Pneumonia    hx child  . Sleep apnea    no cpap  . Stones in the urinary tract   . Vitamin D deficiency     Past Surgical  History:  Procedure Laterality Date  . cyst removed from [place]  . INSERTION OF MESH N/A 05/23/2015   Procedure: INSERTION OF MESH;  Surgeon: [name], MD;  Location: [place];  Service: General;  Laterality: N/A;  . MASS EXCISION  07/15/11   left shoulder mass   . NASAL SINUS SURGERY---UVULA REMOVED  2004   Dr. Wilburn Cornelia  . PROSTATE BIOPSY  9/16  . TONSILLECTOMY    . UMBILICAL HERNIA REPAIR N/A 05/23/2015   Procedure: LAPAROSCOPIC UMBILICAL HERNIA REPAIR ;  Surgeon: [name], MD;  Location: [facility];  Service: General;  Laterality: N/A;    Social History   Socioeconomic History  . Marital status: Married    Spouse name: Not on file  . Number of children: Not on file  . Years of education: Not on file  . Highest education level: Not on file  Occupational History  . Occupation: Chief of Staff: [employer]  Tobacco Use  . Smoking status: Never Smoker  . Smokeless tobacco: Never Used  Substance and Sexual Activity  . Alcohol use: Not Currently    Alcohol/week: 10.0 standard drinks    Types: 10 Cans of beer per week  . Drug use: No  . Sexual activity: Not Currently  Other Topics Concern  . Not on file  Social History Narrative  .  Not on file   Social Determinants of Health   Financial Resource Strain:   . Difficulty of Paying Living Expenses: Not on file  Food Insecurity:   . Worried About Charity fundraiser in the Last Year: Not on file  . Ran Out of Food in the Last Year: Not on file  Transportation Needs:   . Lack of Transportation (Medical): Not on file  . Lack of Transportation (Non-Medical): Not on file  Physical Activity:   . Days of Exercise per Week: Not on file  . Minutes of Exercise per Session: Not on file  Stress:   . Feeling of Stress : Not on file  Social Connections:   . Frequency of Communication with Friends and Family: Not on file  . Frequency of Social Gatherings with Friends and Family: Not on file  . Attends  Religious Services: Not on file  . Active Member of Clubs or Organizations: Not on file  . Attends Archivist Meetings: Not on file  . Marital Status: Not on file  Intimate Partner Violence:   . Fear of Current or Ex-Partner: Not on file  . Emotionally Abused: Not on file  . Physically Abused: Not on file  . Sexually Abused: Not on file    No Known Allergies  Family History  Problem Relation Age of Onset  . Heart attack Father   . Heart disease Father        heart attack  . Allergies Mother   . Asthma Mother       Prior to Admission medications   Medication Sig Start Date End Date Taking? Authorizing Provider  atorvastatin (LIPITOR) 40 MG tablet Take 40 mg by mouth daily.  04/12/20  Yes [provider]  methocarbamol (ROBAXIN) 500 MG tablet Take 500 mg by mouth 4 (four) times daily as needed for muscle spasms.  03/09/20  Yes [provider]  doxycycline (VIBRAMYCIN) 100 MG capsule Take 1 capsule (100 mg total) by mouth 2 (two) times daily. Patient not taking: Reported on 04/24/2020 08/07/15   Jaynee Eagles, PA-C  meloxicam (MOBIC) 15 MG tablet TAKE ONE TABLET BY MOUTH DAILY Patient not taking: Reported on 04/24/2020 08/14/17   Gardiner Barefoot, DPM  traMADol (ULTRAM) 50 MG tablet Take 1 tablet (50 mg total) by mouth every 8 (eight) hours as needed. 08/04/15   Jaynee Eagles, PA-C    Physical Exam: Vitals:   04/24/20 1530 04/24/20 1550 04/24/20 1553 04/24/20 1616  BP: (!) 152/81   (!) 125/58  Pulse: 85 80 80 76  Resp: (!) 23 (!) 33 (!) 33 (!) 24  Temp:      TempSrc:      SpO2: 91% 92% 93% 95%  Weight:      Height:         . General: Appears calm and comfortable . Eyes: PERRL, EOMI, normal lids, iris . ENT:  grossly normal hearing, lips & tongue, mmm . Neck: no LAD, masses or thyromegaly . Cardiovascular:  RRR, no m/r/g. No LE edema.  Marland Kitchen Respiratory: Breath sounds bilaterally, no w/r/r. Normal respiratory effort. . Abdomen:  soft, nt, NABS  distended . Skin:  no rash or induration seen on limited exam . Musculoskeletal:  grossly normal tone BUE/BLE, good ROM, no bony abnormality.  No edema . Psychiatric:  grossly normal mood and affect, speech fluent and appropriate, AOx3 . Neurologic: CN 2-12 grossly intact, moves all extremities in coordinated fashion, sensation intact  Labs on Admission: I have personally  reviewed following labs and imaging studies  CBC: Recent Labs  Lab 04/24/20 1405  WBC 8.2  NEUTROABS 6.6  HGB 12.9*  HCT 39.0  MCV 90.7  PLT 782   Basic Metabolic Panel: Recent Labs  Lab 04/24/20 1405  NA 130*  K 3.3*  CL 95*  CO2 25  GLUCOSE 245*  BUN 22  CREATININE 0.99  CALCIUM 7.7*   GFR: Estimated Creatinine Clearance: 95.1 mL/min (by C-G formula based on SCr of 0.99 mg/dL). Liver Function Tests: Recent Labs  Lab 04/24/20 1405  AST 59*  ALT 37  ALKPHOS 43  BILITOT 0.7  PROT 7.1  ALBUMIN 3.2*   No results for input(s): LIPASE, AMYLASE in the last 168 hours. No results for input(s): AMMONIA in the last 168 hours. Coagulation Profile: No results for input(s): INR, PROTIME in the last 168 hours. Cardiac Enzymes: No results for input(s): CKTOTAL, CKMB, CKMBINDEX, TROPONINI in the last 168 hours. BNP (last 3 results) No results for input(s): PROBNP in the last 8760 hours. HbA1C: No results for input(s): HGBA1C in the last 72 hours. CBG: Recent Labs  Lab 04/24/20 1452  GLUCAP 253*   Lipid Profile: Recent Labs    04/24/20 1405  TRIG 163*   Thyroid Function Tests: No results for input(s): TSH, T4TOTAL, FREET4, T3FREE, THYROIDAB in the last 72 hours. Anemia Panel: Recent Labs    04/24/20 1405  FERRITIN 1,475*   Urine analysis:    Component Value Date/Time   COLORURINE YELLOW 12/22/2007 1120   APPEARANCEUR CLEAR 12/22/2007 1120   LABSPEC 1.011 12/22/2007 1120   PHURINE 6.0 12/22/2007 1120   GLUCOSEU 100 (A) 12/22/2007 1120   HGBUR MODERATE (A) 12/22/2007 1120   HGBUR  negative 12/21/2007 0000   BILIRUBINUR NEGATIVE 12/22/2007 1120   KETONESUR NEGATIVE 12/22/2007 1120   PROTEINUR NEGATIVE 12/22/2007 1120   UROBILINOGEN 0.2 12/22/2007 1120   NITRITE NEGATIVE 12/22/2007 1120   LEUKOCYTESUR NEGATIVE 12/22/2007 1120    Creatinine Clearance: Estimated Creatinine Clearance: 95.1 mL/min (by C-G formula based on SCr of 0.99 mg/dL).  Sepsis Labs: @LABRCNTIP (procalcitonin:4,lacticidven:4) ) Recent Results (from the past 240 hour(s))  SARS Coronavirus 2 by RT PCR (hospital order, performed in [facility] hospital lab) Nasopharyngeal Nasopharyngeal Swab     Status: Abnormal   Collection Time: 04/24/20  2:06 PM   Specimen: Nasopharyngeal Swab  Result Value Ref Range Status   SARS Coronavirus 2 POSITIVE (A) NEGATIVE Final    Comment: CRITICAL RESULT CALLED TO, READ BACK BY AND VERIFIED WITH: K.ZULETA,RN [id number] @1542  BY V.WILKINS (NOTE) SARS-CoV-2 target nucleic acids are DETECTED  SARS-CoV-2 RNA is generally detectable in upper respiratory specimens  during the acute phase of infection.  Positive results are indicative  of the presence of the identified virus, but do not rule out bacterial infection or co-infection with other pathogens not detected by the test.  Clinical correlation with patient history and  other diagnostic information is necessary to determine patient infection status.  The expected result is negative.  Fact Sheet for Patients:   StrictlyIdeas.no   Fact Sheet for Healthcare Providers:   BankingDealers.co.za    This test is not yet approved or cleared by the [place] FDA and  has been authorized for detection and/or diagnosis of SARS-CoV-2 by FDA under an Emergency Use Authorization (EUA).  This EUA will remain in effect (mea ning this test can be used) for the duration of  the COVID-19 declaration under Section 564(b)(1) of the Act, 21 U.S.C. section 360-bbb-3(b)(1), unless the  authorization is terminated or revoked sooner.  Performed at [facility], [place] [address] [place], [place] [address]      Radiological Exams on Admission: DG Chest Port 1 View  Result Date: 04/24/2020 CLINICAL DATA:  Respiratory failure. EXAM: PORTABLE CHEST 1 VIEW COMPARISON:  August 03, 2010. FINDINGS: The heart size and mediastinal contours are within normal limits. No pneumothorax or pleural effusion is noted. Multiple patchy airspace opacities are noted bilaterally consistent with multifocal pneumonia. The visualized skeletal structures are unremarkable. IMPRESSION: Bilateral multifocal pneumonia. Electronically Signed   By: Marijo Conception M.D.   On: 04/24/2020 14:47    Assessment/Plan Active Problems:   Pneumonia due to COVID-19 virus    #1 Covid pneumonia patient began having his symptoms 7 days prior to this admission.  He is admitted today with fever chills cough shortness of breath and was found to be severely hypoxic in the 70s when EMS picked him up.  He is now on 15 L of oxygen.  His inflammatory markers are elevated chest x-ray with bilateral multifocal pneumonia. Covid admission order set initiated. His procalcitonin is low we will hold off on any antibiotics at this time.  #2 hyperglycemia patient has no history of diabetes.  Cover with sliding scale insulin while on steroids.  #3 hypokalemia likely due to diarrhea replete  #4 hyperlipidemia on Lipitor at home  #5 history of obstructive sleep apnea not on CPAP at home  #6 mild hyponatremia monitor labs in a.m. likely due to Covid  #7 mild elevation in AST monitor  Severity of Illness: The appropriate patient status for this patient is INPATIENT. Inpatient status is judged to be reasonable and necessary in order to provide the required intensity of service to ensure the patient's safety. The patient's presenting symptoms, physical exam findings, and initial radiographic and laboratory data in  the context of their chronic comorbidities is felt to place them at high risk for further clinical deterioration. Furthermore, it is not anticipated that the patient will be medically stable for discharge from the hospital within 2 midnights of admission. The following factors support the patient status of inpatient.   " The patient's presenting symptoms include severe shortness of breath fever chills cough diarrhea decreased p.o. intake " The worrisome physical exam findings include bilateral rhonchi and severe hypoxia " The initial radiographic and laboratory data are worrisome because of multifocal pneumonia. " The chronic co-morbidities include obesity obstructive sleep apnea asthma   * I certify that at the point of admission it is my clinical judgment that the patient will require inpatient hospital care spanning beyond 2 midnights from the point of admission due to high intensity of service, high risk for further deterioration and high frequency of surveillance required.*    Estimated body mass index is 36.18 kg/m as calculated from the following:   Height as of this encounter: 5\' 9"  (1.753 m).   Weight as of this encounter: 111.1 kg.   DVT prophylaxis: Lovenox Code Status: Full code Family Communication: None at bedside Disposition Plan: Pending clinical improvement Consults called: None Admission status: Inpatient   Georgette Shell MD   04/24/2020, 4:34 PM

## 2020-04-24 NOTE — ED Provider Notes (Signed)
[facility] DEPT Provider Note   CSN: [id number] Arrival date & time: 04/24/20  1345     History Chief Complaint  Patient presents with  . Code Sepsis  . Shortness of Breath    Clayton [name] is a 64 y.o. male with a past medical history of diabetes, obesity, sleep apnea without CPAP treatment, vitamin D deficiency and asthma who presents emergency department with hypoxic respiratory failure.  He is suspected to have coronavirus.  Patient is not vaccinated.  He began having symptoms on 13 September which included cough, body aches, fever, chills, diarrhea and weakness.  He has had progressively worsening shortness of breath.  EMS reports that the patient had oxygen saturations of 72% on room air today with capnography at 22.  The patient was placed on 15 L via nasal cannula and they were able to bring him only up to about 90%.  Tachypneic into the 40s.  His wife is also symptomatic.  He has a 60 year old mother-in-law who lives in the home.  Remainder of the history is limited due to respiratory distress.  HPI     Past Medical History:  Diagnosis Date  . Arthritis   . Asthma    pulmonary allergies- no asthma per pt  . Atrophic condition of skin   . Cellulitis/abscess - trunk   . Cyst    shoulder  . Hypertrophic condition of skin   . Lipoma   . Obesity   . Pneumonia    hx child  . Sleep apnea    no cpap  . Stones in the urinary tract   . Vitamin D deficiency     Patient Active Problem List   Diagnosis Date Noted  . S/P hernia repair 05/23/2015  . Chronic cough 06/25/2011  . LIPOMA, left shoulder. 04/04/2008  . ALLERGIC RHINITIS 12/21/2007  . GERD 12/21/2007  . NEPHROLITHIASIS, HX OF 12/21/2007    Past Surgical History:  Procedure Laterality Date  . cyst removed from [place]  . INSERTION OF MESH N/A 05/23/2015   Procedure: INSERTION OF MESH;  Surgeon: [name], MD;  Location: [place];  Service: General;  Laterality: N/A;   . MASS EXCISION  07/15/11   left shoulder mass   . NASAL SINUS SURGERY---UVULA REMOVED  2004   Dr. Wilburn Cornelia  . PROSTATE BIOPSY  9/16  . TONSILLECTOMY    . UMBILICAL HERNIA REPAIR N/A 05/23/2015   Procedure: LAPAROSCOPIC UMBILICAL HERNIA REPAIR ;  Surgeon: [name], MD;  Location: [facility];  Service: General;  Laterality: N/A;       Family History  Problem Relation Age of Onset  . Heart attack Father   . Heart disease Father        heart attack  . Allergies Mother   . Asthma Mother     Social History   Tobacco Use  . Smoking status: Never Smoker  . Smokeless tobacco: Never Used  Substance Use Topics  . Alcohol use: Not Currently    Alcohol/week: 10.0 standard drinks    Types: 10 Cans of beer per week  . Drug use: No    Home Medications Prior to Admission medications   Medication Sig Start Date End Date Taking? Authorizing Provider  doxycycline (VIBRAMYCIN) 100 MG capsule Take 1 capsule (100 mg total) by mouth 2 (two) times daily. 08/07/15   Jaynee Eagles, PA-C  meloxicam (MOBIC) 15 MG tablet TAKE ONE TABLET BY MOUTH DAILY 08/14/17   Gardiner Barefoot, DPM  traMADol Veatrice Bourbon)  50 MG tablet Take 1 tablet (50 mg total) by mouth every 8 (eight) hours as needed. 08/04/15   Jaynee Eagles, PA-C    Allergies    Patient has no known allergies.  Review of Systems   Review of Systems Ten systems reviewed and are negative for acute change, except as noted in the HPI.   Physical Exam Updated Vital Signs BP (!) 150/82   Pulse 90   Temp (!) 100.8 F (38.2 C) (Oral)   Resp (!) 25   Ht 5\' 9"  (1.753 m)   Wt 111.1 kg   SpO2 (!) 86%   BMI 36.18 kg/m   Physical Exam Vitals and nursing note reviewed.  Constitutional:      General: He is not in acute distress.    Appearance: He is well-developed. He is ill-appearing. He is not diaphoretic.  HENT:     Head: Normocephalic and atraumatic.  Eyes:     General: No scleral icterus.    Conjunctiva/sclera: Conjunctivae normal.   Cardiovascular:     Rate and Rhythm: Normal rate and regular rhythm.     Heart sounds: Normal heart sounds.  Pulmonary:     Effort: Tachypnea and respiratory distress present.     Breath sounds: Examination of the right-middle field reveals rales. Examination of the left-middle field reveals rales. Examination of the right-lower field reveals rales. Examination of the left-lower field reveals rales. Rales present.     Comments: Productive cough Abdominal:     Palpations: Abdomen is soft.     Tenderness: There is no abdominal tenderness.  Musculoskeletal:     Cervical back: Normal range of motion and neck supple.  Skin:    General: Skin is warm and dry.  Neurological:     Mental Status: He is alert.  Psychiatric:        Behavior: Behavior normal.     ED Results / Procedures / Treatments   Labs (all labs ordered are listed, but only abnormal results are displayed) Labs Reviewed  SARS CORONAVIRUS 2 BY RT PCR (HOSPITAL ORDER, [place] [facility])  CULTURE, BLOOD (ROUTINE X 2)  CULTURE, BLOOD (ROUTINE X 2)  LACTIC ACID, PLASMA  LACTIC ACID, PLASMA  CBC WITH DIFFERENTIAL/PLATELET  COMPREHENSIVE METABOLIC PANEL  D-DIMER, QUANTITATIVE (NOT AT [facility])  PROCALCITONIN  LACTATE DEHYDROGENASE  FERRITIN  TRIGLYCERIDES  FIBRINOGEN  C-REACTIVE PROTEIN    EKG None  Radiology No results found.  Procedures Procedures (including critical care time)  Medications Ordered in ED Medications - No data to display  ED Course  I have reviewed the triage vital signs and the nursing notes.  Pertinent labs & imaging results that were available during my care of the patient were reviewed by me and considered in my medical decision making (see chart for details).    MDM Rules/Calculators/A&P                          CC:sob VS: BP (!) 143/86   Pulse 81   Temp (!) 100.8 F (38.2 C) (Oral)   Resp (!) 26   Ht 5\' 9"  (1.753 m)   Wt 111.1 kg   SpO2 93%   BMI 36.18  kg/m  WU:XLKGMWN is gathered by patient and ems. Previous records obtained and reviewed. DDX:The patient's complaint of sob involves an extensive number of diagnostic and treatment options, and is a complaint that carries with it a high risk of complications, morbidity, and potential mortality.  Given the large differential diagnosis, medical decision making is of high complexity. The emergent differential diagnosis for shortness of breath includes, but is not limited to, Pulmonary edema, bronchoconstriction, Pneumonia, Pulmonary embolism, Pneumotherax/ Hemothorax, Dysrythmia, ACS.   Labs: I ordered reviewed and interpreted labs which include Pos Covid 19 Elevated inflammatory markers (d-dimer, ferritin, LDH, Triglycerides, fibrinogen, CRP, CMP with elevated blood glucose, elevated AST albumin, mild hypocalcemia, hypokalemia.  Low sodium and chloride in the setting of elevated blood sugar.  CBC without elevated white blood cell count.  Mildly anemia is normocytic and lymphopenia in the setting of COVID-19 infection. Lactic acid is within normal limits Imaging: I ordered and reviewed images which included chest x-ray. I independently visualized and interpreted all imaging. Significant findings include multifocal pneumonia.  EKG: NSR 89 Consults: BMZ:[id number] admitted for hypoxic resp failure from COVID- 19 pneumonia Patient disposition:admit  The patient appears reasonably stabilized for admission considering the current resources, flow, and capabilities available in the ED at this time, and I doubt any other [facility] requiring further screening and/or treatment in the ED prior to admission.     Chon Buhl was evaluated in Emergency Department on 04/24/2020 for the symptoms described in the history of present illness. He was evaluated in the context of the global COVID-19 pandemic, which necessitated consideration that the patient might be at risk for infection with the SARS-CoV-2 virus that causes  COVID-19. Institutional protocols and algorithms that pertain to the evaluation of patients at risk for COVID-19 are in a state of rapid change based on information released by regulatory bodies including the CDC and federal and state organizations. These policies and algorithms were followed during the patient's care in the ED.  Final Clinical Impression(s) / ED Diagnoses Final diagnoses:  Multifocal pneumonia  Acute hypoxemic respiratory failure due to COVID-19 [facility])    Rx / DC Orders ED Discharge Orders    None       Margarita Mail, PA-C 04/24/20 1829    Valarie Merino, MD 04/26/20 1315

## 2020-04-24 NOTE — Progress Notes (Signed)
RT Note: Pt. placed on (Heated)HFNC @ this time with settings of 40 lpm/100% FI02 due to <</=85% oxygen sats while on both([place]) HFNC @15  lpm & NRB Mask @15  lpm, NRB mask remains in place overtop to help achieve better Oxygen saturations, RN obtained order, pt. tolerating current settings well.

## 2020-04-25 DIAGNOSIS — E1165 Type 2 diabetes mellitus with hyperglycemia: Secondary | ICD-10-CM | POA: Diagnosis present

## 2020-04-25 LAB — COMPREHENSIVE METABOLIC PANEL
ALT: 43 U/L (ref 0–44)
AST: 62 U/L — ABNORMAL HIGH (ref 15–41)
Albumin: 3.3 g/dL — ABNORMAL LOW (ref 3.5–5.0)
Alkaline Phosphatase: 41 U/L (ref 38–126)
Anion gap: 11 (ref 5–15)
BUN: 25 mg/dL — ABNORMAL HIGH (ref 8–23)
CO2: 26 mmol/L (ref 22–32)
Calcium: 8.2 mg/dL — ABNORMAL LOW (ref 8.9–10.3)
Chloride: 98 mmol/L (ref 98–111)
Creatinine, Ser: 1.08 mg/dL (ref 0.61–1.24)
GFR calc Af Amer: >60 mL/min (ref >60)
GFR calc non Af Amer: >60 mL/min (ref >60)
Glucose, Bld: 332 mg/dL — ABNORMAL HIGH (ref 70–99)
Potassium: 4 mmol/L (ref 3.5–5.1)
Sodium: 135 mmol/L (ref 135–145)
Total Bilirubin: 0.5 mg/dL (ref 0.3–1.2)
Total Protein: 7.1 g/dL (ref 6.5–8.1)

## 2020-04-25 LAB — FERRITIN: Ferritin: 1657 ng/mL — ABNORMAL HIGH (ref 24–336)

## 2020-04-25 LAB — CBC WITH DIFFERENTIAL/PLATELET
Abs Immature Granulocytes: 0.03 10*3/uL (ref 0.00–0.07)
Basophils Absolute: 0 10*3/uL (ref 0.0–0.1)
Basophils Relative: 0 %
Eosinophils Absolute: 0 10*3/uL (ref 0.0–0.5)
Eosinophils Relative: 0 %
HCT: 39.4 % (ref 39.0–52.0)
Hemoglobin: 13 g/dL (ref 13.0–17.0)
Immature Granulocytes: 1 %
Lymphocytes Relative: 11 %
Lymphs Abs: 0.5 10*3/uL — ABNORMAL LOW (ref 0.7–4.0)
MCH: 30.3 pg (ref 26.0–34.0)
MCHC: 33 g/dL (ref 30.0–36.0)
MCV: 91.8 fL (ref 80.0–100.0)
Monocytes Absolute: 0.3 10*3/uL (ref 0.1–1.0)
Monocytes Relative: 7 %
Neutro Abs: 3.6 10*3/uL (ref 1.7–7.7)
Neutrophils Relative %: 81 %
Platelets: 324 10*3/uL (ref 150–400)
RBC: 4.29 MIL/uL (ref 4.22–5.81)
RDW: 13.1 % (ref 11.5–15.5)
WBC: 4.3 10*3/uL (ref 4.0–10.5)
nRBC: 0 % (ref 0.0–0.2)

## 2020-04-25 LAB — LIPID PANEL
Cholesterol: 169 mg/dL (ref 0–200)
HDL: 32 mg/dL — ABNORMAL LOW (ref >40)
LDL Cholesterol: 112 mg/dL — ABNORMAL HIGH (ref 0–99)
Total CHOL/HDL Ratio: 5.3 RATIO
Triglycerides: 123 mg/dL (ref <150)
VLDL: 25 mg/dL (ref 0–40)

## 2020-04-25 LAB — GLUCOSE, CAPILLARY
Glucose-Capillary: 290 mg/dL — ABNORMAL HIGH (ref 70–99)
Glucose-Capillary: 287 mg/dL — ABNORMAL HIGH (ref 70–99)
Glucose-Capillary: 331 mg/dL — ABNORMAL HIGH (ref 70–99)
Glucose-Capillary: 257 mg/dL — ABNORMAL HIGH (ref 70–99)

## 2020-04-25 LAB — LACTATE DEHYDROGENASE: LDH: 377 U/L — ABNORMAL HIGH (ref 98–192)

## 2020-04-25 LAB — HEPATITIS PANEL, ACUTE
HCV Ab: NONREACTIVE
Hep A IgM: NONREACTIVE
Hep B C IgM: NONREACTIVE
Hepatitis B Surface Ag: NONREACTIVE

## 2020-04-25 LAB — HEMOGLOBIN A1C
Hgb A1c MFr Bld: 8.4 % — ABNORMAL HIGH (ref 4.8–5.6)
Mean Plasma Glucose: 194.38 mg/dL

## 2020-04-25 LAB — PHOSPHORUS: Phosphorus: 3.8 mg/dL (ref 2.5–4.6)

## 2020-04-25 LAB — C-REACTIVE PROTEIN: CRP: 20 mg/dL — ABNORMAL HIGH (ref <1.0)

## 2020-04-25 LAB — MAGNESIUM: Magnesium: 2.8 mg/dL — ABNORMAL HIGH (ref 1.7–2.4)

## 2020-04-25 LAB — D-DIMER, QUANTITATIVE: D-Dimer, Quant: 0.74 ug/mL-FEU — ABNORMAL HIGH (ref 0.00–0.50)

## 2020-04-25 MED ORDER — IPRATROPIUM-ALBUTEROL 20-100 MCG/ACT IN AERS
1.0000 | INHALATION_SPRAY | Freq: Four times a day (QID) | RESPIRATORY_TRACT | Status: DC
  Administered 2020-04-25 – 2020-04-26 (×3): 1 via RESPIRATORY_TRACT
  Filled 2020-04-25: qty 4

## 2020-04-25 MED ORDER — METHOCARBAMOL 500 MG PO TABS
500.0000 mg | ORAL_TABLET | Freq: Four times a day (QID) | ORAL | Status: DC | PRN
  Administered 2020-04-29 – 2020-05-26 (×3): 500 mg via ORAL
  Filled 2020-04-25 (×3): qty 1

## 2020-04-25 MED ORDER — PROSOURCE PLUS PO LIQD
30.0000 mL | Freq: Two times a day (BID) | ORAL | Status: DC
  Administered 2020-04-27 – 2020-05-21 (×28): 30 mL via ORAL
  Filled 2020-04-25 (×30): qty 30

## 2020-04-25 MED ORDER — INSULIN DETEMIR 100 UNIT/ML ~~LOC~~ SOLN
8.0000 [IU] | Freq: Two times a day (BID) | SUBCUTANEOUS | Status: DC
  Administered 2020-04-25: 8 [IU] via SUBCUTANEOUS
  Filled 2020-04-25: qty 0.08

## 2020-04-25 MED ORDER — LOSARTAN POTASSIUM 50 MG PO TABS
50.0000 mg | ORAL_TABLET | Freq: Every day | ORAL | Status: DC
  Administered 2020-04-25 – 2020-05-23 (×29): 50 mg via ORAL
  Filled 2020-04-25 (×29): qty 1

## 2020-04-25 MED ORDER — ADULT MULTIVITAMIN W/MINERALS CH
1.0000 | ORAL_TABLET | Freq: Every day | ORAL | Status: DC
  Administered 2020-04-25 – 2020-04-29 (×5): 1 via ORAL
  Filled 2020-04-25 (×5): qty 1

## 2020-04-25 MED ORDER — INSULIN ASPART 100 UNIT/ML ~~LOC~~ SOLN
0.0000 [IU] | SUBCUTANEOUS | Status: DC
  Administered 2020-04-25: 11 [IU] via SUBCUTANEOUS
  Administered 2020-04-25: 8 [IU] via SUBCUTANEOUS

## 2020-04-25 MED ORDER — INSULIN ASPART 100 UNIT/ML ~~LOC~~ SOLN
0.0000 [IU] | SUBCUTANEOUS | Status: DC
  Administered 2020-04-26: 5 [IU] via SUBCUTANEOUS
  Administered 2020-04-26: 11 [IU] via SUBCUTANEOUS
  Administered 2020-04-26: 8 [IU] via SUBCUTANEOUS

## 2020-04-25 MED ORDER — KATE FARMS STANDARD 1.4 PO LIQD
325.0000 mL | Freq: Two times a day (BID) | ORAL | Status: DC
  Administered 2020-04-25 – 2020-05-08 (×22): 325 mL via ORAL
  Filled 2020-04-25 (×35): qty 325

## 2020-04-25 MED ORDER — INSULIN ASPART 100 UNIT/ML ~~LOC~~ SOLN: 0.0000 [IU] | Freq: Three times a day (TID) | SUBCUTANEOUS | Status: DC

## 2020-04-25 MED ORDER — ZINC SULFATE 220 (50 ZN) MG PO CAPS
220.0000 mg | ORAL_CAPSULE | Freq: Every day | ORAL | Status: DC
  Administered 2020-04-25 – 2020-05-29 (×35): 220 mg via ORAL
  Filled 2020-04-25 (×35): qty 1

## 2020-04-25 MED ORDER — ASCORBIC ACID 500 MG PO TABS
500.0000 mg | ORAL_TABLET | Freq: Every day | ORAL | Status: DC
  Administered 2020-04-25 – 2020-05-29 (×35): 500 mg via ORAL
  Filled 2020-04-25 (×34): qty 1

## 2020-04-25 MED ORDER — INSULIN ASPART 100 UNIT/ML ~~LOC~~ SOLN
10.0000 [IU] | Freq: Three times a day (TID) | SUBCUTANEOUS | Status: DC
  Administered 2020-04-25 – 2020-04-26 (×2): 10 [IU] via SUBCUTANEOUS

## 2020-04-25 MED ORDER — HYDROCHLOROTHIAZIDE 12.5 MG PO CAPS
12.5000 mg | ORAL_CAPSULE | Freq: Every day | ORAL | Status: DC
  Administered 2020-04-25: 12.5 mg via ORAL
  Filled 2020-04-25: qty 1

## 2020-04-25 MED ORDER — HYDROCHLOROTHIAZIDE 12.5 MG PO CAPS: 12.5000 mg | ORAL_CAPSULE | Freq: Every day | ORAL | Status: DC

## 2020-04-25 NOTE — Progress Notes (Signed)
Patient resting comfortably in bed at this time, no visible resp distress, denies concerns, RR avg 30, currently on 15l NRB and 50 HHFNC sats around 80-82%. Sitting in bed, watching tv, clear/diminished in all fields. Encouraged proning but patient wants to wait until after breakfast.   MD Sherral Hammers notified of current O2 sats, will continue to monitor and notify MD for increased resp distress.

## 2020-04-25 NOTE — Progress Notes (Signed)
Inpatient Diabetes Program Recommendations  AACE/ADA: New Consensus Statement on Inpatient Glycemic Control (2015)  Target Ranges:  Prepandial:   less than 140 mg/dL      Peak postprandial:   less than 180 mg/dL (1-2 hours)      Critically ill patients:  140 - 180 mg/dL   Lab Results  Component Value Date   GLUCAP 287 (H) 04/25/2020   HGBA1C 8.4 (H) 04/25/2020    Review of Glycemic Control  Diabetes history: DM2 Outpatient Diabetes medications: metformin 750 mg bid, glipizide 5 mg QD Current orders for Inpatient glycemic control: Novolog 0-20 units tidwc and hs + 6 units tidwc  CBGs 290, 287 mg/dL.  Inpatient Diabetes Program Recommendations:     Add Levemir 15 units bid Increase Novolog to 8 units tidwc for meal coverage insulin.  Recommend using Covid 19 Glycemic Control Order Set  Will continue to follow.   Thank you. Lorenda Peck, RD, LDN, CDE Inpatient Diabetes Coordinator [phone]

## 2020-04-25 NOTE — Progress Notes (Addendum)
PROGRESS NOTE    Clayton [name]  PQZ:[id number] DOB: October 04, 1955 DOA: 04/24/2020 PCP: Kristen Loader, FNP     Brief Narrative:   64 y.o. WM PMHx asthma, obesity OSA not on CPAP, DM type II  Admitted with severe shortness of breath cough fever of 10 2-1 04 at home.  He began having the symptoms on September 13.  He lives with his wife and mother-in-law who are also in the ER being evaluated.  Patient did not take Covid vaccine.  He is Covid positive today.  He complains of fever chills cough increasing shortness of breath and was found to be severely hypoxic at 72% on room air today by the EMS.  He also complains of diarrhea decreased appetite no nausea vomiting reported.  No abdominal pain chest pain reported.   Subjective: T-max overnight 38.2 C, A/O x4, positive S OB, negative abdominal pain   Assessment & Plan: Covid vaccination; no vaccine   Active Problems:   Pneumonia due to COVID-19 virus   Hypokalemia   Hyperglycemia  SIRS -Upon admission meets criteria for SIRS temp> 38 C, RR greater than 20  Acute respiratory failure with hypoxia/Covid pneumonia COVID-19 Labs  Recent Labs    04/24/20 1405 04/25/20 0253  DDIMER 0.60* 0.74*  FERRITIN 1,475* 1,657*  LDH 343*  --   CRP 19.0* 20.0*    Lab Results  Component Value Date   SARSCOV2NAA POSITIVE (A) 04/24/2020   -Baricitinib 4 mg x 14 days per pharmacy protocol -Solu-Medrol 110 mg BID -Remdesivir per pharmacy protocol -Vitamin C and zinc per Covid protocol -Respimat QID -Flutter valve -Incentive spirometry -Prone patient 8 hours/day; if cannot tolerate prone 2 to 3 hours per shift -Titrate O2 to maintain SPO2> 88%  OSA -Not on CPAP at home  - DM type II with hyperglycemia -9/21 hemoglobin A1c= 8.4 -Levemir 8 units BID -NovoLog 10 units qac -Moderate SSI -Consult to diabetic coordinator -Consult to diabetic nutritionist  Essential HTN -Losartan 50 mg daily -HCTZ 12.5 mg daily  HLD -9/21 LDL=  112 -Upon discharge with increased Lipitor 80 mg daily, hold while being treated for Covid  Hypokalemia -Resolved  OSA -Not on CPAP at home  Hyponatremia -Resolved  Elevated AST -Mildly elevated continue to monitor  Chronic pain syndrome -Robaxin 500 mg QID PRN      DVT prophylaxis: Lovenox Code Status: Full Family Communication:  Status is: Inpatient    Dispo: The patient is from: Home              Anticipated d/c is to: Home              Anticipated d/c date is: 9/26              Patient currently unstable      Consultants:    Procedures/Significant Events:    I have personally reviewed and interpreted all radiology studies and my findings are as above.  VENTILATOR SETTINGS: HFNC+ NRB 9/21 FiO2; 100% Flow; 50 L/min SPO2; 95%    Cultures   Antimicrobials: Anti-infectives (From admission, onward)   Start     Ordered Stop   04/25/20 1000  remdesivir 100 mg in sodium chloride 0.9 % 100 mL IVPB        04/24/20 1547 04/29/20 0959   04/25/20 1000  remdesivir 100 mg in sodium chloride 0.9 % 100 mL IVPB  Status:  Discontinued       "Followed by" Linked Group Details   04/24/20 1633 04/24/20 1635  04/24/20 1645  remdesivir 200 mg in sodium chloride 0.9% 250 mL IVPB  Status:  Discontinued       "Followed by" Linked Group Details   04/24/20 1633 04/24/20 1635   04/24/20 1630  remdesivir 200 mg in sodium chloride 0.9% 250 mL IVPB        04/24/20 1547 04/24/20 1802       Devices    LINES / TUBES:      Continuous Infusions: . remdesivir 100 mg in NS 100 mL       Objective: Vitals:   04/25/20 0440 04/25/20 0500 04/25/20 0530 04/25/20 0600  BP:  132/71  (!) 144/75  Pulse: 63 63 66 64  Resp: (!) 23 (!) 26 (!) 29 (!) 28  Temp:      TempSrc:      SpO2: (!) 87% (!) 84% 95% (!) 89%  Weight:      Height:        Intake/Output Summary (Last 24 hours) at 04/25/2020 2297 Last data filed at 04/25/2020 0300 Gross per 24 hour  Intake 240  ml  Output 450 ml  Net -210 ml   Filed Weights   04/24/20 1400 04/24/20 1905  Weight: 111.1 kg 108.4 kg    Examination:  General: A/O x4, positive acute respiratory distress Eyes: negative scleral hemorrhage, negative anisocoria, negative icterus ENT: Negative Runny nose, negative gingival bleeding, Neck:  Negative scars, masses, torticollis, lymphadenopathy, JVD Lungs: tachypneic, very poor air movement bilaterally without wheezes or crackles Cardiovascular: Regular rate and rhythm without murmur gallop or rub normal S1 and S2 Abdomen: OBESE, negative abdominal pain, nondistended, positive soft, bowel sounds, no rebound, no ascites, no appreciable mass Extremities: No significant cyanosis, clubbing, or edema bilateral lower extremities Skin: Negative rashes, lesions, ulcers Psychiatric:  Negative depression, negative anxiety, negative fatigue, negative mania  Central nervous system:  Cranial nerves II through XII intact, tongue/uvula midline, all extremities muscle strength 5/5, sensation intact throughout, negative dysarthria, negative expressive aphasia, negative receptive aphasia.  .     Data Reviewed: Care during the described time interval was provided by me .  I have reviewed this patient's available data, including medical history, events of note, physical examination, and all test results as part of my evaluation.  CBC: Recent Labs  Lab 04/24/20 1405 04/25/20 0253  WBC 8.2 4.3  NEUTROABS 6.6 3.6  HGB 12.9* 13.0  HCT 39.0 39.4  MCV 90.7 91.8  PLT 279 989   Basic Metabolic Panel: Recent Labs  Lab 04/24/20 1405 04/25/20 0253  NA 130* 135  K 3.3* 4.0  CL 95* 98  CO2 25 26  GLUCOSE 245* 332*  BUN 22 25*  CREATININE 0.99 1.08  CALCIUM 7.7* 8.2*  MG  --  2.8*  PHOS  --  3.8   GFR: Estimated Creatinine Clearance: 86.1 mL/min (by C-G formula based on SCr of 1.08 mg/dL). Liver Function Tests: Recent Labs  Lab 04/24/20 1405 04/25/20 0253  AST 59* 62*    ALT 37 43  ALKPHOS 43 41  BILITOT 0.7 0.5  PROT 7.1 7.1  ALBUMIN 3.2* 3.3*   No results for input(s): LIPASE, AMYLASE in the last 168 hours. No results for input(s): AMMONIA in the last 168 hours. Coagulation Profile: No results for input(s): INR, PROTIME in the last 168 hours. Cardiac Enzymes: No results for input(s): CKTOTAL, CKMB, CKMBINDEX, TROPONINI in the last 168 hours. BNP (last 3 results) No results for input(s): PROBNP in the last 8760 hours. HbA1C: No results  for input(s): HGBA1C in the last 72 hours. CBG: Recent Labs  Lab 04/24/20 1452 04/24/20 1711 04/24/20 2132  GLUCAP 253* 244* 313*   Lipid Profile: Recent Labs    04/24/20 1405  TRIG 163*   Thyroid Function Tests: No results for input(s): TSH, T4TOTAL, FREET4, T3FREE, THYROIDAB in the last 72 hours. Anemia Panel: Recent Labs    04/24/20 1405 04/25/20 0253  FERRITIN 1,475* 1,657*   Sepsis Labs: Recent Labs  Lab 04/24/20 1405  PROCALCITON <0.10  LATICACIDVEN 1.4    Recent Results (from the past 240 hour(s))  SARS Coronavirus 2 by RT PCR (hospital order, performed in [facility] hospital lab) Nasopharyngeal Nasopharyngeal Swab     Status: Abnormal   Collection Time: 04/24/20  2:06 PM   Specimen: Nasopharyngeal Swab  Result Value Ref Range Status   SARS Coronavirus 2 POSITIVE (A) NEGATIVE Final    Comment: CRITICAL RESULT CALLED TO, READ BACK BY AND VERIFIED WITH: K.ZULETA,RN [id number] @1542  BY V.WILKINS (NOTE) SARS-CoV-2 target nucleic acids are DETECTED  SARS-CoV-2 RNA is generally detectable in upper respiratory specimens  during the acute phase of infection.  Positive results are indicative  of the presence of the identified virus, but do not rule out bacterial infection or co-infection with other pathogens not detected by the test.  Clinical correlation with patient history and  other diagnostic information is necessary to determine patient infection status.  The expected result is  negative.  Fact Sheet for Patients:   StrictlyIdeas.no   Fact Sheet for Healthcare Providers:   BankingDealers.co.za    This test is not yet approved or cleared by the [place] FDA and  has been authorized for detection and/or diagnosis of SARS-CoV-2 by FDA under an Emergency Use Authorization (EUA).  This EUA will remain in effect (mea ning this test can be used) for the duration of  the COVID-19 declaration under Section 564(b)(1) of the Act, 21 U.S.C. section 360-bbb-3(b)(1), unless the authorization is terminated or revoked sooner.  Performed at [facility], [place] [address], [place], [place] [address]   MRSA PCR Screening     Status: None   Collection Time: 04/24/20  5:00 PM   Specimen: Nasopharyngeal  Result Value Ref Range Status   MRSA by PCR NEGATIVE NEGATIVE Final    Comment:        The GeneXpert MRSA Assay (FDA approved for NASAL specimens only), is one component of a comprehensive MRSA colonization surveillance program. It is not intended to diagnose MRSA infection nor to guide or monitor treatment for MRSA infections. Performed at [facility], [place] [address], [place],  [address]          Radiology Studies: [facility] Chest Port 1 View  Result Date: 04/24/2020 CLINICAL DATA:  Respiratory failure. EXAM: PORTABLE CHEST 1 VIEW COMPARISON:  August 03, 2010. FINDINGS: The heart size and mediastinal contours are within normal limits. No pneumothorax or pleural effusion is noted. Multiple patchy airspace opacities are noted bilaterally consistent with multifocal pneumonia. The visualized skeletal structures are unremarkable. IMPRESSION: Bilateral multifocal pneumonia. Electronically Signed   By: Marijo Conception M.D.   On: 04/24/2020 14:47        Scheduled Meds: . albuterol  2 puff Inhalation Q6H  . baricitinib  4 mg Oral Daily  . Chlorhexidine Gluconate Cloth  6 each  Topical Daily  . enoxaparin (LOVENOX) injection  55 mg Subcutaneous Q24H  . insulin aspart  0-20 Units Subcutaneous TID WC  . insulin  aspart  0-5 Units Subcutaneous QHS  . insulin aspart  6 Units Subcutaneous TID WC  . mouth rinse  15 mL Mouth Rinse BID  . methylPREDNISolone (SOLU-MEDROL) injection  110 mg Intravenous Q12H   Continuous Infusions: . remdesivir 100 mg in NS 100 mL       LOS: 1 day    Time spent:40 min    Monika Chestang, Geraldo Docker, MD [facility] Pager [phone]  If 7PM-7AM, please contact night-coverage [email or web address] Password [facility] 04/25/2020, 7:28 AM

## 2020-04-25 NOTE — Progress Notes (Signed)
Initial Nutrition Assessment  RD working remotely.  DOCUMENTATION CODES:   Obesity unspecified  INTERVENTION:  - will order [employer] 1.4 po BID, each supplement provides 455 kcal and 20 grams protein. - will order 30 mL Prosource Plus BID, each supplement provides 100 kcal and 15 grams of protein. - will order 1 tablet multivitamin with minerals. - will complete NFPE when feasible.  NUTRITION DIAGNOSIS:   Increased nutrient needs related to acute illness, catabolic illness (FFMBW-46 infection) as evidenced by estimated needs  GOAL:   Patient will meet greater than or equal to 90% of their needs  MONITOR:   PO intake, Supplement acceptance, Labs, Weight trends  REASON FOR ASSESSMENT:   Malnutrition Screening Tool  ASSESSMENT:   64 y.o. male with medical history of asthma, obesity, sleep apnea, and type 2 DM. He was admitted with severe shortness of breath, cough, and fevers of 102-104 at home. His symptoms began on 9/13. His wife and mother-in-law also presented at the same time as him for similar symptoms. He is not vaccinated against COVID-19. He was found to be COVID positive in the ED.  No intakes documented since admission. Health Touch review indicates patient skipped breakfast this AM. Weight yesterday was 239 lb and weight appears to have been stable since weights recorded in 2017 and 2016.   Of note: - COVID-19 PNA with symptoms beginning on 9/13 and dx on 9/20 - hyperglycemia with hx of type 2 DM and currently ordered steroids   Labs reviewed; CBG: 290 mg/dl, BUN: 25 mg/dl, Ca: 8.2 mg/dl, Mg: 2.8 mg/dl. Medications reviewed; 500 mg ascorbic acid/day, sliding scale novolog, 6 units novolog TID, 110 mg solu-medrol BID, 200 mg IV remdesivir x1 dose 9/20, 100mg  IV remdesivir/day x4 days (9/21-9/24), 220 mg zinc sulfate/day.     NUTRITION - FOCUSED PHYSICAL EXAM:  unable to complete at this time.   Diet Order:   Diet Order            Diet Carb Modified Fluid  consistency: Thin; Room service appropriate? Yes  Diet effective now                 EDUCATION NEEDS:   No education needs have been identified at this time  Skin:  Skin Assessment: Reviewed RN Assessment  Last BM:  PTA/unknown  Height:   Ht Readings from Last 1 Encounters:  04/24/20 5\' 9"  (1.753 m)    Weight:   Wt Readings from Last 1 Encounters:  04/24/20 108.4 kg    Ideal Body Weight:     BMI:  Body mass index is 35.29 kg/m.  Estimated Nutritional Needs:   Kcal:  6599-3570 kcal (20-22 kcal/kg adjBW)  Protein:  105-115 grams  Fluid:  >/= 2 L/day     Jarome Matin, MS, RD, LDN, CNSC Inpatient Clinical Dietitian RD pager # available in AMION  After hours/weekend pager # available in [facility]

## 2020-04-26 LAB — FERRITIN: Ferritin: 1613 ng/mL — ABNORMAL HIGH (ref 24–336)

## 2020-04-26 LAB — COMPREHENSIVE METABOLIC PANEL
ALT: 48 U/L — ABNORMAL HIGH (ref 0–44)
AST: 55 U/L — ABNORMAL HIGH (ref 15–41)
Albumin: 2.8 g/dL — ABNORMAL LOW (ref 3.5–5.0)
Alkaline Phosphatase: 47 U/L (ref 38–126)
Anion gap: 9 (ref 5–15)
BUN: 28 mg/dL — ABNORMAL HIGH (ref 8–23)
CO2: 24 mmol/L (ref 22–32)
Calcium: 8.2 mg/dL — ABNORMAL LOW (ref 8.9–10.3)
Chloride: 99 mmol/L (ref 98–111)
Creatinine, Ser: 1.06 mg/dL (ref 0.61–1.24)
GFR calc Af Amer: >60 mL/min (ref >60)
GFR calc non Af Amer: >60 mL/min (ref >60)
Glucose, Bld: 384 mg/dL — ABNORMAL HIGH (ref 70–99)
Potassium: 3.7 mmol/L (ref 3.5–5.1)
Sodium: 132 mmol/L — ABNORMAL LOW (ref 135–145)
Total Bilirubin: 0.5 mg/dL (ref 0.3–1.2)
Total Protein: 6.5 g/dL (ref 6.5–8.1)

## 2020-04-26 LAB — GLUCOSE, CAPILLARY
Glucose-Capillary: 215 mg/dL — ABNORMAL HIGH (ref 70–99)
Glucose-Capillary: 230 mg/dL — ABNORMAL HIGH (ref 70–99)
Glucose-Capillary: 236 mg/dL — ABNORMAL HIGH (ref 70–99)
Glucose-Capillary: 265 mg/dL — ABNORMAL HIGH (ref 70–99)
Glucose-Capillary: 278 mg/dL — ABNORMAL HIGH (ref 70–99)
Glucose-Capillary: 343 mg/dL — ABNORMAL HIGH (ref 70–99)
Glucose-Capillary: 345 mg/dL — ABNORMAL HIGH (ref 70–99)

## 2020-04-26 LAB — D-DIMER, QUANTITATIVE: D-Dimer, Quant: 0.92 ug/mL-FEU — ABNORMAL HIGH (ref 0.00–0.50)

## 2020-04-26 LAB — CBC WITH DIFFERENTIAL/PLATELET
Abs Immature Granulocytes: 0.12 10*3/uL — ABNORMAL HIGH (ref 0.00–0.07)
Basophils Absolute: 0 10*3/uL (ref 0.0–0.1)
Basophils Relative: 0 %
Eosinophils Absolute: 0 10*3/uL (ref 0.0–0.5)
Eosinophils Relative: 0 %
HCT: 38 % — ABNORMAL LOW (ref 39.0–52.0)
Hemoglobin: 12.6 g/dL — ABNORMAL LOW (ref 13.0–17.0)
Immature Granulocytes: 1 %
Lymphocytes Relative: 6 %
Lymphs Abs: 0.7 10*3/uL (ref 0.7–4.0)
MCH: 30.6 pg (ref 26.0–34.0)
MCHC: 33.2 g/dL (ref 30.0–36.0)
MCV: 92.2 fL (ref 80.0–100.0)
Monocytes Absolute: 0.9 10*3/uL (ref 0.1–1.0)
Monocytes Relative: 7 %
Neutro Abs: 10.1 10*3/uL — ABNORMAL HIGH (ref 1.7–7.7)
Neutrophils Relative %: 86 %
Platelets: 370 10*3/uL (ref 150–400)
RBC: 4.12 MIL/uL — ABNORMAL LOW (ref 4.22–5.81)
RDW: 13 % (ref 11.5–15.5)
WBC: 11.8 10*3/uL — ABNORMAL HIGH (ref 4.0–10.5)
nRBC: 0 % (ref 0.0–0.2)

## 2020-04-26 LAB — C-REACTIVE PROTEIN: CRP: 12 mg/dL — ABNORMAL HIGH (ref <1.0)

## 2020-04-26 LAB — MAGNESIUM: Magnesium: 2.5 mg/dL — ABNORMAL HIGH (ref 1.7–2.4)

## 2020-04-26 LAB — LACTATE DEHYDROGENASE: LDH: 353 U/L — ABNORMAL HIGH (ref 98–192)

## 2020-04-26 LAB — PHOSPHORUS: Phosphorus: 2.6 mg/dL (ref 2.5–4.6)

## 2020-04-26 MED ORDER — INSULIN ASPART 100 UNIT/ML ~~LOC~~ SOLN
6.0000 [IU] | Freq: Three times a day (TID) | SUBCUTANEOUS | Status: DC
  Administered 2020-04-26 – 2020-05-09 (×28): 6 [IU] via SUBCUTANEOUS

## 2020-04-26 MED ORDER — LIP MEDEX EX OINT
TOPICAL_OINTMENT | CUTANEOUS | Status: AC
  Filled 2020-04-26: qty 7

## 2020-04-26 MED ORDER — INSULIN ASPART 100 UNIT/ML ~~LOC~~ SOLN
0.0000 [IU] | Freq: Every day | SUBCUTANEOUS | Status: DC
  Administered 2020-04-26: 3 [IU] via SUBCUTANEOUS
  Administered 2020-05-15 – 2020-05-20 (×3): 2 [IU] via SUBCUTANEOUS
  Administered 2020-05-23: 3 [IU] via SUBCUTANEOUS
  Administered 2020-05-26 – 2020-05-27 (×2): 2 [IU] via SUBCUTANEOUS

## 2020-04-26 MED ORDER — HYDROCOD POLST-CPM POLST ER 10-8 MG/5ML PO SUER
5.0000 mL | Freq: Every day | ORAL | Status: DC
  Administered 2020-04-26: 5 mL via ORAL
  Filled 2020-04-26: qty 5

## 2020-04-26 MED ORDER — BENZONATATE 100 MG PO CAPS
100.0000 mg | ORAL_CAPSULE | Freq: Two times a day (BID) | ORAL | Status: DC
  Administered 2020-04-26 – 2020-04-28 (×5): 100 mg via ORAL
  Filled 2020-04-26 (×5): qty 1

## 2020-04-26 MED ORDER — ATORVASTATIN CALCIUM 40 MG PO TABS
40.0000 mg | ORAL_TABLET | Freq: Every day | ORAL | Status: DC
  Administered 2020-04-26 – 2020-04-29 (×4): 40 mg via ORAL
  Filled 2020-04-26 (×4): qty 1

## 2020-04-26 MED ORDER — SERTRALINE HCL 100 MG PO TABS
100.0000 mg | ORAL_TABLET | Freq: Every day | ORAL | Status: DC
  Administered 2020-04-26 – 2020-05-30 (×35): 100 mg via ORAL
  Filled 2020-04-26 (×35): qty 1

## 2020-04-26 MED ORDER — INSULIN DETEMIR 100 UNIT/ML ~~LOC~~ SOLN
10.0000 [IU] | Freq: Two times a day (BID) | SUBCUTANEOUS | Status: DC
  Filled 2020-04-26: qty 0.1

## 2020-04-26 MED ORDER — LINAGLIPTIN 5 MG PO TABS
5.0000 mg | ORAL_TABLET | Freq: Every day | ORAL | Status: DC
  Administered 2020-04-26 – 2020-05-30 (×36): 5 mg via ORAL
  Filled 2020-04-26 (×35): qty 1

## 2020-04-26 MED ORDER — SALINE SPRAY 0.65 % NA SOLN
1.0000 | NASAL | Status: DC | PRN
  Administered 2020-04-26: 1 via NASAL
  Filled 2020-04-26: qty 44

## 2020-04-26 MED ORDER — GUAIFENESIN ER 600 MG PO TB12
600.0000 mg | ORAL_TABLET | Freq: Two times a day (BID) | ORAL | Status: DC
  Administered 2020-04-26 – 2020-05-21 (×51): 600 mg via ORAL
  Filled 2020-04-26 (×51): qty 1

## 2020-04-26 MED ORDER — METHOCARBAMOL 500 MG PO TABS: 500.0000 mg | ORAL_TABLET | Freq: Three times a day (TID) | ORAL | Status: DC | PRN

## 2020-04-26 MED ORDER — INSULIN ASPART 100 UNIT/ML ~~LOC~~ SOLN
0.0000 [IU] | Freq: Three times a day (TID) | SUBCUTANEOUS | Status: DC
  Administered 2020-04-26 – 2020-04-27 (×3): 7 [IU] via SUBCUTANEOUS
  Administered 2020-04-27: 11 [IU] via SUBCUTANEOUS
  Administered 2020-04-27: 7 [IU] via SUBCUTANEOUS
  Administered 2020-04-28: 4 [IU] via SUBCUTANEOUS
  Administered 2020-04-28: 7 [IU] via SUBCUTANEOUS
  Administered 2020-04-28: 4 [IU] via SUBCUTANEOUS
  Administered 2020-04-29: 3 [IU] via SUBCUTANEOUS
  Administered 2020-04-29 (×2): 4 [IU] via SUBCUTANEOUS
  Administered 2020-04-30: 11 [IU] via SUBCUTANEOUS
  Administered 2020-04-30 – 2020-05-01 (×3): 4 [IU] via SUBCUTANEOUS
  Administered 2020-05-01 (×2): 7 [IU] via SUBCUTANEOUS
  Administered 2020-05-02: 11 [IU] via SUBCUTANEOUS
  Administered 2020-05-02 – 2020-05-03 (×3): 7 [IU] via SUBCUTANEOUS
  Administered 2020-05-03: 3 [IU] via SUBCUTANEOUS
  Administered 2020-05-03: 4 [IU] via SUBCUTANEOUS
  Administered 2020-05-04: 11 [IU] via SUBCUTANEOUS
  Administered 2020-05-04: 4 [IU] via SUBCUTANEOUS
  Administered 2020-05-04: 7 [IU] via SUBCUTANEOUS
  Administered 2020-05-05 (×2): 4 [IU] via SUBCUTANEOUS
  Administered 2020-05-05 – 2020-05-06 (×2): 7 [IU] via SUBCUTANEOUS
  Administered 2020-05-06 – 2020-05-07 (×3): 4 [IU] via SUBCUTANEOUS
  Administered 2020-05-07: 11 [IU] via SUBCUTANEOUS
  Administered 2020-05-08: 3 [IU] via SUBCUTANEOUS
  Administered 2020-05-08: 11 [IU] via SUBCUTANEOUS
  Administered 2020-05-09 (×2): 3 [IU] via SUBCUTANEOUS
  Administered 2020-05-10 – 2020-05-11 (×3): 4 [IU] via SUBCUTANEOUS
  Administered 2020-05-11 (×2): 3 [IU] via SUBCUTANEOUS
  Administered 2020-05-12 (×3): 4 [IU] via SUBCUTANEOUS
  Administered 2020-05-13: 3 [IU] via SUBCUTANEOUS
  Administered 2020-05-13 – 2020-05-14 (×2): 4 [IU] via SUBCUTANEOUS
  Administered 2020-05-14 (×2): 3 [IU] via SUBCUTANEOUS
  Administered 2020-05-15: 7 [IU] via SUBCUTANEOUS
  Administered 2020-05-15: 3 [IU] via SUBCUTANEOUS
  Administered 2020-05-16: 4 [IU] via SUBCUTANEOUS
  Administered 2020-05-16: 11 [IU] via SUBCUTANEOUS
  Administered 2020-05-17 – 2020-05-18 (×3): 3 [IU] via SUBCUTANEOUS
  Administered 2020-05-19: 4 [IU] via SUBCUTANEOUS
  Administered 2020-05-19: 3 [IU] via SUBCUTANEOUS
  Administered 2020-05-20 – 2020-05-21 (×2): 4 [IU] via SUBCUTANEOUS
  Administered 2020-05-21 (×2): 3 [IU] via SUBCUTANEOUS
  Administered 2020-05-22: 4 [IU] via SUBCUTANEOUS
  Administered 2020-05-22 – 2020-05-23 (×2): 3 [IU] via SUBCUTANEOUS
  Administered 2020-05-23 – 2020-05-24 (×2): 7 [IU] via SUBCUTANEOUS
  Administered 2020-05-24: 3 [IU] via SUBCUTANEOUS
  Administered 2020-05-25 – 2020-05-26 (×2): 4 [IU] via SUBCUTANEOUS
  Administered 2020-05-27 – 2020-05-28 (×4): 7 [IU] via SUBCUTANEOUS
  Administered 2020-05-28 – 2020-05-29 (×2): 3 [IU] via SUBCUTANEOUS
  Administered 2020-05-29: 7 [IU] via SUBCUTANEOUS
  Administered 2020-05-29: 15 [IU] via SUBCUTANEOUS
  Administered 2020-05-30: 3 [IU] via SUBCUTANEOUS
  Administered 2020-05-30: 7 [IU] via SUBCUTANEOUS

## 2020-04-26 MED ORDER — INSULIN DETEMIR 100 UNIT/ML ~~LOC~~ SOLN
0.1500 [IU]/kg | Freq: Two times a day (BID) | SUBCUTANEOUS | Status: DC
  Administered 2020-04-26 – 2020-04-27 (×4): 16 [IU] via SUBCUTANEOUS
  Filled 2020-04-26 (×6): qty 0.16

## 2020-04-26 MED ORDER — TRAMADOL HCL 50 MG PO TABS: 50.0000 mg | ORAL_TABLET | Freq: Three times a day (TID) | ORAL | Status: DC | PRN

## 2020-04-26 MED ORDER — ALBUTEROL SULFATE HFA 108 (90 BASE) MCG/ACT IN AERS
2.0000 | INHALATION_SPRAY | RESPIRATORY_TRACT | Status: DC | PRN
  Administered 2020-04-27 – 2020-05-04 (×3): 2 via RESPIRATORY_TRACT

## 2020-04-26 MED ORDER — MONTELUKAST SODIUM 10 MG PO TABS
10.0000 mg | ORAL_TABLET | Freq: Every day | ORAL | Status: DC
  Administered 2020-04-26 – 2020-05-29 (×34): 10 mg via ORAL
  Filled 2020-04-26 (×34): qty 1

## 2020-04-26 MED ORDER — IPRATROPIUM-ALBUTEROL 20-100 MCG/ACT IN AERS
1.0000 | INHALATION_SPRAY | Freq: Three times a day (TID) | RESPIRATORY_TRACT | Status: DC
  Administered 2020-04-26 – 2020-04-30 (×12): 1 via RESPIRATORY_TRACT
  Filled 2020-04-26: qty 4

## 2020-04-26 NOTE — TOC Initial Note (Signed)
Transition of Care [facility]) - Initial/Assessment Note    Patient Details  Name: Clayton [name] MRN: [id number] Date of Birth: [birth year]  [facility] CM/SW Contact:    Leeroy Cha, RN Phone Number: 04/26/2020, 9:50 AM  Clinical Narrative:                 covid + unvaccinated male, lives at home with mother and wife, iv solu medrol, iv remdesivir through [id number]. Plan to return to home Following for progression.  Expected Discharge Plan: Home/Self Care Barriers to Discharge: Continued Medical Work up   Patient Goals and CMS Choice Patient states their goals for this hospitalization and ongoing recovery are:: to go home CMS Medicare.gov Compare Post Acute Care list provided to:: Patient    Expected Discharge Plan and Services Expected Discharge Plan: Home/Self Care   Discharge Planning Services: CM Consult   Living arrangements for the past 2 months: Single Family Home                                      Prior Living Arrangements/Services Living arrangements for the past 2 months: Single Family Home Lives with:: Spouse Patient language and need for interpreter reviewed:: Yes Do you feel safe going back to the place where you live?: Yes      Need for Family Participation in Patient Care: Yes (Comment) Care giver support system in place?: Yes (comment)   Criminal Activity/Legal Involvement Pertinent to Current Situation/Hospitalization: No - Comment as needed  Activities of Daily Living Home Assistive Devices/Equipment: CBG Meter, Eyeglasses ADL Screening (condition at time of admission) Patient's cognitive ability adequate to safely complete daily activities?: Yes Is the patient deaf or have difficulty hearing?: No Does the patient have difficulty seeing, even when wearing glasses/contacts?: No Does the patient have difficulty concentrating, remembering, or making decisions?: No Patient able to express need for assistance with ADLs?: Yes Does  the patient have difficulty dressing or bathing?: Yes Independently performs ADLs?: No Communication: Independent Dressing (OT): Needs assistance Is this a change from baseline?: Change from baseline, expected to last >3 days Grooming: Independent Feeding: Independent Bathing: Needs assistance Is this a change from baseline?: Change from baseline, expected to last >3 days Toileting: Needs assistance Is this a change from baseline?: Change from baseline, expected to last >3days In/Out Bed: Needs assistance Is this a change from baseline?: Change from baseline, expected to last >3 days Walks in Home: Independent Does the patient have difficulty walking or climbing stairs?: Yes Weakness of Legs: Both Weakness of Arms/Hands: Both  Permission Sought/Granted                  Emotional Assessment Appearance:: Appears stated age Attitude/Demeanor/Rapport: Engaged Affect (typically observed): Calm Orientation: : Oriented to Self, Oriented to Place, Oriented to  Time, Oriented to Situation Alcohol / Substance Use: Not Applicable Psych Involvement: No (comment)  Admission diagnosis:  Multifocal pneumonia [J18.9] Acute hypoxemic respiratory failure due to COVID-19 ([place]) [U07.1, J96.01] Pneumonia due to COVID-19 virus [U07.1, J12.82] Patient Active Problem List   Diagnosis Date Noted  . Uncontrolled type 2 diabetes mellitus with hyperglycemia ([place]) 04/25/2020  . Pneumonia due to COVID-19 virus 04/24/2020  . Hypokalemia 04/24/2020  . Hyperglycemia 04/24/2020  . Acute respiratory failure with hypoxia ([place])   . Multifocal pneumonia   . Suspected COVID-19 virus infection   . S/P hernia repair 05/23/2015  . Chronic cough  06/25/2011  . LIPOMA, left shoulder. 04/04/2008  . ALLERGIC RHINITIS 12/21/2007  . GERD 12/21/2007  . NEPHROLITHIASIS, HX OF 12/21/2007   PCP:  Kristen Loader, FNP Pharmacy:   [place], [place] [address] Phone: [phone] Fax: [phone]     Social Determinants of Health (SDOH) Interventions    Readmission Risk Interventions No flowsheet data found.

## 2020-04-26 NOTE — Progress Notes (Signed)
[facility] Progress Note  Patient: Clayton [name]    DZH:[id number]  DOA: 04/24/2020     Date of Service: the patient was seen and examined on 04/26/2020  Brief hospital course: Past medical history of depression, asthma, OSA not on CPAP, type II DM, obesity.  Presents with complaints of cough and shortness of breath.  Found to have COVID-19 pneumonia. Currently plan is continue current care with heated high flow.  Assessment and Plan: 1.  Acute hypoxic respiratory failure, POA. Sepsis secondary to COVID-19 pneumonia POA. Meet SIRS criteria on admission with fever, tachypnea and tachycardia. Currently on baricitinib, IV Solu-Medrol, remdesivir as well as vitamins. Also on heated high flow along with nonrebreather. Continue the stepdown unit. Monitor. Supportive care for cough suppression.  2.  OSA. Not using any CPAP at home. Continue to monitor for now.  3.  Type 2 diabetes mellitus with hyperglycemia with hyperlipidemia Hemoglobin A1c 8.4. Currently on sliding scale insulin.  Blood sugar significant elevated therefore will adjusted. Continue Lipitor.  4.  Essential hypertension Continue losartan.  Hold HCTZ.  5.  Hypokalemia, hyponatremia Monitor for now  6.  Depression Continue home regimen.  7.  Obesity At risk for poor outcome in the setting of COVID-19 pneumonia.  Monitor. Body mass index is 35.29 kg/m.  Nutrition Problem: Increased nutrient needs Etiology: acute illness, catabolic illness (MHDQQ-22 infection) Interventions: Interventions: MVI, Other (Comment) (Prosource Plus; [employer] 1.4 po)      Diet: Cardiac and carb 25 diet DVT Prophylaxis:   SCDs Start: 04/24/20 1628    Advance goals of care discussion: Full code  Family Communication: no family was present at bedside, at the time of interview.   Disposition:  Status is: Inpatient  Remains inpatient appropriate because:Hemodynamically unstable   Dispo: The patient is from: Home               Anticipated d/c is to: Home              Anticipated d/c date is: > 3 days              Patient currently is not medically stable to d/c.        Subjective: No nausea no vomiting.  Continues to have cough continues to have shortness of breath.  Occasional cough causing chest pain.  No fever no chills.  Physical Exam:  General: Appear in marked distress, no Rash; Oral Mucosa Clear, moist. no Abnormal Neck Mass Or lumps, Conjunctiva normal  Cardiovascular: S1 and S2 Present, no Murmur, Respiratory: increased respiratory effort, Bilateral Air entry present and bilateral  Crackles, no wheezes Abdomen: Bowel Sound present, Soft and no tenderness Extremities: no Pedal edema Neurology: alert and oriented to time, place, and person affect appropriate. no new focal deficit Gait not checked due to patient safety concerns  Vitals:   04/26/20 1700 04/26/20 1800 04/26/20 1820 04/26/20 1900  BP:   (!) 133/54 (!) 133/52  Pulse: 78 88 75 88  Resp: 17 (!) 21 (!) 27 (!) 21  Temp:      TempSrc:      SpO2: 93% (!) 83% (!) 86% 91%  Weight:      Height:        Intake/Output Summary (Last 24 hours) at 04/26/2020 1927 Last data filed at 04/26/2020 1800 Gross per 24 hour  Intake 1299.12 ml  Output 1850 ml  Net -550.88 ml   Filed Weights   04/24/20 1400 04/24/20 1905  Weight: 111.1 kg 108.4 kg  Data Reviewed: I have personally reviewed and interpreted daily labs, tele strips, imagings as discussed above. I reviewed all nursing notes, pharmacy notes, vitals, pertinent old records I have discussed plan of care as described above with RN and patient/family.  CBC: Recent Labs  Lab 04/24/20 1405 04/25/20 0253 04/26/20 0303  WBC 8.2 4.3 11.8*  NEUTROABS 6.6 3.6 10.1*  HGB 12.9* 13.0 12.6*  HCT 39.0 39.4 38.0*  MCV 90.7 91.8 92.2  PLT 279 324 453   Basic Metabolic Panel: Recent Labs  Lab 04/24/20 1405 04/25/20 0253 04/26/20 0303  NA 130* 135 132*  K 3.3* 4.0 3.7  CL 95*  98 99  CO2 25 26 24   GLUCOSE 245* 332* 384*  BUN 22 25* 28*  CREATININE 0.99 1.08 1.06  CALCIUM 7.7* 8.2* 8.2*  MG  --  2.8* 2.5*  PHOS  --  3.8 2.6    Studies: No results found.  Scheduled Meds:  (feeding supplement) PROSource Plus  30 mL Oral BID BM   vitamin C  500 mg Oral Daily   atorvastatin  40 mg Oral Daily   baricitinib  4 mg Oral Daily   benzonatate  100 mg Oral BID   Chlorhexidine Gluconate Cloth  6 each Topical Daily   chlorpheniramine-HYDROcodone  5 mL Oral QHS   enoxaparin (LOVENOX) injection  55 mg Subcutaneous Q24H   feeding supplement ([employer] STANDARD 1.4)  325 mL Oral BID BM   guaiFENesin  600 mg Oral BID   insulin aspart  0-20 Units Subcutaneous TID WC   insulin aspart  0-5 Units Subcutaneous QHS   insulin aspart  6 Units Subcutaneous TID WC   insulin detemir  0.15 Units/kg Subcutaneous BID   Ipratropium-Albuterol  1 puff Inhalation TID   linagliptin  5 mg Oral Daily   losartan  50 mg Oral Daily   mouth rinse  15 mL Mouth Rinse BID   methylPREDNISolone (SOLU-MEDROL) injection  110 mg Intravenous Q12H   montelukast  10 mg Oral QHS   multivitamin with minerals  1 tablet Oral Daily   sertraline  100 mg Oral Daily   zinc sulfate  220 mg Oral Daily   Continuous Infusions:  remdesivir 100 mg in NS 100 mL Stopped (04/26/20 1109)   PRN Meds: albuterol, chlorpheniramine-HYDROcodone, guaiFENesin-dextromethorphan, methocarbamol, sodium chloride, traMADol  Time spent: 35 minutes  Author: Berle Mull, MD [facility] 04/26/2020 7:27 PM  To reach On-call, see care teams to locate the attending and reach out via [email or web address]. Between 7PM-7AM, please contact night-coverage If you still have difficulty reaching the attending provider, please page the [facility] (Director on Call) for [facility] on amion for assistance.

## 2020-04-27 LAB — COMPREHENSIVE METABOLIC PANEL
ALT: 39 U/L (ref 0–44)
AST: 36 U/L (ref 15–41)
Albumin: 2.8 g/dL — ABNORMAL LOW (ref 3.5–5.0)
Alkaline Phosphatase: 48 U/L (ref 38–126)
Anion gap: 13 (ref 5–15)
BUN: 29 mg/dL — ABNORMAL HIGH (ref 8–23)
CO2: 25 mmol/L (ref 22–32)
Calcium: 8.2 mg/dL — ABNORMAL LOW (ref 8.9–10.3)
Chloride: 99 mmol/L (ref 98–111)
Creatinine, Ser: 1 mg/dL (ref 0.61–1.24)
GFR calc Af Amer: >60 mL/min (ref >60)
GFR calc non Af Amer: >60 mL/min (ref >60)
Glucose, Bld: 252 mg/dL — ABNORMAL HIGH (ref 70–99)
Potassium: 3.9 mmol/L (ref 3.5–5.1)
Sodium: 137 mmol/L (ref 135–145)
Total Bilirubin: 1 mg/dL (ref 0.3–1.2)
Total Protein: 6.3 g/dL — ABNORMAL LOW (ref 6.5–8.1)

## 2020-04-27 LAB — CBC WITH DIFFERENTIAL/PLATELET
Abs Immature Granulocytes: 0.29 10*3/uL — ABNORMAL HIGH (ref 0.00–0.07)
Basophils Absolute: 0 10*3/uL (ref 0.0–0.1)
Basophils Relative: 0 %
Eosinophils Absolute: 0 10*3/uL (ref 0.0–0.5)
Eosinophils Relative: 0 %
HCT: 39.5 % (ref 39.0–52.0)
Hemoglobin: 12.7 g/dL — ABNORMAL LOW (ref 13.0–17.0)
Immature Granulocytes: 2 %
Lymphocytes Relative: 5 %
Lymphs Abs: 0.7 10*3/uL (ref 0.7–4.0)
MCH: 30.1 pg (ref 26.0–34.0)
MCHC: 32.2 g/dL (ref 30.0–36.0)
MCV: 93.6 fL (ref 80.0–100.0)
Monocytes Absolute: 1.1 10*3/uL — ABNORMAL HIGH (ref 0.1–1.0)
Monocytes Relative: 7 %
Neutro Abs: 13.5 10*3/uL — ABNORMAL HIGH (ref 1.7–7.7)
Neutrophils Relative %: 86 %
Platelets: 283 10*3/uL (ref 150–400)
RBC: 4.22 MIL/uL (ref 4.22–5.81)
RDW: 13 % (ref 11.5–15.5)
WBC: 15.7 10*3/uL — ABNORMAL HIGH (ref 4.0–10.5)
nRBC: 0 % (ref 0.0–0.2)

## 2020-04-27 LAB — GLUCOSE, CAPILLARY
Glucose-Capillary: 231 mg/dL — ABNORMAL HIGH (ref 70–99)
Glucose-Capillary: 262 mg/dL — ABNORMAL HIGH (ref 70–99)
Glucose-Capillary: 213 mg/dL — ABNORMAL HIGH (ref 70–99)
Glucose-Capillary: 173 mg/dL — ABNORMAL HIGH (ref 70–99)

## 2020-04-27 LAB — D-DIMER, QUANTITATIVE: D-Dimer, Quant: 2.57 ug/mL-FEU — ABNORMAL HIGH (ref 0.00–0.50)

## 2020-04-27 LAB — MAGNESIUM: Magnesium: 2.5 mg/dL — ABNORMAL HIGH (ref 1.7–2.4)

## 2020-04-27 LAB — C-REACTIVE PROTEIN: CRP: 6.6 mg/dL — ABNORMAL HIGH (ref <1.0)

## 2020-04-27 LAB — FERRITIN: Ferritin: 1329 ng/mL — ABNORMAL HIGH (ref 24–336)

## 2020-04-27 MED ORDER — HYDROCOD POLST-CPM POLST ER 10-8 MG/5ML PO SUER
5.0000 mL | Freq: Two times a day (BID) | ORAL | Status: DC
  Administered 2020-04-27 – 2020-04-30 (×7): 5 mL via ORAL
  Filled 2020-04-27 (×8): qty 5

## 2020-04-27 MED ORDER — METOPROLOL TARTRATE 5 MG/5ML IV SOLN: 5.0000 mg | INTRAVENOUS | Status: DC | PRN

## 2020-04-27 NOTE — Progress Notes (Signed)
Changed water bottle on heated high flow nasal cannula system- uneventful.

## 2020-04-27 NOTE — Progress Notes (Signed)
Inpatient Diabetes Program Recommendations  AACE/ADA: New Consensus Statement on Inpatient Glycemic Control (2015)  Target Ranges:  Prepandial:   less than 140 mg/dL      Peak postprandial:   less than 180 mg/dL (1-2 hours)      Critically ill patients:  140 - 180 mg/dL   Lab Results  Component Value Date   GLUCAP 231 (H) 04/27/2020   HGBA1C 8.4 (H) 04/25/2020    Review of Glycemic Control  Diabetes history: DM2  Current orders for Inpatient glycemic control: Levemir 16 units bid, Novolog 0-20 units tidwc and hs + 6 units tidwc, tradjenta 5 mg QD  On Solumedrol 110 mg Q12H CBGs 252, 231 mg/dL this am.  Inpatient Diabetes Program Recommendations:     Increase Levemir to 18 units bid Increase Novolog to 10 units tidwc.  Attempted to speak with pt yesterday about his diabetes and HgbA1C of 8.4%. RN stated pt was too tired and sleepy and it would be better to speak with him this am. Have secure texted RN to see if this is a good time.   Will continue to follow glucose trends.  Thank you. Lorenda Peck, RD, LDN, CDE Inpatient Diabetes Coordinator [phone]

## 2020-04-27 NOTE — Progress Notes (Addendum)
[facility] Progress Note  Patient: Clayton [name]    JSH:[id number]  DOA: 04/24/2020     Date of Service: the patient was seen and examined on 04/27/2020  Brief hospital course: Past medical history of depression, asthma, OSA not on CPAP, type II DM, obesity.  Presents with complaints of cough and shortness of breath.  Found to have COVID-19 pneumonia. Currently plan is continue current care with heated high flow.  Assessment and Plan: 1.  Acute hypoxic respiratory failure, POA. Sepsis secondary to COVID-19 pneumonia POA. Meet SIRS criteria on admission with fever, tachypnea and tachycardia. Currently on baricitinib, IV Solu-Medrol, remdesivir as well as vitamins. Also on heated high flow along with nonrebreather. Continue the stepdown unit. Monitor. Supportive care for cough suppression.  2.  OSA. Not using any CPAP at home. Continue to monitor for now.  3.  Type 2 diabetes mellitus with hyperglycemia with hyperlipidemia Hemoglobin A1c 8.4. Currently on sliding scale insulin.  Blood sugar significant elevated therefore will adjusted. Continue Lipitor.  4.  Essential hypertension Continue losartan.  Hold HCTZ.  5.  Hypokalemia, hyponatremia Monitor for now  6.  Depression Continue home regimen.  7.  Obesity At risk for poor outcome in the setting of COVID-19 pneumonia.  Monitor. Body mass index is 35.29 kg/m.  Nutrition Problem: Increased nutrient needs Etiology: acute illness, catabolic illness (IFOYD-74 infection) Interventions: Interventions: MVI, Other (Comment) (Prosource Plus; [employer] 1.4 po)      Diet: Cardiac and carb modified diet DVT Prophylaxis:   SCDs Start: 04/24/20 1628    Advance goals of care discussion: Full code  Family Communication: no family was present at bedside, at the time of interview.   Disposition:  Status is: Inpatient  Remains inpatient appropriate because:Hemodynamically unstable   Dispo: The patient is from:  Home              Anticipated d/c is to: Home              Anticipated d/c date is: > 3 days              Patient currently is not medically stable to d/c.  Subjective: Cough is improving.  No nausea no vomiting.  No chest pain.  Physical Exam:  General: Appear in moderate distress, no Rash; Oral Mucosa Clear, moist. no Abnormal Neck Mass Or lumps, Conjunctiva normal  Cardiovascular: S1 and S2 Present, no Murmur, Respiratory: increased respiratory effort, Bilateral Air entry present and bilateral  Crackles, no wheezes Abdomen: Bowel Sound present, Soft and no tenderness Extremities: no Pedal edema Neurology: alert and oriented to time, place, and person affect appropriate. no new focal deficit Gait not checked due to patient safety concerns   Vitals:   04/27/20 1405 04/27/20 1500 04/27/20 1529 04/27/20 1800  BP:   (!) 188/76   Pulse:  77 79 96  Resp:  (!) 37 (!) 22 (!) 23  Temp:      TempSrc:      SpO2: 93% (!) 88% (!) 88% (!) 89%  Weight:      Height:        Intake/Output Summary (Last 24 hours) at 04/27/2020 1823 Last data filed at 04/27/2020 1700 Gross per 24 hour  Intake 1416.39 ml  Output 1800 ml  Net -383.61 ml   Filed Weights   04/24/20 1400 04/24/20 1905  Weight: 111.1 kg 108.4 kg    Data Reviewed: I have personally reviewed and interpreted daily labs, tele strips, imagings as discussed above. I  reviewed all nursing notes, pharmacy notes, vitals, pertinent old records I have discussed plan of care as described above with RN and patient/family.  CBC: Recent Labs  Lab 04/24/20 1405 04/25/20 0253 04/26/20 0303 04/27/20 0251  WBC 8.2 4.3 11.8* 15.7*  NEUTROABS 6.6 3.6 10.1* 13.5*  HGB 12.9* 13.0 12.6* 12.7*  HCT 39.0 39.4 38.0* 39.5  MCV 90.7 91.8 92.2 93.6  PLT 279 324 370 211   Basic Metabolic Panel: Recent Labs  Lab 04/24/20 1405 04/25/20 0253 04/26/20 0303 04/27/20 0251  NA 130* 135 132* 137  K 3.3* 4.0 3.7 3.9  CL 95* 98 99 99  CO2 25 26  24 25   GLUCOSE 245* 332* 384* 252*  BUN 22 25* 28* 29*  CREATININE 0.99 1.08 1.06 1.00  CALCIUM 7.7* 8.2* 8.2* 8.2*  MG  --  2.8* 2.5* 2.5*  PHOS  --  3.8 2.6  --     Studies: No results found.  Scheduled Meds: . (feeding supplement) PROSource Plus  30 mL Oral BID BM  . vitamin C  500 mg Oral Daily  . atorvastatin  40 mg Oral Daily  . baricitinib  4 mg Oral Daily  . benzonatate  100 mg Oral BID  . Chlorhexidine Gluconate Cloth  6 each Topical Daily  . chlorpheniramine-HYDROcodone  5 mL Oral QHS  . enoxaparin (LOVENOX) injection  55 mg Subcutaneous Q24H  . feeding supplement ([employer] STANDARD 1.4)  325 mL Oral BID BM  . guaiFENesin  600 mg Oral BID  . insulin aspart  0-20 Units Subcutaneous TID WC  . insulin aspart  0-5 Units Subcutaneous QHS  . insulin aspart  6 Units Subcutaneous TID WC  . insulin detemir  0.15 Units/kg Subcutaneous BID  . Ipratropium-Albuterol  1 puff Inhalation TID  . linagliptin  5 mg Oral Daily  . losartan  50 mg Oral Daily  . mouth rinse  15 mL Mouth Rinse BID  . methylPREDNISolone (SOLU-MEDROL) injection  110 mg Intravenous Q12H  . montelukast  10 mg Oral QHS  . multivitamin with minerals  1 tablet Oral Daily  . sertraline  100 mg Oral Daily  . zinc sulfate  220 mg Oral Daily   Continuous Infusions: . remdesivir 100 mg in NS 100 mL Stopped (04/27/20 0950)   PRN Meds: albuterol, chlorpheniramine-HYDROcodone, guaiFENesin-dextromethorphan, methocarbamol, sodium chloride, traMADol  Time spent: 35 minutes  Author: Berle Mull, MD [facility] 04/27/2020 6:23 PM  To reach On-call, see care teams to locate the attending and reach out via [email or web address]. Between 7PM-7AM, please contact night-coverage If you still have difficulty reaching the attending provider, please page the [facility] (Director on Call) for [facility] on amion for assistance.

## 2020-04-27 NOTE — Progress Notes (Signed)
Nutrition Follow-up  DOCUMENTATION CODES:   Obesity unspecified  INTERVENTION:   -[employer] 1.4 po BID, each supplement provides 455 kcal and 20 grams protein. -30 mL Prosource Plus BID, each supplement provides 100 kcal and 15 grams of protein. -1 tablet multivitamin with minerals. -Placed Carbohydrate Counting handout in discharge instructions  NUTRITION DIAGNOSIS:   Increased nutrient needs related to acute illness, catabolic illness (UDJSH-70 infection) as evidenced by estimated needs.  Ongoing.  GOAL:   Patient will meet greater than or equal to 90% of their needs  Progressing.  MONITOR:   PO intake, Supplement acceptance, Labs, Weight trends  REASON FOR ASSESSMENT:   Consult for diet education  ASSESSMENT:   63 y.o. male with medical history of asthma, obesity, sleep apnea, and type 2 DM. He was admitted with severe shortness of breath, cough, and fevers of 102-104 at home. His symptoms began on 9/13. His wife and mother-in-law also presented at the same time as him for similar symptoms. He is not vaccinated against COVID-19. He was found to be COVID positive in the ED.  -COVID-19 PNA with symptoms beginning on 9/13 and dx on 9/20  Patient last documented as consuming 100% of meals on 9/21. Is drinking protein supplements ordered.   Placed Carbohydrate counting handout in discharge instructions. Will reach out to patient prior to discharge for education needs.  Admission weight: 238 lbs.   Medications: vitamin C, Multivitamin with minerals daily, Zinc sulfate Labs reviewed: CBGs: 231-262 Elevated Mg  Diet Order:   Diet Order            Diet Carb Modified Fluid consistency: Thin; Room service appropriate? Yes  Diet effective now                 EDUCATION NEEDS:   No education needs have been identified at this time  Skin:  Skin Assessment: Reviewed RN Assessment  Last BM:  PTA/unknown  Height:   Ht Readings from Last 1 Encounters:  04/24/20  5\' 9"  (1.753 m)    Weight:   Wt Readings from Last 1 Encounters:  04/24/20 108.4 kg    BMI:  Body mass index is 35.29 kg/m.  Estimated Nutritional Needs:   Kcal:  2637-8588 kcal (20-22 kcal/kg adjBW)  Protein:  105-115 grams  Fluid:  >/= 2 L/day  Clayton Bibles, MS, RD, LDN Inpatient Clinical Dietitian Contact information available via Amion

## 2020-04-28 ENCOUNTER — Inpatient Hospital Stay (HOSPITAL_COMMUNITY): Payer: BC Managed Care – PPO

## 2020-04-28 DIAGNOSIS — U071 COVID-19: Secondary | ICD-10-CM

## 2020-04-28 DIAGNOSIS — R7989 Other specified abnormal findings of blood chemistry: Secondary | ICD-10-CM

## 2020-04-28 LAB — COMPREHENSIVE METABOLIC PANEL
ALT: 34 U/L (ref 0–44)
AST: 34 U/L (ref 15–41)
Albumin: 2.7 g/dL — ABNORMAL LOW (ref 3.5–5.0)
Alkaline Phosphatase: 54 U/L (ref 38–126)
Anion gap: 11 (ref 5–15)
BUN: 27 mg/dL — ABNORMAL HIGH (ref 8–23)
CO2: 26 mmol/L (ref 22–32)
Calcium: 8.2 mg/dL — ABNORMAL LOW (ref 8.9–10.3)
Chloride: 102 mmol/L (ref 98–111)
Creatinine, Ser: 0.87 mg/dL (ref 0.61–1.24)
GFR calc Af Amer: >60 mL/min (ref >60)
GFR calc non Af Amer: >60 mL/min (ref >60)
Glucose, Bld: 161 mg/dL — ABNORMAL HIGH (ref 70–99)
Potassium: 3.7 mmol/L (ref 3.5–5.1)
Sodium: 139 mmol/L (ref 135–145)
Total Bilirubin: 0.6 mg/dL (ref 0.3–1.2)
Total Protein: 6.4 g/dL — ABNORMAL LOW (ref 6.5–8.1)

## 2020-04-28 LAB — GLUCOSE, CAPILLARY
Glucose-Capillary: 162 mg/dL — ABNORMAL HIGH (ref 70–99)
Glucose-Capillary: 241 mg/dL — ABNORMAL HIGH (ref 70–99)
Glucose-Capillary: 190 mg/dL — ABNORMAL HIGH (ref 70–99)
Glucose-Capillary: 136 mg/dL — ABNORMAL HIGH (ref 70–99)

## 2020-04-28 LAB — CBC WITH DIFFERENTIAL/PLATELET
Abs Immature Granulocytes: 0.56 10*3/uL — ABNORMAL HIGH (ref 0.00–0.07)
Basophils Absolute: 0.1 10*3/uL (ref 0.0–0.1)
Basophils Relative: 0 %
Eosinophils Absolute: 0.1 10*3/uL (ref 0.0–0.5)
Eosinophils Relative: 0 %
HCT: 40.2 % (ref 39.0–52.0)
Hemoglobin: 13.4 g/dL (ref 13.0–17.0)
Immature Granulocytes: 3 %
Lymphocytes Relative: 4 %
Lymphs Abs: 0.8 10*3/uL (ref 0.7–4.0)
MCH: 30.9 pg (ref 26.0–34.0)
MCHC: 33.3 g/dL (ref 30.0–36.0)
MCV: 92.6 fL (ref 80.0–100.0)
Monocytes Absolute: 0.9 10*3/uL (ref 0.1–1.0)
Monocytes Relative: 4 %
Neutro Abs: 19.6 10*3/uL — ABNORMAL HIGH (ref 1.7–7.7)
Neutrophils Relative %: 89 %
Platelets: 385 10*3/uL (ref 150–400)
RBC: 4.34 MIL/uL (ref 4.22–5.81)
RDW: 13 % (ref 11.5–15.5)
WBC: 21.9 10*3/uL — ABNORMAL HIGH (ref 4.0–10.5)
nRBC: 0 % (ref 0.0–0.2)

## 2020-04-28 LAB — D-DIMER, QUANTITATIVE: D-Dimer, Quant: 17.48 ug/mL-FEU — ABNORMAL HIGH (ref 0.00–0.50)

## 2020-04-28 LAB — MAGNESIUM: Magnesium: 2.4 mg/dL (ref 1.7–2.4)

## 2020-04-28 LAB — FERRITIN: Ferritin: 984 ng/mL — ABNORMAL HIGH (ref 24–336)

## 2020-04-28 LAB — C-REACTIVE PROTEIN: CRP: 8.3 mg/dL — ABNORMAL HIGH (ref <1.0)

## 2020-04-28 MED ORDER — GUAIFENESIN-CODEINE 100-10 MG/5ML PO SOLN
10.0000 mL | ORAL | Status: DC | PRN
  Administered 2020-04-29 – 2020-05-08 (×10): 10 mL via ORAL
  Filled 2020-04-28 (×10): qty 10

## 2020-04-28 MED ORDER — LORAZEPAM 2 MG/ML IJ SOLN
0.5000 mg | Freq: Three times a day (TID) | INTRAMUSCULAR | Status: DC | PRN
  Filled 2020-04-28: qty 1

## 2020-04-28 MED ORDER — TRAMADOL HCL 50 MG PO TABS
50.0000 mg | ORAL_TABLET | Freq: Three times a day (TID) | ORAL | Status: DC | PRN
  Administered 2020-05-11 – 2020-05-30 (×4): 50 mg via ORAL
  Filled 2020-04-28 (×4): qty 1

## 2020-04-28 MED ORDER — HYDRALAZINE HCL 50 MG PO TABS
50.0000 mg | ORAL_TABLET | Freq: Three times a day (TID) | ORAL | Status: DC
  Administered 2020-04-28 – 2020-04-29 (×3): 50 mg via ORAL
  Filled 2020-04-28 (×3): qty 1

## 2020-04-28 MED ORDER — BENZONATATE 100 MG PO CAPS
100.0000 mg | ORAL_CAPSULE | Freq: Three times a day (TID) | ORAL | Status: DC
  Administered 2020-04-28 – 2020-05-21 (×68): 100 mg via ORAL
  Filled 2020-04-28 (×69): qty 1

## 2020-04-28 MED ORDER — INSULIN DETEMIR 100 UNIT/ML ~~LOC~~ SOLN
20.0000 [IU] | Freq: Two times a day (BID) | SUBCUTANEOUS | Status: DC
  Administered 2020-04-28 – 2020-05-09 (×23): 20 [IU] via SUBCUTANEOUS
  Filled 2020-04-28 (×27): qty 0.2

## 2020-04-28 MED ORDER — MORPHINE SULFATE (PF) 2 MG/ML IV SOLN: 2.0000 mg | INTRAVENOUS | Status: DC | PRN

## 2020-04-28 MED ORDER — ENOXAPARIN SODIUM 120 MG/0.8ML ~~LOC~~ SOLN
110.0000 mg | Freq: Two times a day (BID) | SUBCUTANEOUS | Status: DC
  Administered 2020-04-28 – 2020-05-01 (×6): 110 mg via SUBCUTANEOUS
  Filled 2020-04-28 (×6): qty 0.74

## 2020-04-28 MED ORDER — ACETAMINOPHEN 325 MG PO TABS
650.0000 mg | ORAL_TABLET | Freq: Four times a day (QID) | ORAL | Status: DC | PRN
  Administered 2020-04-28 – 2020-05-30 (×4): 650 mg via ORAL
  Filled 2020-04-28 (×4): qty 2

## 2020-04-28 MED ORDER — ALPRAZOLAM 0.5 MG PO TABS
0.5000 mg | ORAL_TABLET | Freq: Once | ORAL | Status: AC
  Administered 2020-04-28: 0.5 mg via ORAL
  Filled 2020-04-28: qty 1

## 2020-04-28 MED ORDER — HYDRALAZINE HCL 25 MG PO TABS
25.0000 mg | ORAL_TABLET | Freq: Three times a day (TID) | ORAL | Status: DC
  Administered 2020-04-28: 25 mg via ORAL
  Filled 2020-04-28: qty 1

## 2020-04-28 NOTE — Progress Notes (Signed)
ANTICOAGULATION CONSULT NOTE - Initial Consult  Pharmacy Consult for Lovenox Indication: DVT  No Known Allergies  Patient Measurements: Height: 5\' 9"  (175.3 cm) Weight: 108.4 kg (238 lb 15.7 oz) IBW/kg (Calculated) : 70.7 Heparin Dosing Weight:   Vital Signs: Temp: 98.5 F (36.9 C) (09/24 1200) Temp Source: Oral (09/24 1200) BP: 163/80 (09/24 1200) Pulse Rate: 90 (09/24 1200)  Labs: Recent Labs    04/26/20 0303 04/26/20 0303 04/27/20 0251 04/28/20 0532  HGB 12.6*   < > 12.7* 13.4  HCT 38.0*  --  39.5 40.2  PLT 370  --  283 385  CREATININE 1.06  --  1.00 0.87   < > = values in this interval not displayed.    Estimated Creatinine Clearance: 106.8 mL/min (by C-G formula based on SCr of 0.87 mg/dL).   Medical History: Past Medical History:  Diagnosis Date  . Arthritis   . Asthma    pulmonary allergies- no asthma per pt  . Atrophic condition of skin   . Cellulitis/abscess - trunk   . Cyst    shoulder  . Hypertrophic condition of skin   . Lipoma   . Obesity   . Pneumonia    hx child  . Sleep apnea    no cpap  . Stones in the urinary tract   . Vitamin D deficiency     Medications:  Scheduled:  . (feeding supplement) PROSource Plus  30 mL Oral BID BM  . vitamin C  500 mg Oral Daily  . atorvastatin  40 mg Oral Daily  . baricitinib  4 mg Oral Daily  . benzonatate  100 mg Oral TID  . Chlorhexidine Gluconate Cloth  6 each Topical Daily  . chlorpheniramine-HYDROcodone  5 mL Oral Q12H  . enoxaparin (LOVENOX) injection  110 mg Subcutaneous Q12H  . feeding supplement ([employer] STANDARD 1.4)  325 mL Oral BID BM  . guaiFENesin  600 mg Oral BID  . hydrALAZINE  50 mg Oral Q8H  . insulin aspart  0-20 Units Subcutaneous TID WC  . insulin aspart  0-5 Units Subcutaneous QHS  . insulin aspart  6 Units Subcutaneous TID WC  . insulin detemir  20 Units Subcutaneous BID  . Ipratropium-Albuterol  1 puff Inhalation TID  . linagliptin  5 mg Oral Daily  . losartan  50 mg  Oral Daily  . mouth rinse  15 mL Mouth Rinse BID  . methylPREDNISolone (SOLU-MEDROL) injection  110 mg Intravenous Q12H  . montelukast  10 mg Oral QHS  . multivitamin with minerals  1 tablet Oral Daily  . sertraline  100 mg Oral Daily  . zinc sulfate  220 mg Oral Daily   Infusions:    Assessment: 36 yoM admitted on 9/20 with COVID19 pneumonia.  He has been on weight adjusted Lovenox 0.5 mg/kg [place] daily for VTE prophylaxis with no missed doses.  His last dose was on 9/23 PM.  Pharmacy is now consulted to increase to full dose Lovenox for VTE.  LE Dopplers: bilateral acute DVTs.  Ddimer: 0.92 > 2.7 > 17.48 (9/24) SCr 0.87, CrCl > 100 ml/mn CBC:  Hgb 13.4, Plt 385   Goal of Therapy:  Anti-Xa level 0.6-1 units/ml 4hrs after LMWH dose given Monitor platelets by anticoagulation protocol: Yes   Plan:  Lovenox 1 mg/kg (110 mg) [place] q12h Follow up renal function, CBC, s/s bleeding  Gretta Arab PharmD, BCPS Clinical Pharmacist [facility] main pharmacy [phone] 04/28/2020 4:55 PM

## 2020-04-28 NOTE — Progress Notes (Signed)
Bilateral lower extremity venous duplex completed. Refer to "CV Proc" under chart review to view preliminary results.  Preliminary results discussed with Wells Guiles, RN.  04/28/2020 3:42 PM Kelby Aline., MHA, RVT, RDCS, RDMS

## 2020-04-28 NOTE — Progress Notes (Signed)
[facility] Progress Note  Patient: Clayton [name]    WCB:[id number]  DOA: 04/24/2020     Date of Service: the patient was seen and examined on 04/28/2020  Brief hospital course: Past medical history of depression, asthma, OSA not on CPAP, type II DM, obesity.  Presents with complaints of cough and shortness of breath.  Found to have COVID-19 pneumonia.  Also found to have bilateral DVT. Currently plan is supportive care for hypoxia and treatment for DVT.  Assessment and Plan: 1.  Acute hypoxic respiratory failure, POA. Sepsis secondary to COVID-19 pneumonia POA. Meet SIRS criteria on admission with fever, tachypnea and tachycardia. Currently on baricitinib, IV Solu-Medrol, remdesivir as well as vitamins. Also on heated high flow along with nonrebreather. Continue the stepdown unit. Monitor. Supportive care for cough suppression.  2.  OSA. Not using any CPAP at home. Continue to monitor for now.  3.  Type 2 diabetes mellitus with hyperglycemia with hyperlipidemia Hemoglobin A1c 8.4. Currently on sliding scale insulin.  Blood sugar significant elevated therefore will adjusted. Continue Lipitor.  4.  Essential hypertension Continue losartan.  Hold HCTZ.  5.  Hypokalemia, hyponatremia Monitor for now  6.  Depression Continue home regimen.  7.  Obesity At risk for poor outcome in the setting of COVID-19 pneumonia. Monitor. Body mass index is 35.29 kg/m.  Nutrition Problem: Increased nutrient needs Etiology: acute illness, catabolic illness (OHYWV-37 infection) Interventions: Interventions: MVI, Other (Comment) (Prosource Plus; [employer] 1.4 po)  8.  Bilateral DVT. Initially on therapeutic Lovenox for the patient. Patient does not have any prior history of bleeding.  Diet: Cardiac and carb modified diet DVT Prophylaxis: Subcutaneous Lovenox  SCDs Start: 04/24/20 1628    Advance goals of care discussion: Full code  Family Communication: no family was  present at bedside, at the time of interview.   Disposition:  Status is: Inpatient  Remains inpatient appropriate because:Hemodynamically unstable  Dispo: The patient is from: Home              Anticipated d/c is to: SNF              Anticipated d/c date is: > 3 days              Patient currently is not medically stable to d/c.  ubjective: Continues to appear in severe distress.  No nausea no vomiting.  Has cough.  No abdominal pain, no chest pain.  Mild swelling in the leg.  Severe fatigue.  Physical Exam:  General: Appear in marked distress, no Rash; Oral Mucosa Clear, moist. no Abnormal Neck Mass Or lumps, Conjunctiva normal  Cardiovascular: S1 and S2 Present, no Murmur, Respiratory: increased respiratory effort, Bilateral Air entry present and bilateral  Crackles, no wheezes Abdomen: Bowel Sound present, Soft and no tenderness Extremities: trace Pedal edema Neurology: alert and oriented to time, place, and person affect anxious. no new focal deficit Gait not checked due to patient safety concerns  Vitals:   04/28/20 1500 04/28/20 1518 04/28/20 1600 04/28/20 1700  BP: (!) 156/63  134/68 (!) 125/56  Pulse: 86  70 71  Resp: (!) 26  (!) 34 17  Temp:   98.1 F (36.7 C)   TempSrc:   Oral   SpO2: (!) 82% (!) 87% 91% 91%  Weight:      Height:        Intake/Output Summary (Last 24 hours) at 04/28/2020 1836 Last data filed at 04/28/2020 0546 Gross per 24 hour  Intake --  Output  400 ml  Net -400 ml   Filed Weights   04/24/20 1400 04/24/20 1905  Weight: 111.1 kg 108.4 kg    Data Reviewed: I have personally reviewed and interpreted daily labs, tele strips, imagings as discussed above. I reviewed all nursing notes, pharmacy notes, vitals, pertinent old records I have discussed plan of care as described above with RN and patient/family.  CBC: Recent Labs  Lab 04/24/20 1405 04/25/20 0253 04/26/20 0303 04/27/20 0251 04/28/20 0532  WBC 8.2 4.3 11.8* 15.7* 21.9*   NEUTROABS 6.6 3.6 10.1* 13.5* 19.6*  HGB 12.9* 13.0 12.6* 12.7* 13.4  HCT 39.0 39.4 38.0* 39.5 40.2  MCV 90.7 91.8 92.2 93.6 92.6  PLT 279 324 370 283 220   Basic Metabolic Panel: Recent Labs  Lab 04/24/20 1405 04/25/20 0253 04/26/20 0303 04/27/20 0251 04/28/20 0532  NA 130* 135 132* 137 139  K 3.3* 4.0 3.7 3.9 3.7  CL 95* 98 99 99 102  CO2 25 26 24 25 26   GLUCOSE 245* 332* 384* 252* 161*  BUN 22 25* 28* 29* 27*  CREATININE 0.99 1.08 1.06 1.00 0.87  CALCIUM 7.7* 8.2* 8.2* 8.2* 8.2*  MG  --  2.8* 2.5* 2.5* 2.4  PHOS  --  3.8 2.6  --   --     Studies: VAS [place] LOWER EXTREMITY VENOUS (DVT)  Result Date: 04/28/2020  Lower Venous DVTStudy Indications: COVID-19 positive. D-dimer=17.48.  Comparison Study: No prior study Performing Technologist: Maudry Mayhew MHA, RDMS, RVT, RDCS  Examination Guidelines: A complete evaluation includes B-mode imaging, spectral Doppler, color Doppler, and power Doppler as needed of all accessible portions of each vessel. Bilateral testing is considered an integral part of a complete examination. Limited examinations for reoccurring indications may be performed as noted. The reflux portion of the exam is performed with the patient in reverse Trendelenburg.  +---------+---------------+---------+-----------+----------+--------------+ RIGHT    CompressibilityPhasicitySpontaneityPropertiesThrombus Aging +---------+---------------+---------+-----------+----------+--------------+ CFV      Full           Yes      Yes                                 +---------+---------------+---------+-----------+----------+--------------+ SFJ      Full                                                        +---------+---------------+---------+-----------+----------+--------------+ FV Prox  Full                                                        +---------+---------------+---------+-----------+----------+--------------+ FV Mid   Full                                                         +---------+---------------+---------+-----------+----------+--------------+ FV DistalFull                                                        +---------+---------------+---------+-----------+----------+--------------+  PFV      Full                                                        +---------+---------------+---------+-----------+----------+--------------+ POP      Full           Yes      Yes                                 +---------+---------------+---------+-----------+----------+--------------+ PTV      None                    No                   Acute          +---------+---------------+---------+-----------+----------+--------------+ PERO     None                    No                   Acute          +---------+---------------+---------+-----------+----------+--------------+   +---------+---------------+---------+-----------+----------+--------------+ LEFT     CompressibilityPhasicitySpontaneityPropertiesThrombus Aging +---------+---------------+---------+-----------+----------+--------------+ CFV      Full           Yes      Yes                                 +---------+---------------+---------+-----------+----------+--------------+ SFJ      Full                                                        +---------+---------------+---------+-----------+----------+--------------+ FV Prox  Full                                                        +---------+---------------+---------+-----------+----------+--------------+ FV Mid   Full                                                        +---------+---------------+---------+-----------+----------+--------------+ FV DistalFull                                                        +---------+---------------+---------+-----------+----------+--------------+ PFV      Full                                                         +---------+---------------+---------+-----------+----------+--------------+  POP      Full           Yes      Yes                                 +---------+---------------+---------+-----------+----------+--------------+ PTV      None                    No                   Acute          +---------+---------------+---------+-----------+----------+--------------+ Soleal   None                    No                   Acute          +---------+---------------+---------+-----------+----------+--------------+   Left Technical Findings: Not visualized segments include Inadequate visualization of peroneal veins.   Summary: RIGHT: - Findings consistent with acute deep vein thrombosis involving the right posterior tibial veins, and right peroneal veins. - No cystic structure found in the popliteal fossa.  LEFT: - Findings consistent with acute deep vein thrombosis involving the left posterior tibial veins, and left soleal veins. - No cystic structure found in the popliteal fossa.  *See table(s) above for measurements and observations. Electronically signed by Deitra Mayo MD on 04/28/2020 at 4:24:08 PM.    Final     Scheduled Meds: . (feeding supplement) PROSource Plus  30 mL Oral BID BM  . vitamin C  500 mg Oral Daily  . atorvastatin  40 mg Oral Daily  . baricitinib  4 mg Oral Daily  . benzonatate  100 mg Oral TID  . Chlorhexidine Gluconate Cloth  6 each Topical Daily  . chlorpheniramine-HYDROcodone  5 mL Oral Q12H  . enoxaparin (LOVENOX) injection  110 mg Subcutaneous Q12H  . feeding supplement ([employer] STANDARD 1.4)  325 mL Oral BID BM  . guaiFENesin  600 mg Oral BID  . hydrALAZINE  50 mg Oral Q8H  . insulin aspart  0-20 Units Subcutaneous TID WC  . insulin aspart  0-5 Units Subcutaneous QHS  . insulin aspart  6 Units Subcutaneous TID WC  . insulin detemir  20 Units Subcutaneous BID  . Ipratropium-Albuterol  1 puff Inhalation TID  . linagliptin  5 mg Oral Daily  .  losartan  50 mg Oral Daily  . mouth rinse  15 mL Mouth Rinse BID  . methylPREDNISolone (SOLU-MEDROL) injection  110 mg Intravenous Q12H  . montelukast  10 mg Oral QHS  . multivitamin with minerals  1 tablet Oral Daily  . sertraline  100 mg Oral Daily  . zinc sulfate  220 mg Oral Daily   Continuous Infusions: PRN Meds: acetaminophen, albuterol, guaiFENesin-codeine, LORazepam, methocarbamol, metoprolol tartrate, morphine injection, sodium chloride, traMADol  Time spent: 35 minutes  Author: Berle Mull, MD [facility] 04/28/2020 6:36 PM  To reach On-call, see care teams to locate the attending and reach out via [email or web address]. Between 7PM-7AM, please contact night-coverage If you still have difficulty reaching the attending provider, please page the [facility] (Director on Call) for [facility] on amion for assistance.

## 2020-04-29 ENCOUNTER — Inpatient Hospital Stay (HOSPITAL_COMMUNITY): Payer: BC Managed Care – PPO

## 2020-04-29 LAB — COMPREHENSIVE METABOLIC PANEL
ALT: 28 U/L (ref 0–44)
AST: 29 U/L (ref 15–41)
Albumin: 2.6 g/dL — ABNORMAL LOW (ref 3.5–5.0)
Alkaline Phosphatase: 58 U/L (ref 38–126)
Anion gap: 11 (ref 5–15)
BUN: 24 mg/dL — ABNORMAL HIGH (ref 8–23)
CO2: 29 mmol/L (ref 22–32)
Calcium: 8.4 mg/dL — ABNORMAL LOW (ref 8.9–10.3)
Chloride: 101 mmol/L (ref 98–111)
Creatinine, Ser: 0.75 mg/dL (ref 0.61–1.24)
GFR calc Af Amer: >60 mL/min (ref >60)
GFR calc non Af Amer: >60 mL/min (ref >60)
Glucose, Bld: 132 mg/dL — ABNORMAL HIGH (ref 70–99)
Potassium: 4.6 mmol/L (ref 3.5–5.1)
Sodium: 141 mmol/L (ref 135–145)
Total Bilirubin: 0.7 mg/dL (ref 0.3–1.2)
Total Protein: 6.5 g/dL (ref 6.5–8.1)

## 2020-04-29 LAB — CBC WITH DIFFERENTIAL/PLATELET
Abs Immature Granulocytes: 0.6 10*3/uL — ABNORMAL HIGH (ref 0.00–0.07)
Basophils Absolute: 0.1 10*3/uL (ref 0.0–0.1)
Basophils Relative: 0 %
Eosinophils Absolute: 0 10*3/uL (ref 0.0–0.5)
Eosinophils Relative: 0 %
HCT: 41.3 % (ref 39.0–52.0)
Hemoglobin: 13.3 g/dL (ref 13.0–17.0)
Immature Granulocytes: 2 %
Lymphocytes Relative: 3 %
Lymphs Abs: 0.7 10*3/uL (ref 0.7–4.0)
MCH: 30.2 pg (ref 26.0–34.0)
MCHC: 32.2 g/dL (ref 30.0–36.0)
MCV: 93.9 fL (ref 80.0–100.0)
Monocytes Absolute: 1.1 10*3/uL — ABNORMAL HIGH (ref 0.1–1.0)
Monocytes Relative: 5 %
Neutro Abs: 22.4 10*3/uL — ABNORMAL HIGH (ref 1.7–7.7)
Neutrophils Relative %: 90 %
Platelets: 366 10*3/uL (ref 150–400)
RBC: 4.4 MIL/uL (ref 4.22–5.81)
RDW: 13 % (ref 11.5–15.5)
WBC: 24.8 10*3/uL — ABNORMAL HIGH (ref 4.0–10.5)
nRBC: 0 % (ref 0.0–0.2)

## 2020-04-29 LAB — CULTURE, BLOOD (ROUTINE X 2)
Culture: NO GROWTH
Culture: NO GROWTH
Special Requests: ADEQUATE

## 2020-04-29 LAB — GLUCOSE, CAPILLARY
Glucose-Capillary: 129 mg/dL — ABNORMAL HIGH (ref 70–99)
Glucose-Capillary: 164 mg/dL — ABNORMAL HIGH (ref 70–99)
Glucose-Capillary: 178 mg/dL — ABNORMAL HIGH (ref 70–99)

## 2020-04-29 LAB — D-DIMER, QUANTITATIVE: D-Dimer, Quant: >20 ug/mL-FEU — ABNORMAL HIGH (ref 0.00–0.50)

## 2020-04-29 LAB — MAGNESIUM: Magnesium: 2.7 mg/dL — ABNORMAL HIGH (ref 1.7–2.4)

## 2020-04-29 LAB — FERRITIN: Ferritin: 1127 ng/mL — ABNORMAL HIGH (ref 24–336)

## 2020-04-29 LAB — C-REACTIVE PROTEIN: CRP: 20.6 mg/dL — ABNORMAL HIGH (ref <1.0)

## 2020-04-29 MED ORDER — LORAZEPAM 2 MG/ML IJ SOLN
0.5000 mg | INTRAMUSCULAR | Status: DC | PRN
  Administered 2020-04-29 – 2020-05-19 (×7): 0.5 mg via INTRAVENOUS
  Filled 2020-04-29 (×7): qty 1

## 2020-04-29 MED ORDER — FUROSEMIDE 10 MG/ML IJ SOLN
40.0000 mg | Freq: Once | INTRAMUSCULAR | Status: AC
  Administered 2020-04-29: 40 mg via INTRAVENOUS
  Filled 2020-04-29: qty 4

## 2020-04-29 MED ORDER — METOPROLOL TARTRATE 5 MG/5ML IV SOLN
5.0000 mg | INTRAVENOUS | Status: DC | PRN
  Filled 2020-04-29: qty 5

## 2020-04-29 MED ORDER — MORPHINE SULFATE (PF) 2 MG/ML IV SOLN
2.0000 mg | INTRAVENOUS | Status: DC | PRN
  Administered 2020-04-29 – 2020-05-28 (×24): 2 mg via INTRAVENOUS
  Filled 2020-04-29 (×26): qty 1

## 2020-04-29 NOTE — Progress Notes (Signed)
[facility] Progress Note  Patient: Clayton [name]    ACZ:[id number]  DOA: 04/24/2020     Date of Service: the patient was seen and examined on 04/29/2020  Brief hospital course: Past medical history of depression, asthma, OSA not on CPAP, type II DM, obesity.  Presents with complaints of cough and shortness of breath.  Found to have COVID-19 pneumonia.  Also found to have bilateral DVT. Currently plan is supportive care for hypoxia and treatment for DVT.  Assessment and Plan: 1.  Acute hypoxic respiratory failure, POA. Sepsis secondary to COVID-19 pneumonia POA. Meet SIRS criteria on admission with fever, tachypnea and tachycardia. Currently on baricitinib, IV Solu-Medrol, remdesivir as well as vitamins. Also on heated high flow along with nonrebreather. Currently has significant respiratory distress. Continue as needed morphine and Ativan for symptomatic relief. PCCM consult for assisting respiratory management.  2.  OSA. Not using any CPAP at home. Continue to monitor for now.  3.  Type 2 diabetes mellitus with hyperglycemia with hyperlipidemia Hemoglobin A1c 8.4. Currently on sliding scale insulin.  Blood sugar significant elevated therefore will adjusted. Continue Lipitor.  4.  Essential hypertension Continue losartan.  Hold HCTZ.  5.  Hypokalemia, hyponatremia Monitor for now  6.  Depression Continue home regimen.  7.  Obesity At risk for poor outcome in the setting of COVID-19 pneumonia. Monitor. Body mass index is 35.29 kg/m.  Nutrition Problem: Increased nutrient needs Etiology: acute illness, catabolic illness (FUXNA-35 infection) Interventions: Interventions: MVI, Other (Comment) (Prosource Plus; [employer] 1.4 po)  8.  Bilateral DVT. Initially on therapeutic Lovenox for the patient. Patient does not have any prior history of bleeding.  Diet: Cardiac and carb modified diet DVT Prophylaxis: Subcutaneous Lovenox  SCDs Start: 04/24/20 1628     Advance goals of care discussion: Full code  Family Communication: no family was present at bedside, at the time of interview.   Disposition:  Status is: Inpatient  Remains inpatient appropriate because:Hemodynamically unstable  Dispo: The patient is from: Home              Anticipated d/c is to: SNF              Anticipated d/c date is: > 3 days              Patient currently is not medically stable to d/c.  ubjective: Continues to have cough and shortness of breath but no nausea no vomiting.  Appears in severe distress with concern anytime patient is doing any exertional activity including having a bowel movement or bring to the bedside commode heart rate goes up to 120s and 130s as well as respiratory goes up to 50s.  Physical Exam:  General: Appear in marked distress, no Rash; Oral Mucosa Clear, moist. no Abnormal Neck Mass Or lumps, Conjunctiva normal  Cardiovascular: S1 and S2 Present, no Murmur, Respiratory: increased respiratory effort, Bilateral Air entry present and bilateral  Crackles, no wheezes Abdomen: Bowel Sound present, Soft and no tenderness Extremities: trace Pedal edema Neurology: alert and oriented to time, place, and person affect anxious. no new focal deficit Gait not checked due to patient safety concerns  Vitals:   04/29/20 1300 04/29/20 1400 04/29/20 1500 04/29/20 1600  BP: (!) 128/44  131/77   Pulse: 78 66 79 77  Resp: (!) 35 (!) 23 (!) 28 (!) 23  Temp:    98.3 F (36.8 C)  TempSrc:    Axillary  SpO2: 91% (!) 88% 90% (!) 87%  Weight:  Height:        Intake/Output Summary (Last 24 hours) at 04/29/2020 1923 Last data filed at 04/29/2020 1800 Gross per 24 hour  Intake --  Output 1350 ml  Net -1350 ml   Filed Weights   04/24/20 1400 04/24/20 1905  Weight: 111.1 kg 108.4 kg    Data Reviewed: I have personally reviewed and interpreted daily labs, tele strips, imagings as discussed above. I reviewed all nursing notes, pharmacy notes,  vitals, pertinent old records I have discussed plan of care as described above with RN and patient/family.  CBC: Recent Labs  Lab 04/25/20 0253 04/26/20 0303 04/27/20 0251 04/28/20 0532 04/29/20 0305  WBC 4.3 11.8* 15.7* 21.9* 24.8*  NEUTROABS 3.6 10.1* 13.5* 19.6* 22.4*  HGB 13.0 12.6* 12.7* 13.4 13.3  HCT 39.4 38.0* 39.5 40.2 41.3  MCV 91.8 92.2 93.6 92.6 93.9  PLT 324 370 283 385 400   Basic Metabolic Panel: Recent Labs  Lab 04/25/20 0253 04/26/20 0303 04/27/20 0251 04/28/20 0532 04/29/20 0305  NA 135 132* 137 139 141  K 4.0 3.7 3.9 3.7 4.6  CL 98 99 99 102 101  CO2 26 24 25 26 29   GLUCOSE 332* 384* 252* 161* 132*  BUN 25* 28* 29* 27* 24*  CREATININE 1.08 1.06 1.00 0.87 0.75  CALCIUM 8.2* 8.2* 8.2* 8.2* 8.4*  MG 2.8* 2.5* 2.5* 2.4 2.7*  PHOS 3.8 2.6  --   --   --     Studies: DG CHEST PORT 1 VIEW  Result Date: 04/29/2020 CLINICAL DATA:  64 year old male with history of COVID pneumonia, shortness of breath, and cough. EXAM: PORTABLE CHEST 1 VIEW COMPARISON:  04/24/2020 FINDINGS: Partial obscuration of the cardiomediastinal silhouette which is unchanged. Low lung volumes. There are scattered multifocal patchy pulmonary opacities with a peripheral prominence, now increased in the perihilar regions. No significant pleural effusion or evidence in a thorax. The visualized osseous structures are within normal limits. IMPRESSION: Interval worsening diffuse pulmonary opacities compatible with worsening COVID pneumonia. Electronically Signed   By: Ruthann Cancer MD   On: 04/29/2020 14:16    Scheduled Meds: . (feeding supplement) PROSource Plus  30 mL Oral BID BM  . vitamin C  500 mg Oral Daily  . baricitinib  4 mg Oral Daily  . benzonatate  100 mg Oral TID  . Chlorhexidine Gluconate Cloth  6 each Topical Daily  . chlorpheniramine-HYDROcodone  5 mL Oral Q12H  . enoxaparin (LOVENOX) injection  110 mg Subcutaneous Q12H  . feeding supplement ([employer] STANDARD 1.4)  325 mL  Oral BID BM  . guaiFENesin  600 mg Oral BID  . insulin aspart  0-20 Units Subcutaneous TID WC  . insulin aspart  0-5 Units Subcutaneous QHS  . insulin aspart  6 Units Subcutaneous TID WC  . insulin detemir  20 Units Subcutaneous BID  . Ipratropium-Albuterol  1 puff Inhalation TID  . linagliptin  5 mg Oral Daily  . losartan  50 mg Oral Daily  . mouth rinse  15 mL Mouth Rinse BID  . methylPREDNISolone (SOLU-MEDROL) injection  110 mg Intravenous Q12H  . montelukast  10 mg Oral QHS  . sertraline  100 mg Oral Daily  . zinc sulfate  220 mg Oral Daily   Continuous Infusions: PRN Meds: acetaminophen, albuterol, guaiFENesin-codeine, LORazepam, methocarbamol, metoprolol tartrate, morphine injection, sodium chloride, traMADol  Time spent: 35 minutes  Author: Berle Mull, MD [facility] 04/29/2020 7:23 PM  To reach On-call, see care teams to locate the  attending and reach out via [email or web address]. Between 7PM-7AM, please contact night-coverage If you still have difficulty reaching the attending provider, please page the [facility] (Director on Call) for [facility] on amion for assistance.

## 2020-04-29 NOTE — Progress Notes (Signed)
Nurse suggested this patient might be pleased to have a chaplain visit.  Patient was sleeping and very much out of it.  Chaplain had silent prayer for patient and his family. Conard Novak, Chaplain   04/29/20 1700  Clinical Encounter Type  Visited With Patient  Visit Type Initial;Spiritual support  Referral From Nurse  Consult/Referral To Chaplain  Spiritual Encounters  Spiritual Needs Prayer

## 2020-04-30 LAB — CBC WITH DIFFERENTIAL/PLATELET
Abs Immature Granulocytes: 0.94 10*3/uL — ABNORMAL HIGH (ref 0.00–0.07)
Basophils Absolute: 0.1 10*3/uL (ref 0.0–0.1)
Basophils Relative: 0 %
Eosinophils Absolute: 0 10*3/uL (ref 0.0–0.5)
Eosinophils Relative: 0 %
HCT: 44.3 % (ref 39.0–52.0)
Hemoglobin: 14.3 g/dL (ref 13.0–17.0)
Immature Granulocytes: 3 %
Lymphocytes Relative: 1 %
Lymphs Abs: 0.4 10*3/uL — ABNORMAL LOW (ref 0.7–4.0)
MCH: 30 pg (ref 26.0–34.0)
MCHC: 32.3 g/dL (ref 30.0–36.0)
MCV: 93.1 fL (ref 80.0–100.0)
Monocytes Absolute: 1 10*3/uL (ref 0.1–1.0)
Monocytes Relative: 3 %
Neutro Abs: 25.9 10*3/uL — ABNORMAL HIGH (ref 1.7–7.7)
Neutrophils Relative %: 93 %
Platelets: 453 10*3/uL — ABNORMAL HIGH (ref 150–400)
RBC: 4.76 MIL/uL (ref 4.22–5.81)
RDW: 13.2 % (ref 11.5–15.5)
WBC: 28.3 10*3/uL — ABNORMAL HIGH (ref 4.0–10.5)
nRBC: 0 % (ref 0.0–0.2)

## 2020-04-30 LAB — COMPREHENSIVE METABOLIC PANEL
ALT: 24 U/L (ref 0–44)
AST: 28 U/L (ref 15–41)
Albumin: 2.8 g/dL — ABNORMAL LOW (ref 3.5–5.0)
Alkaline Phosphatase: 75 U/L (ref 38–126)
Anion gap: 10 (ref 5–15)
BUN: 29 mg/dL — ABNORMAL HIGH (ref 8–23)
CO2: 28 mmol/L (ref 22–32)
Calcium: 8.1 mg/dL — ABNORMAL LOW (ref 8.9–10.3)
Chloride: 99 mmol/L (ref 98–111)
Creatinine, Ser: 0.94 mg/dL (ref 0.61–1.24)
GFR calc Af Amer: >60 mL/min (ref >60)
GFR calc non Af Amer: >60 mL/min (ref >60)
Glucose, Bld: 199 mg/dL — ABNORMAL HIGH (ref 70–99)
Potassium: 4.6 mmol/L (ref 3.5–5.1)
Sodium: 137 mmol/L (ref 135–145)
Total Bilirubin: 0.8 mg/dL (ref 0.3–1.2)
Total Protein: 6.9 g/dL (ref 6.5–8.1)

## 2020-04-30 LAB — GLUCOSE, CAPILLARY
Glucose-Capillary: 184 mg/dL — ABNORMAL HIGH (ref 70–99)
Glucose-Capillary: 255 mg/dL — ABNORMAL HIGH (ref 70–99)
Glucose-Capillary: 144 mg/dL — ABNORMAL HIGH (ref 70–99)
Glucose-Capillary: 119 mg/dL — ABNORMAL HIGH (ref 70–99)

## 2020-04-30 LAB — C-REACTIVE PROTEIN: CRP: 17.7 mg/dL — ABNORMAL HIGH (ref <1.0)

## 2020-04-30 LAB — D-DIMER, QUANTITATIVE: D-Dimer, Quant: 11.18 ug/mL-FEU — ABNORMAL HIGH (ref 0.00–0.50)

## 2020-04-30 MED ORDER — FUROSEMIDE 10 MG/ML IJ SOLN
20.0000 mg | Freq: Once | INTRAMUSCULAR | Status: AC
  Administered 2020-04-30: 20 mg via INTRAVENOUS
  Filled 2020-04-30: qty 2

## 2020-04-30 MED ORDER — IPRATROPIUM-ALBUTEROL 20-100 MCG/ACT IN AERS
1.0000 | INHALATION_SPRAY | Freq: Four times a day (QID) | RESPIRATORY_TRACT | Status: DC
  Administered 2020-04-30 – 2020-05-22 (×85): 1 via RESPIRATORY_TRACT
  Filled 2020-04-30 (×2): qty 4

## 2020-04-30 MED ORDER — OXYMETAZOLINE HCL 0.05 % NA SOLN
1.0000 | Freq: Two times a day (BID) | NASAL | Status: DC | PRN
  Filled 2020-04-30: qty 15

## 2020-04-30 NOTE — Plan of Care (Signed)
Discussed with patient plan of care for the evening, pain management and importance of eating tonight with some teach back displayed.  His wife was able to visit from room 1524 COVID+ as well and wanting to encourage him to eat tonight.  He ate tuna fish with some orange juice squeezed on it and drank a whole ensure.

## 2020-04-30 NOTE — Progress Notes (Addendum)
[facility] Progress Note  Patient: Clayton [name]    PPJ:[id number]  DOA: 04/24/2020     Date of Service: the patient was seen and examined on 04/30/2020  Brief hospital course: Past medical history of depression, asthma, OSA not on CPAP, type II DM, obesity.  Presents with complaints of cough and shortness of breath.  Found to have COVID-19 pneumonia.  Also found to have bilateral DVT. Currently plan is supportive care for hypoxia and treatment for DVT.  Assessment and Plan: 1.  Acute hypoxic respiratory failure, POA. Sepsis secondary to COVID-19 pneumonia POA. HFpEF Meet SIRS criteria on admission with fever, tachypnea and tachycardia. Currently on baricitinib, IV Solu-Medrol, remdesivir as well as vitamins. Acute COVID-19 Viral Pneumonia CXR: hazy bilateral peripheral opacities Oxygen requirement: On heated high flow, 100% FiO2 55 LPM, as well as NRB CRP: 20.0-12.0-6.6-8.3-20.6-70.7 D-dimer 0.7-0.9-two-point 5-17-more than 20-11 Remdesivir: Completed on 04/28/2020 Steroids: Currently on Solu-Medrol 1 mg/kg since admission Baricitinib/Actemra(off-label use): Currently on baricitinib since admission. The investigational nature of this medication was discussed with the patient/HCPOA and they choose to proceed as the potential benefits are felt to outweigh risks at this time. Diuretics given as needed Lasix, monitor renal function Antibiotics: Currently not indicated Vitamin C and Zinc: Continue Prone positioning and incentive spirometer use recommended.  Overall plan: Monitor closely in stepdown unit.  Low threshold to consult CCM for airway management should patient decompensate.  Patient would like to be full code.  The treatment plan and use of medications and known side effects were discussed with patient/family. It was clearly explained that Complete risks and long-term side effects are unknown, however in the best clinical judgment they seem to be of some clinical benefit  rather than medical risks. Patient/family agree with the treatment plan and want to receive these treatments as indicated.  Continue as needed morphine and Ativan for symptomatic relief. PCCM monitoring electronically for assisting respiratory management.  2.  OSA. Not using any CPAP at home. Continue to monitor for now.  3.  Type 2 diabetes mellitus with hyperglycemia with hyperlipidemia Hemoglobin A1c 8.4. Currently on sliding scale insulin.  Blood sugar significant elevated therefore will adjusted. Continue Lipitor.  4.  Bilateral DVT. Initially on therapeutic Lovenox for the patient. Patient does not have any prior history of bleeding.  5.  Hypokalemia, hyponatremia Monitor for now  6.  Depression Continue home regimen.  7.  Essential hypertension Continue losartan.  Hold HCTZ.  8.  Obesity At risk for poor outcome in the setting of COVID-19 pneumonia. Monitor. Body mass index is 35.29 kg/m.  Nutrition Problem: Increased nutrient needs Etiology: acute illness, catabolic illness (PYKDX-83 infection) Interventions: Interventions: MVI, Other (Comment) (Prosource Plus; [employer] 1.4 po)  Diet: Cardiac and carb modified diet DVT Prophylaxis: Subcutaneous Lovenox  SCDs Start: 04/24/20 1628    Advance goals of care discussion: Full code  Family Communication: no family was present at bedside, at the time of interview.   Disposition:  Status is: Inpatient  Remains inpatient appropriate because:Hemodynamically unstable  Dispo: The patient is from: Home              Anticipated d/c is to: SNF              Anticipated d/c date is: > 3 days              Patient currently is not medically stable to d/c.  Subjective: Continues to have shortness of breath as well as cough.  No nausea or vomiting.  No bleeding.  No fever no chills.  Reports tiredness.  Physical Exam:  General: Appear in marked distress, no Rash; Oral Mucosa Clear, moist. no Abnormal Neck Mass Or  lumps, Conjunctiva normal  Cardiovascular: S1 and S2 Present, no Murmur, Respiratory: increased respiratory effort, Bilateral Air entry present and bilateral  Crackles, no wheezes Abdomen: Bowel Sound present, Soft and no tenderness Extremities: no Pedal edema Neurology: alert and oriented to time, place, and person affect appropriate. no new focal deficit Gait not checked due to patient safety concerns  Vitals:   04/30/20 1400 04/30/20 1455 04/30/20 1500 04/30/20 1655  BP: (!) 125/55  (!) 112/39 (!) 128/35  Pulse: 67 (!) 54 70 79  Resp: (!) 28 (!) 26  (!) 30  Temp:    99.6 F (37.6 C)  TempSrc:    Axillary  SpO2: (!) 87% (!) 89% 91% (!) 86%  Weight:      Height:        Intake/Output Summary (Last 24 hours) at 04/30/2020 1930 Last data filed at 04/30/2020 1200 Gross per 24 hour  Intake --  Output 1800 ml  Net -1800 ml   Filed Weights   04/24/20 1400 04/24/20 1905  Weight: 111.1 kg 108.4 kg    Data Reviewed: I have personally reviewed and interpreted daily labs, tele strips, imagings as discussed above. I reviewed all nursing notes, pharmacy notes, vitals, pertinent old records I have discussed plan of care as described above with RN and patient/family.  CBC: Recent Labs  Lab 04/26/20 0303 04/27/20 0251 04/28/20 0532 04/29/20 0305 04/30/20 0347  WBC 11.8* 15.7* 21.9* 24.8* 28.3*  NEUTROABS 10.1* 13.5* 19.6* 22.4* 25.9*  HGB 12.6* 12.7* 13.4 13.3 14.3  HCT 38.0* 39.5 40.2 41.3 44.3  MCV 92.2 93.6 92.6 93.9 93.1  PLT 370 283 385 366 220*   Basic Metabolic Panel: Recent Labs  Lab 04/25/20 0253 04/25/20 0253 04/26/20 0303 04/27/20 0251 04/28/20 0532 04/29/20 0305 04/30/20 0347  NA 135   < > 132* 137 139 141 137  K 4.0   < > 3.7 3.9 3.7 4.6 4.6  CL 98   < > 99 99 102 101 99  CO2 26   < > 24 25 26 29 28   GLUCOSE 332*   < > 384* 252* 161* 132* 199*  BUN 25*   < > 28* 29* 27* 24* 29*  CREATININE 1.08   < > 1.06 1.00 0.87 0.75 0.94  CALCIUM 8.2*   < > 8.2*  8.2* 8.2* 8.4* 8.1*  MG 2.8*  --  2.5* 2.5* 2.4 2.7*  --   PHOS 3.8  --  2.6  --   --   --   --    < > = values in this interval not displayed.    Studies: No results found.  Scheduled Meds: . (feeding supplement) PROSource Plus  30 mL Oral BID BM  . vitamin C  500 mg Oral Daily  . baricitinib  4 mg Oral Daily  . benzonatate  100 mg Oral TID  . Chlorhexidine Gluconate Cloth  6 each Topical Daily  . chlorpheniramine-HYDROcodone  5 mL Oral Q12H  . enoxaparin (LOVENOX) injection  110 mg Subcutaneous Q12H  . feeding supplement ([employer] STANDARD 1.4)  325 mL Oral BID BM  . guaiFENesin  600 mg Oral BID  . insulin aspart  0-20 Units Subcutaneous TID WC  . insulin aspart  0-5 Units Subcutaneous QHS  . insulin aspart  6 Units Subcutaneous TID  WC  . insulin detemir  20 Units Subcutaneous BID  . Ipratropium-Albuterol  1 puff Inhalation QID  . linagliptin  5 mg Oral Daily  . losartan  50 mg Oral Daily  . mouth rinse  15 mL Mouth Rinse BID  . methylPREDNISolone (SOLU-MEDROL) injection  110 mg Intravenous Q12H  . montelukast  10 mg Oral QHS  . sertraline  100 mg Oral Daily  . zinc sulfate  220 mg Oral Daily   Continuous Infusions: PRN Meds: acetaminophen, albuterol, guaiFENesin-codeine, LORazepam, methocarbamol, metoprolol tartrate, morphine injection, oxymetazoline, sodium chloride, traMADol  Time spent: 35 minutes  Author: Berle Mull, MD [facility] 04/30/2020 7:30 PM  To reach On-call, see care teams to locate the attending and reach out via [email or web address]. Between 7PM-7AM, please contact night-coverage If you still have difficulty reaching the attending provider, please page the [facility] (Director on Call) for [facility] on amion for assistance.

## 2020-05-01 LAB — COMPREHENSIVE METABOLIC PANEL
ALT: 20 U/L (ref 0–44)
AST: 23 U/L (ref 15–41)
Albumin: 2.5 g/dL — ABNORMAL LOW (ref 3.5–5.0)
Alkaline Phosphatase: 65 U/L (ref 38–126)
Anion gap: 13 (ref 5–15)
BUN: 36 mg/dL — ABNORMAL HIGH (ref 8–23)
CO2: 27 mmol/L (ref 22–32)
Calcium: 8.3 mg/dL — ABNORMAL LOW (ref 8.9–10.3)
Chloride: 98 mmol/L (ref 98–111)
Creatinine, Ser: 1.06 mg/dL (ref 0.61–1.24)
GFR calc Af Amer: >60 mL/min (ref >60)
GFR calc non Af Amer: >60 mL/min (ref >60)
Glucose, Bld: 244 mg/dL — ABNORMAL HIGH (ref 70–99)
Potassium: 4.8 mmol/L (ref 3.5–5.1)
Sodium: 138 mmol/L (ref 135–145)
Total Bilirubin: 0.5 mg/dL (ref 0.3–1.2)
Total Protein: 6.3 g/dL — ABNORMAL LOW (ref 6.5–8.1)

## 2020-05-01 LAB — CBC WITH DIFFERENTIAL/PLATELET
Abs Immature Granulocytes: 0.95 10*3/uL — ABNORMAL HIGH (ref 0.00–0.07)
Basophils Absolute: 0.1 10*3/uL (ref 0.0–0.1)
Basophils Relative: 0 %
Eosinophils Absolute: 0 10*3/uL (ref 0.0–0.5)
Eosinophils Relative: 0 %
HCT: 43 % (ref 39.0–52.0)
Hemoglobin: 13.7 g/dL (ref 13.0–17.0)
Immature Granulocytes: 4 %
Lymphocytes Relative: 2 %
Lymphs Abs: 0.5 10*3/uL — ABNORMAL LOW (ref 0.7–4.0)
MCH: 29.8 pg (ref 26.0–34.0)
MCHC: 31.9 g/dL (ref 30.0–36.0)
MCV: 93.7 fL (ref 80.0–100.0)
Monocytes Absolute: 1 10*3/uL (ref 0.1–1.0)
Monocytes Relative: 4 %
Neutro Abs: 21.7 10*3/uL — ABNORMAL HIGH (ref 1.7–7.7)
Neutrophils Relative %: 90 %
Platelets: 467 10*3/uL — ABNORMAL HIGH (ref 150–400)
RBC: 4.59 MIL/uL (ref 4.22–5.81)
RDW: 13.2 % (ref 11.5–15.5)
WBC: 24.3 10*3/uL — ABNORMAL HIGH (ref 4.0–10.5)
nRBC: 0 % (ref 0.0–0.2)

## 2020-05-01 LAB — MAGNESIUM: Magnesium: 2.8 mg/dL — ABNORMAL HIGH (ref 1.7–2.4)

## 2020-05-01 LAB — C-REACTIVE PROTEIN: CRP: 12.3 mg/dL — ABNORMAL HIGH (ref <1.0)

## 2020-05-01 LAB — GLUCOSE, CAPILLARY
Glucose-Capillary: 243 mg/dL — ABNORMAL HIGH (ref 70–99)
Glucose-Capillary: 226 mg/dL — ABNORMAL HIGH (ref 70–99)
Glucose-Capillary: 167 mg/dL — ABNORMAL HIGH (ref 70–99)
Glucose-Capillary: 184 mg/dL — ABNORMAL HIGH (ref 70–99)

## 2020-05-01 LAB — D-DIMER, QUANTITATIVE: D-Dimer, Quant: 7.7 ug/mL-FEU — ABNORMAL HIGH (ref 0.00–0.50)

## 2020-05-01 MED ORDER — RIVAROXABAN 20 MG PO TABS
20.0000 mg | ORAL_TABLET | Freq: Every day | ORAL | Status: DC
  Administered 2020-05-22 – 2020-05-29 (×8): 20 mg via ORAL
  Filled 2020-05-01 (×8): qty 1

## 2020-05-01 MED ORDER — RIVAROXABAN 15 MG PO TABS
15.0000 mg | ORAL_TABLET | Freq: Two times a day (BID) | ORAL | Status: AC
  Administered 2020-05-01 – 2020-05-22 (×42): 15 mg via ORAL
  Filled 2020-05-01 (×43): qty 1

## 2020-05-01 MED ORDER — SENNOSIDES-DOCUSATE SODIUM 8.6-50 MG PO TABS
1.0000 | ORAL_TABLET | Freq: Two times a day (BID) | ORAL | Status: DC
  Administered 2020-05-01 (×2): 1 via ORAL
  Filled 2020-05-01 (×3): qty 1

## 2020-05-01 MED ORDER — HYDROCODONE-HOMATROPINE 5-1.5 MG/5ML PO SYRP
5.0000 mL | ORAL_SOLUTION | Freq: Four times a day (QID) | ORAL | Status: DC
  Administered 2020-05-01 – 2020-05-08 (×28): 5 mL via ORAL
  Filled 2020-05-01 (×28): qty 5

## 2020-05-01 NOTE — Progress Notes (Addendum)
[facility] Progress Note  Patient: Clayton [name]    JJH:[id number]  DOA: 04/24/2020     Date of Service: the patient was seen and examined on 05/01/2020  Brief hospital course: Past medical history of depression, asthma, OSA not on CPAP, type II DM, obesity. Presents with complaints of cough and shortness of breath. Found to have COVID-19 pneumonia.  Also found to have bilateral DVT. Currently plan is continue respiratory support.  Assessment and Plan: 1.Acute hypoxic respiratory failure, POA. Sepsis secondary to COVID-19 pneumonia POA. Acute on chronic HFpEF Acute COVID-19 Viral Pneumonia Meet SIRS criteria on admission with fever, tachypnea and tachycardia. CXR: hazy bilateral peripheral opacities Oxygen requirement: On heated high flow, 100% FiO2 50 LPM, as well as NRB CRP: 20.0-12.0-6.6-8.3-20.01-20-11 D-dimer 0.7-0.9-2.5-17-more than 20-11-7.7 Remdesivir: Completed on 04/28/2020 Steroids: Currently on Solu-Medrol 1 mg/kg since admission Baricitinib/Actemra(off-label use): Currently on baricitinib since admission. The investigational nature of this medication was discussed with the patient/HCPOA and they choose to proceed as the potential benefits are felt to outweigh risks at this time. Diuretics given as needed Lasix, monitor renal function Antibiotics: Currently not indicated Vitamin C and Zinc: Continue Prone positioning and incentive spirometer use recommended.  Overall plan: Monitor closely in stepdown unit.    Unable to tolerate BiPAP which led to significant anxiety. Low threshold to consult CCM for airway management should patient decompensate.  Patient would like to be full code.  Check echocardiogram  The treatment plan and use of medications and known side effects were discussed with patient/family. It was clearly explained that Complete risks and long-term side effects are unknown, however in the best clinical judgment they seem to be of some clinical  benefit rather than medical risks. Patient/family agree with the treatment plan and want to receive these treatments as indicated.  Continue as needed morphine and Ativan for symptomatic relief. PCCM monitoring electronically for assisting respiratory management.  2.OSA. Not using any CPAP at home. Not able to tolerate BiPAP for now. Continue to monitor for now.  3.Type 2 diabetes mellitus with hyperglycemia with hyperlipidemia Hemoglobin A1c 8.4. Currently on sliding scale insulin.  Blood sugar significant elevated therefore will adjusted. Continue Lipitor.  4. Bilateral DVT. Initially on therapeutic Lovenox for the patient. Currently on Xarelto. Patient does not have any prior history of bleeding.  5.Hypokalemia, hyponatremia Corrected. Monitor for now  6.Depression Continue home regimen.  7.Essential hypertension Continue losartan. Hold HCTZ.  8. Obesity At risk for poor outcome in the setting of COVID-19 pneumonia. Monitor. Body mass index is 35.29 kg/m.  Nutrition Problem: Increased nutrient needs Etiology: acute illness, catabolic illness (YJEHU-31 infection) Interventions: Interventions: MVI, Other (Comment) (Prosource Plus; [employer] 1.4 po)   9.  Anxiety. Use as needed morphine and Ativan in the setting of respiratory distress.  Diet: Regular diet DVT Prophylaxis:   SCDs Start: 04/24/20 1628 Rivaroxaban (XARELTO) tablet 15 mg  rivaroxaban (XARELTO) tablet 20 mg    Advance goals of care discussion: Full code  Family Communication: no family was present at bedside, at the time of interview.   Disposition:  Status is: Inpatient  Remains inpatient appropriate because:Hemodynamically unstable  Dispo: The patient is from: Home              Anticipated d/c is to: To be undermined              Anticipated d/c date is: To be determined              Patient currently is not  medically stable to d/c.  Subjective: Continues to have  severe shortness of breath.  No nausea or vomiting.  Later in the afternoon respiratory rate goes to 50s oxygenation goes to 60s this is despite on heated high flow as well as and NRB.  Unable to tolerate BiPAP.  Settling back on heated high flow.  Physical Exam:  General: Appear in severe distress, no Rash; Oral Mucosa Clear, moist. no Abnormal Neck Mass Or lumps, Conjunctiva normal  Cardiovascular: S1 and S2 Present, no Murmur, Respiratory: increased respiratory effort, Bilateral Air entry present and bilateral Crackles, no wheezes Abdomen: Bowel Sound present, Soft and no tenderness Extremities: no Pedal edema Neurology: alert and oriented to time, place, and person affect anxious. no new focal deficit Gait not checked due to patient safety concerns  Vitals:   05/01/20 1125 05/01/20 1137 05/01/20 1200 05/01/20 1453  BP:      Pulse:    66  Resp:  (!) 23  (!) 29  Temp:   98.2 F (36.8 C)   TempSrc:   Axillary   SpO2: (!) 88% 93%  (!) 88%  Weight:      Height:        Intake/Output Summary (Last 24 hours) at 05/01/2020 1613 Last data filed at 05/01/2020 1427 Gross per 24 hour  Intake 897 ml  Output 1725 ml  Net -828 ml   Filed Weights   04/24/20 1400 04/24/20 1905  Weight: 111.1 kg 108.4 kg   Data Reviewed: I have personally reviewed and interpreted daily labs, tele strips, imagings as discussed above. I reviewed all nursing notes, pharmacy notes, vitals, pertinent old records I have discussed plan of care as described above with RN and patient/family.  CBC: Recent Labs  Lab 04/27/20 0251 04/28/20 0532 04/29/20 0305 04/30/20 0347 05/01/20 0327  WBC 15.7* 21.9* 24.8* 28.3* 24.3*  NEUTROABS 13.5* 19.6* 22.4* 25.9* 21.7*  HGB 12.7* 13.4 13.3 14.3 13.7  HCT 39.5 40.2 41.3 44.3 43.0  MCV 93.6 92.6 93.9 93.1 93.7  PLT 283 385 366 453* 938*   Basic Metabolic Panel: Recent Labs  Lab 04/25/20 0253 04/25/20 0253 04/26/20 0303 04/26/20 0303 04/27/20 0251  04/28/20 0532 04/29/20 0305 04/30/20 0347 05/01/20 0327  NA 135   < > 132*   < > 137 139 141 137 138  K 4.0   < > 3.7   < > 3.9 3.7 4.6 4.6 4.8  CL 98   < > 99   < > 99 102 101 99 98  CO2 26   < > 24   < > 25 26 29 28 27   GLUCOSE 332*   < > 384*   < > 252* 161* 132* 199* 244*  BUN 25*   < > 28*   < > 29* 27* 24* 29* 36*  CREATININE 1.08   < > 1.06   < > 1.00 0.87 0.75 0.94 1.06  CALCIUM 8.2*   < > 8.2*   < > 8.2* 8.2* 8.4* 8.1* 8.3*  MG 2.8*   < > 2.5*  --  2.5* 2.4 2.7*  --  2.8*  PHOS 3.8  --  2.6  --   --   --   --   --   --    < > = values in this interval not displayed.    Studies: No results found.  Scheduled Meds: . (feeding supplement) PROSource Plus  30 mL Oral BID BM  . vitamin C  500 mg Oral Daily  .  baricitinib  4 mg Oral Daily  . benzonatate  100 mg Oral TID  . Chlorhexidine Gluconate Cloth  6 each Topical Daily  . feeding supplement ([employer] STANDARD 1.4)  325 mL Oral BID BM  . guaiFENesin  600 mg Oral BID  . HYDROcodone-homatropine  5 mL Oral QID  . insulin aspart  0-20 Units Subcutaneous TID WC  . insulin aspart  0-5 Units Subcutaneous QHS  . insulin aspart  6 Units Subcutaneous TID WC  . insulin detemir  20 Units Subcutaneous BID  . Ipratropium-Albuterol  1 puff Inhalation QID  . linagliptin  5 mg Oral Daily  . losartan  50 mg Oral Daily  . mouth rinse  15 mL Mouth Rinse BID  . methylPREDNISolone (SOLU-MEDROL) injection  110 mg Intravenous Q12H  . montelukast  10 mg Oral QHS  . Rivaroxaban  15 mg Oral BID WC   Followed by  . [START ON 05/22/2020] rivaroxaban  20 mg Oral Q supper  . senna-docusate  1 tablet Oral BID  . sertraline  100 mg Oral Daily  . zinc sulfate  220 mg Oral Daily   Continuous Infusions: PRN Meds: acetaminophen, albuterol, guaiFENesin-codeine, LORazepam, methocarbamol, metoprolol tartrate, morphine injection, oxymetazoline, sodium chloride, traMADol  Time spent: The patient is critically ill with multiple organ systems failure and  requires high complexity decision making for assessment and support, frequent evaluation and titration of therapies. Critical Care Time devoted to patient care services described in this note is 35 minutes   Author: Berle Mull, MD [facility] 05/01/2020 4:13 PM  To reach On-call, see care teams to locate the attending and reach out via [email or web address]. Between 7PM-7AM, please contact night-coverage If you still have difficulty reaching the attending provider, please page the [facility] (Director on Call) for [facility] on amion for assistance.

## 2020-05-01 NOTE — Progress Notes (Signed)
[facility] for Lovenox>>Xarelto Indication: DVT  No Known Allergies  Patient Measurements: Height: 5\' 9"  (175.3 cm) Weight: 108.4 kg (238 lb 15.7 oz) IBW/kg (Calculated) : 70.7 Heparin Dosing Weight:   Vital Signs: Temp: 97.8 F (36.6 C) (09/27 0800) Temp Source: Axillary (09/27 0800) BP: 146/63 (09/27 0600) Pulse Rate: 73 (09/27 0806)  Labs: Recent Labs    04/29/20 0305 04/29/20 0305 04/30/20 0347 05/01/20 0327  HGB 13.3   < > 14.3 13.7  HCT 41.3  --  44.3 43.0  PLT 366  --  453* 467*  CREATININE 0.75  --  0.94 1.06   < > = values in this interval not displayed.    Estimated Creatinine Clearance: 87.7 mL/min (by C-G formula based on SCr of 1.06 mg/dL).  Assessment: 42 yoM admitted on 9/20 with COVID19 pneumonia.  He has been on weight adjusted Lovenox 0.5 mg/kg [place] daily for VTE prophylaxis with no missed doses.  His last dose was on 9/23 PM.  Pharmacy is now consulted to increase to full dose Lovenox for VTE.  LE Dopplers: bilateral acute DVTs.  05/01/2020 To transition from full dose LMWH to Xarelto for B DVTs Last dose of LMWH 110 mg given this at at 0522 CBC WNL, no bleeding reported   Goal of Therapy:  Anti-Xa level 0.6-1 units/ml 4hrs after LMWH dose given Monitor platelets by anticoagulation protocol: Yes   Plan:  Xarelto 15 mg bid with food- start today at 1700 x 21 days then xarelto 20 mg qsupper Will provide 30 day free card and educate closer to discharge date  Eudelia Bunch, Pharm.D 05/01/2020 9:54 AM

## 2020-05-01 NOTE — Progress Notes (Signed)
RT attempted to place pt on BiPAP/NIV per MD verbal order. Pt states he felt like he couldn't breath when placed on the machine and his RR went into the 55s. RT took off BiPAP machine and placed pt back on HHFNC/NRB, pt immediately calmed down and said that he felt much better on that. Pt saturations are currently 88%. All vitals stable at this time. RT will continue to monitor.

## 2020-05-01 NOTE — Discharge Instructions (Addendum)
Information on my medicine - XARELTO (rivaroxaban)  This medication education was reviewed with me or my healthcare representative as part of my discharge preparation.   WHY WAS XARELTO PRESCRIBED FOR YOU? Xarelto was prescribed to treat blood clots that may have been found in the veins of your legs (deep vein thrombosis) or in your lungs (pulmonary embolism) and to reduce the risk of them occurring again.  What do you need to know about Xarelto? The dose is one 20 mg tablet taken ONCE A DAY with your evening meal.  DO NOT stop taking Xarelto without talking to the health care provider who prescribed the medication.  Refill your prescription for 20 mg tablets before you run out.  After discharge, you should have regular check-up appointments with your healthcare provider that is prescribing your Xarelto.  In the future your dose may need to be changed if your kidney function changes by a significant amount.  What do you do if you miss a dose? If you are taking Xarelto TWICE DAILY and you miss a dose, take it as soon as you remember. You may take two 15 mg tablets (total 30 mg) at the same time then resume your regularly scheduled 15 mg twice daily the next day.  If you are taking Xarelto ONCE DAILY and you miss a dose, take it as soon as you remember on the same day then continue your regularly scheduled once daily regimen the next day. Do not take two doses of Xarelto at the same time.   Important Safety Information Xarelto is a blood thinner medicine that can cause bleeding. You should call your healthcare provider right away if you experience any of the following: ? Bleeding from an injury or your nose that does not stop. ? Unusual colored urine (red or dark brown) or unusual colored stools (red or black). ? Unusual bruising for unknown reasons. ? A serious fall or if you hit your head (even if there is no bleeding).  Some medicines may interact with Xarelto and might increase  your risk of bleeding while on Xarelto. To help avoid this, consult your healthcare provider or pharmacist prior to using any new prescription or non-prescription medications, including herbals, vitamins, non-steroidal anti-inflammatory drugs (NSAIDs) and supplements.  This website has more information on Xarelto: [email or web address] Counting For People With Diabetes  Foods with carbohydrates make your blood glucose level go up. Learning how to count carbohydrates can help you control your blood glucose levels. First, identify the foods you eat that contain carbohydrates. Then, using the Foods with Carbohydrates chart, determine about how much carbohydrates are in your meals and snacks. Make sure you are eating foods with fiber, protein, and healthy fat along with your carbohydrate foods. Foods with Carbohydrates The following table shows carbohydrate foods that have about 15 grams of carbohydrate each. Using measuring cups, spoons, or a food scale when you first begin learning about carbohydrate counting can help you learn about the portion sizes you typically eat. The following foods have 15 grams carbohydrate each:  Grains . 1 slice bread (1 ounce)  . 1 small tortilla (6-inch size)  .  large bagel (1 ounce)  . 1/3 cup pasta or rice (cooked)  .  hamburger or hot dog bun ( ounce)  .  cup cooked cereal  .  to  cup ready-to-eat cereal  . 2 taco shells (5-inch size) Fruit . 1 small fresh fruit ( to 1 cup)  .  medium banana  . 17 small  grapes (3 ounces)  . 1 cup melon or berries  .  cup canned or frozen fruit  . 2 tablespoons dried fruit (blueberries, cherries, cranberries, raisins)  .  cup unsweetened fruit juice  Starchy Vegetables .  cup cooked beans, peas, corn, potatoes/sweet potatoes  .  large baked potato (3 ounces)  . 1 cup acorn or butternut squash  Snack Foods . 3 to 6 crackers  . 8 potato chips or 13 tortilla chips ( ounce to 1 ounce)  . 3 cups popped  popcorn  Dairy . 3/4 cup (6 ounces) nonfat plain yogurt, or yogurt with sugar-free sweetener  . 1 cup milk  . 1 cup plain rice, soy, coconut or flavored almond milk Sweets and Desserts .  cup ice cream or frozen yogurt  . 1 tablespoon jam, jelly, pancake syrup, table sugar, or honey  . 2 tablespoons light pancake syrup  . 1 inch square of frosted cake or 2 inch square of unfrosted cake  . 2 small cookies (2/3 ounce each) or  large cookie  Sometimes you'll have to estimate carbohydrate amounts if you don't know the exact recipe. One cup of mixed foods like soups can have 1 to 2 carbohydrate servings, while some casseroles might have 2 or more servings of carbohydrate. Foods that have less than 20 calories in each serving can be counted as "free" foods. Count 1 cup raw vegetables, or  cup cooked non-starchy vegetables as "free" foods. If you eat 3 or more servings at one meal, then count them as 1 carbohydrate serving.  Foods without Carbohydrates  Not all foods contain carbohydrates. Meat, some dairy, fats, non-starchy vegetables, and many beverages don't contain carbohydrate. So when you count carbohydrates, you can generally exclude chicken, pork, beef, fish, seafood, eggs, tofu, cheese, butter, sour cream, avocado, nuts, seeds, olives, mayonnaise, water, black coffee, unsweetened tea, and zero-calorie drinks. Vegetables with no or low carbohydrate include green beans, cauliflower, tomatoes, and onions. How much carbohydrate should I eat at each meal?  Carbohydrate counting can help you plan your meals and manage your weight. Following are some starting points for carbohydrate intake at each meal. Work with your registered dietitian nutritionist to find the best range that works for your blood glucose and weight.   To Lose Weight To Maintain Weight  Women 2 - 3 carb servings 3 - 4 carb servings  Men 3 - 4 carb servings 4 - 5 carb servings  Checking your blood glucose after meals will help you  know if you need to adjust the timing, type, or number of carbohydrate servings in your meal plan. Achieve and keep a healthy body weight by balancing your food intake and physical activity.  Tips How should I plan my meals?  Plan for half the food on your plate to include non-starchy vegetables, like salad greens, broccoli, or carrots. Try to eat 3 to 5 servings of non-starchy vegetables every day. Have a protein food at each meal. Protein foods include chicken, fish, meat, eggs, or beans (note that beans contain carbohydrate). These two food groups (non-starchy vegetables and proteins) are low in carbohydrate. If you fill up your plate with these foods, you will eat less carbohydrate but still fill up your stomach. Try to limit your carbohydrate portion to  of the plate.  What fats are healthiest to eat?  Diabetes increases risk for heart disease. To help protect your heart, eat more healthy fats, such as olive oil, nuts, and avocado. Eat less saturated  fats like butter, cream, and high-fat meats, like bacon and sausage. Avoid trans fats, which are in all foods that list "partially hydrogenated oil" as an ingredient. What should I drink?  Choose drinks that are not sweetened with sugar. The healthiest choices are water, carbonated or seltzer waters, and tea and coffee without added sugars.  Sweet drinks will make your blood glucose go up very quickly. One serving of soda or energy drink is  cup. It is best to drink these beverages only if your blood glucose is low.  Artificially sweetened, or diet drinks, typically do not increase your blood glucose if they have zero calories in them. Read labels of beverages, as some diet drinks do have carbohydrate and will raise your blood glucose. Label Reading Tips Read Nutrition Facts labels to find out how many grams of carbohydrate are in a food you want to eat. Don't forget: sometimes serving sizes on the label aren't the same as how much food you are going  to eat, so you may need to calculate how much carbohydrate is in the food you are serving yourself.   Carbohydrate Counting for People with Diabetes Sample 1-Day Menu  Breakfast  cup yogurt, low fat, low sugar (1 carbohydrate serving)   cup cereal, ready-to-eat, unsweetened (1 carbohydrate serving)  1 cup strawberries (1 carbohydrate serving)   cup almonds ( carbohydrate serving)  Lunch 1, 5 ounce can chunk light tuna  2 ounces cheese, low fat cheddar  6 whole wheat crackers (1 carbohydrate serving)  1 small apple (1 carbohydrate servings)   cup carrots ( carbohydrate serving)   cup snap peas  1 cup 1% milk (1 carbohydrate serving)   Evening Meal Stir fry made with: 3 ounces chicken  1 cup brown rice (3 carbohydrate servings)   cup broccoli ( carbohydrate serving)   cup green beans   cup onions  1 tablespoon olive oil  2 tablespoons teriyaki sauce ( carbohydrate serving)  Evening Snack 1 extra small banana (1 carbohydrate serving)  1 tablespoon peanut butter   Carbohydrate Counting for People with Diabetes Vegan Sample 1-Day Menu  Breakfast 1 cup cooked oatmeal (2 carbohydrate servings)   cup blueberries (1 carbohydrate serving)  2 tablespoons flaxseeds  1 cup soymilk fortified with calcium and vitamin D  1 cup coffee  Lunch 2 slices whole wheat bread (2 carbohydrate servings)   cup baked tofu   cup lettuce  2 slices tomato  2 slices avocado   cup baby carrots ( carbohydrate serving)  1 orange (1 carbohydrate serving)  1 cup soymilk fortified with calcium and vitamin D   Evening Meal Burrito made with: 1 6-inch corn tortilla (1 carbohydrate serving)  1 cup refried vegetarian beans (2 carbohydrate servings)   cup chopped tomatoes   cup lettuce   cup salsa  1/3 cup brown rice (1 carbohydrate serving)  1 tablespoon olive oil for rice   cup zucchini   Evening Snack 6 small whole grain crackers (1 carbohydrate serving)  2 apricots ( carbohydrate  serving)   cup unsalted peanuts ( carbohydrate serving)    Carbohydrate Counting for People with Diabetes Vegetarian (Lacto-Ovo) Sample 1-Day Menu  Breakfast 1 cup cooked oatmeal (2 carbohydrate servings)   cup blueberries (1 carbohydrate serving)  2 tablespoons flaxseeds  1 egg  1 cup 1% milk (1 carbohydrate serving)  1 cup coffee  Lunch 2 slices whole wheat bread (2 carbohydrate servings)  2 ounces low-fat cheese   cup lettuce  2 slices  tomato  2 slices avocado   cup baby carrots ( carbohydrate serving)  1 orange (1 carbohydrate serving)  1 cup unsweetened tea  Evening Meal Burrito made with: 1 6-inch corn tortilla (1 carbohydrate serving)   cup refried vegetarian beans (1 carbohydrate serving)   cup tomatoes   cup lettuce   cup salsa  1/3 cup brown rice (1 carbohydrate serving)  1 tablespoon olive oil for rice   cup zucchini  1 cup 1% milk (1 carbohydrate serving)  Evening Snack 6 small whole grain crackers (1 carbohydrate serving)  2 apricots ( carbohydrate serving)   cup unsalted peanuts ( carbohydrate serving)    Copyright 2020  [employer]. All rights reserved.  Using Nutrition Labels: Carbohydrate  . Serving Size  . Look at the serving size. All the information on the label is based on this portion. Randol Kern Per Container  . The number of servings contained in the package. . Guidelines for Carbohydrate  . Look at the total grams of carbohydrate in the serving size.  . 1 carbohydrate choice = 15 grams of carbohydrate. Range of Carbohydrate Grams Per Choice  Carbohydrate Grams/Choice Carbohydrate Choices  6-10   11-20 1  21-25 1  26-35 2  36-40 2  41-50 3  51-55 3  56-65 4  66-70 4  71-80 5    Copyright 2020  [employer]. All rights reserved.

## 2020-05-02 ENCOUNTER — Inpatient Hospital Stay (HOSPITAL_COMMUNITY): Payer: BC Managed Care – PPO

## 2020-05-02 DIAGNOSIS — I5030 Unspecified diastolic (congestive) heart failure: Secondary | ICD-10-CM

## 2020-05-02 LAB — COMPREHENSIVE METABOLIC PANEL
ALT: 22 U/L (ref 0–44)
AST: 28 U/L (ref 15–41)
Albumin: 2.7 g/dL — ABNORMAL LOW (ref 3.5–5.0)
Alkaline Phosphatase: 70 U/L (ref 38–126)
Anion gap: 11 (ref 5–15)
BUN: 31 mg/dL — ABNORMAL HIGH (ref 8–23)
CO2: 27 mmol/L (ref 22–32)
Calcium: 8.4 mg/dL — ABNORMAL LOW (ref 8.9–10.3)
Chloride: 101 mmol/L (ref 98–111)
Creatinine, Ser: 0.8 mg/dL (ref 0.61–1.24)
GFR calc Af Amer: >60 mL/min (ref >60)
GFR calc non Af Amer: >60 mL/min (ref >60)
Glucose, Bld: 199 mg/dL — ABNORMAL HIGH (ref 70–99)
Potassium: 5.2 mmol/L — ABNORMAL HIGH (ref 3.5–5.1)
Sodium: 139 mmol/L (ref 135–145)
Total Bilirubin: 0.6 mg/dL (ref 0.3–1.2)
Total Protein: 6.8 g/dL (ref 6.5–8.1)

## 2020-05-02 LAB — GLUCOSE, CAPILLARY
Glucose-Capillary: 220 mg/dL — ABNORMAL HIGH (ref 70–99)
Glucose-Capillary: 246 mg/dL — ABNORMAL HIGH (ref 70–99)
Glucose-Capillary: 263 mg/dL — ABNORMAL HIGH (ref 70–99)
Glucose-Capillary: 157 mg/dL — ABNORMAL HIGH (ref 70–99)

## 2020-05-02 LAB — CBC WITH DIFFERENTIAL/PLATELET
Abs Immature Granulocytes: 0.93 10*3/uL — ABNORMAL HIGH (ref 0.00–0.07)
Basophils Absolute: 0.1 10*3/uL (ref 0.0–0.1)
Basophils Relative: 0 %
Eosinophils Absolute: 0 10*3/uL (ref 0.0–0.5)
Eosinophils Relative: 0 %
HCT: 44.5 % (ref 39.0–52.0)
Hemoglobin: 14.4 g/dL (ref 13.0–17.0)
Immature Granulocytes: 3 %
Lymphocytes Relative: 2 %
Lymphs Abs: 0.6 10*3/uL — ABNORMAL LOW (ref 0.7–4.0)
MCH: 30.3 pg (ref 26.0–34.0)
MCHC: 32.4 g/dL (ref 30.0–36.0)
MCV: 93.5 fL (ref 80.0–100.0)
Monocytes Absolute: 1.3 10*3/uL — ABNORMAL HIGH (ref 0.1–1.0)
Monocytes Relative: 5 %
Neutro Abs: 24.4 10*3/uL — ABNORMAL HIGH (ref 1.7–7.7)
Neutrophils Relative %: 90 %
Platelets: 485 10*3/uL — ABNORMAL HIGH (ref 150–400)
RBC: 4.76 MIL/uL (ref 4.22–5.81)
RDW: 13.2 % (ref 11.5–15.5)
WBC: 27.3 10*3/uL — ABNORMAL HIGH (ref 4.0–10.5)
nRBC: 0 % (ref 0.0–0.2)

## 2020-05-02 LAB — ECHOCARDIOGRAM LIMITED
Area-P 1/2: 2.74 cm2
Height: 69 in
Weight: 3823.66 oz

## 2020-05-02 LAB — MAGNESIUM: Magnesium: 2.8 mg/dL — ABNORMAL HIGH (ref 1.7–2.4)

## 2020-05-02 LAB — D-DIMER, QUANTITATIVE: D-Dimer, Quant: 7.51 ug/mL-FEU — ABNORMAL HIGH (ref 0.00–0.50)

## 2020-05-02 LAB — C-REACTIVE PROTEIN: CRP: 7.6 mg/dL — ABNORMAL HIGH (ref <1.0)

## 2020-05-02 MED ORDER — FUROSEMIDE 10 MG/ML IJ SOLN
60.0000 mg | Freq: Two times a day (BID) | INTRAMUSCULAR | Status: DC
  Administered 2020-05-03 (×2): 60 mg via INTRAVENOUS
  Filled 2020-05-02 (×2): qty 6

## 2020-05-02 MED ORDER — HYDRALAZINE HCL 25 MG PO TABS
25.0000 mg | ORAL_TABLET | Freq: Four times a day (QID) | ORAL | Status: DC | PRN
  Filled 2020-05-02: qty 1

## 2020-05-02 MED ORDER — FUROSEMIDE 10 MG/ML IJ SOLN
60.0000 mg | Freq: Four times a day (QID) | INTRAMUSCULAR | Status: AC
  Administered 2020-05-02 (×3): 60 mg via INTRAVENOUS
  Filled 2020-05-02 (×3): qty 6

## 2020-05-02 MED ORDER — FUROSEMIDE 10 MG/ML IJ SOLN: 60.0000 mg | Freq: Two times a day (BID) | INTRAMUSCULAR | Status: DC

## 2020-05-02 MED ORDER — SENNOSIDES-DOCUSATE SODIUM 8.6-50 MG PO TABS
2.0000 | ORAL_TABLET | Freq: Two times a day (BID) | ORAL | Status: DC
  Administered 2020-05-02 – 2020-05-30 (×45): 2 via ORAL
  Filled 2020-05-02 (×51): qty 2

## 2020-05-02 NOTE — Progress Notes (Signed)
[facility] Progress Note  Patient: Clayton [name]    ZOX:[id number]  DOA: 04/24/2020     Date of Service: the patient was seen and examined on 05/02/2020  Brief hospital course: Past medical history of depression, asthma, OSA not on CPAP, type II DM, obesity. Presents with complaints of cough and shortness of breath. Found to have COVID-19 pneumonia.  Also found to have bilateral DVT.  Continues to have severe respiratory distress. Currently plan is continue respiratory support.  Assessment and Plan: 1.Acute hypoxic respiratory failure, POA. Sepsis secondary to COVID-19 pneumonia POA. Acute on chronic HFpEF Acute COVID-19 Viral Pneumonia Meet SIRS criteria on admission with fever, tachypnea and tachycardia. CXR: hazy bilateral peripheral opacities Oxygen requirement: On heated high flow, 100% FiO2 50 LPM, as well as NRB Unable to tolerate BiPAP. Low threshold to consult CCM for airway management.  Patient remains full code.  CRP: 20.0-12.0-6.6-8.3-20.01-20-11-7.6 D-dimer 0.7-0.9-2.5-17-more than 20-11-7.7-7.5 Remdesivir: Completed on 04/28/2020 Steroids: Currently on Solu-Medrol 1 mg/kg since admission, day 9. Baricitinib/Actemra(off-label use): Currently on baricitinib since admission.  Day 9. The investigational nature of this medication was discussed with the patient/HCPOA and they choose to proceed as the potential benefits are felt to outweigh risks at this time. Diuretics currently on Lasix 60 mg twice daily. Antibiotics: Currently not indicated Vitamin C and Zinc: Continue Prone positioning and incentive spirometer use recommended.  The treatment plan and use of medications and known side effects were discussed with patient/family. It was clearly explained that Complete risks and long-term side effects are unknown, however in the best clinical judgment they seem to be of some clinical benefit rather than medical risks. Patient/family agree with the treatment plan and  want to receive these treatments as indicated.  Continue as needed morphine and Ativan for symptomatic relief. PCCM monitoring electronically for assisting respiratory management.  2.OSA. Not using any CPAP at home. Not able to tolerate BiPAP for now. Continue to monitor for now.  3.Type 2 diabetes mellitus with hyperglycemia with hyperlipidemia Hemoglobin A1c 8.4. Currently on sliding scale insulin.  Blood sugar significant elevated therefore will adjusted. Continue Lipitor.  4. Bilateral DVT. Initially on therapeutic Lovenox for the patient. Currently on Xarelto. Patient does not have any prior history of bleeding.  5.Hypokalemia, hyponatremia Corrected. Monitor for now  6.Depression Continue home regimen.  7.Essential hypertension Continue losartan. Hold HCTZ.  8. Obesity At risk for poor outcome in the setting of COVID-19 pneumonia. Monitor. Body mass index is 35.29 kg/m.  Nutrition Problem: Increased nutrient needs Etiology: acute illness, catabolic illness ([id number] infection) Interventions: Interventions: MVI, Other (Comment) (Prosource Plus; [employer] 1.4 po)   9.  Anxiety. Use as needed morphine and Ativan in the setting of respiratory distress.  10.  Mild hyperkalemia. Currently on IV Lasix therefore anticipating improvement without any intervention.  Diet: Regular diet DVT Prophylaxis:   SCDs Start: 04/24/20 1628 Rivaroxaban (XARELTO) tablet 15 mg  rivaroxaban (XARELTO) tablet 20 mg    Advance goals of care discussion: Full code  Family Communication: no family was present at bedside, at the time of interview.   Disposition:  Status is: Inpatient  Remains inpatient appropriate because:Hemodynamically unstable Still on significant amount of oxygen.  Dispo: The patient is from: Home              Anticipated d/c is to: To be undermined              Anticipated d/c date is: To be determined  Patient currently is  not medically stable to d/c.  Subjective: Continues to have shortness of breath but no nausea or vomiting.  No fever no chills.  No chest pain.  No abdominal pain.  No bleeding.  Physical Exam:  General: Appear in severe distress, no Rash; Oral Mucosa Clear, moist. no Abnormal Neck Mass Or lumps, Conjunctiva normal  Cardiovascular: S1 and S2 Present, no Murmur, Respiratory: increased respiratory effort, Bilateral Air entry present and bilateral  Crackles, no wheezes Abdomen: Bowel Sound present, Soft and no tenderness Extremities: no Pedal edema Neurology: alert and oriented to time, place, and person affect appropriate. no new focal deficit Gait not checked due to patient safety concerns  Vitals:   05/02/20 1700 05/02/20 1800 05/02/20 1900 05/02/20 2022  BP:  (!) 107/34 (!) 112/42   Pulse:  76 67 81  Resp:  (!) 28 (!) 24 (!) 33  Temp: 98.7 F (37.1 C)     TempSrc: Axillary     SpO2:  91% (!) 87% (!) 88%  Weight:      Height:        Intake/Output Summary (Last 24 hours) at 05/02/2020 2025 Last data filed at 05/02/2020 1300 Gross per 24 hour  Intake 360 ml  Output 1525 ml  Net -1165 ml   Filed Weights   04/24/20 1400 04/24/20 1905  Weight: 111.1 kg 108.4 kg   Data Reviewed: I have personally reviewed and interpreted daily labs, tele strips, imagings as discussed above. I reviewed all nursing notes, pharmacy notes, vitals, pertinent old records I have discussed plan of care as described above with RN and patient/family.  CBC: Recent Labs  Lab 04/28/20 0532 04/29/20 0305 04/30/20 0347 05/01/20 0327 05/02/20 0257  WBC 21.9* 24.8* 28.3* 24.3* 27.3*  NEUTROABS 19.6* 22.4* 25.9* 21.7* 24.4*  HGB 13.4 13.3 14.3 13.7 14.4  HCT 40.2 41.3 44.3 43.0 44.5  MCV 92.6 93.9 93.1 93.7 93.5  PLT 385 366 453* 467* 981*   Basic Metabolic Panel: Recent Labs  Lab 04/26/20 0303 04/26/20 0303 04/27/20 0251 04/27/20 0251 04/28/20 0532 04/29/20 0305 04/30/20 0347 05/01/20 0327  05/02/20 0257  NA 132*   < > 137   < > 139 141 137 138 139  K 3.7   < > 3.9   < > 3.7 4.6 4.6 4.8 5.2*  CL 99   < > 99   < > 102 101 99 98 101  CO2 24   < > 25   < > 26 29 28 27 27   GLUCOSE 384*   < > 252*   < > 161* 132* 199* 244* 199*  BUN 28*   < > 29*   < > 27* 24* 29* 36* 31*  CREATININE 1.06   < > 1.00   < > 0.87 0.75 0.94 1.06 0.80  CALCIUM 8.2*   < > 8.2*   < > 8.2* 8.4* 8.1* 8.3* 8.4*  MG 2.5*   < > 2.5*  --  2.4 2.7*  --  2.8* 2.8*  PHOS 2.6  --   --   --   --   --   --   --   --    < > = values in this interval not displayed.    Studies: DG CHEST PORT 1 VIEW  Result Date: 05/02/2020 CLINICAL DATA:  Shortness of breath.  Sepsis.  COVID positive. EXAM: PORTABLE CHEST 1 VIEW COMPARISON:  Two thousand twenty-one. FINDINGS: Heart size stable. Diffuse severe bilateral pulmonary infiltrates/edema  again noted. No pleural effusion or pneumothorax. Degenerative change thoracic spine. IMPRESSION: Diffuse severe bilateral pulmonary infiltrates/edema again noted. Age. Electronically Signed   By: Marcello Moores  Register   On: 05/02/2020 09:16   ECHOCARDIOGRAM LIMITED  Result Date: 05/02/2020    ECHOCARDIOGRAM LIMITED REPORT   Patient Name:   JERL MUNYAN Date of Exam: 05/02/2020 Medical Rec #:  [id number]      Height:       69.0 in Accession #:    [id number]     Weight:       239.0 lb Date of Birth:  03-Dec-1955       BSA:          2.228 m Patient Age:    28 years       BP:           163/60 mmHg Patient Gender: M              HR:           108 bpm. Exam Location:  Inpatient Procedure: Limited Echo, Limited Color Doppler, Cardiac Doppler and Intracardiac            Opacification Agent Indications:    Congestive Heart Failure 428.0 / I50.9  History:        Patient has no prior history of Echocardiogram examinations.                 Signs/Symptoms:Shortness of Breath; Risk Factors:Sleep Apnea and                 Diabetes. COVID 19 pneumonia. HFpEF.  Sonographer:    Darlina Sicilian RDCS Referring Phys: [id number]  [facility] M Javian Nudd  Sonographer Comments: Technically difficult study due to poor echo windows. Image acquisition challenging due to respiratory motion. IMPRESSIONS  1. Limited study with poor echo windows, Definity contrast given  2. Left ventricular ejection fraction, by estimation, is 65 to 70%. The left ventricle has normal function. The left ventricle has no regional wall motion abnormalities. There is mild left ventricular hypertrophy. Left ventricular diastolic parameters are consistent with Grade I diastolic dysfunction (impaired relaxation).  3. The aortic valve is tricuspid. Aortic valve regurgitation is not visualized. Mild aortic valve sclerosis is present, with no evidence of aortic valve stenosis. FINDINGS  Left Ventricle: Left ventricular ejection fraction, by estimation, is 65 to 70%. The left ventricle has normal function. The left ventricle has no regional wall motion abnormalities. Definity contrast agent was given IV to delineate the left ventricular  endocardial borders. There is mild left ventricular hypertrophy. Left ventricular diastolic parameters are consistent with Grade I diastolic dysfunction (impaired relaxation). Indeterminate filling pressures. Aortic Valve: The aortic valve is tricuspid. Aortic valve regurgitation is not visualized. Mild aortic valve sclerosis is present, with no evidence of aortic valve stenosis. Pulmonic Valve: The pulmonic valve was normal in structure. Pulmonic valve regurgitation is not visualized.  Diastology LV e' medial:    7.62 cm/s LV E/e' medial:  9.6 LV e' lateral:   6.09 cm/s LV E/e' lateral: 12.0  AORTIC VALVE LVOT Vmax:   148.00 cm/s LVOT Vmean:  104.000 cm/s LVOT VTI:    0.254 m MITRAL VALVE MV Area (PHT): 2.74 cm    SHUNTS MV Decel Time: 277 msec    Systemic VTI: 0.25 m MV E velocity: 72.80 cm/s MV A velocity: 44.60 cm/s MV E/A ratio:  1.63 Lyman Bishop MD Electronically signed by Lyman Bishop MD Signature Date/Time: 05/02/2020/12:31:57 PM    Final  Scheduled Meds: . (feeding supplement) PROSource Plus  30 mL Oral BID BM  . vitamin C  500 mg Oral Daily  . baricitinib  4 mg Oral Daily  . benzonatate  100 mg Oral TID  . Chlorhexidine Gluconate Cloth  6 each Topical Daily  . feeding supplement ([employer] STANDARD 1.4)  325 mL Oral BID BM  . furosemide  60 mg Intravenous Q6H  . [START ON 05/03/2020] furosemide  60 mg Intravenous BID  . guaiFENesin  600 mg Oral BID  . HYDROcodone-homatropine  5 mL Oral QID  . insulin aspart  0-20 Units Subcutaneous TID WC  . insulin aspart  0-5 Units Subcutaneous QHS  . insulin aspart  6 Units Subcutaneous TID WC  . insulin detemir  20 Units Subcutaneous BID  . Ipratropium-Albuterol  1 puff Inhalation QID  . linagliptin  5 mg Oral Daily  . losartan  50 mg Oral Daily  . mouth rinse  15 mL Mouth Rinse BID  . methylPREDNISolone (SOLU-MEDROL) injection  110 mg Intravenous Q12H  . montelukast  10 mg Oral QHS  . Rivaroxaban  15 mg Oral BID WC   Followed by  . [START ON 05/22/2020] rivaroxaban  20 mg Oral Q supper  . senna-docusate  2 tablet Oral BID  . sertraline  100 mg Oral Daily  . zinc sulfate  220 mg Oral Daily   Continuous Infusions: PRN Meds: acetaminophen, albuterol, guaiFENesin-codeine, hydrALAZINE, LORazepam, methocarbamol, metoprolol tartrate, morphine injection, oxymetazoline, sodium chloride, traMADol  Time spent: The patient is critically ill with multiple organ systems failure and requires high complexity decision making for assessment and support, frequent evaluation and titration of therapies. Critical Care Time devoted to patient care services described in this note is 35 minutes   Author: Berle Mull, MD [facility] 05/02/2020 8:25 PM  To reach On-call, see care teams to locate the attending and reach out via [email or web address]. Between 7PM-7AM, please contact night-coverage If you still have difficulty reaching the attending provider, please page the [facility] (Director on Call)  for [facility] on amion for assistance.

## 2020-05-02 NOTE — Plan of Care (Signed)
Discussed with patient plan of care for the evening, pain management and encouraging patient to eat or drink with some efforts made with some teach back displayed

## 2020-05-02 NOTE — Progress Notes (Signed)
Patient stated after calming him down and addressing his coughing he felt more relaxed.  Patient may need more visits from his wife in room 1524 and possibly a different and bigger room to help with his mood.  Patient admits to being scared.

## 2020-05-02 NOTE — Progress Notes (Signed)
  Echocardiogram 2D Echocardiogram limited with definity has been performed.  Darlina Sicilian M 05/02/2020, 12:19 PM

## 2020-05-02 NOTE — Plan of Care (Signed)

## 2020-05-03 DIAGNOSIS — E1165 Type 2 diabetes mellitus with hyperglycemia: Secondary | ICD-10-CM

## 2020-05-03 DIAGNOSIS — R739 Hyperglycemia, unspecified: Secondary | ICD-10-CM

## 2020-05-03 DIAGNOSIS — J9601 Acute respiratory failure with hypoxia: Secondary | ICD-10-CM

## 2020-05-03 DIAGNOSIS — U071 COVID-19: Secondary | ICD-10-CM

## 2020-05-03 DIAGNOSIS — E876 Hypokalemia: Secondary | ICD-10-CM

## 2020-05-03 LAB — COMPREHENSIVE METABOLIC PANEL
ALT: 25 U/L (ref 0–44)
AST: 28 U/L (ref 15–41)
Albumin: 2.7 g/dL — ABNORMAL LOW (ref 3.5–5.0)
Alkaline Phosphatase: 71 U/L (ref 38–126)
Anion gap: 13 (ref 5–15)
BUN: 53 mg/dL — ABNORMAL HIGH (ref 8–23)
CO2: 28 mmol/L (ref 22–32)
Calcium: 8.4 mg/dL — ABNORMAL LOW (ref 8.9–10.3)
Chloride: 95 mmol/L — ABNORMAL LOW (ref 98–111)
Creatinine, Ser: 1.3 mg/dL — ABNORMAL HIGH (ref 0.61–1.24)
GFR calc Af Amer: >60 mL/min (ref >60)
GFR calc non Af Amer: 58 mL/min — ABNORMAL LOW (ref >60)
Glucose, Bld: 333 mg/dL — ABNORMAL HIGH (ref 70–99)
Potassium: 5 mmol/L (ref 3.5–5.1)
Sodium: 136 mmol/L (ref 135–145)
Total Bilirubin: 0.5 mg/dL (ref 0.3–1.2)
Total Protein: 6.6 g/dL (ref 6.5–8.1)

## 2020-05-03 LAB — CBC WITH DIFFERENTIAL/PLATELET
Abs Immature Granulocytes: 0.71 10*3/uL — ABNORMAL HIGH (ref 0.00–0.07)
Basophils Absolute: 0.1 10*3/uL (ref 0.0–0.1)
Basophils Relative: 0 %
Eosinophils Absolute: 0 10*3/uL (ref 0.0–0.5)
Eosinophils Relative: 0 %
HCT: 44.9 % (ref 39.0–52.0)
Hemoglobin: 14.6 g/dL (ref 13.0–17.0)
Immature Granulocytes: 3 %
Lymphocytes Relative: 2 %
Lymphs Abs: 0.5 10*3/uL — ABNORMAL LOW (ref 0.7–4.0)
MCH: 30.2 pg (ref 26.0–34.0)
MCHC: 32.5 g/dL (ref 30.0–36.0)
MCV: 93 fL (ref 80.0–100.0)
Monocytes Absolute: 1 10*3/uL (ref 0.1–1.0)
Monocytes Relative: 4 %
Neutro Abs: 24.1 10*3/uL — ABNORMAL HIGH (ref 1.7–7.7)
Neutrophils Relative %: 91 %
Platelets: 465 10*3/uL — ABNORMAL HIGH (ref 150–400)
RBC: 4.83 MIL/uL (ref 4.22–5.81)
RDW: 13.1 % (ref 11.5–15.5)
WBC: 26.3 10*3/uL — ABNORMAL HIGH (ref 4.0–10.5)
nRBC: 0 % (ref 0.0–0.2)

## 2020-05-03 LAB — D-DIMER, QUANTITATIVE: D-Dimer, Quant: 8.28 ug/mL-FEU — ABNORMAL HIGH (ref 0.00–0.50)

## 2020-05-03 LAB — GLUCOSE, CAPILLARY
Glucose-Capillary: 204 mg/dL — ABNORMAL HIGH (ref 70–99)
Glucose-Capillary: 140 mg/dL — ABNORMAL HIGH (ref 70–99)
Glucose-Capillary: 154 mg/dL — ABNORMAL HIGH (ref 70–99)
Glucose-Capillary: 132 mg/dL — ABNORMAL HIGH (ref 70–99)

## 2020-05-03 LAB — C-REACTIVE PROTEIN: CRP: 4.9 mg/dL — ABNORMAL HIGH (ref <1.0)

## 2020-05-03 LAB — MAGNESIUM: Magnesium: 2.6 mg/dL — ABNORMAL HIGH (ref 1.7–2.4)

## 2020-05-03 NOTE — Consult Note (Signed)
NAME:  Clayton [name], MRN:  [id number], DOB:  1956/03/10, LOS: 9 ADMISSION DATE:  04/24/2020, CONSULTATION DATE:  05/03/2020 REFERRING MD:  Dr Posey Pronto, CHIEF COMPLAINT:  Acute hypoxic respiratory failure  Brief History   Asked to see patient for hypoxic respiratory failure Patient being treated for Covid pneumonia, bilateral DVT Increasing oxygen requirement History of present illness   Patient was having shortness of breath, fever of 102.1 at home Symptoms started September 13 Unvaccinated Covid positive on admission Severe hypoxemia at presentation with sats of about 72%   Has been in the hospital started on remdesivir, Decadron, baricitinib Past Medical History   Past Medical History:  Diagnosis Date  . Arthritis   . Asthma    pulmonary allergies- no asthma per pt  . Atrophic condition of skin   . Cellulitis/abscess - trunk   . Cyst    shoulder  . Hypertrophic condition of skin   . Lipoma   . Obesity   . Pneumonia    hx child  . Sleep apnea    no cpap  . Stones in the urinary tract   . Vitamin D deficiency    Significant Hospital Events   Chest x-ray 9/28: Diffuse bilateral pulmonary infiltrates/edema  Consults:  pccm 9/28  Procedures:  None  Significant Diagnostic Tests:  Chest x-ray 9/28  Echocardiogram 9/28-diastolic dysfunction Vascular ultrasound lower extremity 9/24 showing DVTs Micro Data:  9/20 blood cultures negative  Antimicrobials:  Remdesivir 9/22 9/25 Baricitinib 9/20>>  Interim history/subjective:  Patient states he feels a little bit better today Noted to have increasing oxygen requirement  Objective   Blood pressure (!) 111/55, pulse 79, temperature 98.3 F (36.8 C), temperature source Oral, resp. rate (!) 26, height 5\' 9"  (1.753 m), weight 108.4 kg, SpO2 (!) 89 %.    FiO2 (%):  [100 %] 100 %   Intake/Output Summary (Last 24 hours) at 05/03/2020 1410 Last data filed at 05/03/2020 1224 Gross per 24 hour  Intake 340 ml  Output 3175  ml  Net -2835 ml   Filed Weights   04/24/20 1400 04/24/20 1905  Weight: 111.1 kg 108.4 kg    Examination: General: Middle-age, does not appear to be in acute distress, not using accessory muscles or respiration HENT: Moist oral mucosa Lungs: Decreased air movement bilaterally Cardiovascular: S1-S2 appreciated Abdomen: Soft, bowel sounds appreciated Extremities: No clubbing, no edema Neuro: Alert and oriented x3 GU:   Resolved Hospital Problem list     Assessment & Plan:  Acute hypoxic respiratory failure covid 19 pneumonia -continue oxygen supplementation -Encouraged to prone press tolerated-states he has some abdominal discomfort whenever he is going to be prone -Not tolerating BiPAP -Complete baricitinib -Continue steroids  Diastolic dysfunction -Continues on cautious diuresis  Obstructive sleep apnea -Monitor closely -Not able to tolerate NIPPV  Type 2 diabetes  DVT -On anticoagulation  Morbid obesity   Patient does not appear to have significant respiratory distress Work of breathing is tolerable Continue oxygen supplementation as long as able to keep her saturations of 85% -He is currently running 91 to 92% with oxygen supplementation  We will continue to follow with you Escalation to placing patient on a ventilator if needed   Best practice:  Diet: Per primary Glucose control: SSI Mobility: Bedrest Code Status: Full code Disposition: Stepdown  Labs   CBC: Recent Labs  Lab 04/29/20 0305 04/30/20 0347 05/01/20 0327 05/02/20 0257 05/03/20 0247  WBC 24.8* 28.3* 24.3* 27.3* 26.3*  NEUTROABS 22.4* 25.9* 21.7* 24.4* 24.1*  HGB 13.3 14.3 13.7 14.4 14.6  HCT 41.3 44.3 43.0 44.5 44.9  MCV 93.9 93.1 93.7 93.5 93.0  PLT 366 453* 467* 485* 465*    Basic Metabolic Panel: Recent Labs  Lab 04/28/20 0532 04/28/20 0532 04/29/20 0305 04/30/20 0347 05/01/20 0327 05/02/20 0257 05/03/20 0247  NA 139   < > 141 137 138 139 136  K 3.7   < > 4.6 4.6  4.8 5.2* 5.0  CL 102   < > 101 99 98 101 95*  CO2 26   < > 29 28 27 27 28   GLUCOSE 161*   < > 132* 199* 244* 199* 333*  BUN 27*   < > 24* 29* 36* 31* 53*  CREATININE 0.87   < > 0.75 0.94 1.06 0.80 1.30*  CALCIUM 8.2*   < > 8.4* 8.1* 8.3* 8.4* 8.4*  MG 2.4  --  2.7*  --  2.8* 2.8* 2.6*   < > = values in this interval not displayed.   GFR: Estimated Creatinine Clearance: 71.5 mL/min (A) (by C-G formula based on SCr of 1.3 mg/dL (H)). Recent Labs  Lab 04/30/20 0347 05/01/20 0327 05/02/20 0257 05/03/20 0247  WBC 28.3* 24.3* 27.3* 26.3*    Liver Function Tests: Recent Labs  Lab 04/29/20 0305 04/30/20 0347 05/01/20 0327 05/02/20 0257 05/03/20 0247  AST 29 28 23 28 28   ALT 28 24 20 22 25   ALKPHOS 58 75 65 70 71  BILITOT 0.7 0.8 0.5 0.6 0.5  PROT 6.5 6.9 6.3* 6.8 6.6  ALBUMIN 2.6* 2.8* 2.5* 2.7* 2.7*   No results for input(s): LIPASE, AMYLASE in the last 168 hours. No results for input(s): AMMONIA in the last 168 hours.  ABG    Component Value Date/Time   TCO2 24 12/22/2007 1148     Coagulation Profile: No results for input(s): INR, PROTIME in the last 168 hours.  Cardiac Enzymes: No results for input(s): CKTOTAL, CKMB, CKMBINDEX, TROPONINI in the last 168 hours.  HbA1C: Hgb A1c MFr Bld  Date/Time Value Ref Range Status  04/25/2020 09:50 AM 8.4 (H) 4.8 - 5.6 % Final    Comment:    (NOTE) Pre diabetes:          5.7%-6.4%  Diabetes:              >6.4%  Glycemic control for   <7.0% adults with diabetes     CBG: Recent Labs  Lab 05/02/20 1304 05/02/20 1652 05/02/20 2313 05/03/20 0821 05/03/20 1216  GLUCAP 246* 263* 157* 204* 140*    Review of Systems:   States his breathing is stable at present Past Medical History  He,  has a past medical history of Arthritis, Asthma, Atrophic condition of skin, Cellulitis/abscess - trunk, Cyst, Hypertrophic condition of skin, Lipoma, Obesity, Pneumonia, Sleep apnea, Stones in the urinary tract, and Vitamin D  deficiency.   Surgical History    Past Surgical History:  Procedure Laterality Date  . cyst removed from [place]  . INSERTION OF MESH N/A 05/23/2015   Procedure: INSERTION OF MESH;  Surgeon: [name], MD;  Location: [place];  Service: General;  Laterality: N/A;  . MASS EXCISION  07/15/11   left shoulder mass   . NASAL SINUS SURGERY---UVULA REMOVED  2004   Dr. Wilburn Cornelia  . PROSTATE BIOPSY  9/16  . TONSILLECTOMY    . UMBILICAL HERNIA REPAIR N/A 05/23/2015   Procedure: LAPAROSCOPIC UMBILICAL HERNIA REPAIR ;  Surgeon: [name], MD;  Location: [facility]  OR;  Service: General;  Laterality: N/A;     Social History   reports that he has never smoked. He has never used smokeless tobacco. He reports previous alcohol use of about 10.0 standard drinks of alcohol per week. He reports that he does not use drugs.   Family History   His family history includes Allergies in his mother; Asthma in his mother; Heart attack in his father; Heart disease in his father.   Allergies No Known Allergies   The patient is critically ill with multiple organ systems failure and requires high complexity decision making for assessment and support, frequent evaluation and titration of therapies, application of advanced monitoring technologies and extensive interpretation of multiple databases. Critical Care Time devoted to patient care services described in this note independent of APP/resident time (if applicable)  is 32 minutes.   Sherrilyn Rist MD [facility] Personal pager: [phone] If unanswered, please page CCM On-call: [phone]

## 2020-05-03 NOTE — TOC Progression Note (Signed)
Transition of Care [facility]) - Progression Note    Patient Details  Name: Degan Hanser MRN: [id number] Date of Birth: [birth year]  [facility] CM/SW Contact  Leeroy Cha, RN Phone Number: 05/03/2020, 10:12 AM  Clinical Narrative:    Past medical history of depression, asthma, OSA not on CPAP, type II DM, obesity. Presents with complaints of cough and shortness of breath. Found to have COVID-19 pneumonia. Also found to have bilateral DVT.  Continues to have severe respiratory distress. Currently plan is continue respiratory support. toc plan home with self care, following for progressionb o2 at 60l/min via nrb, iv solu medrol, bun 53 creat 1.30, crp=4.9, d. Dimer 8.28, wbc 26.3.  Expected Discharge Plan: Home/Self Care Barriers to Discharge: Continued Medical Work up  Expected Discharge Plan and Services Expected Discharge Plan: Home/Self Care   Discharge Planning Services: CM Consult   Living arrangements for the past 2 months: Single Family Home                                       Social Determinants of Health (SDOH) Interventions    Readmission Risk Interventions No flowsheet data found.

## 2020-05-03 NOTE — Progress Notes (Signed)
PROGRESS NOTE  [name]  NGE:[id number] DOB: [birth year] DOA: 04/24/2020 PCP: Kristen Loader, FNP   Brief Narrative: Clayton [name] is a 64 y.o. male with a history of depression, asthma, OSA not on CPAP, type II DM, obesity. Presents with complaints of cough and shortness of breath. Found to have COVID-19 pneumonia. Also found to have bilateral DVT. He has been treated in the ICU/SDU with heated high flow due to persistent and severe hypoxemia.   Assessment & Plan: Active Problems:   Acute hypoxemic respiratory failure due to COVID-19 (HCC)   Hypokalemia   Hyperglycemia   Uncontrolled type 2 diabetes mellitus with hyperglycemia (HCC)  Acute hypoxic respiratory failure and sepsis secondary to COVID-19 pneumonia POA. Sepsis has resolved. - Continues to require heated high flow oxygen. Appreciate PCCM involvement as he is at risk of deterioration and is full code.  - Continue monitoring CRP, continue steroids, baricitinib. S/p remdesivir.   - Stop diuresis with bump in creatinine. G1DD, preserved LVEF on limited echo 9/28.   OSA: Not on CPAP  Uncontrolled T2DM with hyperglycemia: HbA1c 8.4%.  - Control improving, continue SSI, insulin detemir, and linagliptin.   Acute bilateral lower extremity DVTs: - Continue xarelto (s/p lovenox)  Hypokalemia, hyponatremia - Monitoring  Depression, anxiety:  - Continue home regimen. Continue prn ativan and morphine  Essential hypertension - Continue losartan. Hold HCTZ while giving lasix.  Obesity: Body mass index is 35.29 kg/m.   Hyperkalemia: Resolved.   DVT prophylaxis: Xarelto Code Status: Full Family Communication: None at bedside Disposition Plan:  Status is: Inpatient  Remains inpatient appropriate because:Inpatient level of care appropriate due to severity of illness   Dispo: The patient is from: Home              Anticipated d/c is to: TBD              Anticipated d/c date is: > 3 days              Patient  currently is not medically stable to d/c.  Consultants:   PCCM  Procedures:   None  Antimicrobials:  Remdesivir   Subjective: Actually feels he's breathing a bit better, but with any movement in the bed at all, desaturates quickly despite 60L/100% FiO2, higher that previous days. Also gets confused intermittently, takes off mask, and desaturates quickly per RN.   Objective: Vitals:   05/03/20 1450 05/03/20 1600 05/03/20 1700 05/03/20 1800  BP:  115/60 122/68 116/68  Pulse: 69 (!) 57 60 64  Resp: (!) 26 17 18 20   Temp:  (!) 96.8 F (36 C)    TempSrc:  Axillary    SpO2: 91% (!) 89% 90% 90%  Weight:      Height:        Intake/Output Summary (Last 24 hours) at 05/03/2020 1827 Last data filed at 05/03/2020 1633 Gross per 24 hour  Intake 680 ml  Output 3175 ml  Net -2495 ml   Filed Weights   04/24/20 1400 04/24/20 1905  Weight: 111.1 kg 108.4 kg   Gen: 64 y.o. male in no distress Pulm: Non-labored breathing HHF, crackles diffusely, no wheezes.  CV: Regular rate and rhythm. No murmur, rub, or gallop. No JVD, no pitting pedal edema. GI: Abdomen soft, non-tender, non-distended, with normoactive bowel sounds. No organomegaly or masses felt. Ext: Warm, no deformities Skin: No rashes, lesions or ulcers Neuro: Alert and oriented. No focal neurological deficits. Psych: Judgement and insight appear normal. Mood & affect appropriate.  Data Reviewed: I have personally reviewed following labs and imaging studies  CBC: Recent Labs  Lab 04/29/20 0305 04/30/20 0347 05/01/20 0327 05/02/20 0257 05/03/20 0247  WBC 24.8* 28.3* 24.3* 27.3* 26.3*  NEUTROABS 22.4* 25.9* 21.7* 24.4* 24.1*  HGB 13.3 14.3 13.7 14.4 14.6  HCT 41.3 44.3 43.0 44.5 44.9  MCV 93.9 93.1 93.7 93.5 93.0  PLT 366 453* 467* 485* 664*   Basic Metabolic Panel: Recent Labs  Lab 04/28/20 0532 04/28/20 0532 04/29/20 0305 04/30/20 0347 05/01/20 0327 05/02/20 0257 05/03/20 0247  NA 139   < > 141 137 138  139 136  K 3.7   < > 4.6 4.6 4.8 5.2* 5.0  CL 102   < > 101 99 98 101 95*  CO2 26   < > 29 28 27 27 28   GLUCOSE 161*   < > 132* 199* 244* 199* 333*  BUN 27*   < > 24* 29* 36* 31* 53*  CREATININE 0.87   < > 0.75 0.94 1.06 0.80 1.30*  CALCIUM 8.2*   < > 8.4* 8.1* 8.3* 8.4* 8.4*  MG 2.4  --  2.7*  --  2.8* 2.8* 2.6*   < > = values in this interval not displayed.   GFR: Estimated Creatinine Clearance: 71.5 mL/min (A) (by C-G formula based on SCr of 1.3 mg/dL (H)). Liver Function Tests: Recent Labs  Lab 04/29/20 0305 04/30/20 0347 05/01/20 0327 05/02/20 0257 05/03/20 0247  AST 29 28 23 28 28   ALT 28 24 20 22 25   ALKPHOS 58 75 65 70 71  BILITOT 0.7 0.8 0.5 0.6 0.5  PROT 6.5 6.9 6.3* 6.8 6.6  ALBUMIN 2.6* 2.8* 2.5* 2.7* 2.7*   No results for input(s): LIPASE, AMYLASE in the last 168 hours. No results for input(s): AMMONIA in the last 168 hours. Coagulation Profile: No results for input(s): INR, PROTIME in the last 168 hours. Cardiac Enzymes: No results for input(s): CKTOTAL, CKMB, CKMBINDEX, TROPONINI in the last 168 hours. BNP (last 3 results) No results for input(s): PROBNP in the last 8760 hours. HbA1C: No results for input(s): HGBA1C in the last 72 hours. CBG: Recent Labs  Lab 05/02/20 1652 05/02/20 2313 05/03/20 0821 05/03/20 1216 05/03/20 1628  GLUCAP 263* 157* 204* 140* 154*   Lipid Profile: No results for input(s): CHOL, HDL, LDLCALC, TRIG, CHOLHDL, LDLDIRECT in the last 72 hours. Thyroid Function Tests: No results for input(s): TSH, T4TOTAL, FREET4, T3FREE, THYROIDAB in the last 72 hours. Anemia Panel: No results for input(s): VITAMINB12, FOLATE, FERRITIN, TIBC, IRON, RETICCTPCT in the last 72 hours. Urine analysis:    Component Value Date/Time   COLORURINE YELLOW 12/22/2007 1120   APPEARANCEUR CLEAR 12/22/2007 1120   LABSPEC 1.011 12/22/2007 1120   PHURINE 6.0 12/22/2007 1120   GLUCOSEU 100 (A) 12/22/2007 1120   HGBUR MODERATE (A) 12/22/2007 1120    HGBUR negative 12/21/2007 0000   BILIRUBINUR NEGATIVE 12/22/2007 1120   KETONESUR NEGATIVE 12/22/2007 1120   PROTEINUR NEGATIVE 12/22/2007 1120   UROBILINOGEN 0.2 12/22/2007 1120   NITRITE NEGATIVE 12/22/2007 1120   LEUKOCYTESUR NEGATIVE 12/22/2007 1120   Recent Results (from the past 240 hour(s))  SARS Coronavirus 2 by RT PCR (hospital order, performed in [facility] hospital lab) Nasopharyngeal Nasopharyngeal Swab     Status: Abnormal   Collection Time: 04/24/20  2:06 PM   Specimen: Nasopharyngeal Swab  Result Value Ref Range Status   SARS Coronavirus 2 POSITIVE (A) NEGATIVE Final    Comment: CRITICAL RESULT CALLED TO,  READ BACK BY AND VERIFIED WITH: K.ZULETA,RN [id number] @1542  BY V.WILKINS (NOTE) SARS-CoV-2 target nucleic acids are DETECTED  SARS-CoV-2 RNA is generally detectable in upper respiratory specimens  during the acute phase of infection.  Positive results are indicative  of the presence of the identified virus, but do not rule out bacterial infection or co-infection with other pathogens not detected by the test.  Clinical correlation with patient history and  other diagnostic information is necessary to determine patient infection status.  The expected result is negative.  Fact Sheet for Patients:   StrictlyIdeas.no   Fact Sheet for Healthcare Providers:   BankingDealers.co.za    This test is not yet approved or cleared by the [place] FDA and  has been authorized for detection and/or diagnosis of SARS-CoV-2 by FDA under an Emergency Use Authorization (EUA).  This EUA will remain in effect (mea ning this test can be used) for the duration of  the COVID-19 declaration under Section 564(b)(1) of the Act, 21 U.S.C. section 360-bbb-3(b)(1), unless the authorization is terminated or revoked sooner.  Performed at [facility], [place] [address] [place], [place] [address]   Blood Culture (routine x 2)      Status: None   Collection Time: 04/24/20  2:06 PM   Specimen: BLOOD  Result Value Ref Range Status   Specimen Description   Final    BLOOD LEFT ANTECUBITAL Performed at [facility] [address] [place], [place] [address]    Special Requests   Final    BOTTLES DRAWN AEROBIC AND ANAEROBIC Blood Culture adequate volume Performed at [facility] [address], [place], [place] [address]    Culture   Final    NO GROWTH 5 DAYS Performed at [facility], [place] [address] [place], [place] [address]    Report Status 04/29/2020 FINAL  Final  Blood Culture (routine x 2)     Status: None   Collection Time: 04/24/20  2:06 PM   Specimen: BLOOD  Result Value Ref Range Status   Specimen Description   Final    BLOOD SITE NOT SPECIFIED Performed at [facility] [address], [place], [place] [address]    Special Requests   Final    BOTTLES DRAWN AEROBIC AND ANAEROBIC Blood Culture results may not be optimal due to an excessive volume of blood received in culture bottles Performed at [facility] [address], [place], [place] [address]    Culture   Final    NO GROWTH 5 DAYS Performed at [facility], [place] [address], [place], [place] [address]    Report Status 04/29/2020 FINAL  Final  MRSA PCR Screening     Status: None   Collection Time: 04/24/20  5:00 PM   Specimen: Nasopharyngeal  Result Value Ref Range Status   MRSA by PCR NEGATIVE NEGATIVE Final    Comment:        The GeneXpert MRSA Assay (FDA approved for NASAL specimens only), is one component of a comprehensive MRSA colonization surveillance program. It is not intended to diagnose MRSA infection nor to guide or monitor treatment for MRSA infections. Performed at [facility], [place] [address], [place], [place] [address]       Radiology Studies: DG CHEST PORT 1 VIEW  Result Date: 05/02/2020 CLINICAL DATA:  Shortness of  breath.  Sepsis.  COVID positive. EXAM: PORTABLE CHEST 1 VIEW COMPARISON:  Two thousand twenty-one. FINDINGS:  Heart size stable. Diffuse severe bilateral pulmonary infiltrates/edema again noted. No pleural effusion or pneumothorax. Degenerative change thoracic spine. IMPRESSION: Diffuse severe bilateral pulmonary infiltrates/edema again noted. Age. Electronically Signed   By: Marcello Moores  Register   On: 05/02/2020 09:16   ECHOCARDIOGRAM LIMITED  Result Date: 05/02/2020    ECHOCARDIOGRAM LIMITED REPORT   Patient Name:   COLE EASTRIDGE Date of Exam: 05/02/2020 Medical Rec #:  [id number]      Height:       69.0 in Accession #:    [id number]     Weight:       239.0 lb Date of Birth:  28-Mar-1956       BSA:          2.228 m Patient Age:    55 years       BP:           163/60 mmHg Patient Gender: M              HR:           108 bpm. Exam Location:  Inpatient Procedure: Limited Echo, Limited Color Doppler, Cardiac Doppler and Intracardiac            Opacification Agent Indications:    Congestive Heart Failure 428.0 / I50.9  History:        Patient has no prior history of Echocardiogram examinations.                 Signs/Symptoms:Shortness of Breath; Risk Factors:Sleep Apnea and                 Diabetes. COVID 19 pneumonia. HFpEF.  Sonographer:    Darlina Sicilian RDCS Referring Phys: [id number] [facility] M PATEL  Sonographer Comments: Technically difficult study due to poor echo windows. Image acquisition challenging due to respiratory motion. IMPRESSIONS  1. Limited study with poor echo windows, Definity contrast given  2. Left ventricular ejection fraction, by estimation, is 65 to 70%. The left ventricle has normal function. The left ventricle has no regional wall motion abnormalities. There is mild left ventricular hypertrophy. Left ventricular diastolic parameters are consistent with Grade I diastolic dysfunction (impaired relaxation).  3. The aortic valve is tricuspid. Aortic valve regurgitation is not visualized. Mild aortic  valve sclerosis is present, with no evidence of aortic valve stenosis. FINDINGS  Left Ventricle: Left ventricular ejection fraction, by estimation, is 65 to 70%. The left ventricle has normal function. The left ventricle has no regional wall motion abnormalities. Definity contrast agent was given IV to delineate the left ventricular  endocardial borders. There is mild left ventricular hypertrophy. Left ventricular diastolic parameters are consistent with Grade I diastolic dysfunction (impaired relaxation). Indeterminate filling pressures. Aortic Valve: The aortic valve is tricuspid. Aortic valve regurgitation is not visualized. Mild aortic valve sclerosis is present, with no evidence of aortic valve stenosis. Pulmonic Valve: The pulmonic valve was normal in structure. Pulmonic valve regurgitation is not visualized.  Diastology LV e' medial:    7.62 cm/s LV E/e' medial:  9.6 LV e' lateral:   6.09 cm/s LV E/e' lateral: 12.0  AORTIC VALVE LVOT Vmax:   148.00 cm/s LVOT Vmean:  104.000 cm/s LVOT VTI:    0.254 m MITRAL VALVE MV Area (PHT): 2.74 cm    SHUNTS MV Decel Time: 277 msec    Systemic VTI: 0.25 m MV E velocity: 72.80 cm/s MV A velocity: 44.60 cm/s MV E/A ratio:  1.63 Lyman Bishop MD Electronically signed by Lyman Bishop MD Signature Date/Time:  05/02/2020/12:31:57 PM    Final     Scheduled Meds: . (feeding supplement) PROSource Plus  30 mL Oral BID BM  . vitamin C  500 mg Oral Daily  . baricitinib  4 mg Oral Daily  . benzonatate  100 mg Oral TID  . Chlorhexidine Gluconate Cloth  6 each Topical Daily  . feeding supplement ([employer] STANDARD 1.4)  325 mL Oral BID BM  . furosemide  60 mg Intravenous BID  . guaiFENesin  600 mg Oral BID  . HYDROcodone-homatropine  5 mL Oral QID  . insulin aspart  0-20 Units Subcutaneous TID WC  . insulin aspart  0-5 Units Subcutaneous QHS  . insulin aspart  6 Units Subcutaneous TID WC  . insulin detemir  20 Units Subcutaneous BID  . Ipratropium-Albuterol  1 puff  Inhalation QID  . linagliptin  5 mg Oral Daily  . losartan  50 mg Oral Daily  . mouth rinse  15 mL Mouth Rinse BID  . methylPREDNISolone (SOLU-MEDROL) injection  110 mg Intravenous Q12H  . montelukast  10 mg Oral QHS  . Rivaroxaban  15 mg Oral BID WC   Followed by  . [START ON 05/22/2020] rivaroxaban  20 mg Oral Q supper  . senna-docusate  2 tablet Oral BID  . sertraline  100 mg Oral Daily  . zinc sulfate  220 mg Oral Daily   Continuous Infusions:   LOS: 9 days   Time spent: 25 minutes.  Patrecia Pour, MD [facility] [email or web address] 05/03/2020, 6:27 PM

## 2020-05-04 LAB — CBC WITH DIFFERENTIAL/PLATELET
Abs Immature Granulocytes: 0.7 10*3/uL — ABNORMAL HIGH (ref 0.00–0.07)
Basophils Absolute: 0.1 10*3/uL (ref 0.0–0.1)
Basophils Relative: 0 %
Eosinophils Absolute: 0.1 10*3/uL (ref 0.0–0.5)
Eosinophils Relative: 0 %
HCT: 44.7 % (ref 39.0–52.0)
Hemoglobin: 14.1 g/dL (ref 13.0–17.0)
Immature Granulocytes: 2 %
Lymphocytes Relative: 3 %
Lymphs Abs: 0.8 10*3/uL (ref 0.7–4.0)
MCH: 29.5 pg (ref 26.0–34.0)
MCHC: 31.5 g/dL (ref 30.0–36.0)
MCV: 93.5 fL (ref 80.0–100.0)
Monocytes Absolute: 1.3 10*3/uL — ABNORMAL HIGH (ref 0.1–1.0)
Monocytes Relative: 4 %
Neutro Abs: 26 10*3/uL — ABNORMAL HIGH (ref 1.7–7.7)
Neutrophils Relative %: 91 %
Platelets: 359 10*3/uL (ref 150–400)
RBC: 4.78 MIL/uL (ref 4.22–5.81)
RDW: 13.2 % (ref 11.5–15.5)
WBC: 28.9 10*3/uL — ABNORMAL HIGH (ref 4.0–10.5)
nRBC: 0 % (ref 0.0–0.2)

## 2020-05-04 LAB — COMPREHENSIVE METABOLIC PANEL
ALT: 26 U/L (ref 0–44)
AST: 29 U/L (ref 15–41)
Albumin: 2.6 g/dL — ABNORMAL LOW (ref 3.5–5.0)
Alkaline Phosphatase: 68 U/L (ref 38–126)
Anion gap: 13 (ref 5–15)
BUN: 51 mg/dL — ABNORMAL HIGH (ref 8–23)
CO2: 25 mmol/L (ref 22–32)
Calcium: 8 mg/dL — ABNORMAL LOW (ref 8.9–10.3)
Chloride: 92 mmol/L — ABNORMAL LOW (ref 98–111)
Creatinine, Ser: 1.05 mg/dL (ref 0.61–1.24)
GFR calc Af Amer: >60 mL/min (ref >60)
GFR calc non Af Amer: >60 mL/min (ref >60)
Glucose, Bld: 302 mg/dL — ABNORMAL HIGH (ref 70–99)
Potassium: 4.4 mmol/L (ref 3.5–5.1)
Sodium: 130 mmol/L — ABNORMAL LOW (ref 135–145)
Total Bilirubin: 0.8 mg/dL (ref 0.3–1.2)
Total Protein: 6.1 g/dL — ABNORMAL LOW (ref 6.5–8.1)

## 2020-05-04 LAB — GLUCOSE, CAPILLARY
Glucose-Capillary: 168 mg/dL — ABNORMAL HIGH (ref 70–99)
Glucose-Capillary: 295 mg/dL — ABNORMAL HIGH (ref 70–99)
Glucose-Capillary: 226 mg/dL — ABNORMAL HIGH (ref 70–99)
Glucose-Capillary: 165 mg/dL — ABNORMAL HIGH (ref 70–99)

## 2020-05-04 LAB — D-DIMER, QUANTITATIVE: D-Dimer, Quant: 9.32 ug/mL-FEU — ABNORMAL HIGH (ref 0.00–0.50)

## 2020-05-04 LAB — MAGNESIUM: Magnesium: 2.7 mg/dL — ABNORMAL HIGH (ref 1.7–2.4)

## 2020-05-04 LAB — C-REACTIVE PROTEIN: CRP: 2.1 mg/dL — ABNORMAL HIGH (ref <1.0)

## 2020-05-04 MED ORDER — SALINE SPRAY 0.65 % NA SOLN
1.0000 | NASAL | Status: DC | PRN
  Administered 2020-05-22: 1 via NASAL
  Filled 2020-05-04: qty 44

## 2020-05-04 MED ORDER — METHYLPREDNISOLONE SODIUM SUCC 125 MG IJ SOLR
60.0000 mg | Freq: Two times a day (BID) | INTRAMUSCULAR | Status: DC
  Administered 2020-05-04 – 2020-05-08 (×8): 60 mg via INTRAVENOUS
  Filled 2020-05-04 (×8): qty 2

## 2020-05-04 MED ORDER — OXYMETAZOLINE HCL 0.05 % NA SOLN
1.0000 | Freq: Two times a day (BID) | NASAL | Status: DC | PRN
  Filled 2020-05-04: qty 30

## 2020-05-04 NOTE — Progress Notes (Signed)
Nutrition Follow-up  RD working remotely.  DOCUMENTATION CODES:   Obesity unspecified  INTERVENTION:  - continue [employer] 1.4 po BID and Prosource Plus BID.   NUTRITION DIAGNOSIS:   Increased nutrient needs related to acute illness, catabolic illness ([id number] infection) as evidenced by estimated needs -ongoing  GOAL:   Patient will meet greater than or equal to 90% of their needs -minimally met on average   MONITOR:   PO intake, Supplement acceptance, Labs, Weight trends  ASSESSMENT:   64 y.o. male with medical history of asthma, obesity, sleep apnea, and type 2 DM. He was admitted with severe shortness of breath, cough, and fevers of 102-104 at home. His symptoms began on 9/13. His wife and mother-in-law also presented at the same time as him for similar symptoms. He is not vaccinated against COVID-19. He was found to be COVID positive in the ED.  He has been eating mainly 50-100% of meals over the past 1 week and has been accepting oral nutrition supplements 75-90% of the time offered.   He has not been weighed since 9/20. Edema section in the flow sheet indicates no edema was present this AM.   Per notes: - COVID-19 infection with ARDS this admission - OSA - type 2 DM with HgbA1c: 8.4% - MD note from 9/29 indicated admission for another >3 days    Labs reviewed; CBG: 168 mg/dl, Na: 130 mmol/l, Cl: 92 mmol/l, BUN: 51 mg/dl, Ca: 8 mg/dl, Mg: 2.7 mg/dl. Medications reviewed; 500 mg ascorbic acid/day, sliding scale novolog, 6 units novolog TID, 20 units levemir BID, 5 mg tradjenta/day, 60 mg solu-medrol BID, 2 tablets senokot BID, 220 mg zinc sulfate/day.    Diet Order:   Diet Order            Diet Carb Modified Fluid consistency: Thin; Room service appropriate? Yes  Diet effective now                 EDUCATION NEEDS:   No education needs have been identified at this time  Skin:  Skin Assessment: Reviewed RN Assessment  Last BM:  9/29 (type 2)  Height:    Ht Readings from Last 1 Encounters:  04/24/20 $RemoveB'5\' 9"'[id number]$  (1.753 m)    Weight:   Wt Readings from Last 1 Encounters:  04/24/20 108.4 kg     Estimated Nutritional Needs:  Kcal:  7654-6503 kcal (20-22 kcal/kg adjBW) Protein:  105-115 grams Fluid:  >/= 2 L/day      Jarome Matin, MS, RD, LDN, CNSC Inpatient Clinical Dietitian RD pager # available in AMION  After hours/weekend pager # available in [facility]

## 2020-05-04 NOTE — Progress Notes (Signed)
PROGRESS NOTE  Clayton [name]  ZOX:[id number] DOB: October 23, 1955 DOA: 04/24/2020 PCP: Clayton Loader, FNP   Brief Narrative: Clayton [name] is a 64 y.o. male with a history of depression, asthma, OSA not on CPAP, type II DM, obesity. Presents with complaints of cough and shortness of breath. Found to have COVID-19 pneumonia. Also found to have bilateral DVT. He has been treated in the ICU/SDU with heated high flow due to persistent and severe hypoxemia.   Assessment & Plan: Active Problems:   Acute hypoxemic respiratory failure due to COVID-19 (HCC)   Hypokalemia   Hyperglycemia   Uncontrolled type 2 diabetes mellitus with hyperglycemia (HCC)  Acute hypoxic respiratory failure, ARDS and sepsis secondary to COVID-19 pneumonia POA. Sepsis has resolved. - Continues to require heated high flow oxygen, remain in ICU/SDU. Appreciate PCCM involvement as he is at risk of deterioration and is full code.  - S/p remdesivir.   - Continue baricitinib (9/20 - 10/3) - CRP sustaining improvement, has gotten 10 days high-dose steroids which we will taper.  - Continue lasix only on prn basis. BUN:Cr ~50:1. G1DD, preserved LVEF on limited echo 9/28.  - Antitussives  OSA: Not on CPAP  Uncontrolled T2DM with hyperglycemia: HbA1c 8.4%.  - Control improving, continue SSI, insulin detemir, and linagliptin.   Acute bilateral lower extremity DVTs: - Continue xarelto (s/p lovenox)  Hypokalemia, hyponatremia - Monitoring  Depression, anxiety:  - Continue home regimen. Continue prn ativan and morphine  Essential hypertension - Continue losartan. Hold HCTZ while giving lasix.  Obesity: Body mass index is 35.29 kg/m.   Hyperkalemia: Resolved.   DVT prophylaxis: Xarelto Code Status: Full Family Communication: None at bedside, pt is oriented and did not request a call.  Disposition Plan:  Status is: Inpatient  Remains inpatient appropriate because:Inpatient level of care appropriate due to  severity of illness   Dispo: The patient is from: Home              Anticipated d/c is to: TBD              Anticipated d/c date is: > 3 days              Patient currently is not medically stable to d/c.  Consultants:   PCCM  Procedures:   None  Antimicrobials:  Remdesivir   Subjective: Feels marginally better today than yesterday, specifically able to move more before dyspnea becomes severe. Desaturates rapidly into 70%'s when takes of NRB mask despite still being on heated high flow oxygen.  Objective: Vitals:   05/04/20 0832 05/04/20 0900 05/04/20 1200 05/04/20 1404  BP:  112/74 (!) 98/54   Pulse: 64 64 92 79  Resp: 14 17 (!) 34 (!) 22  Temp:   98 F (36.7 C)   TempSrc:   Axillary   SpO2: 94% 94% (!) 80% (!) 85%  Weight:      Height:        Intake/Output Summary (Last 24 hours) at 05/04/2020 1534 Last data filed at 05/04/2020 1200 Gross per 24 hour  Intake 300 ml  Output 1150 ml  Net -850 ml   Filed Weights   04/24/20 1400 04/24/20 1905  Weight: 111.1 kg 108.4 kg   Gen: 64 y.o. male in no distress Pulm: Mildly labored when speaking, tachypneic, diminished with crackles, no wheezes. CV: Regular rate and rhythm. No murmur, rub, or gallop. No JVD, no pitting dependent edema. GI: Abdomen soft, non-tender, non-distended, with normoactive bowel sounds.  Ext: Warm, no  deformities Skin: No rashes, lesions or ulcers on visualized skin. Neuro: Alert and oriented. No focal neurological deficits. Psych: Judgement and insight appear fair. Mood euthymic & affect congruent. Behavior is appropriate.    Data Reviewed: I have personally reviewed following labs and imaging studies  CBC: Recent Labs  Lab 04/30/20 0347 05/01/20 0327 05/02/20 0257 05/03/20 0247 05/04/20 0305  WBC 28.3* 24.3* 27.3* 26.3* 28.9*  NEUTROABS 25.9* 21.7* 24.4* 24.1* 26.0*  HGB 14.3 13.7 14.4 14.6 14.1  HCT 44.3 43.0 44.5 44.9 44.7  MCV 93.1 93.7 93.5 93.0 93.5  PLT 453* 467* 485* 465*  229   Basic Metabolic Panel: Recent Labs  Lab 04/29/20 0305 04/29/20 0305 04/30/20 0347 05/01/20 0327 05/02/20 0257 05/03/20 0247 05/04/20 0305  NA 141   < > 137 138 139 136 130*  K 4.6   < > 4.6 4.8 5.2* 5.0 4.4  CL 101   < > 99 98 101 95* 92*  CO2 29   < > 28 27 27 28 25   GLUCOSE 132*   < > 199* 244* 199* 333* 302*  BUN 24*   < > 29* 36* 31* 53* 51*  CREATININE 0.75   < > 0.94 1.06 0.80 1.30* 1.05  CALCIUM 8.4*   < > 8.1* 8.3* 8.4* 8.4* 8.0*  MG 2.7*  --   --  2.8* 2.8* 2.6* 2.7*   < > = values in this interval not displayed.   GFR: Estimated Creatinine Clearance: 88.5 mL/min (by C-G formula based on SCr of 1.05 mg/dL). Liver Function Tests: Recent Labs  Lab 04/30/20 0347 05/01/20 0327 05/02/20 0257 05/03/20 0247 05/04/20 0305  AST 28 23 28 28 29   ALT 24 20 22 25 26   ALKPHOS 75 65 70 71 68  BILITOT 0.8 0.5 0.6 0.5 0.8  PROT 6.9 6.3* 6.8 6.6 6.1*  ALBUMIN 2.8* 2.5* 2.7* 2.7* 2.6*   No results for input(s): LIPASE, AMYLASE in the last 168 hours. No results for input(s): AMMONIA in the last 168 hours. Coagulation Profile: No results for input(s): INR, PROTIME in the last 168 hours. Cardiac Enzymes: No results for input(s): CKTOTAL, CKMB, CKMBINDEX, TROPONINI in the last 168 hours. BNP (last 3 results) No results for input(s): PROBNP in the last 8760 hours. HbA1C: No results for input(s): HGBA1C in the last 72 hours. CBG: Recent Labs  Lab 05/03/20 1216 05/03/20 1628 05/03/20 2125 05/04/20 0733 05/04/20 1151  GLUCAP 140* 154* 132* 168* 295*   Lipid Profile: No results for input(s): CHOL, HDL, LDLCALC, TRIG, CHOLHDL, LDLDIRECT in the last 72 hours. Thyroid Function Tests: No results for input(s): TSH, T4TOTAL, FREET4, T3FREE, THYROIDAB in the last 72 hours. Anemia Panel: No results for input(s): VITAMINB12, FOLATE, FERRITIN, TIBC, IRON, RETICCTPCT in the last 72 hours. Urine analysis:    Component Value Date/Time   COLORURINE YELLOW 12/22/2007 1120    APPEARANCEUR CLEAR 12/22/2007 1120   LABSPEC 1.011 12/22/2007 1120   PHURINE 6.0 12/22/2007 1120   GLUCOSEU 100 (A) 12/22/2007 1120   HGBUR MODERATE (A) 12/22/2007 1120   HGBUR negative 12/21/2007 0000   BILIRUBINUR NEGATIVE 12/22/2007 1120   KETONESUR NEGATIVE 12/22/2007 1120   PROTEINUR NEGATIVE 12/22/2007 1120   UROBILINOGEN 0.2 12/22/2007 1120   NITRITE NEGATIVE 12/22/2007 1120   LEUKOCYTESUR NEGATIVE 12/22/2007 1120   Recent Results (from the past 240 hour(s))  MRSA PCR Screening     Status: None   Collection Time: 04/24/20  5:00 PM   Specimen: Nasopharyngeal  Result Value Ref Range  Status   MRSA by PCR NEGATIVE NEGATIVE Final    Comment:        The GeneXpert MRSA Assay (FDA approved for NASAL specimens only), is one component of a comprehensive MRSA colonization surveillance program. It is not intended to diagnose MRSA infection nor to guide or monitor treatment for MRSA infections. Performed at [facility], [place] [address], [place], [place] [address]       Radiology Studies: No results found.  Scheduled Meds: . (feeding supplement) PROSource Plus  30 mL Oral BID BM  . vitamin C  500 mg Oral Daily  . baricitinib  4 mg Oral Daily  . benzonatate  100 mg Oral TID  . Chlorhexidine Gluconate Cloth  6 each Topical Daily  . feeding supplement (KATE FARMS STANDARD 1.4)  325 mL Oral BID BM  . guaiFENesin  600 mg Oral BID  . HYDROcodone-homatropine  5 mL Oral QID  . insulin aspart  0-20 Units Subcutaneous TID WC  . insulin aspart  0-5 Units Subcutaneous QHS  . insulin aspart  6 Units Subcutaneous TID WC  . insulin detemir  20 Units Subcutaneous BID  . Ipratropium-Albuterol  1 puff Inhalation QID  . linagliptin  5 mg Oral Daily  . losartan  50 mg Oral Daily  . mouth rinse  15 mL Mouth Rinse BID  . methylPREDNISolone (SOLU-MEDROL) injection  60 mg Intravenous Q12H  . montelukast  10 mg Oral QHS  . Rivaroxaban  15 mg Oral BID WC   Followed by  .  [START ON 05/22/2020] rivaroxaban  20 mg Oral Q supper  . senna-docusate  2 tablet Oral BID  . sertraline  100 mg Oral Daily  . zinc sulfate  220 mg Oral Daily   Continuous Infusions:   LOS: 10 days   Time spent: 25 minutes.  Patrecia Pour, MD [facility] [email or web address] 05/04/2020, 3:34 PM

## 2020-05-04 NOTE — Progress Notes (Signed)
   NAME:  Clayton [name], MRN:  [id number], DOB:  09-Jun-1956, LOS: 48 ADMISSION DATE:  04/24/2020, CONSULTATION DATE:  05/03/2020 REFERRING MD:  Dr Posey Pronto, CHIEF COMPLAINT:  Acute hypoxic respiratory failure  Brief History   Asked to see patient for hypoxic respiratory failure Patient being treated for Covid pneumonia, bilateral DVT Increasing oxygen requirement Past Medical History   Past Medical History:  Diagnosis Date  . Arthritis   . Asthma    pulmonary allergies- no asthma per pt  . Atrophic condition of skin   . Cellulitis/abscess - trunk   . Cyst    shoulder  . Hypertrophic condition of skin   . Lipoma   . Obesity   . Pneumonia    hx child  . Sleep apnea    no cpap  . Stones in the urinary tract   . Vitamin D deficiency    Significant Hospital Events   Chest x-ray 9/28: Diffuse bilateral pulmonary infiltrates/edema  Consults:  pccm 9/28  Procedures:  None  Significant Diagnostic Tests:  Chest x-ray 9/28  Echocardiogram 9/28-diastolic dysfunction Vascular ultrasound lower extremity 9/24 showing DVTs Micro Data:  9/20 blood cultures negative  Antimicrobials:  Remdesivir 9/22 9/25 Baricitinib 9/20>>  Interim history/subjective:  No distress. Appears comfortable but still rapildy desaturates to 70s when he removes his mask  Objective   Blood pressure 109/63, pulse 64, temperature 97.6 F (36.4 C), temperature source Oral, resp. rate 14, height 5\' 9"  (1.753 m), weight 108.4 kg, SpO2 94 %.    FiO2 (%):  [100 %] 100 %   Intake/Output Summary (Last 24 hours) at 05/04/2020 0857 Last data filed at 05/04/2020 0545 Gross per 24 hour  Intake 780 ml  Output 2050 ml  Net -1270 ml   Filed Weights   04/24/20 1400 04/24/20 1905  Weight: 111.1 kg 108.4 kg    Examination: General this is an obese 64 year old white male. He is resting in bed. He is not currently in distress HENT NCAT no JVD MMM pulm dec bilaterally. Currently sats 92% on heated high flow 50 lpm  100% AND NRB with 1 of 3 flaps off and really only intermittently placing mask over face.  Card RRR abd not tender  Ext warm and dry  Neuro intact   Resolved Hospital Problem list     Assessment & Plan:  Acute hypoxic respiratory failure 2/2 covid 19 pneumonia and ARDS Plan Cont supplemental oxygen accepting sats > 85% Encourage self proning Day 11 Baricitinib (complete 14d) Day 10 high dose steroids->not really helped will start taper Cont diuretics as able  Diastolic dysfunction Plan Tele  Cozaar Diuretics  Obstructive sleep apnea -does not tolerate NIPPV Plan  pulse ox and oxygen all we can offer   DVT Plan Cont full st AC  Type II DM w/ hyperglycemia Plan ssi   Morbid obesity  Plan Needs wt loss if survives    Best practice:  Diet: Per primary Glucose control: SSI Mobility: Bedrest Code Status: Full code Disposition: Stepdown still high risk he will need intubation   Erick Colace ACNP-BC [place] Pager # [phone] OR # [phone] if no answer

## 2020-05-05 DIAGNOSIS — E669 Obesity, unspecified: Secondary | ICD-10-CM

## 2020-05-05 LAB — GLUCOSE, CAPILLARY
Glucose-Capillary: 217 mg/dL — ABNORMAL HIGH (ref 70–99)
Glucose-Capillary: 188 mg/dL — ABNORMAL HIGH (ref 70–99)
Glucose-Capillary: 155 mg/dL — ABNORMAL HIGH (ref 70–99)
Glucose-Capillary: 158 mg/dL — ABNORMAL HIGH (ref 70–99)

## 2020-05-05 LAB — COMPREHENSIVE METABOLIC PANEL
ALT: 29 U/L (ref 0–44)
AST: 28 U/L (ref 15–41)
Albumin: 2.8 g/dL — ABNORMAL LOW (ref 3.5–5.0)
Alkaline Phosphatase: 81 U/L (ref 38–126)
Anion gap: 12 (ref 5–15)
BUN: 48 mg/dL — ABNORMAL HIGH (ref 8–23)
CO2: 29 mmol/L (ref 22–32)
Calcium: 8.2 mg/dL — ABNORMAL LOW (ref 8.9–10.3)
Chloride: 94 mmol/L — ABNORMAL LOW (ref 98–111)
Creatinine, Ser: 0.96 mg/dL (ref 0.61–1.24)
GFR calc Af Amer: >60 mL/min (ref >60)
GFR calc non Af Amer: >60 mL/min (ref >60)
Glucose, Bld: 191 mg/dL — ABNORMAL HIGH (ref 70–99)
Potassium: 4.4 mmol/L (ref 3.5–5.1)
Sodium: 135 mmol/L (ref 135–145)
Total Bilirubin: 0.7 mg/dL (ref 0.3–1.2)
Total Protein: 6.4 g/dL — ABNORMAL LOW (ref 6.5–8.1)

## 2020-05-05 LAB — BLOOD GAS, ARTERIAL
Acid-Base Excess: 5.9 mmol/L — ABNORMAL HIGH (ref 0.0–2.0)
Bicarbonate: 30.6 mmol/L — ABNORMAL HIGH (ref 20.0–28.0)
Drawn by: 270211
FIO2: 100
O2 Saturation: 96.4 %
Patient temperature: 98.6
pCO2 arterial: 45.7 mmHg (ref 32.0–48.0)
pH, Arterial: 7.44 (ref 7.350–7.450)
pO2, Arterial: 88.1 mmHg (ref 83.0–108.0)

## 2020-05-05 LAB — PROCALCITONIN: Procalcitonin: <0.1 ng/mL

## 2020-05-05 LAB — C-REACTIVE PROTEIN: CRP: 1.4 mg/dL — ABNORMAL HIGH (ref <1.0)

## 2020-05-05 MED ORDER — MAGIC MOUTHWASH W/LIDOCAINE
10.0000 mL | Freq: Four times a day (QID) | ORAL | Status: DC | PRN
  Filled 2020-05-05 (×2): qty 10

## 2020-05-05 MED ORDER — FUROSEMIDE 10 MG/ML IJ SOLN
40.0000 mg | Freq: Two times a day (BID) | INTRAMUSCULAR | Status: DC
  Administered 2020-05-05 – 2020-05-08 (×7): 40 mg via INTRAVENOUS
  Filled 2020-05-05 (×7): qty 4

## 2020-05-05 NOTE — Progress Notes (Signed)
Inpatient Diabetes Program Recommendations  AACE/ADA: New Consensus Statement on Inpatient Glycemic Control (2015)  Target Ranges:  Prepandial:   less than 140 mg/dL      Peak postprandial:   less than 180 mg/dL (1-2 hours)      Critically ill patients:  140 - 180 mg/dL   Lab Results  Component Value Date   GLUCAP 217 (H) 05/05/2020   HGBA1C 8.4 (H) 04/25/2020    Review of Glycemic Control  Current orders for Inpatient glycemic control: Levemir 20 units bid, Novolog 0-20 units tidwc and hs + 6 units tidwc, tradjenta 5 mg QD  On Solumedrol 60 mg Q12H  Inpatient Diabetes Program Recommendations:     Increase Levemir to 22 units bid Increase Novolog to 8 units tidwc for meal coverage insulin if eating > 50% meal  HgbA1C - 8.4%. Will need lifestyle modification, weight loss and exercise, along with DM meds to control blood sugars after discharge.   Continue to follow glucose trends.   Thank you. Lorenda Peck, RD, LDN, CDE Inpatient Diabetes Coordinator [phone]

## 2020-05-05 NOTE — Progress Notes (Signed)
PROGRESS NOTE  [name]  WIO:[id number] DOB: [birth year] DOA: 04/24/2020 PCP: Kristen Loader, FNP   Brief Narrative: Clayton [name] is a 64 y.o. male with a history of depression, asthma, OSA not on CPAP, type II DM, obesity. Presents with complaints of cough and shortness of breath. Found to have COVID-19 pneumonia. Also found to have bilateral DVT. He has been treated in the ICU/SDU with heated high flow due to persistent and severe hypoxemia.   Patient continues to have significant hypoxia, currently on 55 L heated high flow.  Working on trying to wean down his oxygen but it is slow.  Patient gets easily winded, feels about the same, fatigue.  Has begun steroid taper.  Procalcitonin and CRP are within normal limits.  Assessment & Plan: Active Problems:   Acute hypoxemic respiratory failure due to COVID-19 (HCC)   Hypokalemia   Hyperglycemia   Uncontrolled type 2 diabetes mellitus with hyperglycemia (HCC)  Acute hypoxic respiratory failure, ARDS and sepsis secondary to COVID-19 pneumonia POA. Sepsis has resolved. - Continues to require heated high flow oxygen, remain in ICU/SDU. Appreciate PCCM involvement as he is at risk of deterioration and is full code.  - S/p remdesivir.   - Continue baricitinib (9/20 - 10/3) - CRP sustaining improvement, has gotten 10 days high-dose steroids which we will taper.  - Continue lasix only on prn basis. BUN:Cr ~50:1. G1DD, preserved LVEF on limited echo 9/28.  - Antitussives Will check ABG.  Obesity: Patient meets criteria for BMI greater than 30  OSA: Not on CPAP  Uncontrolled T2DM with hyperglycemia: HbA1c 8.4%.  CBGs have been relatively stable. - Control improving, continue SSI, insulin detemir, and linagliptin.   Acute bilateral lower extremity DVTs: - Continue xarelto (s/p lovenox)  Hypokalemia, hyponatremia - Monitoring  Depression, anxiety:  - Continue home regimen. Continue prn ativan and morphine  Essential  hypertension - Continue losartan. Hold HCTZ while giving lasix.  Obesity: Body mass index is 32.65 kg/m.   Hyperkalemia: Resolved.   DVT prophylaxis: Xarelto Code Status: Full Family Communication: Left message for wife Disposition Plan:  Status is: Inpatient  Remains inpatient appropriate because:Inpatient level of care appropriate due to severity of illness   Dispo: The patient is from: Home              Anticipated d/c is to: TBD              Anticipated d/c date is: > 3 days              Patient currently is not medically stable to d/c, remains severely hypoxic.  Consultants:   PCCM  Procedures:   None  Antimicrobials:  Remdesivir     Objective: Vitals:   05/05/20 1000 05/05/20 1100 05/05/20 1200 05/05/20 1239  BP: 130/74 137/79  (!) 98/58  Pulse: 75 64 70   Resp: (!) 25 20 (!) 22   Temp:   (!) 97 F (36.1 C)   TempSrc:   Axillary   SpO2: (!) 89% 96% 96%   Weight:      Height:        Intake/Output Summary (Last 24 hours) at 05/05/2020 1329 Last data filed at 05/05/2020 0641 Gross per 24 hour  Intake --  Output 1450 ml  Net -1450 ml   Filed Weights   04/24/20 1400 04/24/20 1905 05/05/20 0500  Weight: 111.1 kg 108.4 kg 100.3 kg   Gen: Alert and oriented x3, no acute distress Pulm: Tachypneic, mildly labored breathing, decreased  breath sounds throughout CV: Regular rate and rhythm, S1-S2 GI: Soft, nontender, nondistended, positive bowel sounds Ext: No clubbing or cyanosis or edema Skin: No skin breaks, tears or lesions Neuro: No focal deficits Psych: Appropriate, no evidence of psychoses  Data Reviewed: I have personally reviewed following labs and imaging studies  CBC: Recent Labs  Lab 04/30/20 0347 05/01/20 0327 05/02/20 0257 05/03/20 0247 05/04/20 0305  WBC 28.3* 24.3* 27.3* 26.3* 28.9*  NEUTROABS 25.9* 21.7* 24.4* 24.1* 26.0*  HGB 14.3 13.7 14.4 14.6 14.1  HCT 44.3 43.0 44.5 44.9 44.7  MCV 93.1 93.7 93.5 93.0 93.5  PLT 453* 467*  485* 465* 024   Basic Metabolic Panel: Recent Labs  Lab 04/29/20 0305 04/30/20 0347 05/01/20 0327 05/02/20 0257 05/03/20 0247 05/04/20 0305 05/05/20 0247  NA 141   < > 138 139 136 130* 135  K 4.6   < > 4.8 5.2* 5.0 4.4 4.4  CL 101   < > 98 101 95* 92* 94*  CO2 29   < > 27 27 28 25 29   GLUCOSE 132*   < > 244* 199* 333* 302* 191*  BUN 24*   < > 36* 31* 53* 51* 48*  CREATININE 0.75   < > 1.06 0.80 1.30* 1.05 0.96  CALCIUM 8.4*   < > 8.3* 8.4* 8.4* 8.0* 8.2*  MG 2.7*  --  2.8* 2.8* 2.6* 2.7*  --    < > = values in this interval not displayed.   GFR: Estimated Creatinine Clearance: 93.1 mL/min (by C-G formula based on SCr of 0.96 mg/dL). Liver Function Tests: Recent Labs  Lab 05/01/20 0327 05/02/20 0257 05/03/20 0247 05/04/20 0305 05/05/20 0247  AST 23 28 28 29 28   ALT 20 22 25 26 29   ALKPHOS 65 70 71 68 81  BILITOT 0.5 0.6 0.5 0.8 0.7  PROT 6.3* 6.8 6.6 6.1* 6.4*  ALBUMIN 2.5* 2.7* 2.7* 2.6* 2.8*   No results for input(s): LIPASE, AMYLASE in the last 168 hours. No results for input(s): AMMONIA in the last 168 hours. Coagulation Profile: No results for input(s): INR, PROTIME in the last 168 hours. Cardiac Enzymes: No results for input(s): CKTOTAL, CKMB, CKMBINDEX, TROPONINI in the last 168 hours. BNP (last 3 results) No results for input(s): PROBNP in the last 8760 hours. HbA1C: No results for input(s): HGBA1C in the last 72 hours. CBG: Recent Labs  Lab 05/04/20 1151 05/04/20 1642 05/04/20 2157 05/05/20 0806 05/05/20 1116  GLUCAP 295* 226* 165* 217* 188*   Lipid Profile: No results for input(s): CHOL, HDL, LDLCALC, TRIG, CHOLHDL, LDLDIRECT in the last 72 hours. Thyroid Function Tests: No results for input(s): TSH, T4TOTAL, FREET4, T3FREE, THYROIDAB in the last 72 hours. Anemia Panel: No results for input(s): VITAMINB12, FOLATE, FERRITIN, TIBC, IRON, RETICCTPCT in the last 72 hours. Urine analysis:    Component Value Date/Time   COLORURINE YELLOW  12/22/2007 1120   APPEARANCEUR CLEAR 12/22/2007 1120   LABSPEC 1.011 12/22/2007 1120   PHURINE 6.0 12/22/2007 1120   GLUCOSEU 100 (A) 12/22/2007 1120   HGBUR MODERATE (A) 12/22/2007 1120   HGBUR negative 12/21/2007 0000   BILIRUBINUR NEGATIVE 12/22/2007 1120   KETONESUR NEGATIVE 12/22/2007 1120   PROTEINUR NEGATIVE 12/22/2007 1120   UROBILINOGEN 0.2 12/22/2007 1120   NITRITE NEGATIVE 12/22/2007 1120   LEUKOCYTESUR NEGATIVE 12/22/2007 1120   No results found for this or any previous visit (from the past 240 hour(s)).    Radiology Studies: No results found.  Scheduled Meds: . (feeding supplement)  PROSource Plus  30 mL Oral BID BM  . vitamin C  500 mg Oral Daily  . baricitinib  4 mg Oral Daily  . benzonatate  100 mg Oral TID  . Chlorhexidine Gluconate Cloth  6 each Topical Daily  . feeding supplement (KATE FARMS STANDARD 1.4)  325 mL Oral BID BM  . furosemide  40 mg Intravenous Q12H  . guaiFENesin  600 mg Oral BID  . HYDROcodone-homatropine  5 mL Oral QID  . insulin aspart  0-20 Units Subcutaneous TID WC  . insulin aspart  0-5 Units Subcutaneous QHS  . insulin aspart  6 Units Subcutaneous TID WC  . insulin detemir  20 Units Subcutaneous BID  . Ipratropium-Albuterol  1 puff Inhalation QID  . linagliptin  5 mg Oral Daily  . losartan  50 mg Oral Daily  . mouth rinse  15 mL Mouth Rinse BID  . methylPREDNISolone (SOLU-MEDROL) injection  60 mg Intravenous Q12H  . montelukast  10 mg Oral QHS  . Rivaroxaban  15 mg Oral BID WC   Followed by  . [START ON 05/22/2020] rivaroxaban  20 mg Oral Q supper  . senna-docusate  2 tablet Oral BID  . sertraline  100 mg Oral Daily  . zinc sulfate  220 mg Oral Daily   Continuous Infusions:   LOS: 11 days   Time spent: 25 minutes.  Annita Brod, MD [facility] [email or web address] 05/05/2020, 1:29 PM

## 2020-05-05 NOTE — Progress Notes (Signed)
   NAME:  Clayton [name], MRN:  [id number], DOB:  [birth year], LOS: 46 ADMISSION DATE:  04/24/2020, CONSULTATION DATE:  05/03/2020 REFERRING MD:  Dr Posey Pronto, CHIEF COMPLAINT:  Acute hypoxic respiratory failure  Brief History   Asked to see patient for hypoxic respiratory failure Patient being treated for Covid pneumonia, bilateral DVT Increasing oxygen requirement Past Medical History   Past Medical History:  Diagnosis Date  . Arthritis   . Asthma    pulmonary allergies- no asthma per pt  . Atrophic condition of skin   . Cellulitis/abscess - trunk   . Cyst    shoulder  . Hypertrophic condition of skin   . Lipoma   . Obesity   . Pneumonia    hx child  . Sleep apnea    no cpap  . Stones in the urinary tract   . Vitamin D deficiency    Significant Hospital Events   Chest x-ray 9/28: Diffuse bilateral pulmonary infiltrates/edema  Consults:  pccm 9/28  Procedures:  None  Significant Diagnostic Tests:  Chest x-ray 9/28  Echocardiogram 9/28-diastolic dysfunction Vascular ultrasound lower extremity 9/24 showing DVTs Micro Data:  9/20 blood cultures negative  Antimicrobials:  Remdesivir 9/22 9/25 Baricitinib 9/20>>  Interim history/subjective:  he's frustrated but holding his own   Objective   Blood pressure (Abnormal) 142/62, pulse 62, temperature 97.8 F (36.6 C), temperature source Axillary, resp. rate 15, height 5\' 9"  (1.753 m), weight 100.3 kg, SpO2 92 %.    FiO2 (%):  [100 %] 100 %   Intake/Output Summary (Last 24 hours) at 05/05/2020 0904 Last data filed at 05/05/2020 0641 Gross per 24 hour  Intake no documentation  Output 1850 ml  Net -1850 ml   Filed Weights   04/24/20 1400 04/24/20 1905 05/05/20 0500  Weight: 111.1 kg 108.4 kg 100.3 kg    Examination:  General this is a 64 year old white male he is resting in bed. No distress hent NCAT no JVD MMM pulm decreased t/o still rapidly desaturates currently on 50 lpm and 100% heated high flow w/ NRG Card  RRR abd not tender soft not tender Ext warm and dry  Neuro intact   Resolved Hospital Problem list     Assessment & Plan:  Acute hypoxic respiratory failure 2/2 covid 19 pneumonia and ARDS Plan Cont supplemental oxygen  Accept sats >85% Encourage self proning Day 12 Baricitinib Slow taper steroids (yesterday was day 10) Cont daily assessment for diuretics    Diastolic dysfunction Plan Tele, cozaar and diuretics as will tolerate   Obstructive sleep apnea -does not tolerate NIPPV Plan Pulse ox and supplemental oxygen   DM w/ hyperglycemia Plan ssi   DVT Plan Cont AC  Type II DM w/ hyperglycemia Plan ssi   Morbid obesity  Plan Needs wt loss if survives     Best practice:  Diet: Per primary Glucose control: SSI Mobility: Bedrest Code Status: Full code Disposition: Stepdown still high risk he will need intubation   Erick Colace ACNP-BC [place] Pager # [phone] OR # [phone] if no answer

## 2020-05-06 DIAGNOSIS — R739 Hyperglycemia, unspecified: Secondary | ICD-10-CM | POA: Diagnosis not present

## 2020-05-06 LAB — GLUCOSE, CAPILLARY
Glucose-Capillary: 115 mg/dL — ABNORMAL HIGH (ref 70–99)
Glucose-Capillary: 224 mg/dL — ABNORMAL HIGH (ref 70–99)
Glucose-Capillary: 198 mg/dL — ABNORMAL HIGH (ref 70–99)
Glucose-Capillary: 187 mg/dL — ABNORMAL HIGH (ref 70–99)

## 2020-05-06 LAB — C-REACTIVE PROTEIN: CRP: 1 mg/dL — ABNORMAL HIGH (ref <1.0)

## 2020-05-06 MED ORDER — MAGIC MOUTHWASH W/LIDOCAINE
10.0000 mL | Freq: Four times a day (QID) | ORAL | Status: DC
  Administered 2020-05-06 – 2020-05-29 (×83): 10 mL via ORAL
  Filled 2020-05-06 (×99): qty 10

## 2020-05-06 NOTE — Progress Notes (Signed)
PROGRESS NOTE  [name]  LFY:[id number] DOB: [birth year] DOA: 04/24/2020 PCP: Kristen Loader, FNP   Brief Narrative: Griff Badley is a 64 y.o. male with a history of depression, asthma, OSA not on CPAP, type II DM, obesity. Presents with complaints of cough and shortness of breath. Found to have COVID-19 pneumonia. Also found to have bilateral DVT. He has been treated in the ICU/SDU with heated high flow due to persistent and severe hypoxemia.   Patient continues to have significant hypoxia, has been on 55 L heated high flow.  Finally seems to be improving somewhat, able to wean his oxygenation down to 45 L/min.    Patient gets easily winded, feels about the same, fatigue.  Has begun steroid taper.  Procalcitonin and CRP are within normal limits.  Also complains of some mouth ulcers  Assessment & Plan: Active Problems:   Acute hypoxemic respiratory failure due to COVID-19 (HCC)   Hypokalemia   Hyperglycemia   Uncontrolled type 2 diabetes mellitus with hyperglycemia (HCC)  Acute hypoxic respiratory failure, ARDS and sepsis secondary to COVID-19 pneumonia POA. Sepsis has resolved. - Continues to require heated high flow oxygen, remain in ICU/SDU. Appreciate PCCM involvement as he is at risk of deterioration and is full code.  - S/p remdesivir.   - Continue baricitinib (9/20 - 10/3) - CRP sustaining improvement, has gotten 10 days high-dose steroids which we will taper.  - Continue lasix only on prn basis. BUN:Cr ~50:1. G1DD, preserved LVEF on limited echo 9/28.  - Antitussives ABG just noted hypoxia.  Seems to finally be starting to improve, hopefully will continue to do so.  Oral ulcers, may be secondary to dry mouth from continued oxygenation mask.  Started Magic mouthwash as needed on 10/1, changed to scheduled.  Obesity: Patient meets criteria for BMI greater than 30  OSA: Not on CPAP  Uncontrolled T2DM with hyperglycemia: HbA1c 8.4%.  CBGs have been relatively stable. -  Control improving, continue SSI, insulin detemir, and linagliptin.   Acute bilateral lower extremity DVTs: - Continue xarelto (s/p lovenox)  Hypokalemia, hyponatremia - Monitoring  Depression, anxiety:  - Continue home regimen. Continue prn ativan and morphine  Essential hypertension - Continue losartan. Hold HCTZ while giving lasix.  Obesity: Body mass index is 32.65 kg/m.   Hyperkalemia: Resolved.   DVT prophylaxis: Xarelto Code Status: Full Family Communication: Left message for wife Disposition Plan:  Status is: Inpatient  Remains inpatient appropriate because:Inpatient level of care appropriate due to severity of illness   Dispo: The patient is from: Home              Anticipated d/c is to: TBD              Anticipated d/c date is: > 3 days              Patient currently is not medically stable to d/c, remains severely hypoxic.  Consultants:   PCCM  Procedures:   None  Antimicrobials:  Remdesivir     Objective: Vitals:   05/06/20 0834 05/06/20 0900 05/06/20 0942 05/06/20 1236  BP:  (!) 121/57    Pulse:  62  69  Resp:  17  (!) 22  Temp: (!) 97.1 F (36.2 C)   (!) 96.1 F (35.6 C)  TempSrc: Axillary   Axillary  SpO2:  95% (!) 87% 93%  Weight:      Height:        Intake/Output Summary (Last 24 hours) at 05/06/2020 1419 Last data  filed at 05/06/2020 1000 Gross per 24 hour  Intake 240 ml  Output 2675 ml  Net -2435 ml   Filed Weights   04/24/20 1400 04/24/20 1905 05/05/20 0500  Weight: 111.1 kg 108.4 kg 100.3 kg   Gen: Alert and oriented x3, no acute distress HEENT: Tongue is dry with several small millimeter scabbed over blistersPulm: Tachypneic, mildly labored breathing, decreased breath sounds throughout CV: Regular rate and rhythm, S1-S2 GI: Soft, nontender, nondistended, positive bowel sounds Ext: No clubbing or cyanosis or edema Skin: No skin breaks, tears or lesions Neuro: No focal deficits Psych: Appropriate, no evidence of  psychoses  Data Reviewed: I have personally reviewed following labs and imaging studies  CBC: Recent Labs  Lab 04/30/20 0347 05/01/20 0327 05/02/20 0257 05/03/20 0247 05/04/20 0305  WBC 28.3* 24.3* 27.3* 26.3* 28.9*  NEUTROABS 25.9* 21.7* 24.4* 24.1* 26.0*  HGB 14.3 13.7 14.4 14.6 14.1  HCT 44.3 43.0 44.5 44.9 44.7  MCV 93.1 93.7 93.5 93.0 93.5  PLT 453* 467* 485* 465* 542   Basic Metabolic Panel: Recent Labs  Lab 05/01/20 0327 05/02/20 0257 05/03/20 0247 05/04/20 0305 05/05/20 0247  NA 138 139 136 130* 135  K 4.8 5.2* 5.0 4.4 4.4  CL 98 101 95* 92* 94*  CO2 27 27 28 25 29   GLUCOSE 244* 199* 333* 302* 191*  BUN 36* 31* 53* 51* 48*  CREATININE 1.06 0.80 1.30* 1.05 0.96  CALCIUM 8.3* 8.4* 8.4* 8.0* 8.2*  MG 2.8* 2.8* 2.6* 2.7*  --    GFR: Estimated Creatinine Clearance: 93.1 mL/min (by C-G formula based on SCr of 0.96 mg/dL). Liver Function Tests: Recent Labs  Lab 05/01/20 0327 05/02/20 0257 05/03/20 0247 05/04/20 0305 05/05/20 0247  AST 23 28 28 29 28   ALT 20 22 25 26 29   ALKPHOS 65 70 71 68 81  BILITOT 0.5 0.6 0.5 0.8 0.7  PROT 6.3* 6.8 6.6 6.1* 6.4*  ALBUMIN 2.5* 2.7* 2.7* 2.6* 2.8*   No results for input(s): LIPASE, AMYLASE in the last 168 hours. No results for input(s): AMMONIA in the last 168 hours. Coagulation Profile: No results for input(s): INR, PROTIME in the last 168 hours. Cardiac Enzymes: No results for input(s): CKTOTAL, CKMB, CKMBINDEX, TROPONINI in the last 168 hours. BNP (last 3 results) No results for input(s): PROBNP in the last 8760 hours. HbA1C: No results for input(s): HGBA1C in the last 72 hours. CBG: Recent Labs  Lab 05/05/20 1116 05/05/20 1630 05/05/20 2012 05/06/20 0729 05/06/20 1228  GLUCAP 188* 155* 158* 115* 224*   Lipid Profile: No results for input(s): CHOL, HDL, LDLCALC, TRIG, CHOLHDL, LDLDIRECT in the last 72 hours. Thyroid Function Tests: No results for input(s): TSH, T4TOTAL, FREET4, T3FREE, THYROIDAB in  the last 72 hours. Anemia Panel: No results for input(s): VITAMINB12, FOLATE, FERRITIN, TIBC, IRON, RETICCTPCT in the last 72 hours. Urine analysis:    Component Value Date/Time   COLORURINE YELLOW 12/22/2007 1120   APPEARANCEUR CLEAR 12/22/2007 1120   LABSPEC 1.011 12/22/2007 1120   PHURINE 6.0 12/22/2007 1120   GLUCOSEU 100 (A) 12/22/2007 1120   HGBUR MODERATE (A) 12/22/2007 1120   HGBUR negative 12/21/2007 0000   BILIRUBINUR NEGATIVE 12/22/2007 1120   KETONESUR NEGATIVE 12/22/2007 1120   PROTEINUR NEGATIVE 12/22/2007 1120   UROBILINOGEN 0.2 12/22/2007 1120   NITRITE NEGATIVE 12/22/2007 1120   LEUKOCYTESUR NEGATIVE 12/22/2007 1120   No results found for this or any previous visit (from the past 240 hour(s)).    Radiology Studies: No results  found.  Scheduled Meds: . (feeding supplement) PROSource Plus  30 mL Oral BID BM  . vitamin C  500 mg Oral Daily  . baricitinib  4 mg Oral Daily  . benzonatate  100 mg Oral TID  . Chlorhexidine Gluconate Cloth  6 each Topical Daily  . feeding supplement (KATE FARMS STANDARD 1.4)  325 mL Oral BID BM  . furosemide  40 mg Intravenous Q12H  . guaiFENesin  600 mg Oral BID  . HYDROcodone-homatropine  5 mL Oral QID  . insulin aspart  0-20 Units Subcutaneous TID WC  . insulin aspart  0-5 Units Subcutaneous QHS  . insulin aspart  6 Units Subcutaneous TID WC  . insulin detemir  20 Units Subcutaneous BID  . Ipratropium-Albuterol  1 puff Inhalation QID  . linagliptin  5 mg Oral Daily  . losartan  50 mg Oral Daily  . magic mouthwash w/lidocaine  10 mL Oral QID  . mouth rinse  15 mL Mouth Rinse BID  . methylPREDNISolone (SOLU-MEDROL) injection  60 mg Intravenous Q12H  . montelukast  10 mg Oral QHS  . Rivaroxaban  15 mg Oral BID WC   Followed by  . [START ON 05/22/2020] rivaroxaban  20 mg Oral Q supper  . senna-docusate  2 tablet Oral BID  . sertraline  100 mg Oral Daily  . zinc sulfate  220 mg Oral Daily   Continuous Infusions:   LOS: 12  days   Time spent: 25 minutes.  Annita Brod, MD [facility] [email or web address] 05/06/2020, 2:19 PM

## 2020-05-06 NOTE — Progress Notes (Signed)
   NAME:  Clayton [name], MRN:  [id number], DOB:  Jan 07, 1956, LOS: 12 ADMISSION DATE:  04/24/2020, CONSULTATION DATE:  05/03/2020 REFERRING MD:  Dr Posey Pronto, CHIEF COMPLAINT:  Acute hypoxic respiratory failure  Brief History   Asked to see patient for hypoxic respiratory failure Patient being treated for Covid pneumonia, bilateral DVT Increasing oxygen requirement Past Medical History   Past Medical History:  Diagnosis Date  . Arthritis   . Asthma    pulmonary allergies- no asthma per pt  . Atrophic condition of skin   . Cellulitis/abscess - trunk   . Cyst    shoulder  . Hypertrophic condition of skin   . Lipoma   . Obesity   . Pneumonia    hx child  . Sleep apnea    no cpap  . Stones in the urinary tract   . Vitamin D deficiency    Significant Hospital Events   Chest x-ray 9/28: Diffuse bilateral pulmonary infiltrates/edema  Consults:  pccm 9/28  Procedures:  None  Significant Diagnostic Tests:  Chest x-ray 9/28  Echocardiogram 9/28-diastolic dysfunction Vascular ultrasound lower extremity 9/24 showing DVTs Micro Data:  9/20 blood cultures negative  Antimicrobials:  Remdesivir 9/22 9/25 Baricitinib 9/20>>  Interim history/subjective:  A little better but still with high FiO2 requirement  Objective   Blood pressure (!) 121/57, pulse 69, temperature (!) 96.1 F (35.6 C), temperature source Axillary, resp. rate (!) 22, height 5\' 9"  (1.753 m), weight 100.3 kg, SpO2 93 %.    FiO2 (%):  [100 %] 100 %   Intake/Output Summary (Last 24 hours) at 05/06/2020 1403 Last data filed at 05/06/2020 1000 Gross per 24 hour  Intake 240 ml  Output 2675 ml  Net -2435 ml   Filed Weights   04/24/20 1400 04/24/20 1905 05/05/20 0500  Weight: 111.1 kg 108.4 kg 100.3 kg    Examination: Middle-age gentleman, does not appear to be in extremities Shortness of breath with activity High flow and nonrebreather in place S1-S2 appreciated Bibasal rales Abdomen is soft,  nontender  Resolved Hospital Problem list     Assessment & Plan:  Acute hypoxic respiratory failure secondary to COVID-19 pneumonia and ARDS -Continue oxygen supplementation -Taper steroids -Day 13 of baricitinib  Diastolic dysfunction -Continue diuretics, Cozaar  Obstructive sleep apnea Oxygen supplementation -Intolerant of CPAP  DVT Type 2 diabetes Morbid obesity   Best practice:  Diet: Per primary Glucose control: SSI Mobility: Bedrest Code Status: Full code Disposition: Stepdown still high risk he will need intubation    Sherrilyn Rist, MD [place] PCCM Pager: [phone]

## 2020-05-07 DIAGNOSIS — R739 Hyperglycemia, unspecified: Secondary | ICD-10-CM | POA: Diagnosis not present

## 2020-05-07 LAB — C-REACTIVE PROTEIN: CRP: 0.9 mg/dL (ref <1.0)

## 2020-05-07 LAB — GLUCOSE, CAPILLARY
Glucose-Capillary: 153 mg/dL — ABNORMAL HIGH (ref 70–99)
Glucose-Capillary: 252 mg/dL — ABNORMAL HIGH (ref 70–99)
Glucose-Capillary: 154 mg/dL — ABNORMAL HIGH (ref 70–99)
Glucose-Capillary: 100 mg/dL — ABNORMAL HIGH (ref 70–99)

## 2020-05-07 MED ORDER — ALBUMIN HUMAN 5 % IV SOLN
12.5000 g | Freq: Once | INTRAVENOUS | Status: AC
  Administered 2020-05-07: 12.5 g via INTRAVENOUS
  Filled 2020-05-07: qty 250

## 2020-05-07 NOTE — Progress Notes (Signed)
   NAME:  Clayton [name], MRN:  [id number], DOB:  07/27/1956, LOS: 1 ADMISSION DATE:  04/24/2020, CONSULTATION DATE:  05/03/2020 REFERRING MD:  Dr Posey Pronto, CHIEF COMPLAINT:  Acute hypoxic respiratory failure  Brief History   Asked to see patient for hypoxic respiratory failure Patient being treated for Covid pneumonia, bilateral DVT Increasing oxygen requirement Past Medical History   Past Medical History:  Diagnosis Date  . Arthritis   . Asthma    pulmonary allergies- no asthma per pt  . Atrophic condition of skin   . Cellulitis/abscess - trunk   . Cyst    shoulder  . Hypertrophic condition of skin   . Lipoma   . Obesity   . Pneumonia    hx child  . Sleep apnea    no cpap  . Stones in the urinary tract   . Vitamin D deficiency    Significant Hospital Events   Chest x-ray 9/28: Diffuse bilateral pulmonary infiltrates/edema  Consults:  pccm 9/28  Procedures:  None  Significant Diagnostic Tests:  Chest x-ray 9/28  Echocardiogram 9/28-diastolic dysfunction Vascular ultrasound lower extremity 9/24 showing DVTs Micro Data:  9/20 blood cultures negative  Antimicrobials:  Remdesivir 9/22 9/25 Baricitinib 9/20>>  Interim history/subjective:  A little better but still with high FiO2 requirement I did try to take him off nonrebreather mask today but saturations did drop to about 84% with high flow in place  Objective   Blood pressure (!) 152/63, pulse (!) 111, temperature 97.7 F (36.5 C), temperature source Oral, resp. rate (!) 32, height 5\' 9"  (1.753 m), weight 100.3 kg, SpO2 95 %.    FiO2 (%):  [80 %-100 %] 100 %   Intake/Output Summary (Last 24 hours) at 05/07/2020 1138 Last data filed at 05/07/2020 0000 Gross per 24 hour  Intake 240 ml  Output 750 ml  Net -510 ml   Filed Weights   04/24/20 1400 04/24/20 1905 05/05/20 0500  Weight: 111.1 kg 108.4 kg 100.3 kg    Examination: Middle-age gentleman, not in distress High flow oxygen in place S1-S2  appreciated Bibasal rales Abdomen is soft, nontender, bowel sounds appreciated No clubbing no edema  neurologically alert oriented  Resolved Hospital Problem list     Assessment & Plan:  Acute hypoxic respiratory failure secondary to COVID-19 pneumonia and ARDS -Continue oxygen supplementation -Continue steroids -Today will be day 14 of baricitinib, can be stopped after today's dose  Diastolic dysfunction -Continue diuretics -Continue Cozaar  Obstructive sleep apnea -Oxygen supplementation -Intolerant of CPAP  DVT Type 2 diabetes Morbid obesity -Continue current care   Risk of intubation remains high   Sherrilyn Rist, MD [place] PCCM Pager: [phone]

## 2020-05-07 NOTE — Progress Notes (Signed)
PROGRESS NOTE  Clayton [name]  TGY:[id number] DOB: July 08, 1956 DOA: 04/24/2020 PCP: Kristen Loader, FNP   Brief Narrative: Clayton [name] is a 64 y.o. male with a history of depression, asthma, OSA not on CPAP, type II DM, obesity. Presents with complaints of cough and shortness of breath. Found to have COVID-19 pneumonia. Also found to have bilateral DVT. He has been treated in the ICU/SDU with heated high flow due to persistent and severe hypoxemia.   Patient continues to have significant hypoxia, has been on 55 L at 100% heated high flow.  Finally seems to be improving somewhat, able to wean his oxygenation down to 45 L/min at 80%.    Patient gets easily winded, feels about the same, fatigue.  Has begun steroid taper.  Procalcitonin and CRP are within normal limits.  Has complained of some mouth ulcers.  Overnight, did well but this morning, more hypoxic requiring oxygen to be increased from 45 L at 80% up to 50 L at 100% before oxygen saturations were >89%.  Patient feels tired, but denies any chest pain.  Mouth ulcers are little better.  Assessment & Plan: Active Problems:   Acute hypoxemic respiratory failure due to COVID-19 (HCC)   Hypokalemia   Hyperglycemia   Uncontrolled type 2 diabetes mellitus with hyperglycemia (HCC)  Acute hypoxic respiratory failure, ARDS and sepsis secondary to COVID-19 pneumonia POA. Sepsis has resolved. - Continues to require heated high flow oxygen, remain in ICU/SDU. Appreciate PCCM involvement as he is at risk of deterioration and is full code.  - S/p remdesivir.   - Continue baricitinib (9/20 - 10/3) - CRP sustaining improvement, has gotten 10 days high-dose steroids which we will taper.  - Continue lasix.. BUN:Cr ~50:1. G1DD, preserved LVEF on limited echo 9/28.  He has diuresed over 15 L. - Antitussives ABG just noted hypoxia.  Was noting some improvement in his oxygenation, but may have to go much more slowly when attempting to wean down.  Oral  ulcers, may be secondary to dry mouth from continued oxygenation mask.  Started Magic mouthwash as needed on 10/1, changed to scheduled as of 10/2.  Obesity: Patient meets criteria for BMI greater than 30  OSA: Not on CPAP  Uncontrolled T2DM with hyperglycemia: HbA1c 8.4%.  CBGs have been relatively stable. - Control improving, continue SSI, insulin detemir, and linagliptin.   Acute bilateral lower extremity DVTs: - Continue xarelto (s/p lovenox)  Hypokalemia, hyponatremia - Monitoring  Depression, anxiety:  - Continue home regimen. Continue prn ativan and morphine  Essential hypertension - Continue losartan. Hold HCTZ while giving lasix.  Obesity: Body mass index is 32.65 kg/m.   Hyperkalemia: Resolved.   DVT prophylaxis: Xarelto Code Status: Full Family Communication: Left message for wife Disposition Plan:  Status is: Inpatient  Remains inpatient appropriate because:Inpatient level of care appropriate due to severity of illness   Dispo: The patient is from: Home              Anticipated d/c is to: TBD              Anticipated d/c date is: > 3 days              Patient currently is not medically stable to d/c, remains severely hypoxic.  Consultants:   PCCM  Procedures:   None  Antimicrobials:  Remdesivir     Objective: Vitals:   05/07/20 0955 05/07/20 0956 05/07/20 0958 05/07/20 1028  BP:      Pulse: (!) 123 Marland Kitchen)  121 88 92  Resp: 20 (!) 25 19 18   Temp:      TempSrc:      SpO2: (!) 67% (!) 82% 94% 97%  Weight:      Height:        Intake/Output Summary (Last 24 hours) at 05/07/2020 1058 Last data filed at 05/07/2020 0000 Gross per 24 hour  Intake 240 ml  Output 750 ml  Net -510 ml   Filed Weights   04/24/20 1400 04/24/20 1905 05/05/20 0500  Weight: 111.1 kg 108.4 kg 100.3 kg   Gen: Alert and oriented x3, mild respiratory distress distress HEENT: Tongue is dry with several small millimeter scabbed over blisters Pulm: Tachypneic, labored  breathing, decreased breath sounds throughout CV: Regular rhythm, borderline tachycardia, S1-S2 GI: Soft, nontender, nondistended, positive bowel sounds Ext: No clubbing or cyanosis or edema Skin: No skin breaks, tears or lesions Neuro: No focal deficits Psych: Appropriate, no evidence of psychoses  Data Reviewed: I have personally reviewed following labs and imaging studies  CBC: Recent Labs  Lab 05/01/20 0327 05/02/20 0257 05/03/20 0247 05/04/20 0305  WBC 24.3* 27.3* 26.3* 28.9*  NEUTROABS 21.7* 24.4* 24.1* 26.0*  HGB 13.7 14.4 14.6 14.1  HCT 43.0 44.5 44.9 44.7  MCV 93.7 93.5 93.0 93.5  PLT 467* 485* 465* 001   Basic Metabolic Panel: Recent Labs  Lab 05/01/20 0327 05/02/20 0257 05/03/20 0247 05/04/20 0305 05/05/20 0247  NA 138 139 136 130* 135  K 4.8 5.2* 5.0 4.4 4.4  CL 98 101 95* 92* 94*  CO2 27 27 28 25 29   GLUCOSE 244* 199* 333* 302* 191*  BUN 36* 31* 53* 51* 48*  CREATININE 1.06 0.80 1.30* 1.05 0.96  CALCIUM 8.3* 8.4* 8.4* 8.0* 8.2*  MG 2.8* 2.8* 2.6* 2.7*  --    GFR: Estimated Creatinine Clearance: 93.1 mL/min (by C-G formula based on SCr of 0.96 mg/dL). Liver Function Tests: Recent Labs  Lab 05/01/20 0327 05/02/20 0257 05/03/20 0247 05/04/20 0305 05/05/20 0247  AST 23 28 28 29 28   ALT 20 22 25 26 29   ALKPHOS 65 70 71 68 81  BILITOT 0.5 0.6 0.5 0.8 0.7  PROT 6.3* 6.8 6.6 6.1* 6.4*  ALBUMIN 2.5* 2.7* 2.7* 2.6* 2.8*   No results for input(s): LIPASE, AMYLASE in the last 168 hours. No results for input(s): AMMONIA in the last 168 hours. Coagulation Profile: No results for input(s): INR, PROTIME in the last 168 hours. Cardiac Enzymes: No results for input(s): CKTOTAL, CKMB, CKMBINDEX, TROPONINI in the last 168 hours. BNP (last 3 results) No results for input(s): PROBNP in the last 8760 hours. HbA1C: No results for input(s): HGBA1C in the last 72 hours. CBG: Recent Labs  Lab 05/06/20 0729 05/06/20 1228 05/06/20 1620 05/06/20 2003  05/07/20 0742  GLUCAP 115* 224* 198* 187* 153*   Lipid Profile: No results for input(s): CHOL, HDL, LDLCALC, TRIG, CHOLHDL, LDLDIRECT in the last 72 hours. Thyroid Function Tests: No results for input(s): TSH, T4TOTAL, FREET4, T3FREE, THYROIDAB in the last 72 hours. Anemia Panel: No results for input(s): VITAMINB12, FOLATE, FERRITIN, TIBC, IRON, RETICCTPCT in the last 72 hours. Urine analysis:    Component Value Date/Time   COLORURINE YELLOW 12/22/2007 1120   APPEARANCEUR CLEAR 12/22/2007 1120   LABSPEC 1.011 12/22/2007 1120   PHURINE 6.0 12/22/2007 1120   GLUCOSEU 100 (A) 12/22/2007 1120   HGBUR MODERATE (A) 12/22/2007 1120   HGBUR negative 12/21/2007 0000   BILIRUBINUR NEGATIVE 12/22/2007 [place] 12/22/2007  [place] 12/22/2007 1120   UROBILINOGEN 0.2 12/22/2007 1120   NITRITE NEGATIVE 12/22/2007 1120   LEUKOCYTESUR NEGATIVE 12/22/2007 1120   No results found for this or any previous visit (from the past 240 hour(s)).    Radiology Studies: No results found.  Scheduled Meds:  (feeding supplement) PROSource Plus  30 mL Oral BID BM   vitamin C  500 mg Oral Daily   benzonatate  100 mg Oral TID   Chlorhexidine Gluconate Cloth  6 each Topical Daily   feeding supplement (KATE FARMS STANDARD 1.4)  325 mL Oral BID BM   furosemide  40 mg Intravenous Q12H   guaiFENesin  600 mg Oral BID   HYDROcodone-homatropine  5 mL Oral QID   insulin aspart  0-20 Units Subcutaneous TID WC   insulin aspart  0-5 Units Subcutaneous QHS   insulin aspart  6 Units Subcutaneous TID WC   insulin detemir  20 Units Subcutaneous BID   Ipratropium-Albuterol  1 puff Inhalation QID   linagliptin  5 mg Oral Daily   losartan  50 mg Oral Daily   magic mouthwash w/lidocaine  10 mL Oral QID   mouth rinse  15 mL Mouth Rinse BID   methylPREDNISolone (SOLU-MEDROL) injection  60 mg Intravenous Q12H   montelukast  10 mg Oral QHS   Rivaroxaban  15 mg Oral BID  WC   Followed by   Derrill Memo ON 05/22/2020] rivaroxaban  20 mg Oral Q supper   senna-docusate  2 tablet Oral BID   sertraline  100 mg Oral Daily   zinc sulfate  220 mg Oral Daily   Continuous Infusions:   LOS: 13 days   Time spent: 25 minutes.  Annita Brod, MD [facility] [email or web address] 05/07/2020, 10:58 AM

## 2020-05-08 LAB — BASIC METABOLIC PANEL
Anion gap: 15 (ref 5–15)
BUN: 39 mg/dL — ABNORMAL HIGH (ref 8–23)
CO2: 36 mmol/L — ABNORMAL HIGH (ref 22–32)
Calcium: 8.6 mg/dL — ABNORMAL LOW (ref 8.9–10.3)
Chloride: 87 mmol/L — ABNORMAL LOW (ref 98–111)
Creatinine, Ser: 0.89 mg/dL (ref 0.61–1.24)
GFR calc Af Amer: >60 mL/min (ref >60)
GFR calc non Af Amer: >60 mL/min (ref >60)
Glucose, Bld: 122 mg/dL — ABNORMAL HIGH (ref 70–99)
Potassium: 3.9 mmol/L (ref 3.5–5.1)
Sodium: 138 mmol/L (ref 135–145)

## 2020-05-08 LAB — GLUCOSE, CAPILLARY
Glucose-Capillary: 118 mg/dL — ABNORMAL HIGH (ref 70–99)
Glucose-Capillary: 122 mg/dL — ABNORMAL HIGH (ref 70–99)
Glucose-Capillary: 132 mg/dL — ABNORMAL HIGH (ref 70–99)
Glucose-Capillary: 258 mg/dL — ABNORMAL HIGH (ref 70–99)
Glucose-Capillary: 73 mg/dL (ref 70–99)

## 2020-05-08 LAB — CBC
HCT: 44.1 % (ref 39.0–52.0)
Hemoglobin: 14 g/dL (ref 13.0–17.0)
MCH: 29.7 pg (ref 26.0–34.0)
MCHC: 31.7 g/dL (ref 30.0–36.0)
MCV: 93.6 fL (ref 80.0–100.0)
Platelets: 347 10*3/uL (ref 150–400)
RBC: 4.71 MIL/uL (ref 4.22–5.81)
RDW: 12.8 % (ref 11.5–15.5)
WBC: 26.5 10*3/uL — ABNORMAL HIGH (ref 4.0–10.5)
nRBC: 0 % (ref 0.0–0.2)

## 2020-05-08 LAB — C-REACTIVE PROTEIN: CRP: 0.8 mg/dL (ref <1.0)

## 2020-05-08 LAB — BRAIN NATRIURETIC PEPTIDE: B Natriuretic Peptide: 44.5 pg/mL (ref 0.0–100.0)

## 2020-05-08 MED ORDER — HYDROCODONE-HOMATROPINE 5-1.5 MG/5ML PO SYRP
5.0000 mL | ORAL_SOLUTION | Freq: Four times a day (QID) | ORAL | Status: DC | PRN
  Administered 2020-05-09 – 2020-05-29 (×41): 5 mL via ORAL
  Filled 2020-05-08 (×42): qty 5

## 2020-05-08 MED ORDER — PREDNISOLONE 5 MG PO TABS: 30.0000 mg | ORAL_TABLET | Freq: Every day | ORAL | Status: DC

## 2020-05-08 MED ORDER — PREDNISONE 20 MG PO TABS: 20.0000 mg | ORAL_TABLET | Freq: Every day | ORAL | Status: DC

## 2020-05-08 MED ORDER — LACTATED RINGERS IV BOLUS
250.0000 mL | Freq: Once | INTRAVENOUS | Status: AC
  Administered 2020-05-08: 250 mL via INTRAVENOUS

## 2020-05-08 MED ORDER — PREDNISONE 10 MG PO TABS: 10.0000 mg | ORAL_TABLET | Freq: Every day | ORAL | Status: DC

## 2020-05-08 MED ORDER — PREDNISONE 20 MG PO TABS: 40.0000 mg | ORAL_TABLET | Freq: Every day | ORAL | Status: DC

## 2020-05-08 MED ORDER — METHYLPREDNISOLONE SODIUM SUCC 40 MG IJ SOLR
40.0000 mg | Freq: Two times a day (BID) | INTRAMUSCULAR | Status: AC
  Administered 2020-05-08 – 2020-05-09 (×2): 40 mg via INTRAVENOUS
  Filled 2020-05-08 (×2): qty 1

## 2020-05-08 NOTE — TOC Progression Note (Signed)
Transition of Care [facility]) - Progression Note    Patient Details  Name: Clayton [name] MRN: [id number] Date of Birth: 22-Dec-1955  Transition of Care [facility]) CM/SW Contact  Leeroy Cha, RN Phone Number: 05/08/2020, 10:20 AM  Clinical Narrative:      Acute hypoxemic respiratory failure due to COVID-19 [facility])   Hypokalemia   Hyperglycemia   Uncontrolled type 2 diabetes mellitus with hyperglycemia ([place]) hfncpartial rebreather at 40l/min, iv solu medrol, wbc 26.5, Following for progression and toc needs. Married and from home.   Expected Discharge Plan: Home/Self Care Barriers to Discharge: Continued Medical Work up  Expected Discharge Plan and Services Expected Discharge Plan: Home/Self Care   Discharge Planning Services: CM Consult   Living arrangements for the past 2 months: Single Family Home                                       Social Determinants of Health (SDOH) Interventions    Readmission Risk Interventions No flowsheet data found.

## 2020-05-08 NOTE — Progress Notes (Signed)
Pt's wife dropped off food for pt at hospital lobby. Pt wife given pt's wallet as per pt request. Updated her of pt condition and plan of care. Pt's wife appreciative of care. Food delivered to pt's room.

## 2020-05-08 NOTE — Progress Notes (Signed)
PROGRESS NOTE  Clayton [name]  DOB: 17-Jan-1956  PCP: Clayton Loader, FNP BTD:[id number]  DOA: 04/24/2020  LOS: 14 days   Chief Complaint  Patient presents with  . Code Sepsis  . Shortness of Breath   Brief narrative: Clayton [name] is a 64 y.o. male with PMH of asthma, OSA not on CPAP, type II DM, obesity, depression. Patient was brought to the ED on 9/20 with complaint of cough, shortness of breath and fever of 102-104 at home.  Not vaccinated against Covid.  In the ED, he was noted to be severely hypoxic to 72% on room air and required up to 15 L of oxygen by nasal cannula. COVID-19 PCR positive. Chest x-ray showed bilateral multifocal pneumonia. He was also noted to have bilateral DVT.   He was admitted to hospital service for Covid pneumonia.   Critical care consultation was obtained as well.   His oxygen requirement continued to worsen. He was treated in the ICU/SDU with heated high flow due to persistent and severe hypoxemia.   Subjective: Patient was seen and examined this morning. Remains on 40 L oxygen by nasal cannula. Also continues to have oral pain due to ulcers.  Assessment/Plan: COVID pneumonia Acute respiratory failure with hypoxia  -Presented with high fever, shortness of breath -COVID test: PCR positive on admission -Chest imaging: Chest x-ray on admission showed multifocal pneumonia  -Treatment: Patient completed 5-day course of IV remdesivir as well as 14-day course of oral baricitinib.  He remains on IV Solu-Medrol 40 mg IV twice daily.  -Oxygen - SpO2: 92 % O2 Flow Rate (L/min): 35 L/min FiO2 (%): 70 %  -Progression: Currently remains on heated high flow oxygen by nasal cannula.  35 to 40 L/min.  Continue to wean down as tolerated. -Continue IV steroids. -Supportive care: Vitamin C, Zinc, PRN inhalers, Tylenol, Antitussives (benzonatate/ Mucinex/Tussionex).   -Encouraged incentive spirometry, prone position, out of bed and early mobilization as  much as possible -Continue airborne/contact isolation precautions for duration of 3 weeks from the day of diagnosis. -WBC and inflammatory markers trend as below.  CRP down to normal.  Lab Results  Component Value Date   SARSCOV2NAA POSITIVE (A) 04/24/2020    Recent Labs  Lab 05/02/20 0257 05/02/20 0257 05/03/20 0247 05/03/20 0247 05/04/20 0305 05/05/20 0247 05/05/20 1113 05/06/20 0255 05/07/20 0237 05/08/20 0345  WBC 27.3*  --  26.3*  --  28.9*  --   --   --   --  26.5*  PROCALCITON  --   --   --   --   --   --  <0.10  --   --   --   DDIMER 7.51*  --  8.28*  --  9.32*  --   --   --   --   --   CRP 7.6*   < > 4.9*   < > 2.1* 1.4*  --  1.0* 0.9 0.8  ALT 22  --  25  --  26 29  --   --   --   --    < > = values in this interval not displayed.   The treatment plan and use of medications and known side effects were discussed with patient/family. Some of the medications used are based on case reports/anecdotal data.  All other medications being used in the management of COVID-19 based on limited study data.  Complete risks and long-term side effects are unknown, however in the best clinical judgment they seem to be  of some benefit.  Patient wanted to proceed with treatment options provided.  Oral ulcers -Likely aphthous ulcers secondary to the stress of Covid pneumonia as well as dry mouth.   -Continue Magic mouthwash scheduled  OSA: Not on CPAP at home  Poorly controlled T2DM with hyperglycemia:  -HbA1c 8.4%.   -Continue linagliptin, Levemir 20 units twice daily, NovoLog 6 units 3 times daily along with sliding scale insulin. Recent Labs  Lab 05/07/20 1555 05/07/20 2001 05/08/20 0820 05/08/20 1232 05/08/20 1645  GLUCAP 154* 100* 122* 118* 258*   Acute bilateral lower extremity DVTs: - Continue xarelto  Depression, anxiety:  -Continue home regimen. Continue prn ativan and morphine  Essential hypertension -Continue losartan. -HCTZ on hold.  Mobility: Encourage  ambulation once oxygen level improves Code Status:   Code Status: Full Code  Nutritional status: Body mass index is 32.65 kg/m. Nutrition Problem: Increased nutrient needs Etiology: acute illness, catabolic illness (PFXTK-24 infection) Signs/Symptoms: estimated needs Diet Order            Diet Carb Modified Fluid consistency: Thin; Room service appropriate? Yes  Diet effective now                 DVT prophylaxis: SCDs Start: 04/24/20 1628 Rivaroxaban (XARELTO) tablet 15 mg  rivaroxaban (XARELTO) tablet 20 mg   Antimicrobials:  None Fluid: None Consultants: critical care Family Communication:  None at bedside  Status is: Inpatient  Remains inpatient appropriate because continues to remain dependent on heated high flow oxygen by nasal cannula  Dispo: The patient is from: Home              Anticipated d/c is to: Home              Anticipated d/c date is: > 3 days              Patient currently is not medically stable to d/c.       Infusions:    Scheduled Meds: . (feeding supplement) PROSource Plus  30 mL Oral BID BM  . vitamin C  500 mg Oral Daily  . benzonatate  100 mg Oral TID  . Chlorhexidine Gluconate Cloth  6 each Topical Daily  . feeding supplement (KATE FARMS STANDARD 1.4)  325 mL Oral BID BM  . guaiFENesin  600 mg Oral BID  . insulin aspart  0-20 Units Subcutaneous TID WC  . insulin aspart  0-5 Units Subcutaneous QHS  . insulin aspart  6 Units Subcutaneous TID WC  . insulin detemir  20 Units Subcutaneous BID  . Ipratropium-Albuterol  1 puff Inhalation QID  . linagliptin  5 mg Oral Daily  . losartan  50 mg Oral Daily  . magic mouthwash w/lidocaine  10 mL Oral QID  . mouth rinse  15 mL Mouth Rinse BID  . methylPREDNISolone (SOLU-MEDROL) injection  40 mg Intravenous Q12H  . montelukast  10 mg Oral QHS  . [START ON 05/10/2020] predniSONE  40 mg Oral Q breakfast   Followed by  . [START ON 05/12/2020] prednisoLONE  30 mg Oral Daily   Followed by  . [START  ON 05/14/2020] predniSONE  20 mg Oral Q breakfast   Followed by  . [START ON 05/16/2020] predniSONE  10 mg Oral Q breakfast  . Rivaroxaban  15 mg Oral BID WC   Followed by  . [START ON 05/22/2020] rivaroxaban  20 mg Oral Q supper  . senna-docusate  2 tablet Oral BID  . sertraline  100 mg Oral Daily  .  zinc sulfate  220 mg Oral Daily    Antimicrobials: Anti-infectives (From admission, onward)   Start     Dose/Rate Route Frequency Ordered Stop   04/25/20 1000  remdesivir 100 mg in sodium chloride 0.9 % 100 mL IVPB        100 mg 200 mL/hr over 30 Minutes Intravenous Daily 04/24/20 1547 04/28/20 0945   04/25/20 1000  remdesivir 100 mg in sodium chloride 0.9 % 100 mL IVPB  Status:  Discontinued       "Followed by" Linked Group Details   100 mg 200 mL/hr over 30 Minutes Intravenous Daily 04/24/20 1633 04/24/20 1635   04/24/20 1645  remdesivir 200 mg in sodium chloride 0.9% 250 mL IVPB  Status:  Discontinued       "Followed by" Linked Group Details   200 mg 580 mL/hr over 30 Minutes Intravenous Once 04/24/20 1633 04/24/20 1635   04/24/20 1630  remdesivir 200 mg in sodium chloride 0.9% 250 mL IVPB        200 mg 580 mL/hr over 30 Minutes Intravenous Once 04/24/20 1547 04/24/20 1802      PRN meds: acetaminophen, albuterol, hydrALAZINE, HYDROcodone-homatropine, LORazepam, methocarbamol, metoprolol tartrate, morphine injection, oxymetazoline, sodium chloride, traMADol   Objective: Vitals:   05/08/20 1615 05/08/20 1630  BP: (!) 95/50 104/60  Pulse: 86 99  Resp: (!) 26 (!) 32  Temp:    SpO2: 93% 92%    Intake/Output Summary (Last 24 hours) at 05/08/2020 1722 Last data filed at 05/08/2020 1600 Gross per 24 hour  Intake 737.21 ml  Output 2025 ml  Net -1287.79 ml   Filed Weights   04/24/20 1400 04/24/20 1905 05/05/20 0500  Weight: 111.1 kg 108.4 kg 100.3 kg   Weight change:  Body mass index is 32.65 kg/m.   Physical Exam: General exam: Appears calm and comfortable.  In  respiratory distress on high flow oxygen by nasal cannula Skin: No rashes, lesions or ulcers. HEENT: Atraumatic, normocephalic, supple neck, no obvious bleeding Lungs: Velcro-like crackles on both sides CVS: Regular rate and rhythm with no murmur GI/Abd soft, nontender, nondistended, bowel sound present CNS: Alert, awake, oriented x3 Psychiatry: Depressed look Extremities: No pedal edema, no calf tenderness  Data Review: I have personally reviewed the laboratory data and studies available.  Recent Labs  Lab 05/02/20 0257 05/03/20 0247 05/04/20 0305 05/08/20 0345  WBC 27.3* 26.3* 28.9* 26.5*  NEUTROABS 24.4* 24.1* 26.0*  --   HGB 14.4 14.6 14.1 14.0  HCT 44.5 44.9 44.7 44.1  MCV 93.5 93.0 93.5 93.6  PLT 485* 465* 359 347   Recent Labs  Lab 05/02/20 0257 05/03/20 0247 05/04/20 0305 05/05/20 0247 05/08/20 0345  NA 139 136 130* 135 138  K 5.2* 5.0 4.4 4.4 3.9  CL 101 95* 92* 94* 87*  CO2 27 28 25 29  36*  GLUCOSE 199* 333* 302* 191* 122*  BUN 31* 53* 51* 48* 39*  CREATININE 0.80 1.30* 1.05 0.96 0.89  CALCIUM 8.4* 8.4* 8.0* 8.2* 8.6*  MG 2.8* 2.6* 2.7*  --   --     F/u labs ordered  Signed, Terrilee Croak, MD [facility] 05/08/2020

## 2020-05-08 NOTE — Progress Notes (Signed)
   NAME:  Clayton [name], MRN:  [id number], DOB:  [birth year], LOS: 45 ADMISSION DATE:  04/24/2020, CONSULTATION DATE:  05/03/2020 REFERRING MD:  Dr Posey Pronto, CHIEF COMPLAINT:  Acute hypoxic respiratory failure  Brief History   Asked to see patient for hypoxic respiratory failure Patient being treated for Covid pneumonia, bilateral DVT Increasing oxygen requirement Past Medical History   Past Medical History:  Diagnosis Date  . Arthritis   . Asthma    pulmonary allergies- no asthma per pt  . Atrophic condition of skin   . Cellulitis/abscess - trunk   . Cyst    shoulder  . Hypertrophic condition of skin   . Lipoma   . Obesity   . Pneumonia    hx child  . Sleep apnea    no cpap  . Stones in the urinary tract   . Vitamin D deficiency    Significant Hospital Events   Chest x-ray 9/28: Diffuse bilateral pulmonary infiltrates/edema  Consults:  pccm 9/28  Procedures:  None  Significant Diagnostic Tests:  Chest x-ray 9/28  Echocardiogram 9/28-diastolic dysfunction Vascular ultrasound lower extremity 9/24 showing DVTs Micro Data:  9/20 blood cultures negative  Antimicrobials:  Remdesivir 9/22 9/25 Baricitinib 9/20>>completed 10/3  Interim history/subjective:   Objective   Blood pressure (Abnormal) 112/54, pulse 71, temperature 97.8 F (36.6 C), temperature source Axillary, resp. rate (Abnormal) 21, height 5\' 9"  (1.753 m), weight 100.3 kg, SpO2 91 %.    FiO2 (%):  [70 %-80 %] 70 %   Intake/Output Summary (Last 24 hours) at 05/08/2020 1028 Last data filed at 05/08/2020 0100 Gross per 24 hour  Intake no documentation  Output 2200 ml  Net -2200 ml   Filed Weights   04/24/20 1400 04/24/20 1905 05/05/20 0500  Weight: 111.1 kg 108.4 kg 100.3 kg    Examination: General: This is a 64 year old white male he is currently still on heated high flow and nonrebreather mask he is in no distress at rest but rapidly desaturates with removal of mask or significant exertion such as a  cough HEENT normocephalic atraumatic no jugular venous distention appreciated Pulmonary: Diminished throughout.  Currently 70% FiO2 as well as nonrebreather mask with all flaps removed.  This is a reduction in comparison to last week Cardiac: Regular rate and rhythm Abdomen: Soft nontender Extremities: Warm dry with brisk capillary refill Neuro: Awake oriented no focal deficits appreciated.  Resolved Hospital Problem list     Assessment & Plan:  Acute hypoxic respiratory failure secondary to COVID-19 pneumonia and ARDS Diastolic dysfunction Obstructive sleep apnea DVT Type 2 diabetes Morbid obesity -  Discussion Persistent and ongoing hypoxic respiratory failure in the setting of ARDS from Covid pneumonia.  He has made some marginal improvement when comparing his exam from last week he is now down to 70% FiO2.  Unfortunately there is little else to offer at this point other than time, intermittent diuresis, and supportive care.  Plan Continue supplemental oxygen Daily assessment for diuretics Mobilize as able Slow steroid taper (I will place this in order section)   We will be available as needed  Erick Colace ACNP-BC [place] Pager # [phone] OR # [phone] if no answer

## 2020-05-09 DIAGNOSIS — J9601 Acute respiratory failure with hypoxia: Secondary | ICD-10-CM | POA: Diagnosis not present

## 2020-05-09 DIAGNOSIS — U071 COVID-19: Secondary | ICD-10-CM | POA: Diagnosis not present

## 2020-05-09 LAB — C-REACTIVE PROTEIN: CRP: 1.3 mg/dL — ABNORMAL HIGH (ref <1.0)

## 2020-05-09 LAB — BASIC METABOLIC PANEL
Anion gap: 14 (ref 5–15)
BUN: 37 mg/dL — ABNORMAL HIGH (ref 8–23)
CO2: 34 mmol/L — ABNORMAL HIGH (ref 22–32)
Calcium: 8.8 mg/dL — ABNORMAL LOW (ref 8.9–10.3)
Chloride: 89 mmol/L — ABNORMAL LOW (ref 98–111)
Creatinine, Ser: 0.89 mg/dL (ref 0.61–1.24)
GFR calc Af Amer: >60 mL/min (ref >60)
GFR calc non Af Amer: >60 mL/min (ref >60)
Glucose, Bld: 110 mg/dL — ABNORMAL HIGH (ref 70–99)
Potassium: 3.5 mmol/L (ref 3.5–5.1)
Sodium: 137 mmol/L (ref 135–145)

## 2020-05-09 LAB — CBC WITH DIFFERENTIAL/PLATELET
Abs Immature Granulocytes: 0.37 10*3/uL — ABNORMAL HIGH (ref 0.00–0.07)
Basophils Absolute: 0.1 10*3/uL (ref 0.0–0.1)
Basophils Relative: 0 %
Eosinophils Absolute: 0.3 10*3/uL (ref 0.0–0.5)
Eosinophils Relative: 1 %
HCT: 41.8 % (ref 39.0–52.0)
Hemoglobin: 13.9 g/dL (ref 13.0–17.0)
Immature Granulocytes: 1 %
Lymphocytes Relative: 4 %
Lymphs Abs: 1.3 10*3/uL (ref 0.7–4.0)
MCH: 30.6 pg (ref 26.0–34.0)
MCHC: 33.3 g/dL (ref 30.0–36.0)
MCV: 92.1 fL (ref 80.0–100.0)
Monocytes Absolute: 1.7 10*3/uL — ABNORMAL HIGH (ref 0.1–1.0)
Monocytes Relative: 5 %
Neutro Abs: 29.1 10*3/uL — ABNORMAL HIGH (ref 1.7–7.7)
Neutrophils Relative %: 89 %
Platelets: 391 10*3/uL (ref 150–400)
RBC: 4.54 MIL/uL (ref 4.22–5.81)
RDW: 12.8 % (ref 11.5–15.5)
WBC: 32.8 10*3/uL — ABNORMAL HIGH (ref 4.0–10.5)
nRBC: 0 % (ref 0.0–0.2)

## 2020-05-09 LAB — GLUCOSE, CAPILLARY
Glucose-Capillary: 148 mg/dL — ABNORMAL HIGH (ref 70–99)
Glucose-Capillary: 127 mg/dL — ABNORMAL HIGH (ref 70–99)
Glucose-Capillary: 109 mg/dL — ABNORMAL HIGH (ref 70–99)
Glucose-Capillary: 172 mg/dL — ABNORMAL HIGH (ref 70–99)

## 2020-05-09 MED ORDER — METHYLPREDNISOLONE SODIUM SUCC 125 MG IJ SOLR
60.0000 mg | Freq: Three times a day (TID) | INTRAMUSCULAR | Status: DC
  Administered 2020-05-09 – 2020-05-16 (×21): 60 mg via INTRAVENOUS
  Filled 2020-05-09 (×21): qty 2

## 2020-05-09 MED ORDER — METHYLPREDNISOLONE SODIUM SUCC 125 MG IJ SOLR: 60.0000 mg | Freq: Two times a day (BID) | INTRAMUSCULAR | Status: DC

## 2020-05-09 NOTE — Progress Notes (Signed)
OT Cancellation Note  Patient Details Name: Clayton [name] MRN: [id number] DOB: 06/25/1956   Cancelled Treatment:    Reason Eval/Treat Not Completed: Patient not medically ready  Spoke with RN.  RN did dangle with pt earlier and reported pt got very SOB. RN preferred OT hold eval at this time.  Kari Baars, OT [facility] Services [phone] Office- [phone], Edwena Felty D 05/09/2020, 2:25 PM

## 2020-05-09 NOTE — Progress Notes (Signed)
PROGRESS NOTE  Clayton [name]  DOB: [birth year]  PCP: Clayton Loader, FNP YJE:[id number]  DOA: 04/24/2020  LOS: 15 days   Chief Complaint  Patient presents with  . Code Sepsis  . Shortness of Breath   Brief narrative: Clayton [name] is a 64 y.o. male with PMH of asthma, OSA not on CPAP, type II DM, obesity, depression. Patient was brought to the ED on 9/20 with complaint of cough, shortness of breath and fever of 102-104 at home. Not vaccinated against Covid.  In the ED, he was noted to be severely hypoxic to 72% on room air and required up to 15 L of oxygen by nasal cannula. COVID-19 PCR positive. Chest x-ray showed bilateral multifocal pneumonia. He was also noted to have bilateral DVT.   He was admitted to hospital service for Covid pneumonia.   Critical care consultation was obtained as well.   His oxygen requirement continued to worsen. He was treated in the ICU/SDU with heated high flow due to persistent and severe hypoxemia.   Subjective: Patient was seen and examined this morning. Remains on 40 L oxygen by nasal cannula. Also continues to have oral pain due to ulcers.  Patient states he has not been able to take oral appetite and wants to try liquid diet only.  Assessment/Plan: COVID pneumonia Acute respiratory failure with hypoxia  -Presented with high fever, shortness of breath -COVID test: PCR positive on admission -Chest imaging: Chest x-ray on admission showed multifocal pneumonia  -Treatment: Patient completed 5-day course of IV remdesivir as well as 14-day course of oral baricitinib.  Is currently in a tapering course of oral prednisone.   -On today's lab, CRP seems to be trending up.  Patient still requiring high flow oxygen.  I will switch him back from oral prednisone to IV Solu-Medrol at 60 mg three times daily. -Oxygen - SpO2: 94 % O2 Flow Rate (L/min): 25 L/min (+ PRB) FiO2 (%): 70 % -Continue to wean down as tolerated. -Supportive care: Vitamin C,  Zinc, PRN inhalers, Tylenol, Antitussives (benzonatate/ Mucinex/Tussionex).   -Encouraged incentive spirometry, prone position, out of bed and early mobilization as much as possible -Continue airborne/contact isolation precautions for duration of 3 weeks from the day of diagnosis. -WBC and inflammatory markers trend as below.  CRP slightly up today.  WBC count running high likely because of steroids.  Lab Results  Component Value Date   SARSCOV2NAA POSITIVE (A) 04/24/2020    Recent Labs  Lab 05/03/20 0247 05/03/20 0247 05/04/20 0305 05/04/20 0305 05/05/20 0247 05/05/20 1113 05/06/20 0255 05/07/20 0237 05/08/20 0345 05/09/20 0315  WBC 26.3*  --  28.9*  --   --   --   --   --  26.5* 32.8*  PROCALCITON  --   --   --   --   --  <0.10  --   --   --   --   DDIMER 8.28*  --  9.32*  --   --   --   --   --   --   --   CRP 4.9*   < > 2.1*   < > 1.4*  --  1.0* 0.9 0.8 1.3*  ALT 25  --  26  --  29  --   --   --   --   --    < > = values in this interval not displayed.   The treatment plan and use of medications and known side effects were discussed with patient/family.  Some of the medications used are based on case reports/anecdotal data.  All other medications being used in the management of COVID-19 based on limited study data.  Complete risks and long-term side effects are unknown, however in the best clinical judgment they seem to be of some benefit.  Patient wanted to proceed with treatment options provided.  Oral ulcers -Likely aphthous ulcers secondary to the stress of Covid pneumonia as well as dry mouth.   -Continue Magic mouthwash scheduled. -Patient says he cannot eat solid food because of pain.  He wants to try liquid diet.  OSA: Not on CPAP at home  Poorly controlled T2DM with hyperglycemia:  -HbA1c 8.4%.   -Continue linagliptin, Levemir 20 units twice daily, NovoLog 6 units 3 times daily along with sliding scale insulin.  Blood sugar level likely will trend up with IV  steroids.  Continue to monitor. Recent Labs  Lab 05/08/20 1645 05/08/20 2003 05/08/20 2340 05/09/20 0813 05/09/20 1108  GLUCAP 258* 73 132* 148* 127*   Acute bilateral lower extremity DVTs: -Continue xarelto  Depression, anxiety:  -Continue home regimen. Continue prn ativan and morphine  Chronic diastolic CHF Essential hypertension -Echocardiogram from 9/28 showed EF of 65 to 29% grade 1 diastolic dysfunction. -Continue losartan. -HCTZ on hold.  Mobility: Encourage ambulation once oxygen level improves Code Status:   Code Status: Full Code  Nutritional status: Body mass index is 32.59 kg/m. Nutrition Problem: Increased nutrient needs Etiology: acute illness, catabolic illness (JMEQA-83 infection) Signs/Symptoms: estimated needs Diet Order            Diet full liquid Room service appropriate? Yes; Fluid consistency: Thin  Diet effective now                 DVT prophylaxis: SCDs Start: 04/24/20 1628 Rivaroxaban (XARELTO) tablet 15 mg  rivaroxaban (XARELTO) tablet 20 mg   Antimicrobials:  None Fluid: None Consultants: critical care Family Communication:  called and updated patient's wife this afternoon.  Status is: Inpatient  Remains inpatient appropriate because continues to remain dependent on heated high flow oxygen by nasal cannula  Dispo: The patient is from: Home              Anticipated d/c is to: Home              Anticipated d/c date is: > 3 days              Patient currently is not medically stable to d/c.       Infusions:    Scheduled Meds: . (feeding supplement) PROSource Plus  30 mL Oral BID BM  . vitamin C  500 mg Oral Daily  . benzonatate  100 mg Oral TID  . Chlorhexidine Gluconate Cloth  6 each Topical Daily  . feeding supplement (KATE FARMS STANDARD 1.4)  325 mL Oral BID BM  . guaiFENesin  600 mg Oral BID  . insulin aspart  0-20 Units Subcutaneous TID WC  . insulin aspart  0-5 Units Subcutaneous QHS  . insulin aspart  6 Units  Subcutaneous TID WC  . insulin detemir  20 Units Subcutaneous BID  . Ipratropium-Albuterol  1 puff Inhalation QID  . linagliptin  5 mg Oral Daily  . losartan  50 mg Oral Daily  . magic mouthwash w/lidocaine  10 mL Oral QID  . mouth rinse  15 mL Mouth Rinse BID  . methylPREDNISolone (SOLU-MEDROL) injection  60 mg Intravenous Q8H  . montelukast  10 mg Oral QHS  . Rivaroxaban  15 mg Oral BID WC   Followed by  . [START ON 05/22/2020] rivaroxaban  20 mg Oral Q supper  . senna-docusate  2 tablet Oral BID  . sertraline  100 mg Oral Daily  . zinc sulfate  220 mg Oral Daily    Antimicrobials: Anti-infectives (From admission, onward)   Start     Dose/Rate Route Frequency Ordered Stop   04/25/20 1000  remdesivir 100 mg in sodium chloride 0.9 % 100 mL IVPB        100 mg 200 mL/hr over 30 Minutes Intravenous Daily 04/24/20 1547 04/28/20 0945   04/25/20 1000  remdesivir 100 mg in sodium chloride 0.9 % 100 mL IVPB  Status:  Discontinued       "Followed by" Linked Group Details   100 mg 200 mL/hr over 30 Minutes Intravenous Daily 04/24/20 1633 04/24/20 1635   04/24/20 1645  remdesivir 200 mg in sodium chloride 0.9% 250 mL IVPB  Status:  Discontinued       "Followed by" Linked Group Details   200 mg 580 mL/hr over 30 Minutes Intravenous Once 04/24/20 1633 04/24/20 1635   04/24/20 1630  remdesivir 200 mg in sodium chloride 0.9% 250 mL IVPB        200 mg 580 mL/hr over 30 Minutes Intravenous Once 04/24/20 1547 04/24/20 1802      PRN meds: acetaminophen, albuterol, hydrALAZINE, HYDROcodone-homatropine, LORazepam, methocarbamol, metoprolol tartrate, morphine injection, oxymetazoline, sodium chloride, traMADol   Objective: Vitals:   05/09/20 1110 05/09/20 1441  BP:    Pulse:    Resp:    Temp: 98.1 F (36.7 C)   SpO2:  94%    Intake/Output Summary (Last 24 hours) at 05/09/2020 1444 Last data filed at 05/09/2020 0600 Gross per 24 hour  Intake 257.21 ml  Output 1075 ml  Net -817.79 ml    Filed Weights   04/24/20 1905 05/05/20 0500 05/09/20 0401  Weight: 108.4 kg 100.3 kg 100.1 kg   Weight change:  Body mass index is 32.59 kg/m.   Physical Exam: General exam:  In respiratory distress on high flow oxygen by nasal cannula Skin: No rashes, lesions or ulcers. HEENT: Atraumatic, normocephalic, supple neck, no obvious bleeding Lungs: Velcro-like crackles on both lungs CVS: Regular rate and rhythm with no murmur GI/Abd soft, nontender, nondistended, bowel sound present CNS: Alert, awake, oriented x3 Psychiatry: Depressed look Extremities: No pedal edema, no calf tenderness  Data Review: I have personally reviewed the laboratory data and studies available.  Recent Labs  Lab 05/03/20 0247 05/04/20 0305 05/08/20 0345 05/09/20 0315  WBC 26.3* 28.9* 26.5* 32.8*  NEUTROABS 24.1* 26.0*  --  29.1*  HGB 14.6 14.1 14.0 13.9  HCT 44.9 44.7 44.1 41.8  MCV 93.0 93.5 93.6 92.1  PLT 465* 359 347 391   Recent Labs  Lab 05/03/20 0247 05/04/20 0305 05/05/20 0247 05/08/20 0345 05/09/20 0315  NA 136 130* 135 138 137  K 5.0 4.4 4.4 3.9 3.5  CL 95* 92* 94* 87* 89*  CO2 28 25 29  36* 34*  GLUCOSE 333* 302* 191* 122* 110*  BUN 53* 51* 48* 39* 37*  CREATININE 1.30* 1.05 0.96 0.89 0.89  CALCIUM 8.4* 8.0* 8.2* 8.6* 8.8*  MG 2.6* 2.7*  --   --   --     F/u labs ordered  Signed, Terrilee Croak, MD [facility] 05/09/2020

## 2020-05-09 NOTE — Evaluation (Signed)
Physical Therapy Evaluation Patient Details Name: Clayton [name] MRN: [id number] DOB: 01-12-1956 Today's Date: 05/09/2020   History of Present Illness  64 yo male admitted with COVID, bil DVT. Hx of asthma, obesity, DM.  Clinical Impression  On eval, pt was Min guard assist for mobility. He was able to sit up and then scoot up towards [facility] before returning to supine. O2 94% on HHFNC + NRB at rest; 80% on HHFNC + NRB with activity. End of session: O2 levels back to 94%. Will continue to follow and progress activity as tolerated.     Follow Up Recommendations Home health PT (depending on progress-may not need)    Equipment Recommendations  None recommended by PT    Recommendations for Other Services       Precautions / Restrictions Precautions Precaution Comments: monitor O2 Restrictions Weight Bearing Restrictions: No      Mobility  Bed Mobility Overal bed mobility: Needs Assistance Bed Mobility: Supine to Sit;Sit to Supine     Supine to sit: Min guard;HOB elevated Sit to supine: Min guard;HOB elevated   General bed mobility comments: Min guard for safety, lines/leads. O2 85% on HHFNC + NRB  Transfers                 General transfer comment: Lateral scoot towards HOB prior to lying down. O2 80% on HHFNC + NRB  Ambulation/Gait                Stairs            Wheelchair Mobility    Modified Rankin (Stroke Patients Only)       Balance Overall balance assessment: Needs assistance   Sitting balance-Leahy Scale: Good                                       Pertinent Vitals/Pain Pain Assessment: No/denies pain    Home Living Family/patient expects to be discharged to:: Private residence Living Arrangements: Spouse/significant other Available Help at Discharge: Family Type of Home: House       Home Layout: Two level Home Equipment: None      Prior Function Level of Independence: Independent               Hand  Dominance        Extremity/Trunk Assessment   Upper Extremity Assessment Upper Extremity Assessment: Overall WFL for tasks assessed    Lower Extremity Assessment Lower Extremity Assessment: Generalized weakness    Cervical / Trunk Assessment Cervical / Trunk Assessment: Normal  Communication   Communication: No difficulties  Cognition Arousal/Alertness: Awake/alert Behavior During Therapy: WFL for tasks assessed/performed Overall Cognitive Status: Within Functional Limits for tasks assessed                                        General Comments      Exercises     Assessment/Plan    PT Assessment Patient needs continued PT services  PT Problem List Decreased mobility;Decreased activity tolerance       PT Treatment Interventions DME instruction;Gait training;Therapeutic activities;Therapeutic exercise;Patient/family education;Balance training;Functional mobility training    PT Goals (Current goals can be found in the Care Plan section)  Acute Rehab PT Goals Patient Stated Goal: none stated PT Goal Formulation: With patient Time For Goal Achievement: 05/23/20 Potential  to Achieve Goals: Good    Frequency Min 3X/week   Barriers to discharge        Co-evaluation               AM-PAC PT "6 Clicks" Mobility  Outcome Measure Help needed turning from your back to your side while in a flat bed without using bedrails?: A Little Help needed moving from lying on your back to sitting on the side of a flat bed without using bedrails?: A Little Help needed moving to and from a bed to a chair (including a wheelchair)?: A Little Help needed standing up from a chair using your arms (e.g., wheelchair or bedside chair)?: A Little Help needed to walk in hospital room?: A Lot Help needed climbing 3-5 steps with a railing? : A Lot 6 Click Score: 16    End of Session Equipment Utilized During Treatment: Oxygen Activity Tolerance: Patient tolerated  treatment well Patient left: in bed;with call bell/phone within reach        Time: 9983-3825 PT Time Calculation (min) (ACUTE ONLY): 19 min   Charges:   PT Evaluation $PT Eval Low Complexity: [place], PT Acute Rehabilitation  Office: [phone] Pager: [phone]

## 2020-05-10 DIAGNOSIS — J9601 Acute respiratory failure with hypoxia: Secondary | ICD-10-CM | POA: Diagnosis not present

## 2020-05-10 DIAGNOSIS — U071 COVID-19: Secondary | ICD-10-CM | POA: Diagnosis not present

## 2020-05-10 LAB — CBC WITH DIFFERENTIAL/PLATELET
Abs Immature Granulocytes: 0.25 10*3/uL — ABNORMAL HIGH (ref 0.00–0.07)
Basophils Absolute: 0 10*3/uL (ref 0.0–0.1)
Basophils Relative: 0 %
Eosinophils Absolute: 0 10*3/uL (ref 0.0–0.5)
Eosinophils Relative: 0 %
HCT: 43.8 % (ref 39.0–52.0)
Hemoglobin: 14.2 g/dL (ref 13.0–17.0)
Immature Granulocytes: 1 %
Lymphocytes Relative: 2 %
Lymphs Abs: 0.3 10*3/uL — ABNORMAL LOW (ref 0.7–4.0)
MCH: 30.2 pg (ref 26.0–34.0)
MCHC: 32.4 g/dL (ref 30.0–36.0)
MCV: 93.2 fL (ref 80.0–100.0)
Monocytes Absolute: 0.7 10*3/uL (ref 0.1–1.0)
Monocytes Relative: 3 %
Neutro Abs: 21.8 10*3/uL — ABNORMAL HIGH (ref 1.7–7.7)
Neutrophils Relative %: 94 %
Platelets: 290 10*3/uL (ref 150–400)
RBC: 4.7 MIL/uL (ref 4.22–5.81)
RDW: 12.7 % (ref 11.5–15.5)
WBC: 23.1 10*3/uL — ABNORMAL HIGH (ref 4.0–10.5)
nRBC: 0 % (ref 0.0–0.2)

## 2020-05-10 LAB — BASIC METABOLIC PANEL
Anion gap: 11 (ref 5–15)
BUN: 29 mg/dL — ABNORMAL HIGH (ref 8–23)
CO2: 32 mmol/L (ref 22–32)
Calcium: 8.3 mg/dL — ABNORMAL LOW (ref 8.9–10.3)
Chloride: 87 mmol/L — ABNORMAL LOW (ref 98–111)
Creatinine, Ser: 0.82 mg/dL (ref 0.61–1.24)
GFR calc non Af Amer: >60 mL/min (ref >60)
Glucose, Bld: 214 mg/dL — ABNORMAL HIGH (ref 70–99)
Potassium: 3.9 mmol/L (ref 3.5–5.1)
Sodium: 130 mmol/L — ABNORMAL LOW (ref 135–145)

## 2020-05-10 LAB — GLUCOSE, CAPILLARY
Glucose-Capillary: 165 mg/dL — ABNORMAL HIGH (ref 70–99)
Glucose-Capillary: 148 mg/dL — ABNORMAL HIGH (ref 70–99)
Glucose-Capillary: 82 mg/dL (ref 70–99)
Glucose-Capillary: 174 mg/dL — ABNORMAL HIGH (ref 70–99)

## 2020-05-10 LAB — C-REACTIVE PROTEIN: CRP: 4.1 mg/dL — ABNORMAL HIGH (ref <1.0)

## 2020-05-10 MED ORDER — INSULIN DETEMIR 100 UNIT/ML ~~LOC~~ SOLN
22.0000 [IU] | Freq: Two times a day (BID) | SUBCUTANEOUS | Status: DC
  Administered 2020-05-10 – 2020-05-16 (×13): 22 [IU] via SUBCUTANEOUS
  Filled 2020-05-10 (×14): qty 0.22

## 2020-05-10 MED ORDER — MAGIC MOUTHWASH
5.0000 mL | Freq: Four times a day (QID) | ORAL | Status: DC | PRN
  Administered 2020-05-10 – 2020-05-21 (×8): 5 mL via ORAL
  Filled 2020-05-10 (×10): qty 5

## 2020-05-10 MED ORDER — INSULIN ASPART 100 UNIT/ML ~~LOC~~ SOLN
8.0000 [IU] | Freq: Three times a day (TID) | SUBCUTANEOUS | Status: DC
  Administered 2020-05-10 – 2020-05-16 (×9): 8 [IU] via SUBCUTANEOUS

## 2020-05-10 NOTE — Progress Notes (Signed)
Occupational Therapy Evaluation  Patient with functional deficits listed below impacting safety and independence with self care. Patient becomes anxious with mobility and focuses more on monitor to read O2 saturations vs breathing quality. Cues to redirect and slow respiration rate. With increased time patient able to don socks using figure 4 method and maintain O2 saturations mid 80s. Patient total A for peri care after bowel movement due to limited activity tolerance, holding firmly onto chair arms for support. At start of treatment patient on 25L 60% FIO2 HHFNC and NRB with desaturation to high 70s low 80s with transfer to recliner, cues for breathing techniques due to elevated respirations/anxiety. With transfer to bedside commode NRB and HHFNC temporarily dislodged, patient desaturate to 50s, donned HHFNC/NRB and increased to 40L for recovery. Able to wean back to 25L once seated in recliner, increased time for recovery with saturations maintained 89-90%. RN made aware.    05/10/20 1503  OT Visit Information  Last OT Received On 05/10/20  Assistance Needed +1  History of Present Illness 64 yo male admitted with COVID, bil DVT. Hx of asthma, obesity, DM.  Precautions  Precautions Fall  Precaution Comments monitor O2, HHFNC 25L FIO2 60%  Restrictions  Weight Bearing Restrictions No  Home Living  Family/patient expects to be discharged to: Private residence  Living Arrangements Spouse/significant other  Available Help at Discharge Family  Type of [place] to enter  Entrance Stairs-Number of Steps 15, has chair lift  Home Layout Two level;Bed/bath upstairs  Alternate Level Stairs-Number of Steps 15 steps, has chair lift  Barrister's clerk None  Prior Function  Level of Independence Independent  Communication  Communication No difficulties  Pain Assessment  Pain Assessment No/denies pain  Cognition   Arousal/Alertness Awake/alert  Behavior During Therapy Flat affect;Anxious  Overall Cognitive Status Within Functional Limits for tasks assessed  General Comments patient states "i'm scared" and will grab onto OT's hand especially when sats drop  Upper Extremity Assessment  Upper Extremity Assessment Generalized weakness  Lower Extremity Assessment  Lower Extremity Assessment Defer to PT evaluation  ADL  Overall ADL's  Needs assistance/impaired  Grooming Sitting;Set up  Upper Body Bathing Minimal assistance;Sitting  Lower Body Bathing Moderate assistance;Sitting/lateral leans;Sit to/from stand  Upper Body Dressing  Minimal assistance;Sitting  Lower Body Dressing Moderate assistance;Sitting/lateral leans;Sit to/from stand  Lower Body Dressing Details (indicate cue type and reason) with increased time patient able to don socks with figure 4 method seated EOB, decreased balance/tolerance in standing  Toilet Transfer Minimal assistance;Stand-pivot;Cueing for safety;BSC  Toilet Transfer Details (indicate cue type and reason) poor eccentric control patient turns quickly to complete transfers, min A for safety   Toileting- Clothing Manipulation and Hygiene Total assistance;Sit to/from stand  Toileting - Clothing Manipulation Details (indicate cue type and reason) after bowel movement  Functional mobility during ADLs Minimal assistance  General ADL Comments patient requiring increased assistance with self care due to decreased strength, activity tolerance, balance, safety awareness, anxiety and cardiopulmonary status  Bed Mobility  Overal bed mobility Needs Assistance  Bed Mobility Supine to Sit  Supine to sit Min guard;HOB elevated  General bed mobility comments min guard for safety, increased time. initial desaturation to low 80s but able to recover to high 80s prior to transfer  Transfers  Overall transfer level Needs assistance  Transfers Sit to/from [employer] Transfers  Sit to  Stand Min guard  Stand pivot transfers Min assist  General transfer comment poor eccentric control into recliner and onto commode, decreased activity tolerance and stability in standing holding onto commode or recliner arms  General Comments  General comments (skin integrity, edema, etc.) patient initially on 25L 60FIO2 HHFNC and NRB with desaturation to high 70s low 80s transfer to recliner, cues for breathing techniques due to elevated respirations/anxiety. With transfer to bedside commode NRB and HHFNC temporarily dislodged, patient desaturate to 50s, donned HHFNC/NRB and increased to 40L for recovery. Able to wean back to 25L once seated in recliner and increased time for recovery with saturations maintained 89-90%. RN made aware  OT - End of Session  Equipment Utilized During Treatment Oxygen  Activity Tolerance Patient tolerated treatment well;Patient limited by fatigue  Patient left in chair;with call bell/phone within reach  Nurse Communication Mobility status;Other (comment) (O2 saturations, bowel movement)  OT Assessment  OT Recommendation/Assessment Patient needs continued OT Services  OT Visit Diagnosis Other abnormalities of gait and mobility (R26.89);Muscle weakness (generalized) (M62.81)  OT Problem List Decreased strength;Decreased activity tolerance;Impaired balance (sitting and/or standing);Decreased safety awareness;Decreased knowledge of use of DME or AE;Decreased knowledge of precautions;Cardiopulmonary status limiting activity  OT Plan  OT Frequency (ACUTE ONLY) Min 2X/week  OT Treatment/Interventions (ACUTE ONLY) Self-care/ADL training;Therapeutic exercise;Energy conservation;DME and/or AE instruction;Therapeutic activities;Patient/family education;Balance training  AM-PAC OT "6 Clicks" Daily Activity Outcome Measure (Version 2)  Help from another person eating meals? 4  Help from another person taking care of personal grooming? 3  Help from another person toileting, which  includes using toliet, bedpan, or urinal? 2  Help from another person bathing (including washing, rinsing, drying)? 2  Help from another person to put on and taking off regular upper body clothing? 3  Help from another person to put on and taking off regular lower body clothing? 2  6 Click Score 16  OT Recommendation  Follow Up Recommendations Home health OT;Supervision/Assistance - 24 hour;Other (comment) (vs no f/u pending progress)  OT Equipment Tub/shower seat  Individuals Consulted  Consulted and Agree with Results and Recommendations Patient  Acute Rehab OT Goals  Patient Stated Goal "I need to go to the bathroom"  OT Goal Formulation With patient  Time For Goal Achievement 05/24/20  Potential to Achieve Goals Good  OT Time Calculation  OT Start Time (ACUTE ONLY) 1102  OT Stop Time (ACUTE ONLY) 1155  OT Time Calculation (min) 53 min  OT General Charges  $OT Visit 1 Visit  OT Evaluation  $OT Eval Moderate Complexity 1 Mod  OT Treatments  $Self Care/Home Management  38-52 mins  Written Expression  Dominant Hand Right   Delbert Phenix OT OT pager: [phone]

## 2020-05-10 NOTE — Progress Notes (Signed)
PROGRESS NOTE  Clayton [name]  DOB: [birth year]  PCP: Kristen Loader, FNP SNK:[id number]  DOA: 04/24/2020  LOS: 16 days   Chief Complaint  Patient presents with   Code Sepsis   Shortness of Breath   Brief narrative: Clayton [name] is a 64 y.o. male with PMH of asthma, OSA not on CPAP, type II DM, obesity, depression. Patient was brought to the ED on 9/20 with complaint of cough, shortness of breath and fever of 102-104 at home. Not vaccinated against Covid.  In the ED, he was noted to be severely hypoxic to 72% on room air and required up to 15 L of oxygen by nasal cannula. COVID-19 PCR positive. Chest x-ray showed bilateral multifocal pneumonia. He was also noted to have bilateral DVT.   He was admitted to hospital service for Covid pneumonia.   Critical care consultation was obtained as well.   His oxygen requirement continued to worsen. He was treated in the ICU/SDU with heated high flow due to persistent and severe hypoxemia.   Subjective: Patient was seen and examined this morning. Lying down in bed.  Remains on 25 L/min of oxygen. Oral pain due to ulcers more tolerable with liquid diet.  Assessment/Plan: COVID pneumonia Acute respiratory failure with hypoxia  -Presented with high fever, shortness of breath -COVID test: PCR positive on admission -Chest imaging: Chest x-ray on admission showed multifocal pneumonia  -Treatment: Patient completed 5-day course of IV remdesivir as well as 14-day course of oral baricitinib.  He is currently on IV Solu-Medrol 60 mg 3 times daily. -His oxygen requirement is improving but CRP seems to be trending up again, 4.1 this morning. -Oxygen - SpO2: (!) 70 % O2 Flow Rate (L/min): 25 L/min FiO2 (%): 60 % -Continue to wean down as tolerated. -Supportive care: Vitamin C, Zinc, PRN inhalers, Tylenol, Antitussives (benzonatate/ Mucinex/Tussionex).   -Encouraged incentive spirometry, prone position, out of bed and early mobilization as  much as possible -Continue airborne/contact isolation precautions for duration of 3 weeks from the day of diagnosis. -WBC and inflammatory markers trend as below.    Lab Results  Component Value Date   SARSCOV2NAA POSITIVE (A) 04/24/2020    Recent Labs  Lab 05/04/20 0305 05/04/20 0305 05/05/20 0247 05/05/20 0247 05/05/20 1113 05/06/20 0255 05/07/20 0237 05/08/20 0345 05/09/20 0315 05/10/20 0254  WBC 28.9*  --   --   --   --   --   --  26.5* 32.8* 23.1*  PROCALCITON  --   --   --   --  <0.10  --   --   --   --   --   DDIMER 9.32*  --   --   --   --   --   --   --   --   --   CRP 2.1*   < > 1.4*   < >  --  1.0* 0.9 0.8 1.3* 4.1*  ALT 26  --  29  --   --   --   --   --   --   --    < > = values in this interval not displayed.   The treatment plan and use of medications and known side effects were discussed with patient/family. Some of the medications used are based on case reports/anecdotal data.  All other medications being used in the management of COVID-19 based on limited study data.  Complete risks and long-term side effects are unknown, however in the best clinical judgment  they seem to be of some benefit.  Patient wanted to proceed with treatment options provided.  Oral ulcers -Likely aphthous ulcers secondary to the stress of Covid pneumonia as well as dry mouth.   -Continue Magic mouthwash scheduled. -As per patient's request, I have him on a liquid diet which he is tolerating and wants to continue with the same.  OSA: Not on CPAP at home  Poorly controlled T2DM with hyperglycemia:  -HbA1c 8.4%.   -Blood sugar level still remains elevated to 150, expected to further increase with IV steroids. -Increase Levemir to 22 units twice daily, increase Premeal NovoLog to 18 units 3 times daily.  Continue linagliptin and sliding scale insulin with Accu-Cheks. Recent Labs  Lab 05/09/20 1108 05/09/20 1653 05/09/20 2107 05/10/20 0805 05/10/20 1203  GLUCAP 127* 109* 172* 165*  148*   Acute bilateral lower extremity DVTs: -Continue xarelto  Depression, anxiety:  -Continue home regimen. Continue prn ativan and morphine  Chronic diastolic CHF Essential hypertension -Echocardiogram from 9/28 showed EF of 65 to 26% grade 1 diastolic dysfunction. -Continue losartan. -HCTZ on hold.  Mobility: Encourage ambulation once oxygen level improves Code Status:   Code Status: Full Code  Nutritional status: Body mass index is 32.59 kg/m. Nutrition Problem: Increased nutrient needs Etiology: acute illness, catabolic illness (EBRAX-09 infection) Signs/Symptoms: estimated needs Diet Order            Diet full liquid Room service appropriate? Yes; Fluid consistency: Thin  Diet effective now                 DVT prophylaxis: SCDs Start: 04/24/20 1628 Rivaroxaban (XARELTO) tablet 15 mg  rivaroxaban (XARELTO) tablet 20 mg   Antimicrobials:  None Fluid: None Consultants: critical care Family Communication:  called and updated patient's wife on 10/5.  Status is: Inpatient  Remains inpatient appropriate because continues to remain dependent on heated high flow oxygen by nasal cannula  Dispo: The patient is from: Home              Anticipated d/c is to: Home              Anticipated d/c date is: > 3 days              Patient currently is not medically stable to d/c.   Infusions:    Scheduled Meds:  (feeding supplement) PROSource Plus  30 mL Oral BID BM   vitamin C  500 mg Oral Daily   benzonatate  100 mg Oral TID   Chlorhexidine Gluconate Cloth  6 each Topical Daily   feeding supplement (KATE FARMS STANDARD 1.4)  325 mL Oral BID BM   guaiFENesin  600 mg Oral BID   insulin aspart  0-20 Units Subcutaneous TID WC   insulin aspart  0-5 Units Subcutaneous QHS   insulin aspart  8 Units Subcutaneous TID WC   insulin detemir  22 Units Subcutaneous BID   Ipratropium-Albuterol  1 puff Inhalation QID   linagliptin  5 mg Oral Daily   losartan  50 mg  Oral Daily   magic mouthwash w/lidocaine  10 mL Oral QID   mouth rinse  15 mL Mouth Rinse BID   methylPREDNISolone (SOLU-MEDROL) injection  60 mg Intravenous Q8H   montelukast  10 mg Oral QHS   Rivaroxaban  15 mg Oral BID WC   Followed by   Derrill Memo ON 05/22/2020] rivaroxaban  20 mg Oral Q supper   senna-docusate  2 tablet Oral BID   sertraline  100 mg Oral Daily   zinc sulfate  220 mg Oral Daily    Antimicrobials: Anti-infectives (From admission, onward)   Start     Dose/Rate Route Frequency Ordered Stop   04/25/20 1000  remdesivir 100 mg in sodium chloride 0.9 % 100 mL IVPB        100 mg 200 mL/hr over 30 Minutes Intravenous Daily 04/24/20 1547 04/28/20 0945   04/25/20 1000  remdesivir 100 mg in sodium chloride 0.9 % 100 mL IVPB  Status:  Discontinued       "Followed by" Linked Group Details   100 mg 200 mL/hr over 30 Minutes Intravenous Daily 04/24/20 1633 04/24/20 1635   04/24/20 1645  remdesivir 200 mg in sodium chloride 0.9% 250 mL IVPB  Status:  Discontinued       "Followed by" Linked Group Details   200 mg 580 mL/hr over 30 Minutes Intravenous Once 04/24/20 1633 04/24/20 1635   04/24/20 1630  remdesivir 200 mg in sodium chloride 0.9% 250 mL IVPB        200 mg 580 mL/hr over 30 Minutes Intravenous Once 04/24/20 1547 04/24/20 1802      PRN meds: acetaminophen, albuterol, hydrALAZINE, HYDROcodone-homatropine, LORazepam, magic mouthwash, methocarbamol, metoprolol tartrate, morphine injection, oxymetazoline, sodium chloride, traMADol   Objective: Vitals:   05/10/20 1200 05/10/20 1300  BP: 120/87 118/79  Pulse: (!) 109 (!) 105  Resp: 20 (!) 28  Temp: (!) 97.5 F (36.4 C)   SpO2: 92% (!) 70%    Intake/Output Summary (Last 24 hours) at 05/10/2020 1359 Last data filed at 05/10/2020 1300 Gross per 24 hour  Intake --  Output 800 ml  Net -800 ml   Filed Weights   05/05/20 0500 05/09/20 0401 05/10/20 0459  Weight: 100.3 kg 100.1 kg 100.1 kg   Weight change: 0  kg Body mass index is 32.59 kg/m.   Physical Exam: General exam:  In respiratory distress on high flow oxygen by nasal cannula Skin: No rashes, lesions or ulcers. HEENT: Atraumatic, normocephalic, supple neck, no obvious bleeding.  Has multiple oral ulcers. Lungs: Fine Velcro-like crackles on both lungs CVS: Regular rate and rhythm with no murmur GI/Abd soft, nontender, nondistended, bowel sound present CNS: Alert, awake, oriented x3 Psychiatry: Depressed look Extremities: No pedal edema, no calf tenderness  Data Review: I have personally reviewed the laboratory data and studies available.  Recent Labs  Lab 05/04/20 0305 05/08/20 0345 05/09/20 0315 05/10/20 0254  WBC 28.9* 26.5* 32.8* 23.1*  NEUTROABS 26.0*  --  29.1* 21.8*  HGB 14.1 14.0 13.9 14.2  HCT 44.7 44.1 41.8 43.8  MCV 93.5 93.6 92.1 93.2  PLT 359 347 391 290   Recent Labs  Lab 05/04/20 0305 05/05/20 0247 05/08/20 0345 05/09/20 0315 05/10/20 0254  NA 130* 135 138 137 130*  K 4.4 4.4 3.9 3.5 3.9  CL 92* 94* 87* 89* 87*  CO2 25 29 36* 34* 32  GLUCOSE 302* 191* 122* 110* 214*  BUN 51* 48* 39* 37* 29*  CREATININE 1.05 0.96 0.89 0.89 0.82  CALCIUM 8.0* 8.2* 8.6* 8.8* 8.3*  MG 2.7*  --   --   --   --     F/u labs ordered  Signed, Terrilee Croak, MD [facility] 05/10/2020

## 2020-05-11 DIAGNOSIS — J9601 Acute respiratory failure with hypoxia: Secondary | ICD-10-CM | POA: Diagnosis not present

## 2020-05-11 DIAGNOSIS — U071 COVID-19: Secondary | ICD-10-CM | POA: Diagnosis not present

## 2020-05-11 LAB — BASIC METABOLIC PANEL
Anion gap: 10 (ref 5–15)
Anion gap: 11 (ref 5–15)
BUN: 27 mg/dL — ABNORMAL HIGH (ref 8–23)
BUN: 23 mg/dL (ref 8–23)
CO2: 33 mmol/L — ABNORMAL HIGH (ref 22–32)
CO2: 30 mmol/L (ref 22–32)
Calcium: 8.3 mg/dL — ABNORMAL LOW (ref 8.9–10.3)
Calcium: 8.2 mg/dL — ABNORMAL LOW (ref 8.9–10.3)
Chloride: 91 mmol/L — ABNORMAL LOW (ref 98–111)
Chloride: 92 mmol/L — ABNORMAL LOW (ref 98–111)
Creatinine, Ser: 0.71 mg/dL (ref 0.61–1.24)
Creatinine, Ser: 0.66 mg/dL (ref 0.61–1.24)
GFR calc non Af Amer: >60 mL/min (ref >60)
GFR calc non Af Amer: >60 mL/min (ref >60)
Glucose, Bld: 148 mg/dL — ABNORMAL HIGH (ref 70–99)
Glucose, Bld: 161 mg/dL — ABNORMAL HIGH (ref 70–99)
Potassium: 3.5 mmol/L (ref 3.5–5.1)
Potassium: 3.7 mmol/L (ref 3.5–5.1)
Sodium: 134 mmol/L — ABNORMAL LOW (ref 135–145)
Sodium: 133 mmol/L — ABNORMAL LOW (ref 135–145)

## 2020-05-11 LAB — CBC WITH DIFFERENTIAL/PLATELET
Abs Immature Granulocytes: 0.18 10*3/uL — ABNORMAL HIGH (ref 0.00–0.07)
Abs Immature Granulocytes: 0.14 10*3/uL — ABNORMAL HIGH (ref 0.00–0.07)
Basophils Absolute: 0 10*3/uL (ref 0.0–0.1)
Basophils Absolute: 0 10*3/uL (ref 0.0–0.1)
Basophils Relative: 0 %
Basophils Relative: 0 %
Eosinophils Absolute: 0 10*3/uL (ref 0.0–0.5)
Eosinophils Absolute: 0 10*3/uL (ref 0.0–0.5)
Eosinophils Relative: 0 %
Eosinophils Relative: 0 %
HCT: 40.4 % (ref 39.0–52.0)
HCT: 40.8 % (ref 39.0–52.0)
Hemoglobin: 13.3 g/dL (ref 13.0–17.0)
Hemoglobin: 13.2 g/dL (ref 13.0–17.0)
Immature Granulocytes: 1 %
Immature Granulocytes: 1 %
Lymphocytes Relative: 1 %
Lymphocytes Relative: 3 %
Lymphs Abs: 0.3 10*3/uL — ABNORMAL LOW (ref 0.7–4.0)
Lymphs Abs: 0.7 10*3/uL (ref 0.7–4.0)
MCH: 30.1 pg (ref 26.0–34.0)
MCH: 30.1 pg (ref 26.0–34.0)
MCHC: 32.9 g/dL (ref 30.0–36.0)
MCHC: 32.4 g/dL (ref 30.0–36.0)
MCV: 91.4 fL (ref 80.0–100.0)
MCV: 92.9 fL (ref 80.0–100.0)
Monocytes Absolute: 0.8 10*3/uL (ref 0.1–1.0)
Monocytes Absolute: 1 10*3/uL (ref 0.1–1.0)
Monocytes Relative: 3 %
Monocytes Relative: 4 %
Neutro Abs: 22.8 10*3/uL — ABNORMAL HIGH (ref 1.7–7.7)
Neutro Abs: 19.9 10*3/uL — ABNORMAL HIGH (ref 1.7–7.7)
Neutrophils Relative %: 95 %
Neutrophils Relative %: 92 %
Platelets: 292 10*3/uL (ref 150–400)
Platelets: 303 10*3/uL (ref 150–400)
RBC: 4.42 MIL/uL (ref 4.22–5.81)
RBC: 4.39 MIL/uL (ref 4.22–5.81)
RDW: 12.9 % (ref 11.5–15.5)
RDW: 12.9 % (ref 11.5–15.5)
WBC: 24.2 10*3/uL — ABNORMAL HIGH (ref 4.0–10.5)
WBC: 21.7 10*3/uL — ABNORMAL HIGH (ref 4.0–10.5)
nRBC: 0 % (ref 0.0–0.2)
nRBC: 0 % (ref 0.0–0.2)

## 2020-05-11 LAB — GLUCOSE, CAPILLARY
Glucose-Capillary: 129 mg/dL — ABNORMAL HIGH (ref 70–99)
Glucose-Capillary: 193 mg/dL — ABNORMAL HIGH (ref 70–99)
Glucose-Capillary: 145 mg/dL — ABNORMAL HIGH (ref 70–99)
Glucose-Capillary: 178 mg/dL — ABNORMAL HIGH (ref 70–99)

## 2020-05-11 LAB — C-REACTIVE PROTEIN: CRP: 3.2 mg/dL — ABNORMAL HIGH (ref <1.0)

## 2020-05-11 MED ORDER — POTASSIUM CHLORIDE CRYS ER 20 MEQ PO TBCR
20.0000 meq | EXTENDED_RELEASE_TABLET | Freq: Two times a day (BID) | ORAL | Status: AC
  Administered 2020-05-11 (×2): 20 meq via ORAL
  Filled 2020-05-11 (×2): qty 1

## 2020-05-11 MED ORDER — FUROSEMIDE 10 MG/ML IJ SOLN
40.0000 mg | Freq: Every day | INTRAMUSCULAR | Status: DC
  Administered 2020-05-11 – 2020-05-17 (×7): 40 mg via INTRAVENOUS
  Filled 2020-05-11 (×7): qty 4

## 2020-05-11 MED ORDER — POTASSIUM CHLORIDE CRYS ER 20 MEQ PO TBCR: 20.0000 meq | EXTENDED_RELEASE_TABLET | Freq: Two times a day (BID) | ORAL | Status: DC

## 2020-05-11 MED ORDER — TRAZODONE HCL 50 MG PO TABS
50.0000 mg | ORAL_TABLET | Freq: Once | ORAL | Status: AC
  Administered 2020-05-11: 50 mg via ORAL
  Filled 2020-05-11: qty 1

## 2020-05-11 NOTE — Progress Notes (Signed)
Nutrition Follow-up  DOCUMENTATION CODES:   Obesity unspecified  INTERVENTION:   -30 ml Prosource Plus BID, each supplement provides 100 kcal and 15 grams protein.  -D/c Dillard [place] -stopped drinking -Magic cup TID with meals, each supplement provides 290 kcal and 9 grams of protein  NUTRITION DIAGNOSIS:   Increased nutrient needs related to acute illness, catabolic illness (VWPVX-48 infection) as evidenced by estimated needs.  Ongoing.  GOAL:   Patient will meet greater than or equal to 90% of their needs  Progressing.  MONITOR:   PO intake, Supplement acceptance, Labs, Weight trends  ASSESSMENT:   63 y.o. male with medical history of asthma, obesity, sleep apnea, and type 2 DM. He was admitted with severe shortness of breath, cough, and fevers of 102-104 at home. His symptoms began on 9/13. His wife and mother-in-law also presented at the same time as him for similar symptoms. He is not vaccinated against COVID-19. He was found to be COVID positive in the ED.  -COVID-19+ since 9/20.  Patient currently on full liquid per request given painful mouth ulcers. Pt not accepting [employer] any longer. Will d/c and order Magic cups with meals and continue Prosource for added protein.  Admission weight: 238 lbs.   Medications: Vitamin C, Magic Mouthwash w/ lidocaine, KLOR-CON, Senokot, Zinc sulfate  Labs reviewed:  CBGs: 129-193 Low Na  Diet Order:   Diet Order            Diet full liquid Room service appropriate? Yes; Fluid consistency: Thin  Diet effective now                 EDUCATION NEEDS:   No education needs have been identified at this time  Skin:  Skin Assessment: Reviewed RN Assessment  Last BM:  9/29 (type 2)  Height:   Ht Readings from Last 1 Encounters:  04/24/20 5\' 9"  (1.753 m)    Weight:   Wt Readings from Last 1 Encounters:  05/11/20 100.1 kg   BMI:  Body mass index is 32.59 kg/m.  Estimated Nutritional Needs:   Kcal:  0165-5374  kcal (20-22 kcal/kg adjBW)  Protein:  105-115 grams  Fluid:  >/= 2 L/day   Clayton Bibles, MS, RD, LDN Inpatient Clinical Dietitian Contact information available via Amion

## 2020-05-11 NOTE — Progress Notes (Addendum)
PROGRESS NOTE  [name]  DOB: 1956-03-11  PCP: Kristen Loader, FNP VOH:[id number]  DOA: 04/24/2020  LOS: 17 days   Chief Complaint  Patient presents with  . Code Sepsis  . Shortness of Breath   Brief narrative: Clayton [name] is a 64 y.o. male with PMH of asthma, OSA not on CPAP, type II DM, obesity, depression. Patient was brought to the ED on 9/20 with complaint of cough, shortness of breath and fever of 102-104 at home. Not vaccinated against Covid.  In the ED, he was noted to be severely hypoxic to 72% on room air and required up to 15 L of oxygen by nasal cannula. COVID-19 PCR positive. Chest x-ray showed bilateral multifocal pneumonia. He was also noted to have bilateral DVT.   He was admitted to hospital service for Covid pneumonia.   Critical care consultation was obtained as well.   His oxygen requirement continued to worsen. He was treated in the ICU/SDU with heated high flow due to persistent and severe hypoxemia.   Subjective: Patient was seen and examined this morning. Patient is frustrated because of slow recovery.  He actually is requiring more oxygen this morning than yesterday. Oral ulcers seems to be slowly getting better.  Patient prefers to remain on full liquid diet.  Assessment/Plan: COVID pneumonia Acute respiratory failure with hypoxia  -Presented with high fever, shortness of breath -COVID test: PCR positive on admission -Chest imaging: Chest x-ray on admission showed multifocal pneumonia  -Treatment: Patient completed 5-day course of IV remdesivir as well as 14-day course of oral baricitinib.  He is currently on IV Solu-Medrol 60 mg 3 times daily. -CRP is now trending down but patient is requiring higher flow of oxygen this morning at 4 L/min.  Wean down as tolerated to titrate oxygen saturation more than 85%.   -Oxygen - SpO2: 95 % O2 Flow Rate (L/min): 40 L/min FiO2 (%): 60 %  -I will start the patient on Lasix 40 mg daily  today. -Supportive care: Vitamin C, Zinc, PRN inhalers, Tylenol, Antitussives (benzonatate/ Mucinex/Tussionex).   -Encouraged incentive spirometry, prone position, out of bed and early mobilization as much as possible -Continue airborne/contact isolation precautions for duration of 3 weeks from the day of diagnosis. -WBC and inflammatory markers trend as below.    Lab Results  Component Value Date   SARSCOV2NAA POSITIVE (A) 04/24/2020    Recent Labs  Lab 05/05/20 0247 05/05/20 1113 05/06/20 0255 05/07/20 0237 05/08/20 0345 05/09/20 0315 05/10/20 0254 05/11/20 0237  WBC  --   --   --   --  26.5* 32.8* 23.1* 24.2*  PROCALCITON  --  <0.10  --   --   --   --   --   --   CRP 1.4*  --    < > 0.9 0.8 1.3* 4.1* 3.2*  ALT 29  --   --   --   --   --   --   --    < > = values in this interval not displayed.   The treatment plan and use of medications and known side effects were discussed with patient/family. Some of the medications used are based on case reports/anecdotal data.  All other medications being used in the management of COVID-19 based on limited study data.  Complete risks and long-term side effects are unknown, however in the best clinical judgment they seem to be of some benefit.  Patient wanted to proceed with treatment options provided.  Oral ulcers -Likely  aphthous ulcers secondary to the stress of Covid pneumonia as well as dry mouth.   -Continue Magic mouthwash scheduled. -As per patient's request, I have him on a liquid diet which he is tolerating and wants to continue with the same.  OSA -Not on CPAP at home  Poorly controlled T2DM Hyperglycemia -HbA1c 8.4%.   -Blood sugar level still remains elevated due to IV steroids. -Continue Levemir at 22 units twice daily, continue Premeal NovoLog to 8 units 3 times daily. Continue linagliptin and sliding scale insulin with Accu-Cheks. Recent Labs  Lab 05/10/20 1203 05/10/20 1527 05/10/20 2010 05/11/20 0747  05/11/20 1114  GLUCAP 148* 82 174* 129* 193*   Acute bilateral lower extremity DVTs: -Continue xarelto  Depression, anxiety:  -Continue home regimen. Continue prn ativan and morphine.  Chronic diastolic CHF Essential hypertension -Echocardiogram from 9/28 showed EF of 65 to 35% grade 1 diastolic dysfunction. -Continue losartan. -HCTZ on hold.  Mobility: Encourage ambulation once oxygen level improves Code Status:   Code Status: Full Code  Nutritional status: Body mass index is 32.59 kg/m. Nutrition Problem: Increased nutrient needs Etiology: acute illness, catabolic illness (TIRWE-31 infection) Signs/Symptoms: estimated needs Diet Order            Diet full liquid Room service appropriate? Yes; Fluid consistency: Thin  Diet effective now                 DVT prophylaxis: SCDs Start: 04/24/20 1628 Rivaroxaban (XARELTO) tablet 15 mg  rivaroxaban (XARELTO) tablet 20 mg   Antimicrobials:  None Fluid: None Consultants: critical care Family Communication:  called and updated patient's wife  Status is: Inpatient  Remains inpatient appropriate because continues to remain dependent on heated high flow oxygen by nasal cannula  Dispo: The patient is from: Home              Anticipated d/c is to: Home              Anticipated d/c date is: > 3 days              Patient currently is not medically stable to d/c.   Infusions:    Scheduled Meds: . (feeding supplement) PROSource Plus  30 mL Oral BID BM  . vitamin C  500 mg Oral Daily  . benzonatate  100 mg Oral TID  . Chlorhexidine Gluconate Cloth  6 each Topical Daily  . furosemide  40 mg Intravenous Daily  . guaiFENesin  600 mg Oral BID  . insulin aspart  0-20 Units Subcutaneous TID WC  . insulin aspart  0-5 Units Subcutaneous QHS  . insulin aspart  8 Units Subcutaneous TID WC  . insulin detemir  22 Units Subcutaneous BID  . Ipratropium-Albuterol  1 puff Inhalation QID  . linagliptin  5 mg Oral Daily  . losartan   50 mg Oral Daily  . magic mouthwash w/lidocaine  10 mL Oral QID  . mouth rinse  15 mL Mouth Rinse BID  . methylPREDNISolone (SOLU-MEDROL) injection  60 mg Intravenous Q8H  . montelukast  10 mg Oral QHS  . potassium chloride  20 mEq Oral BID  . Rivaroxaban  15 mg Oral BID WC   Followed by  . [START ON 05/22/2020] rivaroxaban  20 mg Oral Q supper  . senna-docusate  2 tablet Oral BID  . sertraline  100 mg Oral Daily  . zinc sulfate  220 mg Oral Daily    Antimicrobials: Anti-infectives (From admission, onward)   Start  Dose/Rate Route Frequency Ordered Stop   04/25/20 1000  remdesivir 100 mg in sodium chloride 0.9 % 100 mL IVPB        100 mg 200 mL/hr over 30 Minutes Intravenous Daily 04/24/20 1547 04/28/20 0945   04/25/20 1000  remdesivir 100 mg in sodium chloride 0.9 % 100 mL IVPB  Status:  Discontinued       "Followed by" Linked Group Details   100 mg 200 mL/hr over 30 Minutes Intravenous Daily 04/24/20 1633 04/24/20 1635   04/24/20 1645  remdesivir 200 mg in sodium chloride 0.9% 250 mL IVPB  Status:  Discontinued       "Followed by" Linked Group Details   200 mg 580 mL/hr over 30 Minutes Intravenous Once 04/24/20 1633 04/24/20 1635   04/24/20 1630  remdesivir 200 mg in sodium chloride 0.9% 250 mL IVPB        200 mg 580 mL/hr over 30 Minutes Intravenous Once 04/24/20 1547 04/24/20 1802      PRN meds: acetaminophen, albuterol, hydrALAZINE, HYDROcodone-homatropine, LORazepam, magic mouthwash, methocarbamol, metoprolol tartrate, morphine injection, oxymetazoline, sodium chloride, traMADol   Objective: Vitals:   05/11/20 1100 05/11/20 1200  BP: (!) 107/59 128/63  Pulse: 77 75  Resp: (!) 21 20  Temp:  (!) 97.2 F (36.2 C)  SpO2: 92% 95%    Intake/Output Summary (Last 24 hours) at 05/11/2020 1312 Last data filed at 05/11/2020 0600 Gross per 24 hour  Intake --  Output 1375 ml  Net -1375 ml   Filed Weights   05/09/20 0401 05/10/20 0459 05/11/20 0456  Weight: 100.1 kg  100.1 kg 100.1 kg   Weight change: 0 kg Body mass index is 32.59 kg/m.   Physical Exam: General exam: Not in physical distress. Skin: No rashes, lesions or ulcers. HEENT: Atraumatic, normocephalic, supple neck, no obvious bleeding.  Has multiple oral ulcers.  Has high flow nasal cannula and nonrebreather mask on. Lungs: Fine Velcro-like crackles on both lungs CVS: Regular rate and rhythm with no murmur GI/Abd soft, nontender, nondistended, bowel sound present CNS: Alert, awake, oriented x3 Psychiatry: Depressed look Extremities: No pedal edema, no calf tenderness  Data Review: I have personally reviewed the laboratory data and studies available.  Recent Labs  Lab 05/08/20 0345 05/09/20 0315 05/10/20 0254 05/11/20 0237  WBC 26.5* 32.8* 23.1* 24.2*  NEUTROABS  --  29.1* 21.8* 22.8*  HGB 14.0 13.9 14.2 13.3  HCT 44.1 41.8 43.8 40.4  MCV 93.6 92.1 93.2 91.4  PLT 347 391 290 292   Recent Labs  Lab 05/05/20 0247 05/08/20 0345 05/09/20 0315 05/10/20 0254 05/11/20 0237  NA 135 138 137 130* 134*  K 4.4 3.9 3.5 3.9 3.5  CL 94* 87* 89* 87* 91*  CO2 29 36* 34* 32 33*  GLUCOSE 191* 122* 110* 214* 148*  BUN 48* 39* 37* 29* 27*  CREATININE 0.96 0.89 0.89 0.82 0.71  CALCIUM 8.2* 8.6* 8.8* 8.3* 8.3*    F/u labs ordered  Signed, Terrilee Croak, MD [facility] 05/11/2020

## 2020-05-11 NOTE — Progress Notes (Signed)
PT Cancellation Note  Patient Details Name: Clayton [name] MRN: [id number] DOB: Jan 30, 1956   Cancelled Treatment:     PT attempted but deferred at pt request.  Pt telling RN too fatigued.  Will follow.   Tiersa Dayley 05/11/2020, 2:55 PM

## 2020-05-11 NOTE — TOC Progression Note (Signed)
Transition of Care [facility]) - Progression Note    Patient Details  Name: Clayton [name] MRN: [id number] Date of Birth: 1956/01/21  Transition of Care [facility]) CM/SW Contact  Leeroy Cha, RN Phone Number: 05/11/2020, 9:40 AM  Clinical Narrative:    Assessment/Plan: COVID pneumonia Acute respiratory failure with hypoxia  -Presented with high fever, shortness of breath -COVID test: PCR positive on admission -Chest imaging: Chest x-ray on admission showed multifocal pneumonia  -Treatment: Patient completed 5-day course of IV remdesivir as well as 14-day course of oral baricitinib.  He is currently on IV Solu-Medrol 60 mg 3 times daily. -His oxygen requirement is improving but CRP seems to be trending up again, 4.1 this morning. -Oxygen - SpO2: (!) 70 % O2 Flow Rate (L/min): 25 L/min FiO2 (%): 60 % -Continue to wean down as tolerated. -Supportive care: Vitamin C, Zinc, PRN inhalers, Tylenol, Antitussives (benzonatate/ Mucinex/Tussionex).   -Encouraged incentive spirometry, prone position, out of bed and early mobilization as much as possible -Continue airborne/contact isolation precautions for duration of 3 weeks from the day of diagnosis. -WBC and inflammatory markers trend as below.   Iv solu medrol, hfnrb at 60%, crp-3.2, wbc 24.2, Plan from home Following for progression.  Still having high o2 needs due to covid.  Expected Discharge Plan: Home/Self Care Barriers to Discharge: Continued Medical Work up  Expected Discharge Plan and Services Expected Discharge Plan: Home/Self Care   Discharge Planning Services: CM Consult   Living arrangements for the past 2 months: Single Family Home                                       Social Determinants of Health (SDOH) Interventions    Readmission Risk Interventions No flowsheet data found.

## 2020-05-12 ENCOUNTER — Inpatient Hospital Stay (HOSPITAL_COMMUNITY): Payer: BC Managed Care – PPO

## 2020-05-12 DIAGNOSIS — J9601 Acute respiratory failure with hypoxia: Secondary | ICD-10-CM | POA: Diagnosis not present

## 2020-05-12 DIAGNOSIS — U071 COVID-19: Secondary | ICD-10-CM | POA: Diagnosis not present

## 2020-05-12 LAB — GLUCOSE, CAPILLARY
Glucose-Capillary: 159 mg/dL — ABNORMAL HIGH (ref 70–99)
Glucose-Capillary: 191 mg/dL — ABNORMAL HIGH (ref 70–99)

## 2020-05-12 LAB — BASIC METABOLIC PANEL
Anion gap: 14 (ref 5–15)
BUN: 23 mg/dL (ref 8–23)
CO2: 32 mmol/L (ref 22–32)
Calcium: 8.5 mg/dL — ABNORMAL LOW (ref 8.9–10.3)
Chloride: 89 mmol/L — ABNORMAL LOW (ref 98–111)
Creatinine, Ser: 0.72 mg/dL (ref 0.61–1.24)
GFR calc non Af Amer: >60 mL/min (ref >60)
Glucose, Bld: 138 mg/dL — ABNORMAL HIGH (ref 70–99)
Potassium: 4.5 mmol/L (ref 3.5–5.1)
Sodium: 135 mmol/L (ref 135–145)

## 2020-05-12 LAB — MAGNESIUM: Magnesium: 2.6 mg/dL — ABNORMAL HIGH (ref 1.7–2.4)

## 2020-05-12 LAB — PHOSPHORUS: Phosphorus: 3.4 mg/dL (ref 2.5–4.6)

## 2020-05-12 LAB — C-REACTIVE PROTEIN: CRP: 6.4 mg/dL — ABNORMAL HIGH (ref <1.0)

## 2020-05-12 MED ORDER — NYSTATIN 100000 UNIT/ML MT SUSP
5.0000 mL | Freq: Four times a day (QID) | OROMUCOSAL | Status: DC
  Administered 2020-05-12 – 2020-05-30 (×63): 500000 [IU] via ORAL
  Filled 2020-05-12 (×64): qty 5

## 2020-05-12 NOTE — Progress Notes (Signed)
Physical Therapy Treatment Patient Details Name: Clayton [name] MRN: [id number] DOB: 10/02/1955 Today's Date: 05/12/2020    History of Present Illness 64 yo male admitted with COVID, bil DVT. Hx of asthma, obesity, DM.    PT Comments    The patient resting in bed on HHFNC 30 L and 50% and partial,NRB, SPO2 92 %. Patient  Required much encouragement to mobilize to sitting, requiring min assistance. Patient anxious, asking for more oxygen. Attempted to stand x 1, unsuccessful and patient returned self to supine. SPO2 dropping into 70's with slow rise back to 85%. RR reached 41 when in respiratory distress. Patient's progress remains very slow. Continue PT for mobility as tolerated.  Follow Up Recommendations  Home health PT;Supervision/Assistance - 24 hour     Equipment Recommendations  None recommended by PT    Recommendations for Other Services       Precautions / Restrictions Precautions Precautions: Fall Precaution Comments: monitor O2, HHFNC 25L FIO2 60%    Mobility  Bed Mobility   Bed Mobility: Supine to Sit     Supine to sit: Min assist     General bed mobility comments: pt. reaching out for a hand to assist him to sit up  Transfers Overall transfer level: Needs assistance Equipment used: Rolling walker (2 wheeled)             General transfer comment: after sitting on bed edge for ` 2  minute, attempted to stand at RW, patient did not clear his buttocks and scrambled himself back into supine. Patient on NRB and HHFNC 30Land 50%.  Ambulation/Gait             General Gait Details: unable   Stairs             Wheelchair Mobility    Modified Rankin (Stroke Patients Only)       Balance Overall balance assessment: Needs assistance   Sitting balance-Leahy Scale: Fair Sitting balance - Comments: leaning forward, propped on arms                                    Cognition Arousal/Alertness: Awake/alert Behavior During  Therapy: Flat affect;Restless;Anxious                                   General Comments: patient states "i'm scared", I need oxygen      Exercises      General Comments        Pertinent Vitals/Pain Pain Assessment: No/denies pain    Home Living                      Prior Function            PT Goals (current goals can now be found in the care plan section) Progress towards PT goals: Not progressing toward goals - comment (remains on a lot of O2 and has minimally improved in mobility)    Frequency    Min 3X/week      PT Plan Current plan remains appropriate    Co-evaluation PT/OT/SLP Co-Evaluation/Treatment: Yes Reason for Co-Treatment: Complexity of the patient's impairments (multi-system involvement) PT goals addressed during session: Mobility/safety with mobility        AM-PAC PT "6 Clicks" Mobility   Outcome Measure  Help needed turning from your back to your side while  in a flat bed without using bedrails?: A Little Help needed moving from lying on your back to sitting on the side of a flat bed without using bedrails?: A Little Help needed moving to and from a bed to a chair (including a wheelchair)?: A Lot Help needed standing up from a chair using your arms (e.g., wheelchair or bedside chair)?: A Lot Help needed to walk in hospital room?: Total   6 Click Score: 11    End of Session Equipment Utilized During Treatment: Oxygen Activity Tolerance: Treatment limited secondary to medical complications (Comment) Patient left: in bed;with call bell/phone within reach;with bed alarm set Nurse Communication: Mobility status PT Visit Diagnosis: Difficulty in walking, not elsewhere classified (R26.2)     Time: 1025-1050 PT Time Calculation (min) (ACUTE ONLY): 25 min  Charges:  $Therapeutic Activity: 8-22 mins                     [place] Pager [phone] Office [phone]    Claretha Cooper 05/12/2020, 1:32 PM

## 2020-05-12 NOTE — Progress Notes (Signed)
Occupational Therapy Progress Note  Patient with limited activity tolerance, requires max encouragement to try sitting up at edge of bed. Patient desat to mid 40s on 30L 50% FIO2 HHFNC and NRB, max cues to slow breathing rate. Attempt to stand with assist x2 from EOB however patient unable to clear hips, then quickly returned himself to semi-supine. Patient becomes very anxious with all mobility, states "I was scared" and fixates on O2 readings. Will continue to follow.    05/12/20 1341  OT Visit Information  Last OT Received On 05/12/20  Assistance Needed +2  PT/OT/SLP Co-Evaluation/Treatment Yes  Reason for Co-Treatment For patient/therapist safety;To address functional/ADL transfers  OT goals addressed during session ADL's and self-care  History of Present Illness 64 yo male admitted with COVID, bil DVT. Hx of asthma, obesity, DM.  Precautions  Precautions Fall  Precaution Comments monitor O2, HHFNC 30L FIO2 50%  Pain Assessment  Pain Assessment No/denies pain  Cognition  Arousal/Alertness Awake/alert  Behavior During Therapy Flat affect;Restless;Anxious  Overall Cognitive Status Within Functional Limits for tasks assessed  ADL  Overall ADL's  Needs assistance/impaired  Eating/Feeding Set up;Bed level  Eating/Feeding Details (indicate cue type and reason) drink from cup  Bed Mobility  Overal bed mobility Needs Assistance  Bed Mobility Supine to Sit;Sit to Supine  Supine to sit Min assist  Sit to supine Min guard;HOB elevated  General bed mobility comments pt. reaching out for a hand to assist him to sit up  Balance  Overall balance assessment Needs assistance  Sitting balance-Leahy Scale Fair  Sitting balance - Comments leaning forward, propped on arms  Transfers  Overall transfer level Needs assistance  Equipment used Rolling walker (2 wheeled)  Transfers Sit to/from Stand  Sit to Stand  (unable this session)  General transfer comment after sitting on bed edge for ` 2   minute, attempted to stand at [facility], patient did not clear his buttocks and scrambled himself back into supine. Patient on NRB and HHFNC 30Land 50%.  OT - End of Session  Equipment Utilized During Treatment Oxygen  Activity Tolerance Patient limited by fatigue  Patient left in bed;with call bell/phone within reach  Nurse Communication Mobility status  OT Assessment/Plan  OT Plan Discharge plan remains appropriate  OT Visit Diagnosis Other abnormalities of gait and mobility (R26.89);Muscle weakness (generalized) (M62.81)  OT Frequency (ACUTE ONLY) Min 2X/week  Follow Up Recommendations Home health OT;Supervision/Assistance - 24 hour (vs no f/u pending progress)  OT Equipment Tub/shower seat  AM-PAC OT "6 Clicks" Daily Activity Outcome Measure (Version 2)  Help from another person eating meals? 3  Help from another person taking care of personal grooming? 3  Help from another person toileting, which includes using toliet, bedpan, or urinal? 2  Help from another person bathing (including washing, rinsing, drying)? 2  Help from another person to put on and taking off regular upper body clothing? 3  Help from another person to put on and taking off regular lower body clothing? 2  6 Click Score 15  OT Goal Progression  Progress towards OT goals Progressing toward goals  Acute Rehab OT Goals  Patient Stated Goal "I'm tired"  OT Goal Formulation With patient  Time For Goal Achievement 05/24/20  Potential to Achieve Goals Good  OT Time Calculation  OT Start Time (ACUTE ONLY) 1022  OT Stop Time (ACUTE ONLY) 1052  OT Time Calculation (min) 30 min  OT General Charges  $OT Visit 1 Visit  OT Treatments  $Self Care/Home  Management  8-22 mins   Delbert Phenix OT OT pager: [phone]

## 2020-05-12 NOTE — Progress Notes (Signed)
PROGRESS NOTE  [name]  DOB: [birth year]  PCP: Kristen Loader, FNP IRW:[id number]  DOA: 04/24/2020  LOS: 18 days   Chief Complaint  Patient presents with  . Code Sepsis  . Shortness of Breath   Brief narrative: Clayton [name] is a 64 y.o. male with PMH of asthma, OSA not on CPAP, type II DM, obesity, depression. Patient was brought to the ED on 9/20 with complaint of cough, shortness of breath and fever of 102-104 at home. Not vaccinated against Covid.  In the ED, he was noted to be severely hypoxic to 72% on room air and required up to 15 L of oxygen by nasal cannula. COVID-19 PCR positive. Chest x-ray showed bilateral multifocal pneumonia. He was also noted to have bilateral DVT.   He was admitted to hospital service for Covid pneumonia.   Critical care consultation was obtained as well.   His oxygen requirement continued to worsen. He was treated in the ICU/SDU with heated high flow due to persistent and severe hypoxemia.   Subjective: Patient was seen and examined this morning. Patient said he is tired. Oral ulcers started hurting more. Not even able to take a sip of water. Remains on 4 L oxygen by nasal cannula with 90% nonrebreather.  Assessment/Plan: COVID pneumonia Acute respiratory failure with hypoxia  -Presented with high fever, shortness of breath -COVID test: PCR positive on admission -Chest imaging: Chest x-ray on admission showed multifocal pneumonia  -Treatment: Patient completed 5-day course of IV remdesivir as well as 14-day course of oral baricitinib.  He is currently on high-dose of IV Solu-Medrol at 60 mg 3 times daily. -Continues to require heated high flow oxygen by nasal cannula at 40 L/min. CRP trending up. -On IV Lasix since 10/7. -Chest x-ray repeated this morning showed low volume film with persistent diffuse bilateral airspace disease..   -Encouraged incentive spirometry, prone position, out of bed and early mobilization as much as  possible -Continue airborne/contact isolation precautions for duration of 3 weeks from the day of diagnosis. -WBC and inflammatory markers trend as below.    Lab Results  Component Value Date   SARSCOV2NAA POSITIVE (A) 04/24/2020    Recent Labs  Lab 05/08/20 0345 05/09/20 0315 05/10/20 0254 05/11/20 0237 05/11/20 1446 05/12/20 0300  WBC 26.5* 32.8* 23.1* 24.2* 21.7*  --   CRP 0.8 1.3* 4.1* 3.2*  --  6.4*   The treatment plan and use of medications and known side effects were discussed with patient/family. Some of the medications used are based on case reports/anecdotal data.  All other medications being used in the management of COVID-19 based on limited study data.  Complete risks and long-term side effects are unknown, however in the best clinical judgment they seem to be of some benefit.  Patient wanted to proceed with treatment options provided.  Oral ulcers -Likely aphthous ulcers secondary to the stress of Covid pneumonia as well as dry mouth.   -On examination, oral ulcer seems to be getting better but patient continues to feel severe pain. Now is not even able to tolerate sip of water. Continue Magic mouthwash. I would also presumptively start him on nystatin suspension.   OSA -Not on CPAP at home  Poorly controlled T2DM Hyperglycemia -HbA1c 8.4%.   -Blood sugar level still remains elevated due to IV steroids. -Continue Levemir at 22 units twice daily, continue Premeal NovoLog to 8 units 3 times daily. Continue linagliptin and sliding scale insulin with Accu-Cheks. Recent Labs  Lab 05/11/20 1114 05/11/20  1633 05/11/20 2153 05/12/20 0821 05/12/20 1133  GLUCAP 193* 145* 178* 159* 191*   Acute bilateral lower extremity DVTs: -Continue xarelto  Depression, anxiety:  -Continue home regimen. Continue prn ativan and morphine.  Chronic diastolic CHF Essential hypertension -Echocardiogram from 9/28 showed EF of 65 to 73% grade 1 diastolic dysfunction. -On IV  Lasix 40 mg daily.  Continue losartan if able to take oral tablets. -HCTZ on hold. -IV hydralazine as needed.  Mobility: Encourage ambulation once oxygen level improves Code Status:   Code Status: Full Code  Nutritional status: Body mass index is 32.59 kg/m. Nutrition Problem: Increased nutrient needs Etiology: acute illness, catabolic illness (XTGGY-69 infection) Signs/Symptoms: estimated needs Diet Order            Diet full liquid Room service appropriate? Yes; Fluid consistency: Thin  Diet effective now                 DVT prophylaxis: SCDs Start: 04/24/20 1628 Rivaroxaban (XARELTO) tablet 15 mg  rivaroxaban (XARELTO) tablet 20 mg   Antimicrobials:  None Fluid: None Consultants: critical care Family Communication:  called and updated patient's wife  Status is: Inpatient  Remains inpatient appropriate because continues to remain dependent on heated high flow oxygen by nasal cannula  Dispo: The patient is from: Home              Anticipated d/c is to: Home              Anticipated d/c date is: > 3 days              Patient currently is not medically stable to d/c.   Infusions:    Scheduled Meds: . (feeding supplement) PROSource Plus  30 mL Oral BID BM  . vitamin C  500 mg Oral Daily  . benzonatate  100 mg Oral TID  . Chlorhexidine Gluconate Cloth  6 each Topical Daily  . furosemide  40 mg Intravenous Daily  . guaiFENesin  600 mg Oral BID  . insulin aspart  0-20 Units Subcutaneous TID WC  . insulin aspart  0-5 Units Subcutaneous QHS  . insulin aspart  8 Units Subcutaneous TID WC  . insulin detemir  22 Units Subcutaneous BID  . Ipratropium-Albuterol  1 puff Inhalation QID  . linagliptin  5 mg Oral Daily  . losartan  50 mg Oral Daily  . magic mouthwash w/lidocaine  10 mL Oral QID  . mouth rinse  15 mL Mouth Rinse BID  . methylPREDNISolone (SOLU-MEDROL) injection  60 mg Intravenous Q8H  . montelukast  10 mg Oral QHS  . nystatin  5 mL Oral QID  . Rivaroxaban   15 mg Oral BID WC   Followed by  . [START ON 05/22/2020] rivaroxaban  20 mg Oral Q supper  . senna-docusate  2 tablet Oral BID  . sertraline  100 mg Oral Daily  . zinc sulfate  220 mg Oral Daily    Antimicrobials: Anti-infectives (From admission, onward)   Start     Dose/Rate Route Frequency Ordered Stop   04/25/20 1000  remdesivir 100 mg in sodium chloride 0.9 % 100 mL IVPB        100 mg 200 mL/hr over 30 Minutes Intravenous Daily 04/24/20 1547 04/28/20 0945   04/25/20 1000  remdesivir 100 mg in sodium chloride 0.9 % 100 mL IVPB  Status:  Discontinued       "Followed by" Linked Group Details   100 mg 200 mL/hr over 30 Minutes Intravenous  Daily 04/24/20 1633 04/24/20 1635   04/24/20 1645  remdesivir 200 mg in sodium chloride 0.9% 250 mL IVPB  Status:  Discontinued       "Followed by" Linked Group Details   200 mg 580 mL/hr over 30 Minutes Intravenous Once 04/24/20 1633 04/24/20 1635   04/24/20 1630  remdesivir 200 mg in sodium chloride 0.9% 250 mL IVPB        200 mg 580 mL/hr over 30 Minutes Intravenous Once 04/24/20 1547 04/24/20 1802      PRN meds: acetaminophen, albuterol, hydrALAZINE, HYDROcodone-homatropine, LORazepam, magic mouthwash, methocarbamol, metoprolol tartrate, morphine injection, oxymetazoline, sodium chloride, traMADol   Objective: Vitals:   05/12/20 0848 05/12/20 0908  BP:  130/73  Pulse: (!) 101 86  Resp:  (!) 30  Temp:    SpO2: 93% 92%    Intake/Output Summary (Last 24 hours) at 05/12/2020 1144 Last data filed at 05/11/2020 1600 Gross per 24 hour  Intake --  Output 1050 ml  Net -1050 ml   Filed Weights   05/10/20 0459 05/11/20 0456 05/12/20 0437  Weight: 100.1 kg 100.1 kg 100.1 kg   Weight change: 0 kg Body mass index is 32.59 kg/m.   Physical Exam: General exam: In distress because of oral ulcer pain Skin: No rashes, lesions or ulcers. HEENT: Atraumatic, normocephalic, supple neck, no obvious bleeding.  Has multiple painful oral ulcers,  seems to be crusting now.  Has high flow nasal cannula and nonrebreather mask on. Lungs: Fine Velcro-like crackles on both lungs CVS: Regular rate and rhythm with no murmur GI/Abd soft, nontender, nondistended, bowel sound present CNS: Alert, awake, oriented x3 Psychiatry: Depressed look Extremities: No pedal edema, no calf tenderness  Data Review: I have personally reviewed the laboratory data and studies available.  Recent Labs  Lab 05/08/20 0345 05/09/20 0315 05/10/20 0254 05/11/20 0237 05/11/20 1446  WBC 26.5* 32.8* 23.1* 24.2* 21.7*  NEUTROABS  --  29.1* 21.8* 22.8* 19.9*  HGB 14.0 13.9 14.2 13.3 13.2  HCT 44.1 41.8 43.8 40.4 40.8  MCV 93.6 92.1 93.2 91.4 92.9  PLT 347 391 290 292 303   Recent Labs  Lab 05/09/20 0315 05/10/20 0254 05/11/20 0237 05/11/20 1446 05/12/20 0255  NA 137 130* 134* 133* 135  K 3.5 3.9 3.5 3.7 4.5  CL 89* 87* 91* 92* 89*  CO2 34* 32 33* 30 32  GLUCOSE 110* 214* 148* 161* 138*  BUN 37* 29* 27* 23 23  CREATININE 0.89 0.82 0.71 0.66 0.72  CALCIUM 8.8* 8.3* 8.3* 8.2* 8.5*  MG  --   --   --   --  2.6*  PHOS  --   --   --   --  3.4    F/u labs ordered  Signed, Terrilee Croak, MD [facility] 05/12/2020

## 2020-05-13 DIAGNOSIS — J9601 Acute respiratory failure with hypoxia: Secondary | ICD-10-CM | POA: Diagnosis not present

## 2020-05-13 DIAGNOSIS — U071 COVID-19: Secondary | ICD-10-CM | POA: Diagnosis not present

## 2020-05-13 LAB — BASIC METABOLIC PANEL
Anion gap: 12 (ref 5–15)
BUN: 21 mg/dL (ref 8–23)
CO2: 33 mmol/L — ABNORMAL HIGH (ref 22–32)
Calcium: 8.6 mg/dL — ABNORMAL LOW (ref 8.9–10.3)
Chloride: 88 mmol/L — ABNORMAL LOW (ref 98–111)
Creatinine, Ser: 0.55 mg/dL — ABNORMAL LOW (ref 0.61–1.24)
GFR, Estimated: >60 mL/min (ref >60)
Glucose, Bld: 132 mg/dL — ABNORMAL HIGH (ref 70–99)
Potassium: 3.9 mmol/L (ref 3.5–5.1)
Sodium: 133 mmol/L — ABNORMAL LOW (ref 135–145)

## 2020-05-13 LAB — GLUCOSE, CAPILLARY
Glucose-Capillary: 155 mg/dL — ABNORMAL HIGH (ref 70–99)
Glucose-Capillary: 187 mg/dL — ABNORMAL HIGH (ref 70–99)
Glucose-Capillary: 132 mg/dL — ABNORMAL HIGH (ref 70–99)
Glucose-Capillary: 151 mg/dL — ABNORMAL HIGH (ref 70–99)
Glucose-Capillary: 88 mg/dL (ref 70–99)
Glucose-Capillary: 150 mg/dL — ABNORMAL HIGH (ref 70–99)

## 2020-05-13 LAB — C-REACTIVE PROTEIN: CRP: 5.9 mg/dL — ABNORMAL HIGH (ref <1.0)

## 2020-05-13 NOTE — Progress Notes (Signed)
PROGRESS NOTE  Clayton [name]  DOB: Dec 25, 1955  PCP: Kristen Loader, FNP [id number]  DOA: 04/24/2020  LOS: 19 days   Chief Complaint  Patient presents with  . Code Sepsis  . Shortness of Breath   Brief narrative: Clayton [name] is a 64 y.o. male with PMH of asthma, OSA not on CPAP, type II DM, obesity, depression. Patient was brought to the ED on 9/20 with complaint of cough, shortness of breath and fever of 102-104 at home. Not vaccinated against Covid.  In the ED, he was noted to be severely hypoxic to 72% on room air and required up to 15 L of oxygen by nasal cannula. COVID-19 PCR positive. Chest x-ray showed bilateral multifocal pneumonia. He was also noted to have bilateral DVT.   He was admitted to hospital service for Covid pneumonia.   Critical care consultation was obtained as well.   His oxygen requirement continued to worsen.  He was treated in the ICU/SDU with heated high flow due to persistent and severe hypoxemia.   Subjective: Patient was seen and examined this morning. Patient feels better today. His oxygen requirement is less today at 20 L/min. Pain from oral ulcer improving as well. He was able to tolerate some scrambled eggs this morning.   Assessment/Plan: COVID pneumonia Acute respiratory failure with hypoxia  -Presented with high fever, shortness of breath -COVID test: PCR positive on admission -Chest imaging: Chest x-ray on admission showed multifocal pneumonia  -Treatment: Patient completed 5-day course of IV remdesivir as well as 14-day course of oral baricitinib.  He is currently on high-dose of IV Solu-Medrol at 60 mg 3 times daily. -Remains on heated high flow oxygen by nasal cannula at a low rate of 20 L/min today.  CRP trending up. -On IV Lasix since 10/7. -Chest x-ray repeated on 10/8 showed low volume film with persistent diffuse bilateral airspace disease..   -Encouraged incentive spirometry, prone position, out of bed and early  mobilization as much as possible -Continue airborne/contact isolation precautions for duration of 3 weeks from the day of diagnosis. -WBC and inflammatory markers trend as below.    Lab Results  Component Value Date   SARSCOV2NAA POSITIVE (A) 04/24/2020    Recent Labs  Lab 05/08/20 0345 05/08/20 0345 05/09/20 0315 05/10/20 0254 05/11/20 0237 05/11/20 1446 05/12/20 0300 05/13/20 0240  WBC 26.5*  --  32.8* 23.1* 24.2* 21.7*  --   --   CRP 0.8   < > 1.3* 4.1* 3.2*  --  6.4* 5.9*   < > = values in this interval not displayed.   The treatment plan and use of medications and known side effects were discussed with patient/family. Some of the medications used are based on case reports/anecdotal data.  All other medications being used in the management of COVID-19 based on limited study data.  Complete risks and long-term side effects are unknown, however in the best clinical judgment they seem to be of some benefit.  Patient wanted to proceed with treatment options provided.  Oral ulcers -Improving with Magic mouthwash and nystatin suspension.   -Starting to tolerate soft food.  Advance diet as tolerated.  OSA -Not on CPAP at home  Poorly controlled T2DM Hyperglycemia -HbA1c 8.4%.   -Blood sugar level still remains elevated due to IV steroids. -Continue Levemir at 22 units twice daily, continue Premeal NovoLog to 8 units 3 times daily. Continue linagliptin and sliding scale insulin with Accu-Cheks. Recent Labs  Lab 05/12/20 1133 05/12/20 1802 05/12/20 2046 05/13/20  3419 05/13/20 1157  GLUCAP 191* 155* 187* 132* 151*   Acute bilateral lower extremity DVTs: -Continue xarelto  Depression, anxiety:  -Continue home regimen. Continue prn ativan and morphine.  Chronic diastolic CHF Essential hypertension -Echocardiogram from 9/28 showed EF of 65 to 62% grade 1 diastolic dysfunction. -On IV Lasix 40 mg daily.  Continue losartan if able to take oral tablets. -HCTZ on  hold. -IV hydralazine as needed.  Mobility: Encourage ambulation once oxygen level improves Code Status:   Code Status: Full Code  Nutritional status: Body mass index is 32.59 kg/m. Nutrition Problem: Increased nutrient needs Etiology: acute illness, catabolic illness (IWLNL-89 infection) Signs/Symptoms: estimated needs Diet Order            Diet heart healthy/carb modified Room service appropriate? Yes; Fluid consistency: Thin  Diet effective now                 DVT prophylaxis: SCDs Start: 04/24/20 1628 Rivaroxaban (XARELTO) tablet 15 mg  rivaroxaban (XARELTO) tablet 20 mg   Antimicrobials:  None Fluid: None Consultants: critical care Family Communication:  Wife updated by nursing  Status is: Inpatient  Remains inpatient appropriate because continues to remain dependent on heated high flow oxygen by nasal cannula  Dispo: The patient is from: Home              Anticipated d/c is to: Home              Anticipated d/c date is: > 3 days              Patient currently is not medically stable to d/c.   Infusions:    Scheduled Meds: . (feeding supplement) PROSource Plus  30 mL Oral BID BM  . vitamin C  500 mg Oral Daily  . benzonatate  100 mg Oral TID  . Chlorhexidine Gluconate Cloth  6 each Topical Daily  . furosemide  40 mg Intravenous Daily  . guaiFENesin  600 mg Oral BID  . insulin aspart  0-20 Units Subcutaneous TID WC  . insulin aspart  0-5 Units Subcutaneous QHS  . insulin aspart  8 Units Subcutaneous TID WC  . insulin detemir  22 Units Subcutaneous BID  . Ipratropium-Albuterol  1 puff Inhalation QID  . linagliptin  5 mg Oral Daily  . losartan  50 mg Oral Daily  . magic mouthwash w/lidocaine  10 mL Oral QID  . mouth rinse  15 mL Mouth Rinse BID  . methylPREDNISolone (SOLU-MEDROL) injection  60 mg Intravenous Q8H  . montelukast  10 mg Oral QHS  . nystatin  5 mL Oral QID  . Rivaroxaban  15 mg Oral BID WC   Followed by  . [START ON 05/22/2020] rivaroxaban   20 mg Oral Q supper  . senna-docusate  2 tablet Oral BID  . sertraline  100 mg Oral Daily  . zinc sulfate  220 mg Oral Daily    Antimicrobials: Anti-infectives (From admission, onward)   Start     Dose/Rate Route Frequency Ordered Stop   04/25/20 1000  remdesivir 100 mg in sodium chloride 0.9 % 100 mL IVPB        100 mg 200 mL/hr over 30 Minutes Intravenous Daily 04/24/20 1547 04/28/20 0945   04/25/20 1000  remdesivir 100 mg in sodium chloride 0.9 % 100 mL IVPB  Status:  Discontinued       "Followed by" Linked Group Details   100 mg 200 mL/hr over 30 Minutes Intravenous Daily 04/24/20 1633 04/24/20  1635   04/24/20 1645  remdesivir 200 mg in sodium chloride 0.9% 250 mL IVPB  Status:  Discontinued       "Followed by" Linked Group Details   200 mg 580 mL/hr over 30 Minutes Intravenous Once 04/24/20 1633 04/24/20 1635   04/24/20 1630  remdesivir 200 mg in sodium chloride 0.9% 250 mL IVPB        200 mg 580 mL/hr over 30 Minutes Intravenous Once 04/24/20 1547 04/24/20 1802      PRN meds: acetaminophen, albuterol, hydrALAZINE, HYDROcodone-homatropine, LORazepam, magic mouthwash, methocarbamol, metoprolol tartrate, morphine injection, oxymetazoline, sodium chloride, traMADol   Objective: Vitals:   05/13/20 1100 05/13/20 1152  BP:    Pulse: (!) 102   Resp:    Temp:  98.4 F (36.9 C)  SpO2: (!) 87%     Intake/Output Summary (Last 24 hours) at 05/13/2020 1355 Last data filed at 05/13/2020 1300 Gross per 24 hour  Intake 480 ml  Output 1525 ml  Net -1045 ml   Filed Weights   05/10/20 0459 05/11/20 0456 05/12/20 0437  Weight: 100.1 kg 100.1 kg 100.1 kg   Weight change:  Body mass index is 32.59 kg/m.   Physical Exam: General exam: Mild to moderate respiratory distress Skin: No rashes, lesions or ulcers. HEENT: Atraumatic, normocephalic, supple neck, no obvious bleeding.  Oral ulcers crusting. Lungs: Fine Velcro-like crackles on both lungs CVS: Regular rate and rhythm with  no murmur GI/Abd soft, nontender, nondistended, bowel sound present CNS: Alert, awake, oriented x3 Psychiatry: Mood appropriate Extremities: No pedal edema, no calf tenderness  Data Review: I have personally reviewed the laboratory data and studies available.  Recent Labs  Lab 05/08/20 0345 05/09/20 0315 05/10/20 0254 05/11/20 0237 05/11/20 1446  WBC 26.5* 32.8* 23.1* 24.2* 21.7*  NEUTROABS  --  29.1* 21.8* 22.8* 19.9*  HGB 14.0 13.9 14.2 13.3 13.2  HCT 44.1 41.8 43.8 40.4 40.8  MCV 93.6 92.1 93.2 91.4 92.9  PLT 347 391 290 292 303   Recent Labs  Lab 05/10/20 0254 05/11/20 0237 05/11/20 1446 05/12/20 0255 05/13/20 0240  NA 130* 134* 133* 135 133*  K 3.9 3.5 3.7 4.5 3.9  CL 87* 91* 92* 89* 88*  CO2 32 33* 30 32 33*  GLUCOSE 214* 148* 161* 138* 132*  BUN 29* 27* 23 23 21   CREATININE 0.82 0.71 0.66 0.72 0.55*  CALCIUM 8.3* 8.3* 8.2* 8.5* 8.6*  MG  --   --   --  2.6*  --   PHOS  --   --   --  3.4  --     F/u labs ordered  Signed, Terrilee Croak, MD [facility] 05/13/2020

## 2020-05-13 NOTE — Progress Notes (Signed)
Physical Therapy Treatment Patient Details Name: Clayton [name] MRN: [id number] DOB: [birth year] Today's Date: 05/13/2020    History of Present Illness 64 yo male admitted with COVID, bil DVT. Hx of asthma, obesity, DM.    PT Comments    Pt requiring increased time with multiple rest breaks to recover 2* SOB but did progress to standing and taking several steps to sit up in chair.  RN present.   Follow Up Recommendations  Home health PT;Supervision/Assistance - 24 hour     Equipment Recommendations  None recommended by PT    Recommendations for Other Services       Precautions / Restrictions Precautions Precautions: Fall Precaution Comments: monitor O2, HHFNC 20L FIO2 50% Restrictions Weight Bearing Restrictions: No    Mobility  Bed Mobility Overal bed mobility: Needs Assistance Bed Mobility: Supine to Sit     Supine to sit: Min assist     General bed mobility comments: pt. reaching out for a hand to assist him to sit up  Transfers Overall transfer level: Needs assistance Equipment used: Rolling walker (2 wheeled) Transfers: Sit to/from [facility] Sit to Stand: Min assist;+2 physical assistance;+2 safety/equipment Stand pivot transfers: Min assist;+2 physical assistance;+2 safety/equipment       General transfer comment: Pt up to EOB sitting and allowed to recover breathing and then HHA of 2 to stand, step to chair and sit up in recliner  Ambulation/Gait Ambulation/Gait assistance: Min assist;+2 physical assistance;+2 safety/equipment Gait Distance (Feet): 2 Feet Assistive device: 2 person hand held assist Gait Pattern/deviations: Step-to pattern;Decreased step length - right;Decreased step length - left;Shuffle     General Gait Details: limited by anxiety and SOB   Stairs             Wheelchair Mobility    Modified Rankin (Stroke Patients Only)       Balance Overall balance assessment: Needs assistance Sitting-balance  support: No upper extremity supported;Feet supported Sitting balance-Leahy Scale: Good     Standing balance support: Bilateral upper extremity supported Standing balance-Leahy Scale: Poor                              Cognition Arousal/Alertness: Awake/alert Behavior During Therapy: Flat affect;Restless;Anxious Overall Cognitive Status: Within Functional Limits for tasks assessed                                        Exercises      General Comments        Pertinent Vitals/Pain Pain Assessment: No/denies pain    Home Living                      Prior Function            PT Goals (current goals can now be found in the care plan section) Acute Rehab PT Goals PT Goal Formulation: With patient Time For Goal Achievement: 05/23/20 Potential to Achieve Goals: Good Progress towards PT goals: Progressing toward goals    Frequency    Min 3X/week      PT Plan Current plan remains appropriate    Co-evaluation              AM-PAC PT "6 Clicks" Mobility   Outcome Measure  Help needed turning from your back to your side while in a flat bed without using bedrails?: A  Little Help needed moving from lying on your back to sitting on the side of a flat bed without using bedrails?: A Little Help needed moving to and from a bed to a chair (including a wheelchair)?: A Little Help needed standing up from a chair using your arms (e.g., wheelchair or bedside chair)?: A Little Help needed to walk in hospital room?: A Lot Help needed climbing 3-5 steps with a railing? : Total 6 Click Score: 15    End of Session Equipment Utilized During Treatment: Oxygen Activity Tolerance: Treatment limited secondary to medical complications (Comment) Patient left: in chair;with call bell/phone within reach;with chair alarm set Nurse Communication: Mobility status PT Visit Diagnosis: Difficulty in walking, not elsewhere classified (R26.2)     Time:  8182-9937 PT Time Calculation (min) (ACUTE ONLY): 34 min  Charges:  $Therapeutic Activity: 23-37 mins                     [place] Pager [phone] Office [phone]    Stepfon Rawles 05/13/2020, 2:03 PM

## 2020-05-14 DIAGNOSIS — R739 Hyperglycemia, unspecified: Secondary | ICD-10-CM | POA: Diagnosis not present

## 2020-05-14 DIAGNOSIS — J9601 Acute respiratory failure with hypoxia: Secondary | ICD-10-CM | POA: Diagnosis not present

## 2020-05-14 DIAGNOSIS — U071 COVID-19: Secondary | ICD-10-CM | POA: Diagnosis not present

## 2020-05-14 LAB — BASIC METABOLIC PANEL
Anion gap: 10 (ref 5–15)
BUN: 29 mg/dL — ABNORMAL HIGH (ref 8–23)
CO2: 34 mmol/L — ABNORMAL HIGH (ref 22–32)
Calcium: 8.4 mg/dL — ABNORMAL LOW (ref 8.9–10.3)
Chloride: 88 mmol/L — ABNORMAL LOW (ref 98–111)
Creatinine, Ser: 0.7 mg/dL (ref 0.61–1.24)
GFR, Estimated: >60 mL/min (ref >60)
Glucose, Bld: 166 mg/dL — ABNORMAL HIGH (ref 70–99)
Potassium: 3.5 mmol/L (ref 3.5–5.1)
Sodium: 132 mmol/L — ABNORMAL LOW (ref 135–145)

## 2020-05-14 LAB — GLUCOSE, CAPILLARY
Glucose-Capillary: 183 mg/dL — ABNORMAL HIGH (ref 70–99)
Glucose-Capillary: 140 mg/dL — ABNORMAL HIGH (ref 70–99)
Glucose-Capillary: 141 mg/dL — ABNORMAL HIGH (ref 70–99)

## 2020-05-14 LAB — C-REACTIVE PROTEIN: CRP: 3.5 mg/dL — ABNORMAL HIGH (ref <1.0)

## 2020-05-14 MED ORDER — TRAZODONE HCL 50 MG PO TABS
50.0000 mg | ORAL_TABLET | Freq: Once | ORAL | Status: AC
  Administered 2020-05-14: 50 mg via ORAL
  Filled 2020-05-14: qty 1

## 2020-05-14 MED ORDER — VALACYCLOVIR HCL 500 MG PO TABS
500.0000 mg | ORAL_TABLET | Freq: Every day | ORAL | Status: DC
  Filled 2020-05-14: qty 1

## 2020-05-14 MED ORDER — VALACYCLOVIR HCL 500 MG PO TABS
1000.0000 mg | ORAL_TABLET | Freq: Every day | ORAL | Status: DC
  Administered 2020-05-14 – 2020-05-28 (×15): 1000 mg via ORAL
  Filled 2020-05-14 (×15): qty 2

## 2020-05-14 NOTE — Progress Notes (Signed)
PROGRESS NOTE  [name]  DOB: 07-05-1956  PCP: Kristen Loader, FNP SAY:[id number]  DOA: 04/24/2020  LOS: 20 days   Chief Complaint  Patient presents with  . Code Sepsis  . Shortness of Breath   Brief narrative: Clayton [name] is a 64 y.o. male with PMH of asthma, OSA not on CPAP, type II DM, obesity, depression. Patient was brought to the ED on 9/20 with complaint of cough, shortness of breath and fever of 102-104 at home. Not vaccinated against Covid.  In the ED, he was noted to be severely hypoxic to 72% on room air and required up to 15 L of oxygen by nasal cannula. COVID-19 PCR positive. Chest x-ray showed bilateral multifocal pneumonia. He was also noted to have bilateral DVT.   He was admitted to hospital service for Covid pneumonia.   Critical care consultation was obtained as well.   His oxygen requirement continued to worsen.  He was treated in the ICU/SDU with heated high flow due to persistent and severe hypoxemia.   Subjective: Patient was seen and examined this morning. Sitting up in chair.  Feels better.  Oxygen requirement is improving to 15 L/min today. Oral said pain is improving as well.  Assessment/Plan: COVID pneumonia Acute respiratory failure with hypoxia  -Presented with high fever, shortness of breath -COVID test: PCR positive on admission -Chest imaging: Chest x-ray on admission showed multifocal pneumonia  -Treatment: Patient completed 5-day course of IV remdesivir as well as 14-day course of oral baricitinib.  He is currently on high-dose of IV Solu-Medrol at 60 mg 3 times daily. -Remains on heated high flow oxygen by nasal cannula but oxygen dependence is improving, on 15 L/min today.  CRP improving. -On IV Lasix since 10/7. -Chest x-ray repeated on 10/8 showed low volume film with persistent diffuse bilateral airspace disease..   -Encouraged incentive spirometry, prone position, out of bed and early mobilization as much as  possible -Continue airborne/contact isolation precautions for duration of 3 weeks from the day of diagnosis. -WBC and inflammatory markers trend as below.    Lab Results  Component Value Date   SARSCOV2NAA POSITIVE (A) 04/24/2020    Recent Labs  Lab 05/08/20 0345 05/08/20 0345 05/09/20 0315 05/09/20 0315 05/10/20 0254 05/11/20 0237 05/11/20 1446 05/12/20 0300 05/13/20 0240 05/14/20 0248  WBC 26.5*  --  32.8*  --  23.1* 24.2* 21.7*  --   --   --   CRP 0.8   < > 1.3*   < > 4.1* 3.2*  --  6.4* 5.9* 3.5*   < > = values in this interval not displayed.   The treatment plan and use of medications and known side effects were discussed with patient/family. Some of the medications used are based on case reports/anecdotal data.  All other medications being used in the management of COVID-19 based on limited study data.  Complete risks and long-term side effects are unknown, however in the best clinical judgment they seem to be of some benefit.  Patient wanted to proceed with treatment options provided.  Oral ulcers -Currently on Magic mouthwash and nystatin suspension.  I would also add valacyclovir 1 g daily for next few days empirically. -Continue liquid diet.  Can be advanced as tolerated. OSA -Not on CPAP at home  Poorly controlled T2DM Hyperglycemia -HbA1c 8.4%.   -Blood sugar level elevated because of IV steroids and is now stabilized on currently insulin regimen. -Continue Levemir at 22 units twice daily, continue Premeal NovoLog to 8  units 3 times daily. Continue linagliptin and sliding scale insulin with Accu-Cheks. Recent Labs  Lab 05/13/20 0753 05/13/20 1157 05/13/20 1629 05/13/20 2134 05/14/20 0747  GLUCAP 132* 151* 88 150* 183*   Acute bilateral lower extremity DVTs: -Continue xarelto  Depression, anxiety:  -Continue home regimen. Continue prn ativan and morphine.  Chronic diastolic CHF Essential hypertension -Echocardiogram from 9/28 showed EF of 65 to 76%  grade 1 diastolic dysfunction. -On IV Lasix 40 mg daily.  Continue losartan if able to take oral tablets. -HCTZ on hold. -IV hydralazine as needed.  Mobility: Encourage ambulation once oxygen level improves Code Status:   Code Status: Full Code  Nutritional status: Body mass index is 29.63 kg/m. Nutrition Problem: Increased nutrient needs Etiology: acute illness, catabolic illness ([id number] infection) Signs/Symptoms: estimated needs Diet Order            Diet heart healthy/carb modified Room service appropriate? Yes; Fluid consistency: Thin  Diet effective now                 DVT prophylaxis: SCDs Start: 04/24/20 1628 Rivaroxaban (XARELTO) tablet 15 mg  rivaroxaban (XARELTO) tablet 20 mg   Antimicrobials:  None Fluid: None Consultants: critical care Family Communication:  Wife updated by nursing  Status is: Inpatient  Remains inpatient appropriate because continues to remain dependent on heated high flow oxygen by nasal cannula  Dispo: The patient is from: Home              Anticipated d/c is to: Home              Anticipated d/c date is: > 3 days              Patient currently is not medically stable to d/c.   Infusions:    Scheduled Meds: . (feeding supplement) PROSource Plus  30 mL Oral BID BM  . vitamin C  500 mg Oral Daily  . benzonatate  100 mg Oral TID  . Chlorhexidine Gluconate Cloth  6 each Topical Daily  . furosemide  40 mg Intravenous Daily  . guaiFENesin  600 mg Oral BID  . insulin aspart  0-20 Units Subcutaneous TID WC  . insulin aspart  0-5 Units Subcutaneous QHS  . insulin aspart  8 Units Subcutaneous TID WC  . insulin detemir  22 Units Subcutaneous BID  . Ipratropium-Albuterol  1 puff Inhalation QID  . linagliptin  5 mg Oral Daily  . losartan  50 mg Oral Daily  . magic mouthwash w/lidocaine  10 mL Oral QID  . mouth rinse  15 mL Mouth Rinse BID  . methylPREDNISolone (SOLU-MEDROL) injection  60 mg Intravenous Q8H  . montelukast  10 mg Oral  QHS  . nystatin  5 mL Oral QID  . Rivaroxaban  15 mg Oral BID WC   Followed by  . [START ON 05/22/2020] rivaroxaban  20 mg Oral Q supper  . senna-docusate  2 tablet Oral BID  . sertraline  100 mg Oral Daily  . valACYclovir  1,000 mg Oral Daily  . zinc sulfate  220 mg Oral Daily    Antimicrobials: Anti-infectives (From admission, onward)   Start     Dose/Rate Route Frequency Ordered Stop   05/14/20 1130  valACYclovir (VALTREX) tablet 500 mg  Status:  Discontinued        500 mg Oral Daily 05/14/20 1038 05/14/20 1041   05/14/20 1130  valACYclovir (VALTREX) tablet 1,000 mg        1,000 mg Oral Daily  05/14/20 1041     04/25/20 1000  remdesivir 100 mg in sodium chloride 0.9 % 100 mL IVPB        100 mg 200 mL/hr over 30 Minutes Intravenous Daily 04/24/20 1547 04/28/20 0945   04/25/20 1000  remdesivir 100 mg in sodium chloride 0.9 % 100 mL IVPB  Status:  Discontinued       "Followed by" Linked Group Details   100 mg 200 mL/hr over 30 Minutes Intravenous Daily 04/24/20 1633 04/24/20 1635   04/24/20 1645  remdesivir 200 mg in sodium chloride 0.9% 250 mL IVPB  Status:  Discontinued       "Followed by" Linked Group Details   200 mg 580 mL/hr over 30 Minutes Intravenous Once 04/24/20 1633 04/24/20 1635   04/24/20 1630  remdesivir 200 mg in sodium chloride 0.9% 250 mL IVPB        200 mg 580 mL/hr over 30 Minutes Intravenous Once 04/24/20 1547 04/24/20 1802      PRN meds: acetaminophen, albuterol, hydrALAZINE, HYDROcodone-homatropine, LORazepam, magic mouthwash, methocarbamol, metoprolol tartrate, morphine injection, oxymetazoline, sodium chloride, traMADol   Objective: Vitals:   05/14/20 1400 05/14/20 1448  BP: 103/69   Pulse:  99  Resp:  (!) 24  Temp:    SpO2: 90% 93%    Intake/Output Summary (Last 24 hours) at 05/14/2020 1449 Last data filed at 05/14/2020 1200 Gross per 24 hour  Intake 660 ml  Output 900 ml  Net -240 ml   Filed Weights   05/11/20 0456 05/12/20 0437  05/14/20 0500  Weight: 100.1 kg 100.1 kg 91 kg   Weight change:  Body mass index is 29.63 kg/m.   Physical Exam: General exam: Mild to moderate respiratory distress Skin: No rashes, lesions or ulcers. HEENT: Atraumatic, normocephalic, supple neck, no obvious bleeding.  Oral ulcers crusting. Lungs: Fine bilateral crackles. CVS: Regular rate and rhythm with no murmur GI/Abd soft, nontender, nondistended, bowel sound present CNS: Alert, awake, oriented x3 Psychiatry: Mood appropriate Extremities: No pedal edema, no calf tenderness  Data Review: I have personally reviewed the laboratory data and studies available.  Recent Labs  Lab 05/08/20 0345 05/09/20 0315 05/10/20 0254 05/11/20 0237 05/11/20 1446  WBC 26.5* 32.8* 23.1* 24.2* 21.7*  NEUTROABS  --  29.1* 21.8* 22.8* 19.9*  HGB 14.0 13.9 14.2 13.3 13.2  HCT 44.1 41.8 43.8 40.4 40.8  MCV 93.6 92.1 93.2 91.4 92.9  PLT 347 391 290 292 303   Recent Labs  Lab 05/11/20 0237 05/11/20 1446 05/12/20 0255 05/13/20 0240 05/14/20 0248  NA 134* 133* 135 133* 132*  K 3.5 3.7 4.5 3.9 3.5  CL 91* 92* 89* 88* 88*  CO2 33* 30 32 33* 34*  GLUCOSE 148* 161* 138* 132* 166*  BUN 27* 23 23 21  29*  CREATININE 0.71 0.66 0.72 0.55* 0.70  CALCIUM 8.3* 8.2* 8.5* 8.6* 8.4*  MG  --   --  2.6*  --   --   PHOS  --   --  3.4  --   --     F/u labs ordered  Signed, Terrilee Croak, MD [facility] 05/14/2020

## 2020-05-15 DIAGNOSIS — U071 COVID-19: Secondary | ICD-10-CM | POA: Diagnosis not present

## 2020-05-15 DIAGNOSIS — J9601 Acute respiratory failure with hypoxia: Secondary | ICD-10-CM | POA: Diagnosis not present

## 2020-05-15 LAB — BASIC METABOLIC PANEL
Anion gap: 11 (ref 5–15)
BUN: 29 mg/dL — ABNORMAL HIGH (ref 8–23)
CO2: 34 mmol/L — ABNORMAL HIGH (ref 22–32)
Calcium: 8.4 mg/dL — ABNORMAL LOW (ref 8.9–10.3)
Chloride: 87 mmol/L — ABNORMAL LOW (ref 98–111)
Creatinine, Ser: 0.59 mg/dL — ABNORMAL LOW (ref 0.61–1.24)
GFR, Estimated: >60 mL/min (ref >60)
Glucose, Bld: 103 mg/dL — ABNORMAL HIGH (ref 70–99)
Potassium: 3.2 mmol/L — ABNORMAL LOW (ref 3.5–5.1)
Sodium: 132 mmol/L — ABNORMAL LOW (ref 135–145)

## 2020-05-15 LAB — GLUCOSE, CAPILLARY
Glucose-Capillary: 142 mg/dL — ABNORMAL HIGH (ref 70–99)
Glucose-Capillary: 101 mg/dL — ABNORMAL HIGH (ref 70–99)
Glucose-Capillary: 227 mg/dL — ABNORMAL HIGH (ref 70–99)
Glucose-Capillary: 127 mg/dL — ABNORMAL HIGH (ref 70–99)
Glucose-Capillary: 221 mg/dL — ABNORMAL HIGH (ref 70–99)
Glucose-Capillary: 239 mg/dL — ABNORMAL HIGH (ref 70–99)

## 2020-05-15 LAB — C-REACTIVE PROTEIN: CRP: 1.7 mg/dL — ABNORMAL HIGH (ref <1.0)

## 2020-05-15 MED ORDER — PANTOPRAZOLE SODIUM 40 MG PO TBEC
40.0000 mg | DELAYED_RELEASE_TABLET | Freq: Every day | ORAL | Status: DC
  Administered 2020-05-15 – 2020-05-30 (×16): 40 mg via ORAL
  Filled 2020-05-15 (×16): qty 1

## 2020-05-15 MED ORDER — POTASSIUM CHLORIDE 10 MEQ/100ML IV SOLN
10.0000 meq | INTRAVENOUS | Status: DC
  Filled 2020-05-15: qty 100

## 2020-05-15 MED ORDER — POTASSIUM CHLORIDE CRYS ER 20 MEQ PO TBCR
40.0000 meq | EXTENDED_RELEASE_TABLET | Freq: Once | ORAL | Status: AC
  Administered 2020-05-15: 40 meq via ORAL
  Filled 2020-05-15: qty 2

## 2020-05-15 NOTE — Progress Notes (Signed)
Occupational Therapy Progress Note  Upon arrival patient saturation in 90s on 15L HFNC and NRB. Trial mobility without NRB, patient desaturate to 70% with bed mobility and sitting EOB. Patient initiates returning to semi-supine, NRB donned and increased time for recovery back to 88-90%. With cues to initiate pt return to EOB and supervision for donning socks with increased time and recovery between donning. Patient min A x2 for sit to stand and cues to initiate taking steps to recliner chair. Patient demonstrates decreased activity tolerance and eccentric control into recliner with O2 sats 79-80%. With increased time and use of IS patient recover to low 90s and able to maintain without NRB in sitting. Changed D/C recommendations for CIR as patient would greatly benefit from continued therapy to maximize activity tolerance, global strengthening, in order to safely participate in daily routine.    05/15/20 1200  OT Visit Information  Last OT Received On 05/15/20  Assistance Needed +2  PT/OT/SLP Co-Evaluation/Treatment Yes  Reason for Co-Treatment For patient/therapist safety;To address functional/ADL transfers;Complexity of the patient's impairments (multi-system involvement)  OT goals addressed during session ADL's and self-care  History of Present Illness 64 yo male admitted with COVID, bil DVT. Hx of asthma, obesity, DM.  Precautions  Precautions Fall  Precaution Comments monitor O2, HFNC and NRB 15L  Pain Assessment  Pain Assessment Faces  Faces Pain Scale 0  Cognition  Arousal/Alertness Awake/alert  Behavior During Therapy Anxious  Overall Cognitive Status Within Functional Limits for tasks assessed  ADL  Overall ADL's  Needs assistance/impaired  Eating/Feeding Independent;Sitting  Eating/Feeding Details (indicate cue type and reason) drink from cup  Lower Body Dressing Supervision/safety;Sitting/lateral leans  Lower Body Dressing Details (indicate cue type and reason) witht increased  time patient able to don socks with figure 4 method, requiring rest break after each sock donned   Toilet Transfer Minimal assistance;+2 for physical assistance;+2 for safety/equipment;Cueing for safety;BSC;RW  Toilet Transfer Details (indicate cue type and reason) to recliner, min A x2 to power up to standing, cues to take few steps to recliner chair, patient with decreased eccentric control   Functional mobility during ADLs Minimal assistance;+2 for physical assistance;+2 for safety/equipment;Rolling walker;Cueing for safety  General ADL Comments patient with continued decreased activity tolerance, initiates laying himself back into bed after sitting up ~4 minutes with increased time for recovery and donning NRB while semi-supine. Utilized NRB for remainder of session as patient desaturates to 70% with only 15L HFNC   Bed Mobility  Overal bed mobility Needs Assistance  Bed Mobility Supine to Sit;Sit to Supine  Supine to sit Min guard  Sit to supine Supervision  General bed mobility comments increased time for supine to sit  Balance  Overall balance assessment Needs assistance  Sitting-balance support Feet supported  Sitting balance-Leahy Scale Fair  Standing balance support Bilateral upper extremity supported  Standing balance-Leahy Scale Poor  Restrictions  Weight Bearing Restrictions No  Transfers  Overall transfer level Needs assistance  Equipment used Rolling walker (2 wheeled)  Transfers Sit to/from Stand  Sit to Stand Min assist;+2 physical assistance;+2 safety/equipment  General transfer comment assist to power up to standing, cues to initiate steps to recliner, limited eccentric control into chair  General Comments  General comments (skin integrity, edema, etc.) patient saturating in high 90s on 15L HFNC and NRB therefore doffed NRB to assess mobility, patient desat to 70% with only HFNC requiring increased time for recovery and donning NRB. With chair transfer patient desaturate  to 79-80% and able to  recover to low 90s and maintain seated in chair without NRB  OT - End of Session  Equipment Utilized During Treatment Rolling walker;Oxygen  Activity Tolerance Patient limited by fatigue;Patient tolerated treatment well  Patient left in chair;with call bell/phone within reach;with chair alarm set  Nurse Communication Mobility status;Other (comment) (O2)  OT Assessment/Plan  OT Plan Discharge plan needs to be updated  OT Visit Diagnosis Other abnormalities of gait and mobility (R26.89);Muscle weakness (generalized) (M62.81)  OT Frequency (ACUTE ONLY) Min 2X/week  Recommendations for Other Services Rehab consult  Follow Up Recommendations CIR  OT Equipment Tub/shower seat  AM-PAC OT "6 Clicks" Daily Activity Outcome Measure (Version 2)  Help from another person eating meals? 4  Help from another person taking care of personal grooming? 3  Help from another person toileting, which includes using toliet, bedpan, or urinal? 2  Help from another person bathing (including washing, rinsing, drying)? 2  Help from another person to put on and taking off regular upper body clothing? 3  Help from another person to put on and taking off regular lower body clothing? 3  6 Click Score 17  OT Goal Progression  Progress towards OT goals Progressing toward goals  Acute Rehab OT Goals  Patient Stated Goal agreeable to get to chair  OT Goal Formulation With patient  Time For Goal Achievement 05/24/20  Potential to Achieve Goals Good  OT Time Calculation  OT Start Time (ACUTE ONLY) 0854  OT Stop Time (ACUTE ONLY) 3335  OT Time Calculation (min) 34 min  OT General Charges  $OT Visit 1 Visit  OT Treatments  $Self Care/Home Management  8-22 mins   Delbert Phenix OT OT pager: [phone]

## 2020-05-15 NOTE — Progress Notes (Signed)
Inpatient Rehab Admissions Coordinator Note:   Per therapy recommendations, pt was screened for CIR candidacy by Clemens Catholic, [place] CCC-SLP. At this time, Pt. Requires 15L O2 via HFNC. CIR cannot take Pt. Until he is tolerating on 6L on less. Pt. Does demonstrate functional decline, so I will follow to monitor progress and oxygen needs and place CIR order when Pt. Becomes appropriate.  Please contact me with questions.   Clemens Catholic, [place], [place] Admissions Coordinator  [phone] ([place]) [phone] (office)

## 2020-05-15 NOTE — Progress Notes (Signed)
Physical Therapy Treatment Patient Details Name: Clayton Clayton MRN: [id number] DOB: November 12, 1955 Today's Date: 05/15/2020    History of Present Illness 64 yo male admitted with COVID, bil DVT. Hx of asthma, obesity, DM.    PT Comments    The patient is more alert and responsive  And amenable to mobility. Patient currently 955 on 15 L HFNC and  NRB.Clayton Clayton Patient removed NRB with SPO2  Dropping to 85%. Replaced NRB for mobility . Patient requires assistance to power up from bed, Extra time to take small steps to recliner. SPO2 dropping into Low 80's. Gradual return to >88% on HFNC and NRB. Continue  Progressive mobility.   Follow Up Recommendations  CIR     Equipment Recommendations  None recommended by PT    Recommendations for Other Services       Precautions / Restrictions Precautions Precautions: Fall Precaution Comments: monitor O2, HFNC and NRB 15L Restrictions Weight Bearing Restrictions: No    Mobility  Bed Mobility Overal bed mobility: Needs Assistance Bed Mobility: Supine to Sit;Sit to Supine     Supine to sit: Min guard Sit to supine: Supervision   General bed mobility comments: increased time for supine to sit, after sitting up first time, patient returned to supine, placed NRB. Sat up again.  Transfers Overall transfer level: Needs assistance Equipment used: Rolling walker (2 wheeled) Transfers: Sit to/from Stand Sit to Stand: Min assist;+2 physical assistance;+2 safety/equipment         General transfer comment: assist to power up to standing, cues to initiate steps to recliner, limited eccentric control into chair  Ambulation/Gait                 Stairs             Wheelchair Mobility    Modified Rankin (Stroke Patients Only)       Balance Overall balance assessment: Needs assistance Sitting-balance support: Feet supported Sitting balance-Leahy Scale: Fair     Standing balance support: Bilateral upper extremity  supported Standing balance-Leahy Scale: Poor                              Cognition Arousal/Alertness: Awake/alert Behavior During Therapy: WFL for tasks assessed/performed Overall Cognitive Status: Within Functional Limits for tasks assessed                                 General Comments: less anxious, indicates that he wants to move      Exercises      General Comments General comments (skin integrity, edema, etc.): patient saturating in high 90s on 15L HFNC and NRB therefore doffed NRB to assess mobility, patient desat to 70% with only HFNC requiring increased time for recovery and donning NRB. With chair transfer patient desaturate to 79-80% and able to recover to low 90s and maintain seated in chair without NRB      Pertinent Vitals/Pain Pain Assessment: No/denies pain Faces Pain Scale: No hurt    Home Living                      Prior Function            PT Goals (current goals can now be found in the care plan section) Acute Rehab PT Goals Patient Stated Goal: agreeable to get to chair Progress towards PT goals: Progressing toward goals  Frequency    Min 3X/week      PT Plan Current plan remains appropriate    Co-evaluation PT/OT/SLP Co-Evaluation/Treatment: Yes Reason for Co-Treatment: Complexity of the patient's impairments (multi-system involvement);For patient/therapist safety PT goals addressed during session: Mobility/safety with mobility OT goals addressed during session: ADL's and self-care      AM-PAC PT "6 Clicks" Mobility   Outcome Measure  Help needed turning from your back to your side while in a flat bed without using bedrails?: A Little Help needed moving from lying on your back to sitting on the side of a flat bed without using bedrails?: A Little Help needed moving to and from a bed to a chair (including a wheelchair)?: A Lot Help needed standing up from a chair using your arms (e.g., wheelchair  or bedside chair)?: A Lot Help needed to walk in hospital room?: Total Help needed climbing 3-5 steps with a railing? : Total 6 Click Score: 12    End of Session Equipment Utilized During Treatment: Oxygen Activity Tolerance: Treatment limited secondary to medical complications (Comment) Patient left: in chair;with call bell/phone within reach;with chair alarm set Nurse Communication: Mobility status PT Visit Diagnosis: Difficulty in walking, not elsewhere classified (R26.2)     Time: 1093-2355 PT Time Calculation (min) (ACUTE ONLY): 34 min  Charges:  $Therapeutic Activity: 8-22 mins                     [place] Pager [phone] Office [phone]    Claretha Cooper 05/15/2020, 1:51 PM

## 2020-05-15 NOTE — TOC Progression Note (Signed)
Transition of Care [facility]) - Progression Note    Patient Details  Name: Clayton [name] MRN: [id number] Date of Birth: 1956/02/03  Transition of Care [facility]) CM/SW Contact  Leeroy Cha, RN Phone Number: 05/15/2020, 8:01 AM  Clinical Narrative:    COVID pneumonia Acute respiratory failure with hypoxia  -Presented with high fever, shortness of breath -COVID test: PCR positive on admission -Chest imaging: Chest x-ray on admission showed multifocal pneumonia  -Treatment: Patient completed 5-day course of IV remdesivir as well as 14-day course of oral baricitinib.  He is currently on high-dose of IV Solu-Medrol at 60 mg 3 times daily. -Remains on heated high flow oxygen by nasal cannula but oxygen dependence is improving, on 15 L/min today.  CRP improving. -On IV Lasix since 10/7. -Chest x-ray repeated on 10/8 showed low volume film with persistent diffuse bilateral airspace disease..   -Encouraged incentive spirometry, prone position, out of bed and early mobilization as much as possible -Continue airborne/contact isolation precautions for duration of 3 weeks from the day of diagnosis. -WBC and inflammatory markers trend as below.   iuv solu-medrol, hfnc at 12l/min, crp-1.7, bun -29 creat 0.59, Following for progression and toc needs may need hhc. Expected Discharge Plan: Home/Self Care Barriers to Discharge: Continued Medical Work up  Expected Discharge Plan and Services Expected Discharge Plan: Home/Self Care   Discharge Planning Services: CM Consult   Living arrangements for the past 2 months: Single Family Home                                       Social Determinants of Health (SDOH) Interventions    Readmission Risk Interventions No flowsheet data found.

## 2020-05-15 NOTE — Progress Notes (Signed)
PROGRESS NOTE  Clayton [name]  DOB: January 03, 1956  PCP: Kristen Loader, FNP AVW:[id number]  DOA: 04/24/2020  LOS: 21 days   Chief Complaint  Patient presents with  . Code Sepsis  . Shortness of Breath   Brief narrative: Clayton [name] is a 64 y.o. male with PMH of asthma, OSA not on CPAP, type II DM, obesity, depression. Patient was brought to the ED on 9/20 with complaint of cough, shortness of breath and fever of 102-104 at home. Not vaccinated against Covid.  In the ED, he was noted to be severely hypoxic to 72% on room air and required up to 15 L of oxygen by nasal cannula. COVID-19 PCR positive. Chest x-ray showed bilateral multifocal pneumonia. He was also noted to have bilateral DVT.   He was admitted to hospital service for Covid pneumonia.   Critical care consultation was obtained as well.   His oxygen requirement continued to worsen.  He was treated in the ICU/SDU with heated high flow due to persistent and severe hypoxemia.   Subjective: Patient was seen and examined this morning. Continues to remain on 15 L oxygen by nasal cannula.  Preparing to work with physical therapy this morning. Oral ulcer pain continues but he has been able to tolerate full liquid diet at least. On repeated questioning, he denies any pain once he swallows.   Assessment/Plan: COVID pneumonia Acute respiratory failure with hypoxia  -Presented with high fever, shortness of breath -COVID test: PCR positive on admission -Chest imaging: Chest x-ray on admission showed multifocal pneumonia  -Treatment: Patient completed 5-day course of IV remdesivir as well as 14-day course of oral baricitinib.  He is currently on high-dose of IV Solu-Medrol at 60 mg 3 times daily.  Continue the same. -Remains on heated high flow oxygen by nasal cannula but oxygen dependence is improving, on 15 L/min today.  CRP improving. -On IV Lasix since 10/7.  Continue the same.  BUN/creatinine ratio increasing but creatinine  remains stable. -Protonix while on heavy dose of steroids -Chest x-ray repeated on 10/8 showed low volume film with persistent diffuse bilateral airspace disease..   -Encouraged incentive spirometry, prone position, out of bed and early mobilization as much as possible -10/7, can come off airborne isolation since he spent 3 weeks since his first day of diagnosis. -WBC and inflammatory markers trend as below.    Lab Results  Component Value Date   SARSCOV2NAA POSITIVE (A) 04/24/2020    Recent Labs  Lab 05/09/20 0315 05/09/20 0315 05/10/20 0254 05/10/20 0254 05/11/20 0237 05/11/20 1446 05/12/20 0300 05/13/20 0240 05/14/20 0248 05/15/20 0659  WBC 32.8*  --  23.1*  --  24.2* 21.7*  --   --   --   --   CRP 1.3*   < > 4.1*   < > 3.2*  --  6.4* 5.9* 3.5* 1.7*   < > = values in this interval not displayed.   The treatment plan and use of medications and known side effects were discussed with patient/family. Some of the medications used are based on case reports/anecdotal data.  All other medications being used in the management of COVID-19 based on limited study data.  Complete risks and long-term side effects are unknown, however in the best clinical judgment they seem to be of some benefit.  Patient wanted to proceed with treatment options provided.  Oral ulcers -Continues to have pain but improving gradually on valacyclovir, Magic mouthwash and nystatin suspension.  Currently on liquid diet.  Advance as  tolerated.  OSA -Not on CPAP at home  Poorly controlled T2DM Hyperglycemia -HbA1c 8.4%.   -Blood sugar level elevated because of IV steroids and is now stabilized on current insulin regimen. -Continue Levemir at 22 units twice daily, continue Premeal NovoLog to 8 units 3 times daily. Continue linagliptin and sliding scale insulin with Accu-Cheks.  Once we are able to taper down steroids, will slow down on insulin doses as well. Recent Labs  Lab 05/13/20 2134 05/14/20 0747  05/14/20 1604 05/14/20 2150 05/15/20 0754  GLUCAP 150* 183* 140* 141* 101*   Acute bilateral lower extremity DVTs: -Continue xarelto  Depression, anxiety:  -Continue home regimen. Continue prn ativan and morphine.  Chronic diastolic CHF Essential hypertension -Echocardiogram from 9/28 showed EF of 65 to 66% grade 1 diastolic dysfunction. -On IV Lasix 40 mg daily.  Continue losartan if able to take oral tablets. -HCTZ on hold.  Blood pressure remained stable. -IV hydralazine as needed.  Hypokalemia -Potassium level low today at 3.2, likely because of IV Lasix.  He has hard time taking oral pills.  I ordered for IV potassium replacement. -Repeat labs tomorrow including magnesium and phosphorus. Recent Labs  Lab 05/11/20 1446 05/12/20 0255 05/13/20 0240 05/14/20 0248 05/15/20 0659  K 3.7 4.5 3.9 3.5 3.2*  MG  --  2.6*  --   --   --   PHOS  --  3.4  --   --   --    Mobility: Encourage ambulation once oxygen level improves Code Status:   Code Status: Full Code  Nutritional status: Body mass index is 29.63 kg/m. Nutrition Problem: Increased nutrient needs Etiology: acute illness, catabolic illness (ZLDJT-[id number] infection) Signs/Symptoms: estimated needs Diet Order            Diet heart healthy/carb modified Room service appropriate? Yes; Fluid consistency: Thin  Diet effective now                 DVT prophylaxis: SCDs Start: 04/24/20 1628 Rivaroxaban (XARELTO) tablet 15 mg  rivaroxaban (XARELTO) tablet 20 mg   Antimicrobials:  None Fluid: None Consultants: critical care Family Communication:  Wife updated by nursing  Status is: Inpatient  Remains inpatient appropriate because continues to remain dependent on heated high flow oxygen by nasal cannula  Dispo: The patient is from: Home              Anticipated d/c is to: Home              Anticipated d/c date is: > 3 days              Patient currently is not medically stable to d/c.   Infusions:     Scheduled Meds: . (feeding supplement) PROSource Plus  30 mL Oral BID BM  . vitamin C  500 mg Oral Daily  . benzonatate  100 mg Oral TID  . Chlorhexidine Gluconate Cloth  6 each Topical Daily  . furosemide  40 mg Intravenous Daily  . guaiFENesin  600 mg Oral BID  . insulin aspart  0-20 Units Subcutaneous TID WC  . insulin aspart  0-5 Units Subcutaneous QHS  . insulin aspart  8 Units Subcutaneous TID WC  . insulin detemir  22 Units Subcutaneous BID  . Ipratropium-Albuterol  1 puff Inhalation QID  . linagliptin  5 mg Oral Daily  . losartan  50 mg Oral Daily  . magic mouthwash w/lidocaine  10 mL Oral QID  . mouth rinse  15 mL Mouth Rinse BID  .  methylPREDNISolone (SOLU-MEDROL) injection  60 mg Intravenous Q8H  . montelukast  10 mg Oral QHS  . nystatin  5 mL Oral QID  . Rivaroxaban  15 mg Oral BID WC   Followed by  . [START ON 05/22/2020] rivaroxaban  20 mg Oral Q supper  . senna-docusate  2 tablet Oral BID  . sertraline  100 mg Oral Daily  . valACYclovir  1,000 mg Oral Daily  . zinc sulfate  220 mg Oral Daily    Antimicrobials: Anti-infectives (From admission, onward)   Start     Dose/Rate Route Frequency Ordered Stop   05/14/20 1130  valACYclovir (VALTREX) tablet 500 mg  Status:  Discontinued        500 mg Oral Daily 05/14/20 1038 05/14/20 1041   05/14/20 1130  valACYclovir (VALTREX) tablet 1,000 mg        1,000 mg Oral Daily 05/14/20 1041     04/25/20 1000  remdesivir 100 mg in sodium chloride 0.9 % 100 mL IVPB        100 mg 200 mL/hr over 30 Minutes Intravenous Daily 04/24/20 1547 04/28/20 0945   04/25/20 1000  remdesivir 100 mg in sodium chloride 0.9 % 100 mL IVPB  Status:  Discontinued       "Followed by" Linked Group Details   100 mg 200 mL/hr over 30 Minutes Intravenous Daily 04/24/20 1633 04/24/20 1635   04/24/20 1645  remdesivir 200 mg in sodium chloride 0.9% 250 mL IVPB  Status:  Discontinued       "Followed by" Linked Group Details   200 mg 580 mL/hr over 30  Minutes Intravenous Once 04/24/20 1633 04/24/20 1635   04/24/20 1630  remdesivir 200 mg in sodium chloride 0.9% 250 mL IVPB        200 mg 580 mL/hr over 30 Minutes Intravenous Once 04/24/20 1547 04/24/20 1802      PRN meds: acetaminophen, albuterol, hydrALAZINE, HYDROcodone-homatropine, LORazepam, magic mouthwash, methocarbamol, metoprolol tartrate, morphine injection, oxymetazoline, sodium chloride, traMADol   Objective: Vitals:   05/15/20 0700 05/15/20 0800  BP:  (!) 122/93  Pulse: 66 89  Resp: 15 (!) 21  Temp:  97.6 F (36.4 C)  SpO2: 100% 97%    Intake/Output Summary (Last 24 hours) at 05/15/2020 0926 Last data filed at 05/15/2020 0000 Gross per 24 hour  Intake 600 ml  Output 875 ml  Net -275 ml   Filed Weights   05/12/20 0437 05/14/20 0500 05/15/20 0500  Weight: 100.1 kg 91 kg 91 kg   Weight change: 0 kg Body mass index is 29.63 kg/m.   Physical Exam: General exam: Remains at rest on high flow oxygen by nasal cannula. Skin: No rashes, lesions or ulcers. HEENT: Atraumatic, normocephalic, supple neck, no obvious bleeding.  Oral ulcers crusted. Lungs: Continues to have bilateral scattered fine crackles. CVS: Regular rate and rhythm with no murmur GI/Abd soft, nontender, nondistended, bowel sound present CNS: Alert, awake, oriented x3 Psychiatry: Mood appropriate Extremities: No pedal edema, no calf tenderness  Data Review: I have personally reviewed the laboratory data and studies available.  Recent Labs  Lab 05/09/20 0315 05/10/20 0254 05/11/20 0237 05/11/20 1446  WBC 32.8* 23.1* 24.2* 21.7*  NEUTROABS 29.1* 21.8* 22.8* 19.9*  HGB 13.9 14.2 13.3 13.2  HCT 41.8 43.8 40.4 40.8  MCV 92.1 93.2 91.4 92.9  PLT 391 290 292 303   Recent Labs  Lab 05/11/20 1446 05/12/20 0255 05/13/20 0240 05/14/20 0248 05/15/20 0659  NA 133* 135 133* 132* 132*  K 3.7 4.5 3.9 3.5 3.2*  CL 92* 89* 88* 88* 87*  CO2 30 32 33* 34* 34*  GLUCOSE 161* 138* 132* 166* 103*    BUN 23 23 21  29* 29*  CREATININE 0.66 0.72 0.55* 0.70 0.59*  CALCIUM 8.2* 8.5* 8.6* 8.4* 8.4*  MG  --  2.6*  --   --   --   PHOS  --  3.4  --   --   --     F/u labs ordered  Signed, Terrilee Croak, MD [facility] 05/15/2020

## 2020-05-16 DIAGNOSIS — J9601 Acute respiratory failure with hypoxia: Secondary | ICD-10-CM | POA: Diagnosis not present

## 2020-05-16 DIAGNOSIS — U071 COVID-19: Secondary | ICD-10-CM | POA: Diagnosis not present

## 2020-05-16 LAB — BLOOD GAS, ARTERIAL
Acid-Base Excess: 9.8 mmol/L — ABNORMAL HIGH (ref 0.0–2.0)
Bicarbonate: 35.1 mmol/L — ABNORMAL HIGH (ref 20.0–28.0)
O2 Saturation: 86.6 %
Patient temperature: 98.1
pCO2 arterial: 50.5 mmHg — ABNORMAL HIGH (ref 32.0–48.0)
pH, Arterial: 7.455 — ABNORMAL HIGH (ref 7.350–7.450)
pO2, Arterial: 52.4 mmHg — ABNORMAL LOW (ref 83.0–108.0)

## 2020-05-16 LAB — PHOSPHORUS: Phosphorus: 3.1 mg/dL (ref 2.5–4.6)

## 2020-05-16 LAB — MAGNESIUM: Magnesium: 2.3 mg/dL (ref 1.7–2.4)

## 2020-05-16 LAB — GLUCOSE, CAPILLARY
Glucose-Capillary: 160 mg/dL — ABNORMAL HIGH (ref 70–99)
Glucose-Capillary: 256 mg/dL — ABNORMAL HIGH (ref 70–99)
Glucose-Capillary: 82 mg/dL (ref 70–99)
Glucose-Capillary: 147 mg/dL — ABNORMAL HIGH (ref 70–99)

## 2020-05-16 LAB — C-REACTIVE PROTEIN: CRP: 1.1 mg/dL — ABNORMAL HIGH (ref <1.0)

## 2020-05-16 LAB — BASIC METABOLIC PANEL
Anion gap: 13 (ref 5–15)
BUN: 34 mg/dL — ABNORMAL HIGH (ref 8–23)
CO2: 32 mmol/L (ref 22–32)
Calcium: 8.3 mg/dL — ABNORMAL LOW (ref 8.9–10.3)
Chloride: 85 mmol/L — ABNORMAL LOW (ref 98–111)
Creatinine, Ser: 0.82 mg/dL (ref 0.61–1.24)
GFR, Estimated: >60 mL/min (ref >60)
Glucose, Bld: 235 mg/dL — ABNORMAL HIGH (ref 70–99)
Potassium: 3.5 mmol/L (ref 3.5–5.1)
Sodium: 130 mmol/L — ABNORMAL LOW (ref 135–145)

## 2020-05-16 MED ORDER — METHYLPREDNISOLONE SODIUM SUCC 40 MG IJ SOLR
40.0000 mg | Freq: Two times a day (BID) | INTRAMUSCULAR | Status: DC
  Administered 2020-05-16 – 2020-05-24 (×16): 40 mg via INTRAVENOUS
  Filled 2020-05-16 (×17): qty 1

## 2020-05-16 MED ORDER — INSULIN DETEMIR 100 UNIT/ML ~~LOC~~ SOLN
18.0000 [IU] | Freq: Two times a day (BID) | SUBCUTANEOUS | Status: DC
  Administered 2020-05-16 – 2020-05-26 (×20): 18 [IU] via SUBCUTANEOUS
  Filled 2020-05-16 (×20): qty 0.18

## 2020-05-16 MED ORDER — INSULIN ASPART 100 UNIT/ML ~~LOC~~ SOLN
5.0000 [IU] | Freq: Three times a day (TID) | SUBCUTANEOUS | Status: DC
  Administered 2020-05-16 – 2020-05-24 (×20): 5 [IU] via SUBCUTANEOUS

## 2020-05-16 MED ORDER — QUETIAPINE FUMARATE 25 MG PO TABS
25.0000 mg | ORAL_TABLET | Freq: Every day | ORAL | Status: DC
  Administered 2020-05-16 – 2020-05-28 (×13): 25 mg via ORAL
  Filled 2020-05-16 (×13): qty 1

## 2020-05-16 NOTE — Progress Notes (Signed)
PROGRESS NOTE  [name]  DOB: [birth year]  PCP: Kristen Loader, FNP [id number]  DOA: 04/24/2020  LOS: 22 days   Chief Complaint  Patient presents with  . Code Sepsis  . Shortness of Breath   Brief narrative: Clayton [name] is a 64 y.o. male with PMH of asthma, OSA not on CPAP, type II DM, obesity, depression. Patient was brought to the ED on 9/20 with complaint of cough, shortness of breath and fever of 102-104 at home. Not vaccinated against Covid.  In the ED, he was noted to be severely hypoxic to 72% on room air and required up to 15 L of oxygen by nasal cannula. COVID-19 PCR positive. Chest x-ray showed bilateral multifocal pneumonia. He was also noted to have bilateral DVT.   He was admitted to hospitalist service for Covid pneumonia.   During this hospitalization, his oxygen requirement was as high as 50 L/min.  With aggressive treatment and continuation of high-dose steroids, oxygen requirement is slowly improving, currently on 10 L/min at rest.  He gets winded easily on minimal ambulation.  Subjective: Patient was seen and examined this morning. Tachypneic on 10 L/min maintain saturation between 85 to 90%. For last 2 to 3 days, patient is also having episodes of disorientation, it was more pronounced last night. At the time of my evaluation this morning, patient is back to his normal mental status, oriented x3. Oral ulcers slowly healing.  Continues to have pain on chewing, not on swallowing.  Assessment/Plan: COVID pneumonia Acute respiratory failure with hypoxia  -Presented with high fever, shortness of breath -COVID test: PCR positive on admission -Chest imaging: Chest x-ray on admission showed multifocal pneumonia  -Treatment: Patient completed 5-day course of IV remdesivir as well as 14-day course of oral baricitinib.  He is currently on high-dose of IV Solu-Medrol at 60 mg 3 times daily.   -Because of possibility of steroid-induced psychosis, I would  cut back on Solu-Medrol to 40 mg twice daily IV.  -Currently requiring 10 L/min of high flow oxygen this morning at rest.  Encourage ambulation daily. -On IV Lasix since 10/7.  BUN/creatinine ratio improving.  Patient seems to be adequately diuresed.  Hold IV Lasix today.   -Continue Protonix while on heavy dose of steroids. -Repeat chest x-ray tomorrow.  -Encouraged incentive spirometry, prone position, out of bed and early mobilization as much as possible -Off airborne isolation since 10/11 -WBC and inflammatory markers trend as below.    Lab Results  Component Value Date   SARSCOV2NAA POSITIVE (A) 04/24/2020    Recent Labs  Lab 05/10/20 0254 05/10/20 0254 05/11/20 0237 05/11/20 0237 05/11/20 1446 05/12/20 0300 05/13/20 0240 05/14/20 0248 05/15/20 0659 05/16/20 0247  WBC 23.1*  --  24.2*  --  21.7*  --   --   --   --   --   CRP 4.1*   < > 3.2*   < >  --  6.4* 5.9* 3.5* 1.7* 1.1*   < > = values in this interval not displayed.   The treatment plan and use of medications and known side effects were discussed with patient/family. Some of the medications used are based on case reports/anecdotal data.  All other medications being used in the management of COVID-19 based on limited study data.  Complete risks and long-term side effects are unknown, however in the best clinical judgment they seem to be of some benefit.  Patient wanted to proceed with treatment options provided.  Acute delirium Acute metabolic encephalopathy -  For last 2 to 3 days, patient is also having episodes of disorientation, it was more pronounced last night. At the time of my evaluation this morning, patient is back to his normal mental status, oriented x3. -Delirium likely because of high-dose of steroids as well as prolonged hospitalization.  Solu-Medrol dose have been reduced.  -Start on 25 mg of Seroquel nightly.  -Continue to monitor mental status.  Oral ulcers -Most likely aphthous/stress ulcers related  to acute illness.  Compromised appetite because of pain with any food.  Has been on liquid diet mostly.  Denies any pain on swallowing.   -Since his symptoms did not improve adequately with Magic mouthwash, I empirically started him on nystatin suspension as well as valacyclovir oral.  Ulcer and pain gradually improving now.  Able to tolerate soft diet. Advance diet as tolerated.  OSA -Has a diagnosis of sleep apnea but was not using CPAP at home.  Poorly controlled T2DM Hyperglycemia -HbA1c 8.4%.   -Insulin dose being adjusted with Accu-Cheks.  With reduction in steroid dose, insulin dose needs to be adjusted as well.   -Reduce Levemir from 22 units BID to 18 units BID. Reduce premeal NovoLog from 8 units to 5 units 3 times daily. Continue linagliptin and sliding scale insulin with Accu-Cheks.   Recent Labs  Lab 05/15/20 1205 05/15/20 1606 05/15/20 1944 05/15/20 2141 05/16/20 0835  GLUCAP 227* 127* 221* 239* 160*   Acute bilateral lower extremity DVTs: -Diagnosed on this admission.  Continue xarelto.  Depression, anxiety:  -Continue home regimen. Continue prn ativan and morphine.  Chronic diastolic CHF Essential hypertension -Currently on losartan and IV Lasix.  IV Lasix stopped today. -Continue losartan.  May need to resume HCTZ in next few days. -Continue to monitor blood pressure. -IV hydralazine as needed. -Echocardiogram from 9/28 showed EF of 65 to 98% grade 1 diastolic dysfunction.  Hypokalemia -Electrolytes being monitored while on IV Lasix.  Replaced accordingly.  Levels normal this morning.  -Continue to monitor. Recent Labs  Lab 05/12/20 0255 05/13/20 0240 05/14/20 0248 05/15/20 0659 05/16/20 0247  K 4.5 3.9 3.5 3.2* 3.5  MG 2.6*  --   --   --  2.3  PHOS 3.4  --   --   --  3.1   Mobility: Encourage ambulation  Code Status:   Code Status: Full Code  Nutritional status: Body mass index is 29.92 kg/m. Nutrition Problem: Increased nutrient  needs Etiology: acute illness, catabolic illness (XQJJH-41 infection) Signs/Symptoms: estimated needs Diet Order            Diet heart healthy/carb modified Room service appropriate? Yes; Fluid consistency: Thin  Diet effective now                 DVT prophylaxis:  Rivaroxaban (XARELTO) tablet 15 mg  rivaroxaban (XARELTO) tablet 20 mg   Antimicrobials:  None Fluid: None Consultants: critical care Family Communication:  Wife updated by nursing.  Status is: Inpatient  Remains inpatient appropriate because continues to remain dependent on heated high flow oxygen by nasal cannula  Dispo: The patient is from: Home              Anticipated d/c is to: Home              Anticipated d/c date is: > 3 days              Patient currently is not medically stable to d/c.   Infusions:    Scheduled Meds: . (feeding supplement)  PROSource Plus  30 mL Oral BID BM  . vitamin C  500 mg Oral Daily  . benzonatate  100 mg Oral TID  . Chlorhexidine Gluconate Cloth  6 each Topical Daily  . furosemide  40 mg Intravenous Daily  . guaiFENesin  600 mg Oral BID  . insulin aspart  0-20 Units Subcutaneous TID WC  . insulin aspart  0-5 Units Subcutaneous QHS  . insulin aspart  5 Units Subcutaneous TID WC  . insulin detemir  18 Units Subcutaneous BID  . Ipratropium-Albuterol  1 puff Inhalation QID  . linagliptin  5 mg Oral Daily  . losartan  50 mg Oral Daily  . magic mouthwash w/lidocaine  10 mL Oral QID  . mouth rinse  15 mL Mouth Rinse BID  . methylPREDNISolone (SOLU-MEDROL) injection  40 mg Intravenous Q12H  . montelukast  10 mg Oral QHS  . nystatin  5 mL Oral QID  . pantoprazole  40 mg Oral Daily  . QUEtiapine  25 mg Oral QHS  . Rivaroxaban  15 mg Oral BID WC   Followed by  . [START ON 05/22/2020] rivaroxaban  20 mg Oral Q supper  . senna-docusate  2 tablet Oral BID  . sertraline  100 mg Oral Daily  . valACYclovir  1,000 mg Oral Daily  . zinc sulfate  220 mg Oral Daily     Antimicrobials: Anti-infectives (From admission, onward)   Start     Dose/Rate Route Frequency Ordered Stop   05/14/20 1130  valACYclovir (VALTREX) tablet 500 mg  Status:  Discontinued        500 mg Oral Daily 05/14/20 1038 05/14/20 1041   05/14/20 1130  valACYclovir (VALTREX) tablet 1,000 mg        1,000 mg Oral Daily 05/14/20 1041     04/25/20 1000  remdesivir 100 mg in sodium chloride 0.9 % 100 mL IVPB        100 mg 200 mL/hr over 30 Minutes Intravenous Daily 04/24/20 1547 04/28/20 0945   04/25/20 1000  remdesivir 100 mg in sodium chloride 0.9 % 100 mL IVPB  Status:  Discontinued       "Followed by" Linked Group Details   100 mg 200 mL/hr over 30 Minutes Intravenous Daily 04/24/20 1633 04/24/20 1635   04/24/20 1645  remdesivir 200 mg in sodium chloride 0.9% 250 mL IVPB  Status:  Discontinued       "Followed by" Linked Group Details   200 mg 580 mL/hr over 30 Minutes Intravenous Once 04/24/20 1633 04/24/20 1635   04/24/20 1630  remdesivir 200 mg in sodium chloride 0.9% 250 mL IVPB        200 mg 580 mL/hr over 30 Minutes Intravenous Once 04/24/20 1547 04/24/20 1802      PRN meds: acetaminophen, albuterol, hydrALAZINE, HYDROcodone-homatropine, LORazepam, magic mouthwash, methocarbamol, metoprolol tartrate, morphine injection, oxymetazoline, sodium chloride, traMADol   Objective: Vitals:   05/16/20 0826 05/16/20 0900  BP:    Pulse:  86  Resp:  13  Temp:    SpO2: 100% 93%    Intake/Output Summary (Last 24 hours) at 05/16/2020 1050 Last data filed at 05/16/2020 0900 Gross per 24 hour  Intake 240 ml  Output 1650 ml  Net -1410 ml   Filed Weights   05/14/20 0500 05/15/20 0500 05/16/20 0500  Weight: 91 kg 91 kg 91.9 kg   Weight change: 0.9 kg Body mass index is 29.92 kg/m.   Physical Exam: General exam: Remains at rest on high flow  oxygen by nasal cannula.  Tachypneic, no other physical distress Skin: No rashes, lesions or ulcers. HEENT: Atraumatic,  normocephalic, supple neck, no obvious bleeding.  Oral ulcers crusted. Lungs: Clear to auscultation bilaterally CVS: Regular rate and rhythm with no murmur GI/Abd soft, nontender, nondistended, bowel sound present CNS: Alert, awake, oriented x3 Psychiatry: Mood appropriate Extremities: No pedal edema, no calf tenderness  Data Review: I have personally reviewed the laboratory data and studies available.  Recent Labs  Lab 05/10/20 0254 05/11/20 0237 05/11/20 1446  WBC 23.1* 24.2* 21.7*  NEUTROABS 21.8* 22.8* 19.9*  HGB 14.2 13.3 13.2  HCT 43.8 40.4 40.8  MCV 93.2 91.4 92.9  PLT 290 292 303   Recent Labs  Lab 05/12/20 0255 05/13/20 0240 05/14/20 0248 05/15/20 0659 05/16/20 0247  NA 135 133* 132* 132* 130*  K 4.5 3.9 3.5 3.2* 3.5  CL 89* 88* 88* 87* 85*  CO2 32 33* 34* 34* 32  GLUCOSE 138* 132* 166* 103* 235*  BUN 23 21 29* 29* 34*  CREATININE 0.72 0.55* 0.70 0.59* 0.82  CALCIUM 8.5* 8.6* 8.4* 8.4* 8.3*  MG 2.6*  --   --   --  2.3  PHOS 3.4  --   --   --  3.1    F/u labs ordered  Signed, Terrilee Croak, MD [facility] 05/16/2020

## 2020-05-16 NOTE — Progress Notes (Signed)
Called patient's wife and provided an update.  Wife asked if her husband got out of bed and walked today.  I told her he did.  She was glad to hear that and hopes he will work with PT on a daily basis.

## 2020-05-16 NOTE — Progress Notes (Signed)
Occupational Therapy Treatment Patient Details Name: Clayton [name] MRN: [id number] DOB: 12-06-1955 Today's Date: 05/16/2020    History of present illness 64 yo male admitted with COVID, bil DVT. Hx of asthma, obesity, DM.   OT comments  Patient/RN agreeable to trying and ambulate into bathroom this session. Patient initially on 10L HFNC and 15L NRB at 100%. With NRB doffed in chair patient maintaining in 90s with mod cues to slow respirations as patient becomes anxious with mobility. Patient mod A x1 to stand from recliner and ambulate into bathroom using rolling walker. Patient with near scissoring LEs requiring mod A for safety with ambulation and transfer onto toilet. Once seated patient desaturating significantly on 10L, increased to 15L however still desat to 40s therefore increased to 25L and RN provide additional tank to hook up NRB. Patient does recover quickly to mid 80s, requires mod A x2 to stand and total A for perianal hygiene after bowel movement. Patient mod A x2 to take few steps from toilet to recliner, able to wean back to 10L and 15 NRB maintaining in high 80s and RN present.    Follow Up Recommendations  CIR    Equipment Recommendations  Tub/shower seat       Precautions / Restrictions Precautions Precautions: Fall Precaution Comments: monitor O2, HFNC and NRB 15L       Mobility Bed Mobility               General bed mobility comments: OOB in recliner  Transfers Overall transfer level: Needs assistance Equipment used: Rolling walker (2 wheeled) Transfers: Sit to/from Stand Sit to Stand: Mod assist;+2 physical assistance;+2 safety/equipment         General transfer comment: mod A x1 to intially power up to standing from recliner and ambulate to bathroom with near scissoring of LEs. Patient mod A x2 to power up from toilet and take few steps to recliner     Balance Overall balance assessment: Needs assistance Sitting-balance support: Feet  supported Sitting balance-Leahy Scale: Fair     Standing balance support: Bilateral upper extremity supported;During functional activity Standing balance-Leahy Scale: Poor Standing balance comment: reliant on walker and external support                           ADL either performed or assessed with clinical judgement   ADL Overall ADL's : Needs assistance/impaired                         Toilet Transfer: Moderate assistance;+2 for physical assistance;+2 for safety/equipment;Ambulation;Regular Toilet;Grab bars;RW Armed forces technical officer Details (indicate cue type and reason): mod A x1 to toilet, mod A x2 to power up to standing and take few steps to recliner  Toileting- Clothing Manipulation and Hygiene: Total assistance;Sit to/from stand Toileting - Clothing Manipulation Details (indicate cue type and reason): after bowel movement, requires assist to maintain standing upright, decreased activity tolerance and balance     Functional mobility during ADLs: Moderate assistance;+2 for physical assistance;+2 for safety/equipment;Rolling walker General ADL Comments: trialed ambulation into bathroom this session on 10L HFNC, patient desats significantly first increased to 15L however continue to desat to 40s therefore increased to 25L and RN present with 2nd O2 tank for NRB mask. Patient does recover quickly to 80s however very fatigued after bowel movement therefore recliner chair brought to bathroom and patient mod A x2 to take few steps to recliner. Patient put on 10L HFNC and  15L NRB once back in recliner and able to maintain high 80s/RN present               Cognition Arousal/Alertness: Awake/alert Behavior During Therapy: Anxious Overall Cognitive Status: Within Functional Limits for tasks assessed                                                General Comments please see ADL section    Pertinent Vitals/ Pain       Pain Assessment: Faces Faces  Pain Scale: No hurt         Frequency  Min 2X/week        Progress Toward Goals  OT Goals(current goals can now be found in the care plan section)  Progress towards OT goals: Progressing toward goals  Acute Rehab OT Goals Patient Stated Goal: "can I sit on the toilet" OT Goal Formulation: With patient Time For Goal Achievement: 05/24/20 Potential to Achieve Goals: Good  Plan Discharge plan remains appropriate       AM-PAC OT "6 Clicks" Daily Activity     Outcome Measure   Help from another person eating meals?: None Help from another person taking care of personal grooming?: A Little Help from another person toileting, which includes using toliet, bedpan, or urinal?: A Lot Help from another person bathing (including washing, rinsing, drying)?: A Lot Help from another person to put on and taking off regular upper body clothing?: A Little Help from another person to put on and taking off regular lower body clothing?: A Lot 6 Click Score: 16    End of Session Equipment Utilized During Treatment: Rolling walker;Oxygen  OT Visit Diagnosis: Other abnormalities of gait and mobility (R26.89);Muscle weakness (generalized) (M62.81)   Activity Tolerance Patient limited by fatigue   Patient Left in chair;with call bell/phone within reach;with chair alarm set;with nursing/sitter in room   Nurse Communication Other (comment) (RN present for session)        Time: 8592-9244 OT Time Calculation (min): 25 min  Charges: OT General Charges $OT Visit: 1 Visit OT Treatments $Self Care/Home Management : 23-37 mins  Delbert Phenix OT OT pager: [phone]   Rosemary Holms 05/16/2020, 2:43 PM

## 2020-05-17 ENCOUNTER — Inpatient Hospital Stay (HOSPITAL_COMMUNITY): Payer: BC Managed Care – PPO

## 2020-05-17 DIAGNOSIS — U071 COVID-19: Secondary | ICD-10-CM | POA: Diagnosis not present

## 2020-05-17 DIAGNOSIS — J189 Pneumonia, unspecified organism: Secondary | ICD-10-CM | POA: Diagnosis not present

## 2020-05-17 DIAGNOSIS — J9601 Acute respiratory failure with hypoxia: Secondary | ICD-10-CM | POA: Diagnosis not present

## 2020-05-17 DIAGNOSIS — E1165 Type 2 diabetes mellitus with hyperglycemia: Secondary | ICD-10-CM | POA: Diagnosis not present

## 2020-05-17 LAB — GLUCOSE, CAPILLARY
Glucose-Capillary: 102 mg/dL — ABNORMAL HIGH (ref 70–99)
Glucose-Capillary: 91 mg/dL (ref 70–99)
Glucose-Capillary: 150 mg/dL — ABNORMAL HIGH (ref 70–99)
Glucose-Capillary: 209 mg/dL — ABNORMAL HIGH (ref 70–99)

## 2020-05-17 LAB — C-REACTIVE PROTEIN: CRP: 0.8 mg/dL (ref <1.0)

## 2020-05-17 LAB — BASIC METABOLIC PANEL
Anion gap: 12 (ref 5–15)
BUN: 33 mg/dL — ABNORMAL HIGH (ref 8–23)
CO2: 32 mmol/L (ref 22–32)
Calcium: 8.4 mg/dL — ABNORMAL LOW (ref 8.9–10.3)
Chloride: 87 mmol/L — ABNORMAL LOW (ref 98–111)
Creatinine, Ser: 0.79 mg/dL (ref 0.61–1.24)
GFR, Estimated: >60 mL/min (ref >60)
Glucose, Bld: 129 mg/dL — ABNORMAL HIGH (ref 70–99)
Potassium: 2.8 mmol/L — ABNORMAL LOW (ref 3.5–5.1)
Sodium: 131 mmol/L — ABNORMAL LOW (ref 135–145)

## 2020-05-17 MED ORDER — ADULT MULTIVITAMIN W/MINERALS CH
1.0000 | ORAL_TABLET | Freq: Every day | ORAL | Status: DC
  Administered 2020-05-17 – 2020-05-30 (×14): 1 via ORAL
  Filled 2020-05-17 (×14): qty 1

## 2020-05-17 MED ORDER — KATE FARMS STANDARD 1.4 PO LIQD: 325.0000 mL | Freq: Every day | ORAL | Status: DC

## 2020-05-17 MED ORDER — POTASSIUM CHLORIDE 10 MEQ/100ML IV SOLN
10.0000 meq | INTRAVENOUS | Status: AC
  Administered 2020-05-17 (×4): 10 meq via INTRAVENOUS
  Filled 2020-05-17 (×4): qty 100

## 2020-05-17 MED ORDER — BOOST / RESOURCE BREEZE PO LIQD CUSTOM
1.0000 | ORAL | Status: DC
  Administered 2020-05-18 – 2020-05-23 (×5): 1 via ORAL

## 2020-05-17 NOTE — Progress Notes (Signed)
Nutrition Follow-up  DOCUMENTATION CODES:   Non-severe (moderate) malnutrition in context of acute illness/injury  INTERVENTION:  - continue 30 ml Prosource Plus BID, each supplement provides 100 kcal and 15 grams protein.  - will order Boost Breeze once/day, each supplement provides 250 kcal and 9 grams of protein - will order Magic Cup with dinner meals, each supplement provides 290 kcal and 9 grams of protein. - will order 1 tablet multivitamin with minerals/day.    NUTRITION DIAGNOSIS:   Moderate Malnutrition related to acute illness, catabolic illness ([id number] infection) as evidenced by mild fat depletion, mild muscle depletion, moderate fat depletion, moderate muscle depletion. -revised, ongoing  GOAL:   Patient will meet greater than or equal to 90% of their needs -unmet at this time  MONITOR:   PO intake, Supplement acceptance, Labs, Weight trends  ASSESSMENT:   64 y.o. male with medical history of asthma, obesity, sleep apnea, and type 2 DM. He was admitted with severe shortness of breath, cough, and fevers of 102-104 at home. His symptoms began on 9/13. His wife and mother-in-law also presented at the same time as him for similar symptoms. He is not vaccinated against COVID-19. He was found to be COVID positive in the ED.  Patient is now off of COVID precautions. Diet advanced from FLD to Heart Healthy/Carb Modified on 10/9 at 0900 and patient has been eating 50-100% of meals since that time.   He has been accepting Prosource Plus ~50% of the time offered. RN reports that patient was ordered [employer] earlier in hospitalization and that patient will no longer accept this supplement. She thinks he will be open to [employer].  Patient laying in bed and just finished lunch. He consumed 100% of the meal. Patient becomes short of breath with minimal talking so interaction with patient was fairly brief.   He states that sores in his mouth/on his tongue are improving and  that sometimes they bother him and sometimes they do not. Mycostatin has been helpful when sores are painful.   Weight has been stable over the past 4 days. Was previously stable 10/1-10/8. Lasix order was placed on 10/7.   Labs reviewed; CBG: 102 mg/dl, Na: 131 mmol/l, K: 2.8 mmol/l, Cl: 87 mmol/l, BUN: 33 mg/dl, Ca: 8.4 mg/dl. Medications reviewed; 40 mg IV lasix/day, sliding scale novolog, 5 units novolog TID, 18 units levemir BID, 5 mg tradjenta/day, 5 ml mycostatin QID, 40 mg oral protonix/day, 10 mEq IV KCl x4 runs 10/13, 2 tablets senokot BID, 220 mg zinc sulfate/day.    NUTRITION - FOCUSED PHYSICAL EXAM:    Most Recent Value  Orbital Region Mild depletion  Upper Arm Region Moderate depletion  Thoracic and Lumbar Region Unable to assess  Buccal Region Mild depletion  Temple Region No depletion  Clavicle Bone Region Mild depletion  Clavicle and Acromion Bone Region Moderate depletion  Scapular Bone Region Unable to assess  Dorsal Hand Mild depletion  Patellar Region Unable to assess  Anterior Thigh Region Unable to assess  Posterior Calf Region Unable to assess  Edema (RD Assessment) Unable to assess  Hair Reviewed  Eyes Reviewed  Mouth Reviewed  Skin Reviewed  Nails Reviewed       Diet Order:   Diet Order            Diet heart healthy/carb modified Room service appropriate? Yes; Fluid consistency: Thin  Diet effective now                 EDUCATION  NEEDS:   No education needs have been identified at this time  Skin:  Skin Assessment: Reviewed RN Assessment  Last BM:  10/12  Height:   Ht Readings from Last 1 Encounters:  04/24/20 5\' 9"  (1.753 m)    Weight:   Wt Readings from Last 1 Encounters:  05/17/20 92.3 kg     Estimated Nutritional Needs:  Kcal:  2400-2600 kcal Protein:  120-130 grams Fluid:  >/= 2 L/day     Jarome Matin, MS, RD, LDN, CNSC Inpatient Clinical Dietitian RD pager # available in AMION  After hours/weekend pager #  available in [facility]

## 2020-05-17 NOTE — Progress Notes (Addendum)
[facility]  PROGRESS NOTE  Clayton [name] OFB:[id number] DOB: [birth year] DOA: 04/24/2020 PCP: Kristen Loader, FNP   Brief HPI:   64 year old male with past medical history of asthma, OSA not on CPAP, diabetes mellitus type 2, obesity, depression was admitted to ED on 04/24/2020 with acute hypoxic respiratory failure.  COVID-19 PCR was positive.  Chest x-ray showed bilateral multifocal pneumonia.  Patient completed 5 days of IV remdesivir and 14 days course of oral baricitinib.  Also was treated with high-dose steroids.  Patient continues to have high flow oxygen requirement.    Subjective   Patient seen and examined, drowsy but easily arousable.  Denies shortness of breath.  He has been coughing up phlegm.   Assessment/Plan:     1. Acute hypoxemic respiratory failure-secondary to COVID-19 multifocal pneumonia.  Patient completed 5 days of IV remdesivir, 14 days of oral baricitinib.  Continue methylprednisolone 40 mg IV every 12 hours.  Continue furosemide 40 mg IV daily.  Chest x-ray on 05/17/2020 shows multifocal pneumonia.  CRP is down to 0.8.  Continue incentive spirometry, prone positioning, flutter valve.  Is off airborne isolation as it has been more than 3 weeks since his first diagnosis of COVID-19 pneumonia.  WBC still 21,000 likely from steroids.  Follow CBC in a.m. 2. Diabetes mellitus type 2-hemoglobin A1c 8.4.  Continue Levemir 22 units subcu twice a day.  Continue premeal NovoLog 8 units subcu 3 times daily. 3. OSA-continue CPAP 4. Acute bilateral lower extremity DVTs-continue Xarelto 5. Depression/anxiety-continue as needed Ativan 6. Chronic diastolic CHF-echo from 7/82/4235 showed EF 60 to 36%, grade 1 diastolic dysfunction.  Continue Lasix as above. 7. Hypokalemia-potassium is 2.8, replace potassium and follow BMP in am.     COVID-19 Labs  Recent Labs    05/15/20 0659 05/16/20 0247 05/17/20 0246  CRP 1.7* 1.1* 0.8    Lab Results  Component Value Date    SARSCOV2NAA POSITIVE (A) 04/24/2020     Scheduled medications:   . (feeding supplement) PROSource Plus  30 mL Oral BID BM  . vitamin C  500 mg Oral Daily  . benzonatate  100 mg Oral TID  . Chlorhexidine Gluconate Cloth  6 each Topical Daily  . [START ON 05/18/2020] feeding supplement  1 Container Oral Q24H  . furosemide  40 mg Intravenous Daily  . guaiFENesin  600 mg Oral BID  . insulin aspart  0-20 Units Subcutaneous TID WC  . insulin aspart  0-5 Units Subcutaneous QHS  . insulin aspart  5 Units Subcutaneous TID WC  . insulin detemir  18 Units Subcutaneous BID  . Ipratropium-Albuterol  1 puff Inhalation QID  . linagliptin  5 mg Oral Daily  . losartan  50 mg Oral Daily  . magic mouthwash w/lidocaine  10 mL Oral QID  . mouth rinse  15 mL Mouth Rinse BID  . methylPREDNISolone (SOLU-MEDROL) injection  40 mg Intravenous Q12H  . montelukast  10 mg Oral QHS  . multivitamin with minerals  1 tablet Oral Daily  . nystatin  5 mL Oral QID  . pantoprazole  40 mg Oral Daily  . QUEtiapine  25 mg Oral QHS  . Rivaroxaban  15 mg Oral BID WC   Followed by  . [START ON 05/22/2020] rivaroxaban  20 mg Oral Q supper  . senna-docusate  2 tablet Oral BID  . sertraline  100 mg Oral Daily  . valACYclovir  1,000 mg Oral Daily  . zinc sulfate  220 mg Oral Daily  CBG: Recent Labs  Lab 05/16/20 1208 05/16/20 1642 05/16/20 2117 05/17/20 0759 05/17/20 1209  GLUCAP 256* 82 147* 102* 91    SpO2: (!) 85 % O2 Flow Rate (L/min): 8 L/min FiO2 (%): 100 %    CBC: Recent Labs  Lab 05/11/20 0237 05/11/20 1446  WBC 24.2* 21.7*  NEUTROABS 22.8* 19.9*  HGB 13.3 13.2  HCT 40.4 40.8  MCV 91.4 92.9  PLT 292 952    Basic Metabolic Panel: Recent Labs  Lab 05/12/20 0255 05/12/20 0255 05/13/20 0240 05/14/20 0248 05/15/20 0659 05/16/20 0247 05/17/20 0246  NA 135   < > 133* 132* 132* 130* 131*  K 4.5   < > 3.9 3.5 3.2* 3.5 2.8*  CL 89*   < > 88* 88* 87* 85* 87*  CO2 32   < > 33*  34* 34* 32 32  GLUCOSE 138*   < > 132* 166* 103* 235* 129*  BUN 23   < > 21 29* 29* 34* 33*  CREATININE 0.72   < > 0.55* 0.70 0.59* 0.82 0.79  CALCIUM 8.5*   < > 8.6* 8.4* 8.4* 8.3* 8.4*  MG 2.6*  --   --   --   --  2.3  --   PHOS 3.4  --   --   --   --  3.1  --    < > = values in this interval not displayed.     Liver Function Tests: No results for input(s): AST, ALT, ALKPHOS, BILITOT, PROT, ALBUMIN in the last 168 hours.   Antibiotics: Anti-infectives (From admission, onward)   Start     Dose/Rate Route Frequency Ordered Stop   05/14/20 1130  valACYclovir (VALTREX) tablet 500 mg  Status:  Discontinued        500 mg Oral Daily 05/14/20 1038 05/14/20 1041   05/14/20 1130  valACYclovir (VALTREX) tablet 1,000 mg        1,000 mg Oral Daily 05/14/20 1041     04/25/20 1000  remdesivir 100 mg in sodium chloride 0.9 % 100 mL IVPB        100 mg 200 mL/hr over 30 Minutes Intravenous Daily 04/24/20 1547 04/28/20 0945   04/25/20 1000  remdesivir 100 mg in sodium chloride 0.9 % 100 mL IVPB  Status:  Discontinued       "Followed by" Linked Group Details   100 mg 200 mL/hr over 30 Minutes Intravenous Daily 04/24/20 1633 04/24/20 1635   04/24/20 1645  remdesivir 200 mg in sodium chloride 0.9% 250 mL IVPB  Status:  Discontinued       "Followed by" Linked Group Details   200 mg 580 mL/hr over 30 Minutes Intravenous Once 04/24/20 1633 04/24/20 1635   04/24/20 1630  remdesivir 200 mg in sodium chloride 0.9% 250 mL IVPB        200 mg 580 mL/hr over 30 Minutes Intravenous Once 04/24/20 1547 04/24/20 1802       DVT prophylaxis: Xarelto  Code Status: Full code  Family Communication: No family at bedside    Status is: Inpatient  Dispo: The patient is from: Home              Anticipated d/c is to: Home              Anticipated d/c date is: 05/24/2020              Patient currently not medically stable for discharge, requiring high flow oxygen  Barrier to discharge-still  requiring high  flow oxygen via nasal cannula     Consultants:  Critical care  Procedures:     Objective   Vitals:   05/17/20 0900 05/17/20 1000 05/17/20 1100 05/17/20 1200  BP: 97/62 117/64 126/83 95/60  Pulse: 69 72 96 93  Resp: (!) 6 20 20  (!) 23  Temp:    (!) 97.5 F (36.4 C)  TempSrc:    Oral  SpO2: 99% 96% (!) 78% (!) 85%  Weight:      Height:        Intake/Output Summary (Last 24 hours) at 05/17/2020 1320 Last data filed at 05/17/2020 1257 Gross per 24 hour  Intake 2995 ml  Output 1450 ml  Net 1545 ml    10/11 1901 - 10/13 0700 In: 480 [P.O.:480] Out: 2150 [Urine:2150]  Filed Weights   05/15/20 0500 05/16/20 0500 05/17/20 0625  Weight: 91 kg 91.9 kg 92.3 kg    Physical Examination:    General: Appears in no acute distress  Cardiovascular: S1-S2, regular  Respiratory: Decreased breath sounds bilaterally  Abdomen: Abdomen is soft, nontender, no organomegaly  Extremities: No edema in the lower extremities  Neurologic: Alert, oriented x3, no focal deficit noted    Data Reviewed:   No results found for this or any previous visit (from the past 240 hour(s)).  No results for input(s): LIPASE, AMYLASE in the last 168 hours. No results for input(s): AMMONIA in the last 168 hours.  Cardiac Enzymes: No results for input(s): CKTOTAL, CKMB, CKMBINDEX, TROPONINI in the last 168 hours. BNP (last 3 results) Recent Labs    05/08/20 0345  BNP 44.5    ProBNP (last 3 results) No results for input(s): PROBNP in the last 8760 hours.  Studies:  DG CHEST PORT 1 VIEW  Result Date: 05/17/2020 CLINICAL DATA:  COVID-19. EXAM: PORTABLE CHEST 1 VIEW COMPARISON:  May 12, 2020. FINDINGS: Stable cardiomediastinal silhouette. Hypoinflation of the lungs is noted. Stable diffuse airspace opacities are noted concerning for multifocal pneumonia. No pneumothorax or significant pleural effusion is noted. Bony thorax is unremarkable. IMPRESSION: Stable diffuse airspace  opacities are noted concerning for multifocal pneumonia. Electronically Signed   By: Marijo Conception M.D.   On: 05/17/2020 08:15       [place]   [facility] If 7PM-7AM, please contact night-coverage at [email or web address], Office  [phone]   05/17/2020, 1:20 PM  LOS: 23 days

## 2020-05-17 NOTE — TOC Progression Note (Addendum)
Transition of Care [facility]) - Progression Note    Patient Details  Name: Clayton [name] MRN: [id number] Date of Birth: [birth year]  Transition of [facility]) CM/SW Contact  Leeroy Cha, RN Phone Number: 05/17/2020, 8:50 AM  Clinical Narrative:     64 y.o. male with PMH of asthma, OSA not on CPAP, type II DM, obesity, depression. Patient was brought to the ED on 9/20 with complaint of cough, shortness of breath and fever of 102-104 at home. Not vaccinated against Covid.  In the ED, he was noted to be severely hypoxic to 72% on room air and required up to 15 L of oxygen by nasal cannula. COVID-19 PCR positive. Chest x-ray showed bilateral multifocal pneumonia. He was also noted to have bilateral DVT.  Referral made to both ltach's for review of case and possible acceptance.  Expected Discharge Plan: Home/Self Care Barriers to Discharge: Continued Medical Work up  Expected Discharge Plan and Services Expected Discharge Plan: Home/Self Care   Discharge Planning Services: CM Consult   Living arrangements for the past 2 months: Single Family Home                                       Social Determinants of Health (SDOH) Interventions    Readmission Risk Interventions No flowsheet data found.

## 2020-05-17 NOTE — Progress Notes (Signed)
Physical Therapy Treatment Patient Details Name: Clayton [name] MRN: [id number] DOB: 12/01/1955 Today's Date: 05/17/2020    History of Present Illness 64 yo male admitted with COVID, bil DVT. Hx of asthma, obesity, DM.    PT Comments    The patient declined to get OOB. Patient did perform U/LE resistive exercises. Patient is on 8 L HFNC , SPO2 at rest 90%. During exercises, noted to drop down to 80%. Patient frequently uses NRB with not SPO2 back  Into mid 90's. Patient's progress remains  Slow due to respiratory distress with activity.   Now off of droplet, tomorrow  will see if patient will consent to getting into [facility] and out of  The room. Continue  Progressive PT.    Follow Up Recommendations  CIR;SNF     Equipment Recommendations   (tba)    Recommendations for Other Services       Precautions / Restrictions Precautions Precautions: Fall Precaution Comments: very weak, desats quickly, still needsd NRB to recover    Mobility  Bed Mobility               General bed mobility comments: declined OOB  Transfers                    Ambulation/Gait                 Stairs             Wheelchair Mobility    Modified Rankin (Stroke Patients Only)       Balance                                            Cognition Arousal/Alertness: Awake/alert                                     General Comments: less anxious,      Exercises General Exercises - Upper Extremity Shoulder Flexion: AROM;Strengthening;Both;10 reps;Supine Shoulder ABduction: AROM;Strengthening;Both;10 reps;Theraband Theraband Level (Shoulder Abduction): Level 3 (Green) Shoulder Horizontal ABduction: AROM;Strengthening;Both;10 reps;Supine Elbow Flexion: AROM;Supine;Strengthening;Both;10 reps;Theraband Theraband Level (Elbow Flexion): Level 3 (Green) General Exercises - Lower Extremity Short Arc Quad: AROM;Strengthening;Both;10  reps;Supine Heel Slides: AROM;Both;10 reps;Supine;Strengthening Hip ABduction/ADduction: AROM;Strengthening;10 reps;Both;Supine    General Comments        Pertinent Vitals/Pain Pain Assessment: Faces Faces Pain Scale: Hurts little more Pain Location: mouth Pain Descriptors / Indicators: Discomfort Pain Intervention(s): Monitored during session    Home Living                      Prior Function            PT Goals (current goals can now be found in the care plan section) Progress towards PT goals: Progressing toward goals    Frequency    Min 3X/week      PT Plan Current plan remains appropriate    Co-evaluation              AM-PAC PT "6 Clicks" Mobility   Outcome Measure  Help needed turning from your back to your side while in a flat bed without using bedrails?: A Little Help needed moving from lying on your back to sitting on the side of a flat bed without using bedrails?: A Little Help needed moving  to and from a bed to a chair (including a wheelchair)?: A Lot Help needed standing up from a chair using your arms (e.g., wheelchair or bedside chair)?: A Lot Help needed to walk in hospital room?: Total Help needed climbing 3-5 steps with a railing? : Total 6 Click Score: 12    End of Session Equipment Utilized During Treatment: Oxygen Activity Tolerance: Patient tolerated treatment well Patient left: in bed;with call bell/phone within reach Nurse Communication: Mobility status PT Visit Diagnosis: Difficulty in walking, not elsewhere classified (R26.2)     Time: 6314-9702 PT Time Calculation (min) (ACUTE ONLY): 15 min  Charges:  $Therapeutic Exercise: 8-22 mins                     [place] Pager [phone] Office [phone]   Claretha Cooper 05/17/2020, 1:54 PM

## 2020-05-18 DIAGNOSIS — E440 Moderate protein-calorie malnutrition: Secondary | ICD-10-CM | POA: Insufficient documentation

## 2020-05-18 DIAGNOSIS — E1165 Type 2 diabetes mellitus with hyperglycemia: Secondary | ICD-10-CM | POA: Diagnosis not present

## 2020-05-18 DIAGNOSIS — J9601 Acute respiratory failure with hypoxia: Secondary | ICD-10-CM | POA: Diagnosis not present

## 2020-05-18 DIAGNOSIS — U071 COVID-19: Secondary | ICD-10-CM | POA: Diagnosis not present

## 2020-05-18 DIAGNOSIS — J189 Pneumonia, unspecified organism: Secondary | ICD-10-CM | POA: Diagnosis not present

## 2020-05-18 LAB — COMPREHENSIVE METABOLIC PANEL
ALT: 34 U/L (ref 0–44)
AST: 23 U/L (ref 15–41)
Albumin: 2.6 g/dL — ABNORMAL LOW (ref 3.5–5.0)
Alkaline Phosphatase: 63 U/L (ref 38–126)
Anion gap: 10 (ref 5–15)
BUN: 27 mg/dL — ABNORMAL HIGH (ref 8–23)
CO2: 31 mmol/L (ref 22–32)
Calcium: 8 mg/dL — ABNORMAL LOW (ref 8.9–10.3)
Chloride: 91 mmol/L — ABNORMAL LOW (ref 98–111)
Creatinine, Ser: 0.76 mg/dL (ref 0.61–1.24)
GFR, Estimated: >60 mL/min (ref >60)
Glucose, Bld: 129 mg/dL — ABNORMAL HIGH (ref 70–99)
Potassium: 3.2 mmol/L — ABNORMAL LOW (ref 3.5–5.1)
Sodium: 132 mmol/L — ABNORMAL LOW (ref 135–145)
Total Bilirubin: 0.4 mg/dL (ref 0.3–1.2)
Total Protein: 5.8 g/dL — ABNORMAL LOW (ref 6.5–8.1)

## 2020-05-18 LAB — GLUCOSE, CAPILLARY
Glucose-Capillary: 127 mg/dL — ABNORMAL HIGH (ref 70–99)
Glucose-Capillary: 138 mg/dL — ABNORMAL HIGH (ref 70–99)
Glucose-Capillary: 123 mg/dL — ABNORMAL HIGH (ref 70–99)
Glucose-Capillary: 150 mg/dL — ABNORMAL HIGH (ref 70–99)

## 2020-05-18 LAB — CBC
HCT: 35.1 % — ABNORMAL LOW (ref 39.0–52.0)
Hemoglobin: 11.7 g/dL — ABNORMAL LOW (ref 13.0–17.0)
MCH: 30 pg (ref 26.0–34.0)
MCHC: 33.3 g/dL (ref 30.0–36.0)
MCV: 90 fL (ref 80.0–100.0)
Platelets: 203 10*3/uL (ref 150–400)
RBC: 3.9 MIL/uL — ABNORMAL LOW (ref 4.22–5.81)
RDW: 12.9 % (ref 11.5–15.5)
WBC: 12.4 10*3/uL — ABNORMAL HIGH (ref 4.0–10.5)
nRBC: 0 % (ref 0.0–0.2)

## 2020-05-18 LAB — C-REACTIVE PROTEIN: CRP: 0.8 mg/dL (ref <1.0)

## 2020-05-18 MED ORDER — POTASSIUM CHLORIDE 10 MEQ/100ML IV SOLN
10.0000 meq | INTRAVENOUS | Status: AC
  Administered 2020-05-18 (×2): 10 meq via INTRAVENOUS
  Filled 2020-05-18 (×2): qty 100

## 2020-05-18 MED ORDER — POTASSIUM CHLORIDE CRYS ER 20 MEQ PO TBCR
40.0000 meq | EXTENDED_RELEASE_TABLET | Freq: Once | ORAL | Status: AC
  Administered 2020-05-18: 40 meq via ORAL
  Filled 2020-05-18: qty 2

## 2020-05-18 MED ORDER — TRAZODONE HCL 50 MG PO TABS
50.0000 mg | ORAL_TABLET | Freq: Every evening | ORAL | Status: AC | PRN
  Administered 2020-05-18: 50 mg via ORAL
  Filled 2020-05-18: qty 1

## 2020-05-18 MED ORDER — FUROSEMIDE 10 MG/ML IJ SOLN
40.0000 mg | Freq: Two times a day (BID) | INTRAMUSCULAR | Status: DC
  Administered 2020-05-18: 40 mg via INTRAVENOUS
  Filled 2020-05-18: qty 4

## 2020-05-18 MED ORDER — FUROSEMIDE 10 MG/ML IJ SOLN
40.0000 mg | Freq: Two times a day (BID) | INTRAMUSCULAR | Status: DC
  Administered 2020-05-19 – 2020-05-23 (×9): 40 mg via INTRAVENOUS
  Filled 2020-05-18 (×9): qty 4

## 2020-05-18 NOTE — TOC Progression Note (Addendum)
Transition of Care [facility]) - Progression Note    Patient Details  Name: Clayton [name] MRN: [id number] Date of Birth: 27-Mar-1956  Transition of Care [facility]) CM/SW Contact  Leeroy Cha, RN Phone Number: 05/18/2020, 8:49 AM  Clinical Narrative:    Message left on the voice mail at [phone] for the wife to call back and discuss ltach preferences . [facility] [name] if he goes to a [facility]. erica [facility] notified.  Expected Discharge Plan: Home/Self Care Barriers to Discharge: Continued Medical Work up  Expected Discharge Plan and Services Expected Discharge Plan: Home/Self Care   Discharge Planning Services: CM Consult   Living arrangements for the past 2 months: Single Family Home                                       Social Determinants of Health (SDOH) Interventions    Readmission Risk Interventions No flowsheet data found.

## 2020-05-18 NOTE — Progress Notes (Signed)
Physical Therapy Treatment Patient Details Name: Clayton [name] MRN: [id number] DOB: [birth year] Today's Date: 05/18/2020    History of Present Illness 64 yo male admitted with COVID, bil DVT. Hx of asthma, obesity, DM.    PT Comments    Goals of treatment for patient were to get into WC, roll into BR for simple ADL's then roll around the unit , Out of his room. Patient assisted to [place], rolled into BR. Patient extremely anxious, using HFNC @15  L and partial NRB. RR up to 48,SPO2 dropping to 78%.(ear sensor). Encouraged slow breaths. Patient still with Rapid RR and fidgety and indicating return to bed. Assisted back to bed , min assist today to transfer. RN in room to monitor SPO2 and adjust oxygen when pt. Calms down. Patient on 15 L HFNC and NRB with SPO2 back to 88% after ~ 3 minutes. Spoke to RN about antianxiety meds available prior to attempting increased mobility. She will communicate with MD.  Follow Up Recommendations  LTACH     Equipment Recommendations  None recommended by PT    Recommendations for Other Services       Precautions / Restrictions Precautions Precautions: Fall Precaution Comments: very weak, desats quickly, still needsd NRB to recover, HIGHLY anxious Restrictions Weight Bearing Restrictions: No    Mobility  Bed Mobility Overal bed mobility: Needs Assistance Bed Mobility: Supine to Sit;Sit to Supine     Supine to sit: Min guard Sit to supine: Min guard   General bed mobility comments: min guard for supine to sit. On return to bed patient required min guard but unsafe with transfer due to anxiety.  Transfers   Equipment used: None     Stand pivot transfers: Min assist;+2 safety/equipment       General transfer comment: min assist to stand pivot to wc and back to bed.  Ambulation/Gait                 Stairs             Wheelchair Mobility    Modified Rankin (Stroke Patients Only)       Balance Overall balance assessment:  Mild deficits observed, not formally tested                                          Cognition Arousal/Alertness: Awake/alert Behavior During Therapy: Anxious Overall Cognitive Status: Within Functional Limits for tasks assessed                                        Exercises      General Comments        Pertinent Vitals/Pain Pain Assessment: Faces Faces Pain Scale: Hurts a little bit Pain Location: mouth Pain Descriptors / Indicators: Discomfort    Home Living                      Prior Function            PT Goals (current goals can now be found in the care plan section) Acute Rehab PT Goals Patient Stated Goal: To get back to bed Progress towards PT goals: Progressing toward goals    Frequency    Min 3X/week      PT Plan Discharge plan needs to be updated  Co-evaluation PT/OT/SLP Co-Evaluation/Treatment: Yes Reason for Co-Treatment: Complexity of the patient's impairments (multi-system involvement);Necessary to address cognition/behavior during functional activity;For patient/therapist safety PT goals addressed during session: Mobility/safety with mobility OT goals addressed during session: ADL's and self-care      AM-PAC PT "6 Clicks" Mobility   Outcome Measure  Help needed turning from your back to your side while in a flat bed without using bedrails?: A Little Help needed moving from lying on your back to sitting on the side of a flat bed without using bedrails?: A Little Help needed moving to and from a bed to a chair (including a wheelchair)?: A Lot Help needed standing up from a chair using your arms (e.g., wheelchair or bedside chair)?: A Lot Help needed to walk in hospital room?: Total Help needed climbing 3-5 steps with a railing? : Total 6 Click Score: 12    End of Session   Activity Tolerance: Treatment limited secondary to medical complications (Comment) Patient left: in bed;with call  bell/phone within reach Nurse Communication: Mobility status       Time: 6073-7106 PT Time Calculation (min) (ACUTE ONLY): 28 min  Charges:  $Therapeutic Activity: 8-22 mins                     [place] Pager [phone] Office [phone]    Claretha Cooper 05/18/2020, 1:36 PM

## 2020-05-18 NOTE — Progress Notes (Signed)
BP 86/62. Dr. Darrick Meigs notified and stated to hold lasix dose tonight.

## 2020-05-18 NOTE — Progress Notes (Signed)
Occupational Therapy Treatment Patient Details Name: Clayton [name] MRN: [id number] DOB: [birth year] Today's Date: 05/18/2020    History of present illness 64 yo male admitted with COVID, bil DVT. Hx of asthma, obesity, DM.   OT comments  . Patient min guard for bed mobility and min assist to pivot to wheelchair. Attempted to have patient perform grooming in bathroom from wc level on 15 L hFNC and 15 L NRB - however patient became very anxious once in the bathroom and could not tolerate environment. Patient returned to supine in bed due to anxiety and feelings of dyspnea. Significant increase in time for treatment due to attempts at slowing patient's respirations and calming him down in supine, in sitting, in wc and in return to bed. Patient's o2 sat dropped as low as 76% and RR up to 44 at one point. Required +2 for safety, line and oxygen management and assistance to manage patient's anxiety.    Follow Up Recommendations  SNF;LTACH    Equipment Recommendations  Tub/shower seat    Recommendations for Other Services      Precautions / Restrictions Precautions Precautions: Fall Precaution Comments: very weak, desats quickly, still needsd NRB to recover, HIGHLY anxious Restrictions Weight Bearing Restrictions: No       Mobility Bed Mobility Overal bed mobility: Needs Assistance Bed Mobility: Supine to Sit;Sit to Supine     Supine to sit: Min guard Sit to supine: Min guard   General bed mobility comments: min guard for supine to sit. On return to bed patient required min guard but unsafe with transfer due to anxiety.  Transfers   Equipment used: None     Stand pivot transfers: Min assist;+2 safety/equipment       General transfer comment: min assist to stand pivot to wc and back to bed.    Balance Overall balance assessment: Mild deficits observed, not formally tested                                         ADL either performed or assessed with  clinical judgement   ADL   Eating/Feeding: Set up Eating/Feeding Details (indicate cue type and reason): able to drink from cup seated.                                         Vision   Vision Assessment?: No apparent visual deficits   Perception     Praxis      Cognition Arousal/Alertness: Awake/alert Behavior During Therapy: Anxious Overall Cognitive Status: Within Functional Limits for tasks assessed                                          Exercises     Shoulder Instructions       General Comments      Pertinent Vitals/ Pain       Pain Assessment: Faces Faces Pain Scale: Hurts a little bit Pain Location: mouth Pain Descriptors / Indicators: Discomfort  Home Living  Prior Functioning/Environment              Frequency  Min 2X/week        Progress Toward Goals  OT Goals(current goals can now be found in the care plan section)  Progress towards OT goals: Progressing toward goals  Acute Rehab OT Goals Patient Stated Goal: To get back to bed OT Goal Formulation: With patient Time For Goal Achievement: 05/24/20 Potential to Achieve Goals: [place] Discharge plan needs to be updated    Co-evaluation    PT/OT/SLP Co-Evaluation/Treatment: Yes Reason for Co-Treatment: For patient/therapist safety;To address functional/ADL transfers;Necessary to address cognition/behavior during functional activity PT goals addressed during session: Mobility/safety with mobility OT goals addressed during session: ADL's and self-care      AM-PAC OT "6 Clicks" Daily Activity     Outcome Measure   Help from another person eating meals?: None Help from another person taking care of personal grooming?: A Little Help from another person toileting, which includes using toliet, bedpan, or urinal?: A Lot Help from another person bathing (including washing, rinsing, drying)?: A  Lot Help from another person to put on and taking off regular upper body clothing?: A Little Help from another person to put on and taking off regular lower body clothing?: A Lot 6 Click Score: 16    End of Session Equipment Utilized During Treatment: Oxygen  OT Visit Diagnosis: Other abnormalities of gait and mobility (R26.89);Muscle weakness (generalized) (M62.81)   Activity Tolerance Patient limited by fatigue;Other (comment) (anxiety)   Patient Left in bed;with call bell/phone within reach   Nurse Communication Mobility status (anxiety,)        Time: 1856-3149 OT Time Calculation (min): 28 min  Charges: OT General Charges $OT Visit: 1 Visit OT Treatments $Therapeutic Activity: 8-22 mins  Tarrin Menn, OTR/L [place]  Office [phone] Pager: [place] 05/18/2020, 12:41 PM

## 2020-05-18 NOTE — Progress Notes (Signed)
Inpatient Rehab Admissions Coordinator:   Pt. Continues to require HFNC and intermittently NRB during therapies. PT/OT now recommending LTACH. I agree that this is a more appropriate disposition, as Pt. Must be able to tolerate intensive therapies on no more than 6 L of O2. CIR will sign off.   Clemens Catholic, [place], [place] Admissions Coordinator  [phone] ([place]) [phone] (office)

## 2020-05-18 NOTE — Progress Notes (Addendum)
[facility]  PROGRESS NOTE  Clayton [name] RJJ:[id number] DOB: [birth year] DOA: 04/24/2020 PCP: Kristen Loader, FNP   Brief HPI:   64 year old male with past medical history of asthma, OSA not on CPAP, diabetes mellitus type 2, obesity, depression was admitted to ED on 04/24/2020 with acute hypoxic respiratory failure.  COVID-19 PCR was positive.  Chest x-ray showed bilateral multifocal pneumonia.  Patient completed 5 days of IV remdesivir and 14 days course of oral baricitinib.  Also was treated with high-dose steroids.  Patient continues to have high flow oxygen requirement.    Subjective   Patient seen and examined, feels better than yesterday.  Coughing up phlegm.  Still requiring 10 L/min oxygen via nasal cannula   Assessment/Plan:     1. Acute hypoxemic respiratory failure-secondary to COVID-19 multifocal pneumonia.  Patient completed 5 days of IV remdesivir, 14 days of oral baricitinib.  Patient continues to require oxygen 10 to 15 L/min.  He is currently on Solu-Medrol 40 mg IV every 12 hours and Lasix 40 mg IV daily.  Chest x-ray on 05/17/2020 showed multifocal pneumonia.  I will increase the dose of Lasix to 40 mg IV every 12 hourly.  We will follow BMP in am.  CRP is down to 0.8.  WBC is down to 12.4.  Clinically much improved. 2. Diabetes mellitus type 2-hemoglobin A1c 8.4.  Continue Levemir 22 units subcu twice a day, linagliptin.  Continue premeal NovoLog 8 units subcu 3 times daily.  CBG fairly well controlled. 3. OSA-continue CPAP 4. Acute bilateral lower extremity DVTs-continue Xarelto 5. Depression/anxiety-continue as needed Ativan 6. Chronic diastolic CHF-echo from 0/16/0109 showed EF 60 to 32%, grade 1 diastolic dysfunction.  Continue Lasix as above. 7. Hypokalemia-potassium is still 3.2.  Will replace potassium and follow BMP in am.     COVID-19 Labs  Recent Labs    05/16/20 0247 05/17/20 0246 05/18/20 0254  CRP 1.1* 0.8 0.8    Lab Results  Component  Value Date   SARSCOV2NAA POSITIVE (A) 04/24/2020     Scheduled medications:   . (feeding supplement) PROSource Plus  30 mL Oral BID BM  . vitamin C  500 mg Oral Daily  . benzonatate  100 mg Oral TID  . Chlorhexidine Gluconate Cloth  6 each Topical Daily  . feeding supplement  1 Container Oral Q24H  . furosemide  40 mg Intravenous Q12H  . guaiFENesin  600 mg Oral BID  . insulin aspart  0-20 Units Subcutaneous TID WC  . insulin aspart  0-5 Units Subcutaneous QHS  . insulin aspart  5 Units Subcutaneous TID WC  . insulin detemir  18 Units Subcutaneous BID  . Ipratropium-Albuterol  1 puff Inhalation QID  . linagliptin  5 mg Oral Daily  . losartan  50 mg Oral Daily  . magic mouthwash w/lidocaine  10 mL Oral QID  . mouth rinse  15 mL Mouth Rinse BID  . methylPREDNISolone (SOLU-MEDROL) injection  40 mg Intravenous Q12H  . montelukast  10 mg Oral QHS  . multivitamin with minerals  1 tablet Oral Daily  . nystatin  5 mL Oral QID  . pantoprazole  40 mg Oral Daily  . QUEtiapine  25 mg Oral QHS  . Rivaroxaban  15 mg Oral BID WC   Followed by  . [START ON 05/22/2020] rivaroxaban  20 mg Oral Q supper  . senna-docusate  2 tablet Oral BID  . sertraline  100 mg Oral Daily  . valACYclovir  1,000 mg Oral Daily  .  zinc sulfate  220 mg Oral Daily         CBG: Recent Labs  Lab 05/17/20 0759 05/17/20 1209 05/17/20 1534 05/17/20 2102 05/18/20 0811  GLUCAP 102* 91 150* 209* 127*    SpO2: 94 % O2 Flow Rate (L/min): (S) 10 L/min FiO2 (%): 100 %    CBC: Recent Labs  Lab 05/11/20 1446 05/18/20 0254  WBC 21.7* 12.4*  NEUTROABS 19.9*  --   HGB 13.2 11.7*  HCT 40.8 35.1*  MCV 92.9 90.0  PLT 303 127    Basic Metabolic Panel: Recent Labs  Lab 05/12/20 0255 05/13/20 0240 05/14/20 0248 05/15/20 0659 05/16/20 0247 05/17/20 0246 05/18/20 0254  NA 135   < > 132* 132* 130* 131* 132*  K 4.5   < > 3.5 3.2* 3.5 2.8* 3.2*  CL 89*   < > 88* 87* 85* 87* 91*  CO2 32   < > 34* 34* 32  32 31  GLUCOSE 138*   < > 166* 103* 235* 129* 129*  BUN 23   < > 29* 29* 34* 33* 27*  CREATININE 0.72   < > 0.70 0.59* 0.82 0.79 0.76  CALCIUM 8.5*   < > 8.4* 8.4* 8.3* 8.4* 8.0*  MG 2.6*  --   --   --  2.3  --   --   PHOS 3.4  --   --   --  3.1  --   --    < > = values in this interval not displayed.     Liver Function Tests: Recent Labs  Lab 05/18/20 0254  AST 23  ALT 34  ALKPHOS 63  BILITOT 0.4  PROT 5.8*  ALBUMIN 2.6*     Antibiotics: Anti-infectives (From admission, onward)   Start     Dose/Rate Route Frequency Ordered Stop   05/14/20 1130  valACYclovir (VALTREX) tablet 500 mg  Status:  Discontinued        500 mg Oral Daily 05/14/20 1038 05/14/20 1041   05/14/20 1130  valACYclovir (VALTREX) tablet 1,000 mg        1,000 mg Oral Daily 05/14/20 1041     04/25/20 1000  remdesivir 100 mg in sodium chloride 0.9 % 100 mL IVPB        100 mg 200 mL/hr over 30 Minutes Intravenous Daily 04/24/20 1547 04/28/20 0945   04/25/20 1000  remdesivir 100 mg in sodium chloride 0.9 % 100 mL IVPB  Status:  Discontinued       "Followed by" Linked Group Details   100 mg 200 mL/hr over 30 Minutes Intravenous Daily 04/24/20 1633 04/24/20 1635   04/24/20 1645  remdesivir 200 mg in sodium chloride 0.9% 250 mL IVPB  Status:  Discontinued       "Followed by" Linked Group Details   200 mg 580 mL/hr over 30 Minutes Intravenous Once 04/24/20 1633 04/24/20 1635   04/24/20 1630  remdesivir 200 mg in sodium chloride 0.9% 250 mL IVPB        200 mg 580 mL/hr over 30 Minutes Intravenous Once 04/24/20 1547 04/24/20 1802       DVT prophylaxis: Xarelto  Code Status: Full code  Family Communication: No family at bedside    Status is: Inpatient  Dispo: The patient is from: Home              Anticipated d/c is to: LTAC versus CIR              Anticipated d/c date  is: 05/24/2020              Patient currently not medically stable for discharge, requiring high flow oxygen  Barrier to  discharge-still requiring high flow oxygen via nasal cannula     Consultants:  Critical care  Procedures:     Objective   Vitals:   05/18/20 0300 05/18/20 0346 05/18/20 0400 05/18/20 0800  BP:   (!) 97/42   Pulse: 66  72   Resp: (!) 23  (!) 26   Temp:  (!) 97.5 F (36.4 C)  97.6 F (36.4 C)  TempSrc:  Axillary  Oral  SpO2: 93%  94%   Weight:      Height:        Intake/Output Summary (Last 24 hours) at 05/18/2020 0835 Last data filed at 05/18/2020 0813 Gross per 24 hour  Intake 3439.06 ml  Output 2775 ml  Net 664.06 ml    10/12 1901 - 10/14 0700 In: 3079.1 [P.O.:2955] Out: 3025 [PVVZS:8270]  Filed Weights   05/15/20 0500 05/16/20 0500 05/17/20 0625  Weight: 91 kg 91.9 kg 92.3 kg    Physical Examination:    General-appears in no acute distress  Heart-S1-S2, regular, no murmur auscultated  Lungs-bibasilar crackles auscultated  Abdomen-soft, nontender, no organomegaly  Extremities-no edema in the lower extremities  Neuro-alert, oriented x3, no focal deficit noted    Data Reviewed:   No results found for this or any previous visit (from the past 240 hour(s)).  No results for input(s): LIPASE, AMYLASE in the last 168 hours. No results for input(s): AMMONIA in the last 168 hours.  Cardiac Enzymes: No results for input(s): CKTOTAL, CKMB, CKMBINDEX, TROPONINI in the last 168 hours. BNP (last 3 results) Recent Labs    05/08/20 0345  BNP 44.5    ProBNP (last 3 results) No results for input(s): PROBNP in the last 8760 hours.  Studies:  DG CHEST PORT 1 VIEW  Result Date: 05/17/2020 CLINICAL DATA:  COVID-19. EXAM: PORTABLE CHEST 1 VIEW COMPARISON:  May 12, 2020. FINDINGS: Stable cardiomediastinal silhouette. Hypoinflation of the lungs is noted. Stable diffuse airspace opacities are noted concerning for multifocal pneumonia. No pneumothorax or significant pleural effusion is noted. Bony thorax is unremarkable. IMPRESSION: Stable diffuse  airspace opacities are noted concerning for multifocal pneumonia. Electronically Signed   By: Marijo Conception M.D.   On: 05/17/2020 08:15       [place]   [facility] If 7PM-7AM, please contact night-coverage at [email or web address], Office  [phone]   05/18/2020, 8:35 AM  LOS: 24 days

## 2020-05-19 DIAGNOSIS — U071 COVID-19: Secondary | ICD-10-CM | POA: Diagnosis not present

## 2020-05-19 DIAGNOSIS — J189 Pneumonia, unspecified organism: Secondary | ICD-10-CM | POA: Diagnosis not present

## 2020-05-19 DIAGNOSIS — J9601 Acute respiratory failure with hypoxia: Secondary | ICD-10-CM | POA: Diagnosis not present

## 2020-05-19 DIAGNOSIS — E1165 Type 2 diabetes mellitus with hyperglycemia: Secondary | ICD-10-CM | POA: Diagnosis not present

## 2020-05-19 LAB — GLUCOSE, CAPILLARY
Glucose-Capillary: 104 mg/dL — ABNORMAL HIGH (ref 70–99)
Glucose-Capillary: 123 mg/dL — ABNORMAL HIGH (ref 70–99)
Glucose-Capillary: 162 mg/dL — ABNORMAL HIGH (ref 70–99)
Glucose-Capillary: 197 mg/dL — ABNORMAL HIGH (ref 70–99)

## 2020-05-19 LAB — BASIC METABOLIC PANEL
Anion gap: 10 (ref 5–15)
BUN: 22 mg/dL (ref 8–23)
CO2: 30 mmol/L (ref 22–32)
Calcium: 8.3 mg/dL — ABNORMAL LOW (ref 8.9–10.3)
Chloride: 97 mmol/L — ABNORMAL LOW (ref 98–111)
Creatinine, Ser: 0.83 mg/dL (ref 0.61–1.24)
GFR, Estimated: >60 mL/min (ref >60)
Glucose, Bld: 180 mg/dL — ABNORMAL HIGH (ref 70–99)
Potassium: 3.7 mmol/L (ref 3.5–5.1)
Sodium: 137 mmol/L (ref 135–145)

## 2020-05-19 LAB — C-REACTIVE PROTEIN: CRP: 0.6 mg/dL (ref <1.0)

## 2020-05-19 LAB — CALCIUM, IONIZED: Calcium, Ionized, Serum: 4.9 mg/dL (ref 4.5–5.6)

## 2020-05-19 MED ORDER — PHENOL 1.4 % MT LIQD
1.0000 | OROMUCOSAL | Status: DC | PRN
  Administered 2020-05-19: 1 via OROMUCOSAL
  Filled 2020-05-19: qty 177

## 2020-05-19 NOTE — Progress Notes (Addendum)
[facility]  PROGRESS NOTE  Clayton [name] SKA:[id number] DOB: Feb 28, 1956 DOA: 04/24/2020 PCP: Kristen Loader, FNP   Brief HPI:   64 year old male with past medical history of asthma, OSA not on CPAP, diabetes mellitus type 2, obesity, depression was admitted to ED on 04/24/2020 with acute hypoxic respiratory failure.  COVID-19 PCR was positive.  Chest x-ray showed bilateral multifocal pneumonia.  Patient completed 5 days of IV remdesivir and 14 days course of oral baricitinib.  Also was treated with high-dose steroids.  Patient continues to have high flow oxygen requirement.    Subjective   Patient seen and examined, still requiring oxygen 10 to 15 L high flow nasal cannula.  Denies shortness of breath.  Lasix was held last night due to low blood pressure.   Assessment/Plan:     1. Acute hypoxemic respiratory failure-secondary to COVID-19 multifocal pneumonia.  Patient completed 5 days of IV remdesivir, 14 days of oral baricitinib.  Patient continues to require oxygen 10 to 15 L/min.  He is currently on Solu-Medrol 40 mg IV every 12 hours and Lasix dose was changed to 40 mg every 12 hours.  He is more than 3 weeks from day of diagnosis so he is currently off isolation.   Chest x-ray on 05/17/2020 showed multifocal pneumonia. CRP is down to 0.8.  WBC is down to 12.4.   2. Diabetes mellitus type 2-hemoglobin A1c 8.4.  Continue Levemir 22 units subcu twice a day, linagliptin.  Continue premeal NovoLog 8 units subcu 3 times daily.  CBG fairly well controlled. 3. OSA-continue CPAP 4. Acute bilateral lower extremity DVTs-continue Xarelto 5. Depression/anxiety-continue as needed Ativan 6. Chronic diastolic CHF-echo from 09/08/5595 showed EF 60 to 41%, grade 1 diastolic dysfunction.  Continue Lasix as above. 7. Hypokalemia-replete.     COVID-19 Labs  Recent Labs    05/17/20 0246 05/18/20 0254 05/19/20 0237  CRP 0.8 0.8 0.6    Lab Results  Component Value Date   SARSCOV2NAA  POSITIVE (A) 04/24/2020     Scheduled medications:   . (feeding supplement) PROSource Plus  30 mL Oral BID BM  . vitamin C  500 mg Oral Daily  . benzonatate  100 mg Oral TID  . Chlorhexidine Gluconate Cloth  6 each Topical Daily  . feeding supplement  1 Container Oral Q24H  . furosemide  40 mg Intravenous Q12H  . guaiFENesin  600 mg Oral BID  . insulin aspart  0-20 Units Subcutaneous TID WC  . insulin aspart  0-5 Units Subcutaneous QHS  . insulin aspart  5 Units Subcutaneous TID WC  . insulin detemir  18 Units Subcutaneous BID  . Ipratropium-Albuterol  1 puff Inhalation QID  . linagliptin  5 mg Oral Daily  . losartan  50 mg Oral Daily  . magic mouthwash w/lidocaine  10 mL Oral QID  . mouth rinse  15 mL Mouth Rinse BID  . methylPREDNISolone (SOLU-MEDROL) injection  40 mg Intravenous Q12H  . montelukast  10 mg Oral QHS  . multivitamin with minerals  1 tablet Oral Daily  . nystatin  5 mL Oral QID  . pantoprazole  40 mg Oral Daily  . QUEtiapine  25 mg Oral QHS  . Rivaroxaban  15 mg Oral BID WC   Followed by  . [START ON 05/22/2020] rivaroxaban  20 mg Oral Q supper  . senna-docusate  2 tablet Oral BID  . sertraline  100 mg Oral Daily  . valACYclovir  1,000 mg Oral Daily  . zinc sulfate  220 mg Oral Daily         CBG: Recent Labs  Lab 05/17/20 2102 05/18/20 0811 05/18/20 1211 05/18/20 1649 05/18/20 2142  GLUCAP 209* 127* 138* 123* 150*    SpO2: 99 % O2 Flow Rate (L/min): 10 L/min FiO2 (%): 100 %    CBC: Recent Labs  Lab 05/18/20 0254  WBC 12.4*  HGB 11.7*  HCT 35.1*  MCV 90.0  PLT 007    Basic Metabolic Panel: Recent Labs  Lab 05/15/20 0659 05/16/20 0247 05/17/20 0246 05/18/20 0254 05/19/20 0237  NA 132* 130* 131* 132* 137  K 3.2* 3.5 2.8* 3.2* 3.7  CL 87* 85* 87* 91* 97*  CO2 34* 32 32 31 30  GLUCOSE 103* 235* 129* 129* 180*  BUN 29* 34* 33* 27* 22  CREATININE 0.59* 0.82 0.79 0.76 0.83  CALCIUM 8.4* 8.3* 8.4* 8.0* 8.3*  MG  --  2.3  --    --   --   PHOS  --  3.1  --   --   --      Liver Function Tests: Recent Labs  Lab 05/18/20 0254  AST 23  ALT 34  ALKPHOS 63  BILITOT 0.4  PROT 5.8*  ALBUMIN 2.6*     Antibiotics: Anti-infectives (From admission, onward)   Start     Dose/Rate Route Frequency Ordered Stop   05/14/20 1130  valACYclovir (VALTREX) tablet 500 mg  Status:  Discontinued        500 mg Oral Daily 05/14/20 1038 05/14/20 1041   05/14/20 1130  valACYclovir (VALTREX) tablet 1,000 mg        1,000 mg Oral Daily 05/14/20 1041     04/25/20 1000  remdesivir 100 mg in sodium chloride 0.9 % 100 mL IVPB        100 mg 200 mL/hr over 30 Minutes Intravenous Daily 04/24/20 1547 04/28/20 0945   04/25/20 1000  remdesivir 100 mg in sodium chloride 0.9 % 100 mL IVPB  Status:  Discontinued       "Followed by" Linked Group Details   100 mg 200 mL/hr over 30 Minutes Intravenous Daily 04/24/20 1633 04/24/20 1635   04/24/20 1645  remdesivir 200 mg in sodium chloride 0.9% 250 mL IVPB  Status:  Discontinued       "Followed by" Linked Group Details   200 mg 580 mL/hr over 30 Minutes Intravenous Once 04/24/20 1633 04/24/20 1635   04/24/20 1630  remdesivir 200 mg in sodium chloride 0.9% 250 mL IVPB        200 mg 580 mL/hr over 30 Minutes Intravenous Once 04/24/20 1547 04/24/20 1802       DVT prophylaxis: Xarelto  Code Status: Full code  Family Communication: No family at bedside    Status is: Inpatient  Dispo: The patient is from: Home              Anticipated d/c is to: LTAC versus CIR              Anticipated d/c date is: 05/24/2020              Patient currently not medically stable for discharge, requiring high flow oxygen  Barrier to discharge-still requiring high flow oxygen via nasal cannula     Consultants:  Critical care  Procedures:     Objective   Vitals:   05/19/20 0419 05/19/20 0500 05/19/20 0600 05/19/20 0700  BP:    123/75  Pulse:  71 84 77  Resp:  (!)  26 20 (!) 24  Temp:       TempSrc:      SpO2:  100% 98% 99%  Weight: 92.3 kg     Height:        Intake/Output Summary (Last 24 hours) at 05/19/2020 0801 Last data filed at 05/19/2020 0700 Gross per 24 hour  Intake 1185.69 ml  Output 2625 ml  Net -1439.31 ml    10/13 1901 - 10/15 0700 In: 1185.7 [P.O.:1080] Out: 4200 [Urine:4200]  Filed Weights   05/16/20 0500 05/17/20 0625 05/19/20 0419  Weight: 91.9 kg 92.3 kg 92.3 kg    Physical Examination:    General-appears in no acute distress  Heart-S1-S2, regular, no murmur auscultated  Lungs-bibasilar crackles  Abdomen-soft, nontender, no organomegaly  Extremities-no edema in the lower extremities  Neuro-alert, oriented x3, no focal deficit noted   BNP (last 3 results) Recent Labs    05/08/20 0345  BNP 44.5        [place]   [facility] If 7PM-7AM, please contact night-coverage at [email or web address], Office  [phone]   05/19/2020, 8:01 AM  LOS: 25 days

## 2020-05-20 DIAGNOSIS — E1165 Type 2 diabetes mellitus with hyperglycemia: Secondary | ICD-10-CM | POA: Diagnosis not present

## 2020-05-20 DIAGNOSIS — J9601 Acute respiratory failure with hypoxia: Secondary | ICD-10-CM | POA: Diagnosis not present

## 2020-05-20 DIAGNOSIS — U071 COVID-19: Secondary | ICD-10-CM | POA: Diagnosis not present

## 2020-05-20 DIAGNOSIS — J189 Pneumonia, unspecified organism: Secondary | ICD-10-CM | POA: Diagnosis not present

## 2020-05-20 LAB — GLUCOSE, CAPILLARY
Glucose-Capillary: 113 mg/dL — ABNORMAL HIGH (ref 70–99)
Glucose-Capillary: 222 mg/dL — ABNORMAL HIGH (ref 70–99)
Glucose-Capillary: 185 mg/dL — ABNORMAL HIGH (ref 70–99)
Glucose-Capillary: 96 mg/dL (ref 70–99)
Glucose-Capillary: 209 mg/dL — ABNORMAL HIGH (ref 70–99)

## 2020-05-20 LAB — BASIC METABOLIC PANEL WITH GFR
Anion gap: 11 (ref 5–15)
BUN: 24 mg/dL — ABNORMAL HIGH (ref 8–23)
CO2: 36 mmol/L — ABNORMAL HIGH (ref 22–32)
Calcium: 8.5 mg/dL — ABNORMAL LOW (ref 8.9–10.3)
Chloride: 90 mmol/L — ABNORMAL LOW (ref 98–111)
Creatinine, Ser: 0.67 mg/dL (ref 0.61–1.24)
GFR, Estimated: >60 mL/min
Glucose, Bld: 91 mg/dL (ref 70–99)
Potassium: 3 mmol/L — ABNORMAL LOW (ref 3.5–5.1)
Sodium: 137 mmol/L (ref 135–145)

## 2020-05-20 NOTE — Progress Notes (Signed)
[facility]  PROGRESS NOTE  Clayton [name] HUD:[id number] DOB: 08/29/1955 DOA: 04/24/2020 PCP: Kristen Loader, FNP   Brief HPI:   64 year old male with past medical history of asthma, OSA not on CPAP, diabetes mellitus type 2, obesity, depression was admitted to ED on 04/24/2020 with acute hypoxic respiratory failure.  COVID-19 PCR was positive.  Chest x-ray showed bilateral multifocal pneumonia.  Patient completed 5 days of IV remdesivir and 14 days course of oral baricitinib.  Also was treated with high-dose steroids.  Patient continues to have high flow oxygen requirement.    Subjective   Patient seen and examined, complains of coughing.  Patient has been on high flow nasal cannula, also has been getting albuterol and Combivent inhaler.  Complains of throat irritation.  He was given Chloraseptic phenol spray for throat pain as needed.   Assessment/Plan:     1. Acute hypoxemic respiratory failure-secondary to COVID-19 multifocal pneumonia.  Patient completed 5 days of IV remdesivir, 14 days of oral baricitinib.  Patient continues to require oxygen 10 to 15 L/min.  He is currently on Solu-Medrol 40 mg IV every 12 hours and Lasix dose was changed to 40 mg every 12 hours.  He is more than 3 weeks from day of diagnosis so he is currently off isolation.   Chest x-ray on 05/17/2020 showed multifocal pneumonia. CRP is down to 0.8.  WBC is down to 12.4.  Follow BMP in am.   2. Diabetes mellitus type 2-hemoglobin A1c 8.4.  Continue Levemir 22 units subcu twice a day, linagliptin.  Continue premeal NovoLog 8 units subcu 3 times daily.  CBG fairly well controlled. 3. OSA-continue CPAP 4. Acute bilateral lower extremity DVTs-continue Xarelto 5. Depression/anxiety-continue as needed Ativan 6. Chronic diastolic CHF-echo from 8/58/8502 showed EF 60 to 77%, grade 1 diastolic dysfunction.  Continue Lasix as above. 7. Hypokalemia-replete.     COVID-19 Labs  Recent Labs    05/18/20 0254  05/19/20 0237  CRP 0.8 0.6    Lab Results  Component Value Date   SARSCOV2NAA POSITIVE (A) 04/24/2020     Scheduled medications:   . (feeding supplement) PROSource Plus  30 mL Oral BID BM  . vitamin C  500 mg Oral Daily  . benzonatate  100 mg Oral TID  . Chlorhexidine Gluconate Cloth  6 each Topical Daily  . feeding supplement  1 Container Oral Q24H  . furosemide  40 mg Intravenous Q12H  . guaiFENesin  600 mg Oral BID  . insulin aspart  0-20 Units Subcutaneous TID WC  . insulin aspart  0-5 Units Subcutaneous QHS  . insulin aspart  5 Units Subcutaneous TID WC  . insulin detemir  18 Units Subcutaneous BID  . Ipratropium-Albuterol  1 puff Inhalation QID  . linagliptin  5 mg Oral Daily  . losartan  50 mg Oral Daily  . magic mouthwash w/lidocaine  10 mL Oral QID  . mouth rinse  15 mL Mouth Rinse BID  . methylPREDNISolone (SOLU-MEDROL) injection  40 mg Intravenous Q12H  . montelukast  10 mg Oral QHS  . multivitamin with minerals  1 tablet Oral Daily  . nystatin  5 mL Oral QID  . pantoprazole  40 mg Oral Daily  . QUEtiapine  25 mg Oral QHS  . Rivaroxaban  15 mg Oral BID WC   Followed by  . [START ON 05/22/2020] rivaroxaban  20 mg Oral Q supper  . senna-docusate  2 tablet Oral BID  . sertraline  100 mg Oral Daily  .  valACYclovir  1,000 mg Oral Daily  . zinc sulfate  220 mg Oral Daily         CBG: Recent Labs  Lab 05/19/20 0808 05/19/20 1127 05/19/20 1640 05/19/20 2056 05/20/20 0809  GLUCAP 104* 123* 162* 197* 113*    SpO2: 96 % O2 Flow Rate (L/min): 8 L/min (was 97% on 10 LPM _RN aware) FiO2 (%): 100 %    CBC: Recent Labs  Lab 05/18/20 0254  WBC 12.4*  HGB 11.7*  HCT 35.1*  MCV 90.0  PLT 354    Basic Metabolic Panel: Recent Labs  Lab 05/15/20 0659 05/16/20 0247 05/17/20 0246 05/18/20 0254 05/19/20 0237  NA 132* 130* 131* 132* 137  K 3.2* 3.5 2.8* 3.2* 3.7  CL 87* 85* 87* 91* 97*  CO2 34* 32 32 31 30  GLUCOSE 103* 235* 129* 129* 180*  BUN  29* 34* 33* 27* 22  CREATININE 0.59* 0.82 0.79 0.76 0.83  CALCIUM 8.4* 8.3* 8.4* 8.0* 8.3*  MG  --  2.3  --   --   --   PHOS  --  3.1  --   --   --      Liver Function Tests: Recent Labs  Lab 05/18/20 0254  AST 23  ALT 34  ALKPHOS 63  BILITOT 0.4  PROT 5.8*  ALBUMIN 2.6*     Antibiotics: Anti-infectives (From admission, onward)   Start     Dose/Rate Route Frequency Ordered Stop   05/14/20 1130  valACYclovir (VALTREX) tablet 500 mg  Status:  Discontinued        500 mg Oral Daily 05/14/20 1038 05/14/20 1041   05/14/20 1130  valACYclovir (VALTREX) tablet 1,000 mg        1,000 mg Oral Daily 05/14/20 1041     04/25/20 1000  remdesivir 100 mg in sodium chloride 0.9 % 100 mL IVPB        100 mg 200 mL/hr over 30 Minutes Intravenous Daily 04/24/20 1547 04/28/20 0945   04/25/20 1000  remdesivir 100 mg in sodium chloride 0.9 % 100 mL IVPB  Status:  Discontinued       "Followed by" Linked Group Details   100 mg 200 mL/hr over 30 Minutes Intravenous Daily 04/24/20 1633 04/24/20 1635   04/24/20 1645  remdesivir 200 mg in sodium chloride 0.9% 250 mL IVPB  Status:  Discontinued       "Followed by" Linked Group Details   200 mg 580 mL/hr over 30 Minutes Intravenous Once 04/24/20 1633 04/24/20 1635   04/24/20 1630  remdesivir 200 mg in sodium chloride 0.9% 250 mL IVPB        200 mg 580 mL/hr over 30 Minutes Intravenous Once 04/24/20 1547 04/24/20 1802       DVT prophylaxis: Xarelto  Code Status: Full code  Family Communication: No family at bedside    Status is: Inpatient  Dispo: The patient is from: Home              Anticipated d/c is to: LTAC versus CIR              Anticipated d/c date is: 05/24/2020              Patient currently not medically stable for discharge, requiring high flow oxygen  Barrier to discharge-still requiring high flow oxygen via nasal cannula     Consultants:  Critical care  Procedures:     Objective   Vitals:   05/20/20 0500  05/20/20 0600  05/20/20 0700 05/20/20 0835  BP:   110/68 99/62  Pulse: 65 64 96 78  Resp: 18  (!) 29 (!) 22  Temp:      TempSrc:      SpO2: 100% 100% 90% 96%  Weight: 92.3 kg     Height:        Intake/Output Summary (Last 24 hours) at 05/20/2020 0906 Last data filed at 05/20/2020 0700 Gross per 24 hour  Intake 486 ml  Output 2300 ml  Net -1814 ml    10/14 1901 - 10/16 0700 In: 486 [P.O.:486] Out: 4075 [Urine:4075]  Filed Weights   05/17/20 0625 05/19/20 0419 05/20/20 0500  Weight: 92.3 kg 92.3 kg 92.3 kg    Physical Examination:    General-appears in no acute distress  Heart-S1-S2, regular, no murmur auscultated  Lungs-decreased breath sounds bilaterally  Abdomen-soft, nontender, no organomegaly  Extremities-no edema in the lower extremities  Neuro-alert, oriented x3, no focal deficit noted   BNP (last 3 results) Recent Labs    05/08/20 0345  BNP 44.5        [place]   [facility] If 7PM-7AM, please contact night-coverage at [email or web address], Office  [phone]   05/20/2020, 9:06 AM  LOS: 26 days

## 2020-05-21 DIAGNOSIS — U071 COVID-19: Secondary | ICD-10-CM | POA: Diagnosis not present

## 2020-05-21 DIAGNOSIS — J189 Pneumonia, unspecified organism: Secondary | ICD-10-CM | POA: Diagnosis not present

## 2020-05-21 DIAGNOSIS — E1165 Type 2 diabetes mellitus with hyperglycemia: Secondary | ICD-10-CM | POA: Diagnosis not present

## 2020-05-21 DIAGNOSIS — J9601 Acute respiratory failure with hypoxia: Secondary | ICD-10-CM | POA: Diagnosis not present

## 2020-05-21 LAB — BASIC METABOLIC PANEL
Anion gap: 13 (ref 5–15)
BUN: 31 mg/dL — ABNORMAL HIGH (ref 8–23)
CO2: 36 mmol/L — ABNORMAL HIGH (ref 22–32)
Calcium: 8.8 mg/dL — ABNORMAL LOW (ref 8.9–10.3)
Chloride: 88 mmol/L — ABNORMAL LOW (ref 98–111)
Creatinine, Ser: 0.68 mg/dL (ref 0.61–1.24)
GFR, Estimated: >60 mL/min (ref >60)
Glucose, Bld: 114 mg/dL — ABNORMAL HIGH (ref 70–99)
Potassium: 2.9 mmol/L — ABNORMAL LOW (ref 3.5–5.1)
Sodium: 137 mmol/L (ref 135–145)

## 2020-05-21 LAB — GLUCOSE, CAPILLARY
Glucose-Capillary: 135 mg/dL — ABNORMAL HIGH (ref 70–99)
Glucose-Capillary: 188 mg/dL — ABNORMAL HIGH (ref 70–99)
Glucose-Capillary: 129 mg/dL — ABNORMAL HIGH (ref 70–99)
Glucose-Capillary: 160 mg/dL — ABNORMAL HIGH (ref 70–99)

## 2020-05-21 MED ORDER — GUAIFENESIN ER 600 MG PO TB12
1200.0000 mg | ORAL_TABLET | Freq: Two times a day (BID) | ORAL | Status: DC
  Administered 2020-05-21 – 2020-05-29 (×16): 1200 mg via ORAL
  Filled 2020-05-21 (×16): qty 2

## 2020-05-21 MED ORDER — POTASSIUM CHLORIDE 10 MEQ/100ML IV SOLN
10.0000 meq | INTRAVENOUS | Status: AC
  Administered 2020-05-21 (×5): 10 meq via INTRAVENOUS
  Filled 2020-05-21 (×5): qty 100

## 2020-05-21 MED ORDER — BENZONATATE 100 MG PO CAPS
100.0000 mg | ORAL_CAPSULE | Freq: Three times a day (TID) | ORAL | Status: DC | PRN
  Administered 2020-05-21 – 2020-05-26 (×6): 100 mg via ORAL
  Filled 2020-05-21 (×6): qty 1

## 2020-05-21 NOTE — Progress Notes (Signed)
[facility]  PROGRESS NOTE  Clayton [name] DUK:[id number] DOB: Dec 12, 1955 DOA: 04/24/2020 PCP: Kristen Loader, FNP   Brief HPI:   64 year old male with past medical history of asthma, OSA not on CPAP, diabetes mellitus type 2, obesity, depression was admitted to ED on 04/24/2020 with acute hypoxic respiratory failure.  COVID-19 PCR was positive.  Chest x-ray showed bilateral multifocal pneumonia.  Patient completed 5 days of IV remdesivir and 14 days course of oral baricitinib.  Also was treated with high-dose steroids.  Patient continues to have high flow oxygen requirement.    Subjective   Patient seen and examined, coughing up more phlegm.  Denies shortness of breath.  Still requiring 10 L/min of oxygen HFNC.    Assessment/Plan:     1. Acute hypoxemic respiratory failure-secondary to COVID-19 multifocal pneumonia.  Patient completed 5 days of IV remdesivir, 14 days of oral baricitinib.  Patient continues to require oxygen 10 to 15 L/min.  He is currently on Solu-Medrol 40 mg IV every 12 hours and Lasix dose was changed to 40 mg every 12 hours.  He is more than 3 weeks from day of diagnosis so he is currently off isolation.   Chest x-ray on 05/17/2020 showed multifocal pneumonia. CRP is down to 0.8.  WBC is down to 12.4.  Follow BMP in am.  Continue Mucinex, Tessalon Perles 100 mg 3 times daily as needed. 2. Hypokalemia-potassium was 2.9 today.  Will replace potassium with IV KCl 10 mEq x 5.  Follow BMP in am. 3. Diabetes mellitus type 2-hemoglobin A1c 8.4.  Continue Levemir 22 units subcu twice a day, linagliptin.  Continue premeal NovoLog 8 units subcu 3 times daily.  CBG fairly well controlled. 4. OSA-continue CPAP 5. Acute bilateral lower extremity DVTs-continue Xarelto 6. Depression/anxiety-continue as needed Ativan 7. Chronic diastolic CHF-echo from 3/76/2831 showed EF 60 to 51%, grade 1 diastolic dysfunction.  Continue Lasix as above.      COVID-19 Labs  Recent Labs     05/19/20 0237  CRP 0.6    Lab Results  Component Value Date   SARSCOV2NAA POSITIVE (A) 04/24/2020     Scheduled medications:   . (feeding supplement) PROSource Plus  30 mL Oral BID BM  . vitamin C  500 mg Oral Daily  . Chlorhexidine Gluconate Cloth  6 each Topical Daily  . feeding supplement  1 Container Oral Q24H  . furosemide  40 mg Intravenous Q12H  . guaiFENesin  1,200 mg Oral BID  . insulin aspart  0-20 Units Subcutaneous TID WC  . insulin aspart  0-5 Units Subcutaneous QHS  . insulin aspart  5 Units Subcutaneous TID WC  . insulin detemir  18 Units Subcutaneous BID  . Ipratropium-Albuterol  1 puff Inhalation QID  . linagliptin  5 mg Oral Daily  . losartan  50 mg Oral Daily  . magic mouthwash w/lidocaine  10 mL Oral QID  . mouth rinse  15 mL Mouth Rinse BID  . methylPREDNISolone (SOLU-MEDROL) injection  40 mg Intravenous Q12H  . montelukast  10 mg Oral QHS  . multivitamin with minerals  1 tablet Oral Daily  . nystatin  5 mL Oral QID  . pantoprazole  40 mg Oral Daily  . QUEtiapine  25 mg Oral QHS  . Rivaroxaban  15 mg Oral BID WC   Followed by  . [START ON 05/22/2020] rivaroxaban  20 mg Oral Q supper  . senna-docusate  2 tablet Oral BID  . sertraline  100 mg Oral Daily  .  valACYclovir  1,000 mg Oral Daily  . zinc sulfate  220 mg Oral Daily         CBG: Recent Labs  Lab 05/20/20 1341 05/20/20 1642 05/20/20 2241 05/21/20 0722 05/21/20 1111  GLUCAP 185* 96 209* 135* 188*    SpO2: 92 % O2 Flow Rate (L/min): 10 L/min FiO2 (%): 100 %    CBC: Recent Labs  Lab 05/18/20 0254  WBC 12.4*  HGB 11.7*  HCT 35.1*  MCV 90.0  PLT 656    Basic Metabolic Panel: Recent Labs  Lab 05/16/20 0247 05/16/20 0247 05/17/20 0246 05/18/20 0254 05/19/20 0237 05/20/20 0836 05/21/20 0459  NA 130*   < > 131* 132* 137 137 137  K 3.5   < > 2.8* 3.2* 3.7 3.0* 2.9*  CL 85*   < > 87* 91* 97* 90* 88*  CO2 32   < > 32 31 30 36* 36*  GLUCOSE 235*   < > 129* 129* 180*  91 114*  BUN 34*   < > 33* 27* 22 24* 31*  CREATININE 0.82   < > 0.79 0.76 0.83 0.67 0.68  CALCIUM 8.3*   < > 8.4* 8.0* 8.3* 8.5* 8.8*  MG 2.3  --   --   --   --   --   --   PHOS 3.1  --   --   --   --   --   --    < > = values in this interval not displayed.     Liver Function Tests: Recent Labs  Lab 05/18/20 0254  AST 23  ALT 34  ALKPHOS 63  BILITOT 0.4  PROT 5.8*  ALBUMIN 2.6*     Antibiotics: Anti-infectives (From admission, onward)   Start     Dose/Rate Route Frequency Ordered Stop   05/14/20 1130  valACYclovir (VALTREX) tablet 500 mg  Status:  Discontinued        500 mg Oral Daily 05/14/20 1038 05/14/20 1041   05/14/20 1130  valACYclovir (VALTREX) tablet 1,000 mg        1,000 mg Oral Daily 05/14/20 1041     04/25/20 1000  remdesivir 100 mg in sodium chloride 0.9 % 100 mL IVPB        100 mg 200 mL/hr over 30 Minutes Intravenous Daily 04/24/20 1547 04/28/20 0945   04/25/20 1000  remdesivir 100 mg in sodium chloride 0.9 % 100 mL IVPB  Status:  Discontinued       "Followed by" Linked Group Details   100 mg 200 mL/hr over 30 Minutes Intravenous Daily 04/24/20 1633 04/24/20 1635   04/24/20 1645  remdesivir 200 mg in sodium chloride 0.9% 250 mL IVPB  Status:  Discontinued       "Followed by" Linked Group Details   200 mg 580 mL/hr over 30 Minutes Intravenous Once 04/24/20 1633 04/24/20 1635   04/24/20 1630  remdesivir 200 mg in sodium chloride 0.9% 250 mL IVPB        200 mg 580 mL/hr over 30 Minutes Intravenous Once 04/24/20 1547 04/24/20 1802       DVT prophylaxis: Xarelto  Code Status: Full code  Family Communication: No family at bedside    Status is: Inpatient  Dispo: The patient is from: Home              Anticipated d/c is to: LTAC versus CIR              Anticipated d/c date is: 05/24/2020  Patient currently not medically stable for discharge, requiring high flow oxygen  Barrier to discharge-still requiring high flow oxygen via nasal  cannula     Consultants:  Critical care  Procedures:     Objective   Vitals:   05/20/20 2226 05/21/20 0212 05/21/20 0552 05/21/20 1234  BP: 97/71 100/72 102/68 104/65  Pulse: 97 87 85 (!) 103  Resp: 19  19 20   Temp: 98.5 F (36.9 C) 98 F (36.7 C) 98.2 F (36.8 C) 98.2 F (36.8 C)  TempSrc: Oral Oral Oral Oral  SpO2: 93% 96% 95% 92%  Weight:      Height:        Intake/Output Summary (Last 24 hours) at 05/21/2020 1555 Last data filed at 05/21/2020 1500 Gross per 24 hour  Intake --  Output 3900 ml  Net -3900 ml    10/15 1901 - 10/17 0700 In: -  Out: 4500 [Urine:4500]  Filed Weights   05/17/20 0625 05/19/20 0419 05/20/20 0500  Weight: 92.3 kg 92.3 kg 92.3 kg    Physical Examination:  General-appears in no acute distress Heart-S1-S2, regular, no murmur auscultated Lungs-clear to auscultation bilaterally, no wheezing or crackles auscultated Abdomen-soft, nontender, no organomegaly Extremities-no edema in the lower extremities Neuro-alert, oriented x3, no focal deficit noted  BNP (last 3 results) Recent Labs    05/08/20 0345  BNP 44.5        [place]   [facility] If 7PM-7AM, please contact night-coverage at [email or web address], Office  [phone]   05/21/2020, 3:55 PM  LOS: 27 days

## 2020-05-21 NOTE — Progress Notes (Signed)
Pt refused to get up and ambulate or sit in the recliner. Have approached several times to encourage ambulation and movement. Pt has intermittent cough and request PRN several times. Will cont to monitor. SRP, RN

## 2020-05-22 ENCOUNTER — Inpatient Hospital Stay (HOSPITAL_COMMUNITY): Payer: BC Managed Care – PPO

## 2020-05-22 DIAGNOSIS — E1165 Type 2 diabetes mellitus with hyperglycemia: Secondary | ICD-10-CM | POA: Diagnosis not present

## 2020-05-22 DIAGNOSIS — J189 Pneumonia, unspecified organism: Secondary | ICD-10-CM | POA: Diagnosis not present

## 2020-05-22 DIAGNOSIS — U071 COVID-19: Secondary | ICD-10-CM | POA: Diagnosis not present

## 2020-05-22 DIAGNOSIS — J9601 Acute respiratory failure with hypoxia: Secondary | ICD-10-CM | POA: Diagnosis not present

## 2020-05-22 LAB — BASIC METABOLIC PANEL
Anion gap: 10 (ref 5–15)
BUN: 23 mg/dL (ref 8–23)
CO2: 35 mmol/L — ABNORMAL HIGH (ref 22–32)
Calcium: 8 mg/dL — ABNORMAL LOW (ref 8.9–10.3)
Chloride: 89 mmol/L — ABNORMAL LOW (ref 98–111)
Creatinine, Ser: 0.67 mg/dL (ref 0.61–1.24)
GFR, Estimated: >60 mL/min (ref >60)
Glucose, Bld: 130 mg/dL — ABNORMAL HIGH (ref 70–99)
Potassium: 2.8 mmol/L — ABNORMAL LOW (ref 3.5–5.1)
Sodium: 134 mmol/L — ABNORMAL LOW (ref 135–145)

## 2020-05-22 LAB — GLUCOSE, CAPILLARY
Glucose-Capillary: 124 mg/dL — ABNORMAL HIGH (ref 70–99)
Glucose-Capillary: 181 mg/dL — ABNORMAL HIGH (ref 70–99)
Glucose-Capillary: 100 mg/dL — ABNORMAL HIGH (ref 70–99)
Glucose-Capillary: 198 mg/dL — ABNORMAL HIGH (ref 70–99)

## 2020-05-22 LAB — MAGNESIUM: Magnesium: 2.2 mg/dL (ref 1.7–2.4)

## 2020-05-22 MED ORDER — LORAZEPAM 2 MG/ML IJ SOLN: 0.5000 mg | INTRAMUSCULAR | Status: DC | PRN

## 2020-05-22 MED ORDER — ALPRAZOLAM 1 MG PO TABS: 1.0000 mg | ORAL_TABLET | Freq: Three times a day (TID) | ORAL | Status: DC | PRN

## 2020-05-22 NOTE — Progress Notes (Signed)
Sp02 on 10 LPM [facility] and 15 LPm PRB= 97%. PT has agreed to remove PRB to get Sp02 reading off. RN aware.

## 2020-05-22 NOTE — Progress Notes (Signed)
[facility]  PROGRESS NOTE  Clayton [name] TIR:[id number] DOB: 11-07-1955 DOA: 04/24/2020 PCP: Kristen Loader, FNP   Brief HPI:   64 year old male with past medical history of asthma, OSA not on CPAP, diabetes mellitus type 2, obesity, depression was admitted to ED on 04/24/2020 with acute hypoxic respiratory failure.  COVID-19 PCR was positive.  Chest x-ray showed bilateral multifocal pneumonia.  Patient completed 5 days of IV remdesivir and 14 days course of oral baricitinib.  Also was treated with high-dose steroids.  Patient continues to have high flow oxygen requirement.    Subjective   Patient seen and examined, continues to require 10 L/min HFNC.  Denies worsening shortness of breath.   Assessment/Plan:     1. Acute hypoxemic respiratory failure-secondary to COVID-19 multifocal pneumonia.  Patient completed 5 days of IV remdesivir, 14 days of oral baricitinib.  Patient continues to require oxygen 10 to 15 L/min.  He is currently on Solu-Medrol 40 mg IV every 12 hours and Lasix dose was changed to 40 mg every 12 hours.  He is more than 3 weeks from day of diagnosis so he is currently off isolation.   Chest x-ray on 05/17/2020 showed multifocal pneumonia. CRP is down to 0.8.  WBC is down to 12.4.  Repeat chest x-ray again shows multifocal pneumonia.  Patient has diuresed -17 L on IV Lasix.  We will continue with diuresis still renal function starts to get worsen.  Continue Tessalon Perles as needed, Mucinex as needed. 2. Hypokalemia-potassium is 2.8 today. Will replace potassium and follow BMP in am.  3. Diabetes mellitus type 2-hemoglobin A1c 8.4.  Continue Levemir 18 units subcu twice a day, linagliptin.  Continue premeal NovoLog 8 units subcu 3 times daily.  CBG fairly well controlled. 4. OSA-continue CPAP 5. Acute bilateral lower extremity DVTs-continue Xarelto 6. Depression/anxiety-continue as needed Ativan 7. Chronic diastolic CHF-echo from 6/76/1950 showed EF 60 to 70%, grade  1 diastolic dysfunction.  Continue Lasix as above.      COVID-19 Labs  No results for input(s): DDIMER, FERRITIN, LDH, CRP in the last 72 hours.  Lab Results  Component Value Date   SARSCOV2NAA POSITIVE (A) 04/24/2020     Scheduled medications:   . (feeding supplement) PROSource Plus  30 mL Oral BID BM  . vitamin C  500 mg Oral Daily  . feeding supplement  1 Container Oral Q24H  . furosemide  40 mg Intravenous Q12H  . guaiFENesin  1,200 mg Oral BID  . insulin aspart  0-20 Units Subcutaneous TID WC  . insulin aspart  0-5 Units Subcutaneous QHS  . insulin aspart  5 Units Subcutaneous TID WC  . insulin detemir  18 Units Subcutaneous BID  . Ipratropium-Albuterol  1 puff Inhalation QID  . linagliptin  5 mg Oral Daily  . losartan  50 mg Oral Daily  . magic mouthwash w/lidocaine  10 mL Oral QID  . mouth rinse  15 mL Mouth Rinse BID  . methylPREDNISolone (SOLU-MEDROL) injection  40 mg Intravenous Q12H  . montelukast  10 mg Oral QHS  . multivitamin with minerals  1 tablet Oral Daily  . nystatin  5 mL Oral QID  . pantoprazole  40 mg Oral Daily  . QUEtiapine  25 mg Oral QHS  . rivaroxaban  20 mg Oral Q supper  . senna-docusate  2 tablet Oral BID  . sertraline  100 mg Oral Daily  . valACYclovir  1,000 mg Oral Daily  . zinc sulfate  220 mg Oral  Daily         CBG: Recent Labs  Lab 05/21/20 1111 05/21/20 1617 05/21/20 2102 05/22/20 0749 05/22/20 1056  GLUCAP 188* 129* 160* 124* 181*    SpO2: 91 % O2 Flow Rate (L/min): 10 L/min FiO2 (%): 100 %    CBC: Recent Labs  Lab 05/18/20 0254  WBC 12.4*  HGB 11.7*  HCT 35.1*  MCV 90.0  PLT 161    Basic Metabolic Panel: Recent Labs  Lab 05/16/20 0247 05/17/20 0246 05/18/20 0254 05/19/20 0237 05/20/20 0836 05/21/20 0459 05/22/20 0431  NA 130*   < > 132* 137 137 137 134*  K 3.5   < > 3.2* 3.7 3.0* 2.9* 2.8*  CL 85*   < > 91* 97* 90* 88* 89*  CO2 32   < > 31 30 36* 36* 35*  GLUCOSE 235*   < > 129* 180* 91  114* 130*  BUN 34*   < > 27* 22 24* 31* 23  CREATININE 0.82   < > 0.76 0.83 0.67 0.68 0.67  CALCIUM 8.3*   < > 8.0* 8.3* 8.5* 8.8* 8.0*  MG 2.3  --   --   --   --   --  2.2  PHOS 3.1  --   --   --   --   --   --    < > = values in this interval not displayed.     Liver Function Tests: Recent Labs  Lab 05/18/20 0254  AST 23  ALT 34  ALKPHOS 63  BILITOT 0.4  PROT 5.8*  ALBUMIN 2.6*     Antibiotics: Anti-infectives (From admission, onward)   Start     Dose/Rate Route Frequency Ordered Stop   05/14/20 1130  valACYclovir (VALTREX) tablet 500 mg  Status:  Discontinued        500 mg Oral Daily 05/14/20 1038 05/14/20 1041   05/14/20 1130  valACYclovir (VALTREX) tablet 1,000 mg        1,000 mg Oral Daily 05/14/20 1041     04/25/20 1000  remdesivir 100 mg in sodium chloride 0.9 % 100 mL IVPB        100 mg 200 mL/hr over 30 Minutes Intravenous Daily 04/24/20 1547 04/28/20 0945   04/25/20 1000  remdesivir 100 mg in sodium chloride 0.9 % 100 mL IVPB  Status:  Discontinued       "Followed by" Linked Group Details   100 mg 200 mL/hr over 30 Minutes Intravenous Daily 04/24/20 1633 04/24/20 1635   04/24/20 1645  remdesivir 200 mg in sodium chloride 0.9% 250 mL IVPB  Status:  Discontinued       "Followed by" Linked Group Details   200 mg 580 mL/hr over 30 Minutes Intravenous Once 04/24/20 1633 04/24/20 1635   04/24/20 1630  remdesivir 200 mg in sodium chloride 0.9% 250 mL IVPB        200 mg 580 mL/hr over 30 Minutes Intravenous Once 04/24/20 1547 04/24/20 1802       DVT prophylaxis: Xarelto  Code Status: Full code  Family Communication: No family at bedside    Status is: Inpatient  Dispo: The patient is from: Home              Anticipated d/c is to: LTAC versus CIR              Anticipated d/c date is: 05/24/2020              Patient currently not  medically stable for discharge, requiring high flow oxygen  Barrier to discharge-still requiring high flow oxygen via nasal  cannula     Consultants:  Critical care  Procedures:     Objective   Vitals:   05/22/20 0819 05/22/20 1058 05/22/20 1205 05/22/20 1231  BP:   109/65   Pulse:   (!) 107   Resp:   20   Temp:   98.5 F (36.9 C)   TempSrc:   Oral   SpO2: 98% (!) 84% (!) 88% 91%  Weight:      Height:        Intake/Output Summary (Last 24 hours) at 05/22/2020 1446 Last data filed at 05/22/2020 1300 Gross per 24 hour  Intake 961.11 ml  Output 3725 ml  Net -2763.89 ml    10/16 1901 - 10/18 0700 In: 361.1  Out: 4225 [Urine:4225]  Filed Weights   05/19/20 0419 05/20/20 0500 05/22/20 0500  Weight: 92.3 kg 92.3 kg 92.2 kg    Physical Examination:  General-appears in no acute distress Heart-S1-S2, regular, no murmur auscultated Lungs-clear to auscultation bilaterally, no wheezing or crackles auscultated Abdomen-soft, nontender, no organomegaly Extremities-no edema in the lower extremities Neuro-alert, oriented x3, no focal deficit noted  BNP (last 3 results) Recent Labs    05/08/20 0345  BNP 44.5        [place]   [facility] If 7PM-7AM, please contact night-coverage at [email or web address], Office  [phone]   05/22/2020, 2:46 PM  LOS: 28 days

## 2020-05-22 NOTE — Progress Notes (Signed)
Pt up to chair for about 2 hours, refused PT, and continues to have intermittent cough. Pt encouraged to use BSC but declines. Will continue to monitor. Hulda Marin, RN

## 2020-05-22 NOTE — Progress Notes (Signed)
PT Cancellation Note  Patient Details Name: Clayton [name] MRN: [id number] DOB: Aug 25, 1955   Cancelled Treatment:    Reason Eval/Treat Not Completed: patient reports Fatigue/ limiting ability to participate, was up in recliner x 2-3 hours. Will check back tomorrow.   Claretha Cooper 05/22/2020, 3:35 PM [place] Pager [phone] Office [phone]

## 2020-05-23 DIAGNOSIS — E1165 Type 2 diabetes mellitus with hyperglycemia: Secondary | ICD-10-CM | POA: Diagnosis not present

## 2020-05-23 DIAGNOSIS — U071 COVID-19: Secondary | ICD-10-CM | POA: Diagnosis not present

## 2020-05-23 DIAGNOSIS — J9601 Acute respiratory failure with hypoxia: Secondary | ICD-10-CM | POA: Diagnosis not present

## 2020-05-23 DIAGNOSIS — J189 Pneumonia, unspecified organism: Secondary | ICD-10-CM | POA: Diagnosis not present

## 2020-05-23 LAB — BASIC METABOLIC PANEL
Anion gap: 12 (ref 5–15)
BUN: 23 mg/dL (ref 8–23)
CO2: 35 mmol/L — ABNORMAL HIGH (ref 22–32)
Calcium: 8.6 mg/dL — ABNORMAL LOW (ref 8.9–10.3)
Chloride: 88 mmol/L — ABNORMAL LOW (ref 98–111)
Creatinine, Ser: 0.69 mg/dL (ref 0.61–1.24)
GFR, Estimated: >60 mL/min (ref >60)
Glucose, Bld: 130 mg/dL — ABNORMAL HIGH (ref 70–99)
Potassium: 2.7 mmol/L — CL (ref 3.5–5.1)
Sodium: 135 mmol/L (ref 135–145)

## 2020-05-23 LAB — GLUCOSE, CAPILLARY
Glucose-Capillary: 123 mg/dL — ABNORMAL HIGH (ref 70–99)
Glucose-Capillary: 234 mg/dL — ABNORMAL HIGH (ref 70–99)
Glucose-Capillary: 75 mg/dL (ref 70–99)
Glucose-Capillary: 268 mg/dL — ABNORMAL HIGH (ref 70–99)

## 2020-05-23 MED ORDER — PROSOURCE PLUS PO LIQD
30.0000 mL | Freq: Every day | ORAL | Status: DC
  Filled 2020-05-23 (×2): qty 30

## 2020-05-23 MED ORDER — IPRATROPIUM-ALBUTEROL 20-100 MCG/ACT IN AERS
1.0000 | INHALATION_SPRAY | Freq: Three times a day (TID) | RESPIRATORY_TRACT | Status: DC
  Administered 2020-05-23 – 2020-05-30 (×20): 1 via RESPIRATORY_TRACT
  Filled 2020-05-23: qty 4

## 2020-05-23 MED ORDER — POTASSIUM CHLORIDE 20 MEQ PO PACK
40.0000 meq | PACK | Freq: Two times a day (BID) | ORAL | Status: DC
  Administered 2020-05-23: 40 meq via ORAL
  Filled 2020-05-23: qty 2

## 2020-05-23 MED ORDER — POTASSIUM CHLORIDE 10 MEQ/100ML IV SOLN
10.0000 meq | INTRAVENOUS | Status: AC
  Administered 2020-05-23 (×4): 10 meq via INTRAVENOUS
  Filled 2020-05-23 (×4): qty 100

## 2020-05-23 MED ORDER — BOOST / RESOURCE BREEZE PO LIQD CUSTOM
1.0000 | Freq: Two times a day (BID) | ORAL | Status: DC
  Administered 2020-05-24 – 2020-05-29 (×6): 1 via ORAL

## 2020-05-23 NOTE — Progress Notes (Signed)
PT is currently on 8 LPM (approximately 55% Fi02) [facility] high flow nasal cannula oxygen system. Approximate Fi02 will vary depending on PT pattern of breathing and available humidity. Approximate Fi02 was verified with [employer] with [employer] on 05/23/2020.

## 2020-05-23 NOTE — Progress Notes (Signed)
CRITICAL VALUE ALERT  Critical Value:  K+ 2.7  Date & Time Notied:  05/23/2020 0524  Provider Notified: M. Sharlet [place]   Orders Received/Actions taken: 4 runs of K+ IVPB

## 2020-05-23 NOTE — Progress Notes (Signed)
Physical Therapy Treatment Patient Details Name: Clayton [name] MRN: [id number] DOB: 1955/09/19 Today's Date: 05/23/2020    History of Present Illness 64 yo male admitted 04/24/20 with COVID, bil DVT. Hx of asthma, obesity, DM.    PT Comments    The patient earlier had declined therapies due to IV's, "ned to rest". Patient agreed for 11:30 session and was compliant. At rest on 8 L , SPO2 95%. Placed on 10 L  for ambulation x 12'. . SPO2 dropped to 78% with gradual return to >90%. Patient then self propelled WC x 120', SPO2 remaining > 90% on 10 L. Decreased back to 8 L with slight drop to 88% and then gradual return to 95%. HR 96 to 128'.  The patient is slowly improving, working towards decreased need for O2 down to 6 L so he will be a candidate to go to CIR.  Follow Up Recommendations  LTACH;CIR     Equipment Recommendations  None recommended by PT    Recommendations for Other Services       Precautions / Restrictions Precautions Precautions: Fall Precaution Comments: desats, requires nmuch encouragement to slow down breathing, to not use partial NRB for recovery    Mobility  Bed Mobility   Bed Mobility: Supine to Sit     Supine to sit: Supervision     General bed mobility comments: cues  for  PLB, slow RR,  Transfers Overall transfer level: Needs assistance Equipment used: Rolling walker (2 wheeled) Transfers: Sit to/from Stand Sit to Stand: Min guard Stand pivot transfers: Min guard       General transfer comment: patient stands quickly when he is ready.  Transferred from [facility] to recliner, stood and took small steps.  Ambulation/Gait Ambulation/Gait assistance: Min assist;+2 safety/equipment Gait Distance (Feet): 12 Feet Assistive device: Rolling walker (2 wheeled) Gait Pattern/deviations: Step-to pattern;Decreased step length - right Gait velocity: decr   General Gait Details: placed on 10 L HFNC for activity, frequent cues to slow RR, Able to ambulate witn  min guard. After distance reached , did stand  for ~ 1 minute  before asking to sit down.   Stairs             Information systems manager mobility: Yes Wheelchair propulsion: Both upper extremities;Both lower extermities Wheelchair parts: Supervision/cueing Distance: 120 Wheelchair Assistance Details (indicate cue type and reason): cues for safety, locking breaks prior to standing  Modified Rankin (Stroke Patients Only)       Balance Overall balance assessment: Needs assistance Sitting-balance support: Bilateral upper extremity supported Sitting balance-Leahy Scale: Good     Standing balance support: Bilateral upper extremity supported;During functional activity Standing balance-Leahy Scale: Fair Standing balance comment: reliant on walker                            Cognition Arousal/Alertness: Awake/alert Behavior During Therapy: Anxious Overall Cognitive Status: Within Functional Limits for tasks assessed                                 General Comments: less anxious,      Exercises      General Comments        Pertinent Vitals/Pain Pain Score: 0-No pain    Home Living                      Prior Function  PT Goals (current goals can now be found in the care plan section) Acute Rehab PT Goals Patient Stated Goal: to go home PT Goal Formulation: With patient Time For Goal Achievement: 06/06/20 Potential to Achieve Goals: Good Progress towards PT goals: Progressing toward goals    Frequency    Min 3X/week      PT Plan Current plan remains appropriate    Co-evaluation PT/OT/SLP Co-Evaluation/Treatment: Yes Reason for Co-Treatment: Complexity of the patient's impairments (multi-system involvement);For patient/therapist safety;Necessary to address cognition/behavior during functional activity PT goals addressed during session: Mobility/safety with mobility         AM-PAC PT "6 Clicks" Mobility   Outcome Measure  Help needed turning from your back to your side while in a flat bed without using bedrails?: A Little Help needed moving from lying on your back to sitting on the side of a flat bed without using bedrails?: A Little Help needed moving to and from a bed to a chair (including a wheelchair)?: A Little Help needed standing up from a chair using your arms (e.g., wheelchair or bedside chair)?: A Little Help needed to walk in hospital room?: A Lot Help needed climbing 3-5 steps with a railing? : Total 6 Click Score: 15    End of Session Equipment Utilized During Treatment: Oxygen;Gait belt Activity Tolerance: Patient tolerated treatment well Patient left: with call bell/phone within reach;in chair;with chair alarm set Nurse Communication: Mobility status PT Visit Diagnosis: Difficulty in walking, not elsewhere classified (R26.2)     Time: 5427-0623 PT Time Calculation (min) (ACUTE ONLY): 45 min  Charges:  $Gait Training: 8-22 mins $Wheel Chair Management: 8-22 mins                     [place] Pager [phone] Office [phone]    Claretha Cooper 05/23/2020, 2:16 PM

## 2020-05-23 NOTE — TOC Progression Note (Signed)
Transition of Care [facility]) - Progression Note    Patient Details  Name: Clayton [name] MRN: [id number] Date of Birth: 21-Apr-1956  Transition of Care [facility] CM/SW Contact  Ross Ludwig, [place] Phone Number: 05/23/2020, 4:14 PM  Clinical Narrative:     CSW spoke to patient to discuss if he is still interested in going to [facility].  Patient said, he would like the MD to speak to him more about, CSW notified physician to speak to him to see if he is still a good candidate.   Expected Discharge Plan: Home/Self Care Barriers to Discharge: Continued Medical Work up  Expected Discharge Plan and Services Expected Discharge Plan: Home/Self Care   Discharge Planning Services: CM Consult   Living arrangements for the past 2 months: Single Family Home                                       Social Determinants of Health (SDOH) Interventions    Readmission Risk Interventions No flowsheet data found.

## 2020-05-23 NOTE — Progress Notes (Signed)
Occupational Therapy Progress Note  Attempt relaxation techniques with patient this session, visualizing on beach fishing during seated recovery from ambulation to encourage self regulation vs dependence on NRB. Patient able to recover 1/2 trials without NRB to 89-90% on 10L. Patient able to ambulate with rolling walker and min G assist for approx 68ft before requiring seated rest break. Patient does require encouragement at end of session to transfer to chair, min G without AD for stand pivot from wheelchair to recliner.     05/23/20 1413  OT Visit Information  Last OT Received On 05/23/20  Assistance Needed +2 (to ambulate)  PT/OT/SLP Co-Evaluation/Treatment Yes  Reason for Co-Treatment For patient/therapist safety;To address functional/ADL transfers  OT goals addressed during session ADL's and self-care  History of Present Illness 64 yo male admitted with COVID, bil DVT. Hx of asthma, obesity, DM.  Precautions  Precautions Fall  Precaution Comments desats, requires nmuch encouragement to slow down breathing, to not use partial NRB for recovery  Pain Assessment  Pain Assessment Faces  Faces Pain Scale 0  Cognition  Arousal/Alertness Awake/alert  Behavior During Therapy Anxious  Overall Cognitive Status Within Functional Limits for tasks assessed  General Comments tried implementing relaxation techniques, imagining at beach fishing to reduce anxiety and decrease dependence on non rebreather mask  ADL  Overall ADL's  Needs assistance/impaired  Lower Body Dressing Supervision/safety;Sitting/lateral leans  Lower Body Dressing Details (indicate cue type and reason) witht increased time patient able to don socks with figure 4 method, requiring rest break after each sock donned   Bed Mobility  Overal bed mobility Needs Assistance  Bed Mobility Supine to Sit  Supine to sit Supervision  General bed mobility comments verbal cues for pursed lip breathing and try to slow RR  Balance  Overall  balance assessment Needs assistance  Sitting-balance support Feet supported  Sitting balance-Leahy Scale Good  Sitting balance - Comments able to don socks seated EOB  Standing balance support Bilateral upper extremity supported;During functional activity  Standing balance-Leahy Scale Fair  Standing balance comment able to stand pivot to recliner without UE support, utilizes walker for ambulation  Transfers  Overall transfer level Needs assistance  Equipment used Rolling walker (2 wheeled);None  Transfers Sit to/from Stand  Sit to [employer] guard  Stand pivot transfers Min guard  General transfer comment patient stands very quickly when feels ready to mobilize, transfers from w/c to recliner without use of rolling walker   General Comments  General comments (skin integrity, edema, etc.) patient increased to 10L for ambulation/mobility with desaturation to 75% and max cues for relaxation, patient able to recover to 89-90% without NRB seated in recliner after ~2 mins. able to wean back to 8L at end of session.   OT - End of Session  Equipment Utilized During Treatment Oxygen;Rolling walker  Activity Tolerance Patient tolerated treatment well;Other (comment) (less anxious )  Patient left in chair;with call bell/phone within reach;with chair alarm set  Nurse Communication Mobility status;Other (comment) (anxiety)  OT Assessment/Plan  OT Plan Discharge plan remains appropriate  OT Visit Diagnosis Other abnormalities of gait and mobility (R26.89);Muscle weakness (generalized) (M62.81)  OT Frequency (ACUTE ONLY) Min 2X/week  Follow Up Recommendations LTACH  OT Equipment Tub/shower seat  AM-PAC OT "6 Clicks" Daily Activity Outcome Measure (Version 2)  Help from another person eating meals? 4  Help from another person taking care of personal grooming? 3  Help from another person toileting, which includes using toliet, bedpan, or urinal? 2  Help  from another person bathing (including washing,  rinsing, drying)? 2  Help from another person to put on and taking off regular upper body clothing? 3  Help from another person to put on and taking off regular lower body clothing? 3  6 Click Score 17  OT Goal Progression  Progress towards OT goals Progressing toward goals  Acute Rehab OT Goals  Patient Stated Goal to go home  OT Goal Formulation With patient  Time For Goal Achievement 06/06/20  Potential to Achieve Goals Fair  ADL Goals  Pt Will Perform Grooming with supervision;sitting;standing  Pt Will Perform Upper Body Dressing with supervision;sitting;standing  Pt Will Perform Lower Body Dressing with supervision;sitting/lateral leans;sit to/from stand  Pt Will Transfer to Toilet with supervision;ambulating;regular height toilet (walker)  Pt Will Perform Toileting - Clothing Manipulation and hygiene with supervision;sitting/lateral leans;sit to/from stand  Additional ADL Goal #1 Patient will demonstrate pursed lip breathing techniques in 2/3 trials of functional activity with zero verbal cues.  OT Time Calculation  OT Start Time (ACUTE ONLY) 1130  OT Stop Time (ACUTE ONLY) 1200  OT Time Calculation (min) 30 min  OT General Charges  $OT Visit 1 Visit  OT Treatments  $Self Care/Home Management  8-22 mins   Delbert Phenix OT OT pager: [phone]

## 2020-05-23 NOTE — Progress Notes (Signed)
Initial Nutrition Assessment  DOCUMENTATION CODES:   Non-severe (moderate) malnutrition in context of acute illness/injury  INTERVENTION:  - continue Magic Cup once/day.  - will decrease Prosource Plus from BID to once/day. - will increase Boost Breeze from once/day to BID.   NUTRITION DIAGNOSIS:   Moderate Malnutrition related to acute illness, catabolic illness ([id number]-56 infection) as evidenced by mild fat depletion, mild muscle depletion, moderate fat depletion, moderate muscle depletion. -ongoing, improving  GOAL:   Patient will meet greater than or equal to 90% of their needs -met  MONITOR:   PO intake, Supplement acceptance, Labs, Weight trends  ASSESSMENT:   64 y.o. male with medical history of asthma, obesity, sleep apnea, and type 2 DM. He was admitted with severe shortness of breath, cough, and fevers of 102-104 at home. His symptoms began on 9/13. His wife and mother-in-law also presented at the same time as him for similar symptoms. He is not vaccinated against COVID-19. He was found to be COVID positive in the ED.  He has been accepting Boost Breeze 100% of the time offered and Prosource Plus ~50% of the time offered. Meal intakes over the past week have mainly been 100%.   Estimated nutrition needs adjusted as patient is further out from COVID-19 diagnosis.   Weight has been stable since 10/10. No edema present.   MD note from today states anticipated d/c date of 10/23.    Labs reviewed; CBGs: 123, 234, 75 mg/dl, K: 2.7 mmol/l, Cl: 88 mmol/l, Ca: 8.6 mg/dl.  Medications reviewed; sliding scale novolog, 5 units novolog TID, 18 units levemir BID, 5 mg tradjenta/day, 40 mg solu-medrol BID, 1 tablet multivitamin with minerals/day, 5 ml mycostatin QID, 40 mg oral protonix/day, 10 mEq IV KCl x4 runs 10/19, 2 tablets senokot BID, 220 mg zinc sulfate/day.    Diet Order:   Diet Order            Diet heart healthy/carb modified Room service appropriate? Yes; Fluid  consistency: Thin  Diet effective now                 EDUCATION NEEDS:   No education needs have been identified at this time  Skin:  Skin Assessment: Reviewed RN Assessment  Last BM:  10/18 (type 6 x1; type 7 x1)  Height:   Ht Readings from Last 1 Encounters:  04/24/20 _0  (1.753 m)    Weight:   Wt Readings from Last 1 Encounters:  05/23/20 91.5 kg     Estimated Nutritional Needs:  Kcal:  2200-2400 kcal Protein:  110-120 grams Fluid:  >/= 2.2 L/day     Jarome Matin, MS, RD, LDN, CNSC Inpatient Clinical Dietitian RD pager # available in AMION  After hours/weekend pager # available in [facility]

## 2020-05-23 NOTE — Plan of Care (Signed)
  Problem: Health Behavior/Discharge Planning: Goal: Ability to manage health-related needs will improve Outcome: Progressing   

## 2020-05-23 NOTE — Progress Notes (Signed)
[facility]  PROGRESS NOTE  Alee Katen WUG:[id number] DOB: June 27, 1956 DOA: 04/24/2020 PCP: Kristen Loader, FNP   Brief HPI:   64 year old male with past medical history of asthma, OSA not on CPAP, diabetes mellitus type 2, obesity, depression was admitted to ED on 04/24/2020 with acute hypoxic respiratory failure.  COVID-19 PCR was positive.  Chest x-ray showed bilateral multifocal pneumonia.  Patient completed 5 days of IV remdesivir and 14 days course of oral baricitinib.  Also was treated with high-dose steroids.  Patient continues to have high flow oxygen requirement.    Subjective   Patient seen and examined, still requiring 8 L HFNC.  Denies shortness of breath.  He has been getting Lasix 40 mg IV every 12 hours with excellent diuresis.  Blood pressure is soft today.   Assessment/Plan:     1. Acute hypoxemic respiratory failure-secondary to COVID-19 multifocal pneumonia.  Patient completed 5 days of IV remdesivir, 14 days of oral baricitinib.  Patient continues to require oxygen 10 to 15 L/min.  He is currently on Solu-Medrol 40 mg IV every 12 hours and Lasix dose was changed to 40 mg every 12 hours.  He is more than 3 weeks from day of diagnosis so he is currently off isolation.   Chest x-ray on 05/17/2020 showed multifocal pneumonia. CRP is down to 0.8.  WBC is down to 12.4.  Repeat chest x-ray again shows multifocal pneumonia.  Patient has diuresed -17 L on IV Lasix.  Patient blood pressure is soft today, will discontinue IV Lasix and potassium supplementation.  Will monitor.  If blood pressure improves, consider starting low-dose Lasix 40 mg p.o. daily.  Continue Tessalon Perles, Mucinex as needed. 2. Hypokalemia-potassium is 2.7.  Potassium is being replaced.  Follow BMP in am.    3. Diabetes mellitus type 2-hemoglobin A1c 8.4.  Continue Levemir 18 units subcu twice a day, linagliptin.  Continue premeal NovoLog 8 units subcu 3 times daily.  CBG fairly well  controlled. 4. OSA-continue CPAP 5. Acute bilateral lower extremity DVTs-continue Xarelto 6. Depression/anxiety-continue as needed Ativan 7. Chronic diastolic CHF-echo from 8/88/2800 showed EF 60 to 34%, grade 1 diastolic dysfunction.  Lasix on hold as above.      COVID-19 Labs  No results for input(s): DDIMER, FERRITIN, LDH, CRP in the last 72 hours.  Lab Results  Component Value Date   SARSCOV2NAA POSITIVE (A) 04/24/2020     Scheduled medications:   . (feeding supplement) PROSource Plus  30 mL Oral BID BM  . vitamin C  500 mg Oral Daily  . feeding supplement  1 Container Oral Q24H  . guaiFENesin  1,200 mg Oral BID  . insulin aspart  0-20 Units Subcutaneous TID WC  . insulin aspart  0-5 Units Subcutaneous QHS  . insulin aspart  5 Units Subcutaneous TID WC  . insulin detemir  18 Units Subcutaneous BID  . Ipratropium-Albuterol  1 puff Inhalation TID  . linagliptin  5 mg Oral Daily  . magic mouthwash w/lidocaine  10 mL Oral QID  . mouth rinse  15 mL Mouth Rinse BID  . methylPREDNISolone (SOLU-MEDROL) injection  40 mg Intravenous Q12H  . montelukast  10 mg Oral QHS  . multivitamin with minerals  1 tablet Oral Daily  . nystatin  5 mL Oral QID  . pantoprazole  40 mg Oral Daily  . QUEtiapine  25 mg Oral QHS  . rivaroxaban  20 mg Oral Q supper  . senna-docusate  2 tablet Oral BID  .  sertraline  100 mg Oral Daily  . valACYclovir  1,000 mg Oral Daily  . zinc sulfate  220 mg Oral Daily         CBG: Recent Labs  Lab 05/22/20 1056 05/22/20 1601 05/22/20 2314 05/23/20 0745 05/23/20 1156  GLUCAP 181* 100* 198* 123* 234*    SpO2: 90 % O2 Flow Rate (L/min): 8 L/min FiO2 (%): 100 %    CBC: Recent Labs  Lab 05/18/20 0254  WBC 12.4*  HGB 11.7*  HCT 35.1*  MCV 90.0  PLT 630    Basic Metabolic Panel: Recent Labs  Lab 05/19/20 0237 05/20/20 0836 05/21/20 0459 05/22/20 0431 05/23/20 0414  NA 137 137 137 134* 135  K 3.7 3.0* 2.9* 2.8* 2.7*  CL 97* 90*  88* 89* 88*  CO2 30 36* 36* 35* 35*  GLUCOSE 180* 91 114* 130* 130*  BUN 22 24* 31* 23 23  CREATININE 0.83 0.67 0.68 0.67 0.69  CALCIUM 8.3* 8.5* 8.8* 8.0* 8.6*  MG  --   --   --  2.2  --      Liver Function Tests: Recent Labs  Lab 05/18/20 0254  AST 23  ALT 34  ALKPHOS 63  BILITOT 0.4  PROT 5.8*  ALBUMIN 2.6*     Antibiotics: Anti-infectives (From admission, onward)   Start     Dose/Rate Route Frequency Ordered Stop   05/14/20 1130  valACYclovir (VALTREX) tablet 500 mg  Status:  Discontinued        500 mg Oral Daily 05/14/20 1038 05/14/20 1041   05/14/20 1130  valACYclovir (VALTREX) tablet 1,000 mg        1,000 mg Oral Daily 05/14/20 1041     04/25/20 1000  remdesivir 100 mg in sodium chloride 0.9 % 100 mL IVPB        100 mg 200 mL/hr over 30 Minutes Intravenous Daily 04/24/20 1547 04/28/20 0945   04/25/20 1000  remdesivir 100 mg in sodium chloride 0.9 % 100 mL IVPB  Status:  Discontinued       "Followed by" Linked Group Details   100 mg 200 mL/hr over 30 Minutes Intravenous Daily 04/24/20 1633 04/24/20 1635   04/24/20 1645  remdesivir 200 mg in sodium chloride 0.9% 250 mL IVPB  Status:  Discontinued       "Followed by" Linked Group Details   200 mg 580 mL/hr over 30 Minutes Intravenous Once 04/24/20 1633 04/24/20 1635   04/24/20 1630  remdesivir 200 mg in sodium chloride 0.9% 250 mL IVPB        200 mg 580 mL/hr over 30 Minutes Intravenous Once 04/24/20 1547 04/24/20 1802       DVT prophylaxis: Xarelto  Code Status: Full code  Family Communication: No family at bedside    Status is: Inpatient  Dispo: The patient is from: Home              Anticipated d/c is to: LTAC versus CIR              Anticipated d/c date is: 05/27/2020              Patient currently not medically stable for discharge, requiring high flow oxygen  Barrier to discharge-still requiring high flow oxygen via nasal cannula     Consultants:  Critical  care  Procedures:     Objective   Vitals:   05/23/20 0608 05/23/20 0828 05/23/20 1451 05/23/20 1452  BP:   (!) 84/53   Pulse:  96 95  Resp:   18   Temp:   97.6 F (36.4 C)   TempSrc:   Oral   SpO2:  100% (!) 89% 90%  Weight: 91.5 kg     Height:        Intake/Output Summary (Last 24 hours) at 05/23/2020 1605 Last data filed at 05/23/2020 5625 Gross per 24 hour  Intake 0 ml  Output 750 ml  Net -750 ml    10/17 1901 - 10/19 0700 In: 600 [P.O.:600] Out: 4275 [Urine:4275]  Filed Weights   05/20/20 0500 05/22/20 0500 05/23/20 6389  Weight: 92.3 kg 92.2 kg 91.5 kg    Physical Examination:  General-appears in no acute distress Heart-S1-S2, regular, no murmur auscultated Lungs-bibasilar crackles auscultated Abdomen-soft, nontender, no organomegaly Extremities-no edema in the lower extremities Neuro-alert, oriented x3, no focal deficit noted  BNP (last 3 results) Recent Labs    05/08/20 0345  BNP 44.5     [place]   [facility] If 7PM-7AM, please contact night-coverage at [email or web address], Office  [phone]   05/23/2020, 4:05 PM  LOS: 29 days

## 2020-05-23 NOTE — Progress Notes (Signed)
Pt refused the Prosource and Boost given earlier states too sweet, will continue to offer. SRP, RN

## 2020-05-24 DIAGNOSIS — R739 Hyperglycemia, unspecified: Secondary | ICD-10-CM | POA: Diagnosis not present

## 2020-05-24 DIAGNOSIS — U071 COVID-19: Secondary | ICD-10-CM | POA: Diagnosis not present

## 2020-05-24 DIAGNOSIS — E440 Moderate protein-calorie malnutrition: Secondary | ICD-10-CM | POA: Diagnosis not present

## 2020-05-24 DIAGNOSIS — E876 Hypokalemia: Secondary | ICD-10-CM | POA: Diagnosis not present

## 2020-05-24 LAB — BASIC METABOLIC PANEL
Anion gap: 12 (ref 5–15)
BUN: 23 mg/dL (ref 8–23)
CO2: 33 mmol/L — ABNORMAL HIGH (ref 22–32)
Calcium: 8.4 mg/dL — ABNORMAL LOW (ref 8.9–10.3)
Chloride: 91 mmol/L — ABNORMAL LOW (ref 98–111)
Creatinine, Ser: 0.61 mg/dL (ref 0.61–1.24)
GFR, Estimated: >60 mL/min (ref >60)
Glucose, Bld: 151 mg/dL — ABNORMAL HIGH (ref 70–99)
Potassium: 2.8 mmol/L — ABNORMAL LOW (ref 3.5–5.1)
Sodium: 136 mmol/L (ref 135–145)

## 2020-05-24 LAB — GLUCOSE, CAPILLARY
Glucose-Capillary: 87 mg/dL (ref 70–99)
Glucose-Capillary: 205 mg/dL — ABNORMAL HIGH (ref 70–99)
Glucose-Capillary: 129 mg/dL — ABNORMAL HIGH (ref 70–99)
Glucose-Capillary: 94 mg/dL (ref 70–99)

## 2020-05-24 MED ORDER — SODIUM CHLORIDE 0.9 % IV BOLUS: 500.0000 mL | Freq: Once | INTRAVENOUS | Status: DC

## 2020-05-24 MED ORDER — SODIUM CHLORIDE 0.9 % IV BOLUS
250.0000 mL | Freq: Once | INTRAVENOUS | Status: AC
  Administered 2020-05-24: 250 mL via INTRAVENOUS

## 2020-05-24 MED ORDER — LORAZEPAM 0.5 MG PO TABS
0.5000 mg | ORAL_TABLET | ORAL | Status: DC | PRN
  Administered 2020-05-24 – 2020-05-30 (×13): 0.5 mg via ORAL
  Filled 2020-05-24 (×14): qty 1

## 2020-05-24 MED ORDER — POTASSIUM CHLORIDE CRYS ER 20 MEQ PO TBCR
40.0000 meq | EXTENDED_RELEASE_TABLET | ORAL | Status: AC
  Administered 2020-05-24 (×2): 40 meq via ORAL
  Filled 2020-05-24 (×2): qty 2

## 2020-05-24 MED ORDER — PREDNISONE 20 MG PO TABS
40.0000 mg | ORAL_TABLET | Freq: Every day | ORAL | Status: DC
  Administered 2020-05-25 – 2020-05-30 (×6): 40 mg via ORAL
  Filled 2020-05-24 (×6): qty 2

## 2020-05-24 NOTE — Progress Notes (Addendum)
Pt's BP 86/41, rechecked manually with similar results. Pt asymptomatic. MD notified. Will continue to monitor.   250cc bolus of NS ordered. After bolus completed, Pt's BP is 81/51. MD made aware.

## 2020-05-24 NOTE — Progress Notes (Signed)
PROGRESS NOTE    Clayton [name]  SWF:[id number] DOB: 05/10/1956 DOA: 04/24/2020 PCP: Clayton Loader, FNP    Brief Narrative:  Clayton [name] was admitted to the hospital with a working diagnosis of acute hypoxic respiratory failure due to SARS COVID-19 viral pneumonia.  Prolonged hospitalization, now off isolation.  64 year old male with past medical history of asthma, type II Titus mellitus, obstructive sleep apnea and obesity.  Patient developed severe dyspnea at home, associated with fever.  Patient called EMS, his oximetry was 72% and he was placed on supplemental oxygen and transported to the hospital.  Positive sick contacts at home.  On his initial physical examination blood pressure 152/81, heart rate 85, respiratory rate 24-33, oxygen saturation 91% on supplemental oxygen, no wheezing or rails, heart S1-S2, present and rhythmic, soft abdomen, no lower extremity edema  Chest radiograph with bilateral all 4 quadrants interstitial infiltrates.  Patient received medical therapy with high-dose systemic corticosteroids, intravenous remdesivir and 14-day course of baricitinib.   Patient had high oxygen requirements that slowly have been improving.  He required diuresis for noncardiogenic pulmonary edema.  Assessment & Plan:   Active Problems:   Acute hypoxemic respiratory failure due to COVID-19 [facility])   Hypokalemia   Hyperglycemia   Uncontrolled type 2 diabetes mellitus with hyperglycemia (HCC)   Malnutrition of moderate degree   1. Acute hypoxic respiratory failure due to SARS covid viral pneumonia/ complicated with viral sepsis (present on admission).  Sp remdesivir #5/5, baricitinib #14.  RR: 18  Pulse oxymetry: 94 to 96% Fi02: 8 L/ HFNC  Patient continue to be very weak and deconditioned. Continue medical therapy with systemic corticosteroids, with prednisone 40 mg daily with a slow taper. Bronchodilator therapy and airway clearing techniques.   Keep oxygen saturation more  than 88%. Follow with inpatient rehab recommendations.  Continue to hold on diuresis for now.   2. T2DM uncontrolled Hgb A1c 8,4. Continue glucose cover and monitoring with insulin sliding scale, basal insulin 18 units levimir bid and linagliptin.  Pre-meal insulin 8 units.   3. Hypokalemia/ metabolic contraction alkalosis. K today down to 2,0 with bicarbonate at 33. Continue K correction with Kcl 80 meq in 2 divided doses and follow up on renal function in am.   4. Acute bilateral lower extremity DVT. Continue anticoagulation with rivaroxaban.   5. Chronic diastolic heart failure. No signs of acute exacerbation.   6. Depression and anxiety. Continue with as needed alprazolam (change from IV to po), continue daily sertraline and quetiapine at night.   7. Moderate calorie protein malnutrition. Continue with nutritional supplements.   8. Hypotension. Blood pressure 81 to 122 mmHg, will hold on antihypertensive medications.    Status is: Inpatient  Remains inpatient appropriate because:IV treatments appropriate due to intensity of illness or inability to take PO   Dispo: The patient is from: Home              Anticipated d/c is to: CIR              Anticipated d/c date is: 2 days              Patient currently is not medically stable to d/c.  DVT prophylaxis: rivaroxaban   Code Status:   full  Family Communication:  No family at the bedside      Nutrition Status: Nutrition Problem: Moderate Malnutrition Etiology: acute illness, catabolic illness ([id number]-42 infection) Signs/Symptoms: mild fat depletion, mild muscle depletion, moderate fat depletion, moderate muscle depletion Interventions: MVI,  Magic cup, Boost Breeze, Other (Comment) [employer] 1.4 po; Prosource Plus)      Consultants:   Pulmonary      Subjective: Patient continue to have dyspnea on exertion, no nausea or vomiting, no chest pain. Very weak and deconditioned.   Objective: Vitals:   05/24/20 0630  05/24/20 0911 05/24/20 1325 05/24/20 1331  BP: (!) 81/51   122/73  Pulse:    (!) 106  Resp:    18  Temp:    98.3 F (36.8 C)  TempSrc:    Oral  SpO2:  92% 94% 96%  Weight:      Height:        Intake/Output Summary (Last 24 hours) at 05/24/2020 1607 Last data filed at 05/24/2020 0528 Gross per 24 hour  Intake 323.05 ml  Output 1275 ml  Net -951.95 ml   Filed Weights   05/22/20 0500 05/23/20 0608 05/24/20 0457  Weight: 92.2 kg 91.5 kg 93.9 kg    Examination:   General:  deconditioned, positive dyspnea at rest/  Neurology: Awake and alert, non focal  E ENT: mild pallor, no icterus, oral mucosa moist Cardiovascular: No JVD. S1-S2 present, rhythmic, no gallops, rubs, or murmurs. No lower extremity edema. Pulmonary: positive breath sounds bilaterally,  decreased air movement, no wheezing, but positive rhonchi and rales bilaterally. Gastrointestinal. Abdomen soft and non tender Skin. No rashes Musculoskeletal: no joint deformities     Data Reviewed: I have personally reviewed following labs and imaging studies  CBC: Recent Labs  Lab 05/18/20 0254  WBC 12.4*  HGB 11.7*  HCT 35.1*  MCV 90.0  PLT 709   Basic Metabolic Panel: Recent Labs  Lab 05/20/20 0836 05/21/20 0459 05/22/20 0431 05/23/20 0414 05/24/20 0359  NA 137 137 134* 135 136  K 3.0* 2.9* 2.8* 2.7* 2.8*  CL 90* 88* 89* 88* 91*  CO2 36* 36* 35* 35* 33*  GLUCOSE 91 114* 130* 130* 151*  BUN 24* 31* 23 23 23   CREATININE 0.67 0.68 0.67 0.69 0.61  CALCIUM 8.5* 8.8* 8.0* 8.6* 8.4*  MG  --   --  2.2  --   --    GFR: Estimated Creatinine Clearance: 108.3 mL/min (by C-G formula based on SCr of 0.61 mg/dL). Liver Function Tests: Recent Labs  Lab 05/18/20 0254  AST 23  ALT 34  ALKPHOS 63  BILITOT 0.4  PROT 5.8*  ALBUMIN 2.6*   No results for input(s): LIPASE, AMYLASE in the last 168 hours. No results for input(s): AMMONIA in the last 168 hours. Coagulation Profile: No results for input(s): INR,  PROTIME in the last 168 hours. Cardiac Enzymes: No results for input(s): CKTOTAL, CKMB, CKMBINDEX, TROPONINI in the last 168 hours. BNP (last 3 results) No results for input(s): PROBNP in the last 8760 hours. HbA1C: No results for input(s): HGBA1C in the last 72 hours. CBG: Recent Labs  Lab 05/23/20 1156 05/23/20 1658 05/23/20 2132 05/24/20 0735 05/24/20 1118  GLUCAP 234* 75 268* 87 205*   Lipid Profile: No results for input(s): CHOL, HDL, LDLCALC, TRIG, CHOLHDL, LDLDIRECT in the last 72 hours. Thyroid Function Tests: No results for input(s): TSH, T4TOTAL, FREET4, T3FREE, THYROIDAB in the last 72 hours. Anemia Panel: No results for input(s): VITAMINB12, FOLATE, FERRITIN, TIBC, IRON, RETICCTPCT in the last 72 hours.    Radiology Studies: I have reviewed all of the imaging during this hospital visit personally     Scheduled Meds:  (feeding supplement) PROSource Plus  30 mL Oral Daily  vitamin C  500 mg Oral Daily   feeding supplement  1 Container Oral BID BM   guaiFENesin  1,200 mg Oral BID   insulin aspart  0-20 Units Subcutaneous TID WC   insulin aspart  0-5 Units Subcutaneous QHS   insulin aspart  5 Units Subcutaneous TID WC   insulin detemir  18 Units Subcutaneous BID   Ipratropium-Albuterol  1 puff Inhalation TID   linagliptin  5 mg Oral Daily   magic mouthwash w/lidocaine  10 mL Oral QID   mouth rinse  15 mL Mouth Rinse BID   methylPREDNISolone (SOLU-MEDROL) injection  40 mg Intravenous Q12H   montelukast  10 mg Oral QHS   multivitamin with minerals  1 tablet Oral Daily   nystatin  5 mL Oral QID   pantoprazole  40 mg Oral Daily   QUEtiapine  25 mg Oral QHS   rivaroxaban  20 mg Oral Q supper   senna-docusate  2 tablet Oral BID   sertraline  100 mg Oral Daily   valACYclovir  1,000 mg Oral Daily   zinc sulfate  220 mg Oral Daily   Continuous Infusions:   LOS: 30 days        Jodette Wik Quillian Quince Martita Brumm

## 2020-05-24 NOTE — Progress Notes (Signed)
Inpatient Rehab Admissions Coordinator:   I requested a CIR consult, as pt appears to be improving and approaching a level that we could support him on CIR.  Ideally, would like to see pt able to tolerate mobility on 10L or less, while maintaining saturations above 80%.  Will follow from the periphery.    Shann Medal, PT, DPT Admissions Coordinator [phone] 05/24/20  11:50 AM

## 2020-05-25 ENCOUNTER — Inpatient Hospital Stay (HOSPITAL_COMMUNITY): Payer: BC Managed Care – PPO

## 2020-05-25 DIAGNOSIS — E440 Moderate protein-calorie malnutrition: Secondary | ICD-10-CM | POA: Diagnosis not present

## 2020-05-25 DIAGNOSIS — U071 COVID-19: Secondary | ICD-10-CM | POA: Diagnosis not present

## 2020-05-25 DIAGNOSIS — R739 Hyperglycemia, unspecified: Secondary | ICD-10-CM | POA: Diagnosis not present

## 2020-05-25 DIAGNOSIS — E876 Hypokalemia: Secondary | ICD-10-CM | POA: Diagnosis not present

## 2020-05-25 LAB — GLUCOSE, CAPILLARY
Glucose-Capillary: 64 mg/dL — ABNORMAL LOW (ref 70–99)
Glucose-Capillary: 88 mg/dL (ref 70–99)
Glucose-Capillary: 164 mg/dL — ABNORMAL HIGH (ref 70–99)
Glucose-Capillary: 83 mg/dL (ref 70–99)
Glucose-Capillary: 96 mg/dL (ref 70–99)

## 2020-05-25 LAB — CBC
HCT: 33.8 % — ABNORMAL LOW (ref 39.0–52.0)
Hemoglobin: 11.1 g/dL — ABNORMAL LOW (ref 13.0–17.0)
MCH: 30.5 pg (ref 26.0–34.0)
MCHC: 32.8 g/dL (ref 30.0–36.0)
MCV: 92.9 fL (ref 80.0–100.0)
Platelets: 311 10*3/uL (ref 150–400)
RBC: 3.64 MIL/uL — ABNORMAL LOW (ref 4.22–5.81)
RDW: 14 % (ref 11.5–15.5)
WBC: 17 10*3/uL — ABNORMAL HIGH (ref 4.0–10.5)
nRBC: 0 % (ref 0.0–0.2)

## 2020-05-25 LAB — MAGNESIUM: Magnesium: 2.1 mg/dL (ref 1.7–2.4)

## 2020-05-25 LAB — BASIC METABOLIC PANEL
Anion gap: 11 (ref 5–15)
BUN: 18 mg/dL (ref 8–23)
CO2: 31 mmol/L (ref 22–32)
Calcium: 8.4 mg/dL — ABNORMAL LOW (ref 8.9–10.3)
Chloride: 93 mmol/L — ABNORMAL LOW (ref 98–111)
Creatinine, Ser: 0.55 mg/dL — ABNORMAL LOW (ref 0.61–1.24)
GFR, Estimated: >60 mL/min (ref >60)
Glucose, Bld: 78 mg/dL (ref 70–99)
Potassium: 3.1 mmol/L — ABNORMAL LOW (ref 3.5–5.1)
Sodium: 135 mmol/L (ref 135–145)

## 2020-05-25 MED ORDER — POTASSIUM CHLORIDE CRYS ER 20 MEQ PO TBCR
40.0000 meq | EXTENDED_RELEASE_TABLET | ORAL | Status: AC
  Administered 2020-05-25 (×2): 40 meq via ORAL
  Filled 2020-05-25 (×2): qty 2

## 2020-05-25 MED ORDER — FUROSEMIDE 10 MG/ML IJ SOLN
40.0000 mg | Freq: Once | INTRAMUSCULAR | Status: AC
  Administered 2020-05-25: 40 mg via INTRAVENOUS
  Filled 2020-05-25: qty 4

## 2020-05-25 MED ORDER — SODIUM CHLORIDE 0.9 % IV SOLN
2.0000 g | Freq: Three times a day (TID) | INTRAVENOUS | Status: DC
  Administered 2020-05-25 – 2020-05-30 (×16): 2 g via INTRAVENOUS
  Filled 2020-05-25 (×16): qty 2

## 2020-05-25 NOTE — Progress Notes (Signed)
Inpatient Diabetes Program Recommendations  AACE/ADA: New Consensus Statement on Inpatient Glycemic Control (2015)  Target Ranges:  Prepandial:   less than 140 mg/dL      Peak postprandial:   less than 180 mg/dL (1-2 hours)      Critically ill patients:  140 - 180 mg/dL   Lab Results  Component Value Date   GLUCAP 88 05/25/2020   HGBA1C 8.4 (H) 04/25/2020    Review of Glycemic Control  Diabetes history: DM2 Outpatient Diabetes medications: metformin 750 mg bid, glipizide 5 mg QAM Current orders for Inpatient glycemic control: Novolog 0-20 units tidwc and 0-5 units QHS + 5 units tidwc, Levemir 18 units bid, tradjenta 5 mg QD  HgbA1C - 8.4% Glucose this am - 64, 88 mg/dL  Inpatient Diabetes Program Recommendations:     Decrease Levemir to 16 units bid  Will continue to follow.  Thank you. Lorenda Peck, RD, LDN, CDE Inpatient Diabetes Coordinator [phone]

## 2020-05-25 NOTE — Progress Notes (Signed)
Attempted to remove patients NRB, patient immediately became very anxious, heart rate up to 120's, oxygen saturation dropped down to 78 but rebounded to 84% before reapplying NRB.  Ativan given.  MD notified.

## 2020-05-25 NOTE — Progress Notes (Signed)
Inpatient Rehab Admissions:  I spoke to the patient over the phone to discuss CIR recommendations.  Pt is open to CIR to maximize independence prior to returning home with his wife, 62 y/o son, and 58 y/o mother.  Feel that he would be an excellent candidate for CIR once his O2 requirements improve slightly.  He is mobilizing well from a functional assistance standpoint.  I did note that his respiratory rate steadily increased throughout our conversation; encouraged some PLB to help reduce rate and maintain O2 saturations once conversation ended.  I will continue to follow for possible rehab admission.  As stated in my previous note (10/20), would ideally like to see pt able to mobilize on 10L or less, while maintaining saturations above 80%.    Signed: Shann Medal, PT, DPT Admissions Coordinator [phone] 05/25/20  10:44 AM

## 2020-05-25 NOTE — Progress Notes (Signed)
Encouraged patient to remove PNRBM, once removed patient's HFNC was titrated up to 15L to maintain an O2 saturation of 90% per orders.  Encouraged patient to perform breathing exercises and to breath in through his nose as patient is prone to mouth breathing. Patient also encouraged to get up to [facility] for BM when patient asked for bedpan. Previously patient was titrated down to 8L and then up to 10L yesterday, but patient was still utilizing [facility] for intermittent desaturation with coughing. However patient became reliant on [facility] and was hesitant to take the mask off at all.  Patient currently maintaining O2 saturation without PNRBM, will continue to encourage patient to perform breathing exercises and mobilize for lung function.

## 2020-05-25 NOTE — Progress Notes (Signed)
PHARMACY NOTE -  cefepime  Pharmacy has been assisting with dosing of cefepime for HAP.  Dosage remains stable at 2g IV q8 hr and need for further dosage adjustment appears unlikely at present given SCr at baseline  Pharmacy will sign off, following peripherally for culture results or dose adjustments. Please reconsult if a change in clinical status warrants re-evaluation of dosage.  Reuel Boom, PharmD, BCPS [phone] 05/25/2020, 3:54 PM

## 2020-05-25 NOTE — Progress Notes (Addendum)
PROGRESS NOTE    Clayton [name]  ZHY:[id number] DOB: [birth year] DOA: 04/24/2020 PCP: Kristen Loader, FNP    Brief Narrative:  Clayton [name] was admitted to the hospital with a working diagnosis of acute hypoxic respiratory failure due to SARS COVID-19 viral pneumonia.  Prolonged hospitalization, now off isolation.  64 year old male with past medical history of asthma, type II Titus mellitus, obstructive sleep apnea and obesity.  Patient developed severe dyspnea at home, associated with fever.  Patient called EMS, his oximetry was 72% and he was placed on supplemental oxygen and transported to the hospital.  Positive sick contacts at home.  On his initial physical examination blood pressure 152/81, heart rate 85, respiratory rate 24-33, oxygen saturation 91% on supplemental oxygen, no wheezing or rails, heart S1-S2, present and rhythmic, soft abdomen, no lower extremity edema  Chest radiograph with bilateral all 4 quadrants interstitial infiltrates.  Patient received medical therapy with high-dose systemic corticosteroids, intravenous remdesivir and 14-day course of baricitinib.   Patient had high oxygen requirements that slowly have been improving.  He required diuresis for noncardiogenic pulmonary edema.  Now pending transfer to LTAC or CIR. If patient able to mobilize with 10 L/ min per [place], and keep oxygen saturation more than 80%, inpatient rehab can potentially take him.    Assessment & Plan:   Active Problems:   Acute hypoxemic respiratory failure due to COVID-19 [facility])   Hypokalemia   Hyperglycemia   Uncontrolled type 2 diabetes mellitus with hyperglycemia (HCC)   Malnutrition of moderate degree    1. Acute hypoxic respiratory failure due to SARS covid viral pneumonia/ complicated with viral sepsis (present on admission).  Sp remdesivir #5/5, baricitinib #14  RR: 20 Pulse oxymetry: 94%  Fi02: 8 L/ HFNC plus NRBM   Continue with a slow systemic corticosteroids taper, now  on prednisone 40 mg daily. On bronchodilator therapy and airway clearing techniques. Out of bed to chair tid as tolerated.   Possible transfer to CIR if continue to improve oxygenation.   Patient has been on [place]. Follow chest film today, rule out non cardiogenic pulmonary edema or bacterial over infection.  Wbc is 17. Start patient on cefepime for possible bacterial over infection.   Continue to monitor oxygen saturation, if persistent high requirements, he may need LTAC.   2. T2DM uncontrolled Hgb A1c 8,4.  Fasting glucose this am is 78, capillary 68, 88 and 164. Continue with insulin sliding scale, basal insulin 18 units levimir bid and linagliptin. Discontinue pre-meal insulin for now.   3. Hypokalemia/ metabolic contraction alkalosis.  Stable renal function with serum cr at 0,55, continue to have low K down to 3,74m will add 80 meq in 2 divided doses.  Follow on renal panel in am.  4. Acute bilateral lower extremity DVT. Tolerating well anticoagulation with rivaroxaban.   5. Chronic diastolic heart failure. No sings of acute decompensation.  6. Depression and anxiety/ back pain. PRN lorazepam, plus daily sertraline and quetiapine at night.  Continue pain control with morphine and hydrocodone.   7. Moderate calorie protein malnutritior. On nutritional supplements.   8. Hypotension. Continue blood pressure monitoring.      Status is: Inpatient  Remains inpatient appropriate because:Inpatient level of care appropriate due to severity of illness   Dispo: The patient is from: Home              Anticipated d/c is to: CIR              Anticipated d/c date  is: > 3 days              Patient currently is not medically stable to d/c.   DVT prophylaxis: rivaroxaban   Code Status:   full  Family Communication:  Patient talking to his family,  I was not able to reach his wife, left a message on her voicemail.     Nutrition Status: Nutrition Problem: Moderate  Malnutrition Etiology: acute illness, catabolic illness (TKZSW-10 infection) Signs/Symptoms: mild fat depletion, mild muscle depletion, moderate fat depletion, moderate muscle depletion Interventions: MVI, Magic cup, Boost Breeze, Other (Comment) [employer] 1.4 po; Prosource Plus)      Subjective: Patient continue to be very weak and deconditioned. Has been using non re-breather mask since yesterday. Decreased mobility due to dyspnea.   Objective: Vitals:   05/24/20 1804 05/24/20 2030 05/25/20 0438 05/25/20 0834  BP:  130/79 124/73   Pulse:  (!) 105 (!) 102   Resp:  20 20   Temp:  98.2 F (36.8 C) 97.7 F (36.5 C)   TempSrc:  Oral Oral   SpO2: 95% 94% 90% 94%  Weight:   93.7 kg   Height:        Intake/Output Summary (Last 24 hours) at 05/25/2020 0935 Last data filed at 05/25/2020 0439 Gross per 24 hour  Intake --  Output 700 ml  Net -700 ml   Filed Weights   05/23/20 0608 05/24/20 0457 05/25/20 0438  Weight: 91.5 kg 93.9 kg 93.7 kg    Examination:   General: deconditioned and positive dyspnea at rest.  Neurology: Awake and alert, non focal  E ENT: positive pallor, no icterus, oral mucosa moist Cardiovascular: No JVD. S1-S2 present, rhythmic, no gallops, rubs, or murmurs. No lower extremity edema. Pulmonary: positive breath sounds bilaterally, with no wheezing, positive bilateral rhonchi and rales. Gastrointestinal. Abdomen soft and non tender Skin. No rashes Musculoskeletal: no joint deformities     Data Reviewed: I have personally reviewed following labs and imaging studies  CBC: Recent Labs  Lab 05/25/20 0420  WBC 17.0*  HGB 11.1*  HCT 33.8*  MCV 92.9  PLT 932   Basic Metabolic Panel: Recent Labs  Lab 05/21/20 0459 05/22/20 0431 05/23/20 0414 05/24/20 0359 05/25/20 0420  NA 137 134* 135 136 135  K 2.9* 2.8* 2.7* 2.8* 3.1*  CL 88* 89* 88* 91* 93*  CO2 36* 35* 35* 33* 31  GLUCOSE 114* 130* 130* 151* 78  BUN 31* 23 23 23 18   CREATININE  0.68 0.67 0.69 0.61 0.55*  CALCIUM 8.8* 8.0* 8.6* 8.4* 8.4*  MG  --  2.2  --   --  2.1   GFR: Estimated Creatinine Clearance: 108.2 mL/min (A) (by C-G formula based on SCr of 0.55 mg/dL (L)). Liver Function Tests: No results for input(s): AST, ALT, ALKPHOS, BILITOT, PROT, ALBUMIN in the last 168 hours. No results for input(s): LIPASE, AMYLASE in the last 168 hours. No results for input(s): AMMONIA in the last 168 hours. Coagulation Profile: No results for input(s): INR, PROTIME in the last 168 hours. Cardiac Enzymes: No results for input(s): CKTOTAL, CKMB, CKMBINDEX, TROPONINI in the last 168 hours. BNP (last 3 results) No results for input(s): PROBNP in the last 8760 hours. HbA1C: No results for input(s): HGBA1C in the last 72 hours. CBG: Recent Labs  Lab 05/24/20 1118 05/24/20 1630 05/24/20 2026 05/25/20 0837 05/25/20 0930  GLUCAP 205* 129* 94 64* 88   Lipid Profile: No results for input(s): CHOL, HDL, LDLCALC, TRIG, CHOLHDL,  LDLDIRECT in the last 72 hours. Thyroid Function Tests: No results for input(s): TSH, T4TOTAL, FREET4, T3FREE, THYROIDAB in the last 72 hours. Anemia Panel: No results for input(s): VITAMINB12, FOLATE, FERRITIN, TIBC, IRON, RETICCTPCT in the last 72 hours.    Radiology Studies: I have reviewed all of the imaging during this hospital visit personally     Scheduled Meds: . (feeding supplement) PROSource Plus  30 mL Oral Daily  . vitamin C  500 mg Oral Daily  . feeding supplement  1 Container Oral BID BM  . guaiFENesin  1,200 mg Oral BID  . insulin aspart  0-20 Units Subcutaneous TID WC  . insulin aspart  0-5 Units Subcutaneous QHS  . insulin aspart  5 Units Subcutaneous TID WC  . insulin detemir  18 Units Subcutaneous BID  . Ipratropium-Albuterol  1 puff Inhalation TID  . linagliptin  5 mg Oral Daily  . magic mouthwash w/lidocaine  10 mL Oral QID  . mouth rinse  15 mL Mouth Rinse BID  . montelukast  10 mg Oral QHS  . multivitamin with  minerals  1 tablet Oral Daily  . nystatin  5 mL Oral QID  . pantoprazole  40 mg Oral Daily  . predniSONE  40 mg Oral Q breakfast  . QUEtiapine  25 mg Oral QHS  . rivaroxaban  20 mg Oral Q supper  . senna-docusate  2 tablet Oral BID  . sertraline  100 mg Oral Daily  . valACYclovir  1,000 mg Oral Daily  . zinc sulfate  220 mg Oral Daily   Continuous Infusions:   LOS: 31 days        Clayton [name] Gerome Apley, MD

## 2020-05-26 DIAGNOSIS — E876 Hypokalemia: Secondary | ICD-10-CM | POA: Diagnosis not present

## 2020-05-26 DIAGNOSIS — E440 Moderate protein-calorie malnutrition: Secondary | ICD-10-CM | POA: Diagnosis not present

## 2020-05-26 DIAGNOSIS — U071 COVID-19: Secondary | ICD-10-CM | POA: Diagnosis not present

## 2020-05-26 DIAGNOSIS — R739 Hyperglycemia, unspecified: Secondary | ICD-10-CM | POA: Diagnosis not present

## 2020-05-26 LAB — CBC WITH DIFFERENTIAL/PLATELET
Abs Immature Granulocytes: 0.25 10*3/uL — ABNORMAL HIGH (ref 0.00–0.07)
Basophils Absolute: 0.1 10*3/uL (ref 0.0–0.1)
Basophils Relative: 0 %
Eosinophils Absolute: 0.2 10*3/uL (ref 0.0–0.5)
Eosinophils Relative: 1 %
HCT: 34.6 % — ABNORMAL LOW (ref 39.0–52.0)
Hemoglobin: 11 g/dL — ABNORMAL LOW (ref 13.0–17.0)
Immature Granulocytes: 1 %
Lymphocytes Relative: 5 %
Lymphs Abs: 1.1 10*3/uL (ref 0.7–4.0)
MCH: 30.5 pg (ref 26.0–34.0)
MCHC: 31.8 g/dL (ref 30.0–36.0)
MCV: 95.8 fL (ref 80.0–100.0)
Monocytes Absolute: 1.6 10*3/uL — ABNORMAL HIGH (ref 0.1–1.0)
Monocytes Relative: 7 %
Neutro Abs: 18.1 10*3/uL — ABNORMAL HIGH (ref 1.7–7.7)
Neutrophils Relative %: 86 %
Platelets: 275 10*3/uL (ref 150–400)
RBC: 3.61 MIL/uL — ABNORMAL LOW (ref 4.22–5.81)
RDW: 14.2 % (ref 11.5–15.5)
WBC: 21.3 10*3/uL — ABNORMAL HIGH (ref 4.0–10.5)
nRBC: 0 % (ref 0.0–0.2)

## 2020-05-26 LAB — BASIC METABOLIC PANEL
Anion gap: 10 (ref 5–15)
BUN: 8 mg/dL (ref 8–23)
CO2: 30 mmol/L (ref 22–32)
Calcium: 8.2 mg/dL — ABNORMAL LOW (ref 8.9–10.3)
Chloride: 95 mmol/L — ABNORMAL LOW (ref 98–111)
Creatinine, Ser: 0.48 mg/dL — ABNORMAL LOW (ref 0.61–1.24)
GFR, Estimated: >60 mL/min (ref >60)
Glucose, Bld: 44 mg/dL — CL (ref 70–99)
Potassium: 3.7 mmol/L (ref 3.5–5.1)
Sodium: 135 mmol/L (ref 135–145)

## 2020-05-26 LAB — GLUCOSE, CAPILLARY
Glucose-Capillary: 54 mg/dL — ABNORMAL LOW (ref 70–99)
Glucose-Capillary: 55 mg/dL — ABNORMAL LOW (ref 70–99)
Glucose-Capillary: 111 mg/dL — ABNORMAL HIGH (ref 70–99)
Glucose-Capillary: 60 mg/dL — ABNORMAL LOW (ref 70–99)
Glucose-Capillary: 153 mg/dL — ABNORMAL HIGH (ref 70–99)
Glucose-Capillary: 109 mg/dL — ABNORMAL HIGH (ref 70–99)
Glucose-Capillary: 180 mg/dL — ABNORMAL HIGH (ref 70–99)
Glucose-Capillary: 214 mg/dL — ABNORMAL HIGH (ref 70–99)

## 2020-05-26 MED ORDER — SODIUM CHLORIDE 0.9 % IV SOLN: INTRAVENOUS | Status: DC | PRN

## 2020-05-26 MED ORDER — FUROSEMIDE 10 MG/ML IJ SOLN
40.0000 mg | Freq: Every day | INTRAMUSCULAR | Status: DC
  Administered 2020-05-26 – 2020-05-30 (×5): 40 mg via INTRAVENOUS
  Filled 2020-05-26 (×5): qty 4

## 2020-05-26 MED ORDER — BENZONATATE 100 MG PO CAPS
100.0000 mg | ORAL_CAPSULE | Freq: Three times a day (TID) | ORAL | Status: DC | PRN
  Administered 2020-05-26 – 2020-05-28 (×3): 100 mg via ORAL
  Filled 2020-05-26 (×3): qty 1

## 2020-05-26 MED ORDER — POTASSIUM CHLORIDE CRYS ER 20 MEQ PO TBCR
40.0000 meq | EXTENDED_RELEASE_TABLET | Freq: Once | ORAL | Status: AC
  Administered 2020-05-26: 40 meq via ORAL
  Filled 2020-05-26: qty 2

## 2020-05-26 NOTE — TOC Progression Note (Signed)
Transition of Care [facility]) - Progression Note    Patient Details  Name: Clayton [name] MRN: [id number] Date of Birth: 1956/06/28  Transition of Care [facility]) CM/SW Contact  Ross Ludwig, [place] Phone Number: 05/26/2020, 10:24 AM  Clinical Narrative:    Per physician, patient may be a good candidate for [facility].  CSW contacted Doroteo Bradford from [employer] to begin insurance authorization.  CSW continuing to follow patient's progress throughout discharge planning.   Expected Discharge Plan: Home/Self Care Barriers to Discharge: Continued Medical Work up  Expected Discharge Plan and Services Expected Discharge Plan: Home/Self Care   Discharge Planning Services: CM Consult   Living arrangements for the past 2 months: Single Family Home                                       Social Determinants of Health (SDOH) Interventions    Readmission Risk Interventions No flowsheet data found.

## 2020-05-26 NOTE — Progress Notes (Signed)
PROGRESS NOTE    Zealand Boyett  KVQ:[id number] DOB: 1955/11/19 DOA: 04/24/2020 PCP: Kristen Loader, FNP    Brief Narrative:  Mr. Gergen was admitted to the hospital with a working diagnosis of acute hypoxic respiratory failure due to SARS COVID-19 viral pneumonia. Prolonged hospitalization, now off isolation.  64 year old male with past medical history of asthma, type II Titus mellitus, obstructive sleep apnea and obesity. Patient developed severe dyspnea at home, associated with fever. Patient called EMS, his oximetry was 72% and he was placed on supplemental oxygen and transported to the hospital. Positive sick contacts at home. On his initial physical examination blood pressure 152/81, heart rate 85, respiratory rate 24-33, oxygen saturation 91% on supplemental oxygen, no wheezing or rails, heart S1-S2, present and rhythmic, soft abdomen, no lower extremity edema  Chest radiograph with bilateral all 4 quadrants interstitial infiltrates.  Patient received medical therapy with high-dose systemic corticosteroids, intravenous remdesivir and 14-day course of baricitinib.  Patient had high oxygen requirements that slowly have been improving. He required diuresis for noncardiogenic pulmonary edema.  Resumed diuresis on 10/21 for worsening hypoxemia and recurrent pulmonary edema. Persistent leukocytosis with worsening pulmonary infiltrates, added cefepime IV.     Assessment & Plan:   Active Problems:   Acute hypoxemic respiratory failure due to COVID-19 [facility])   Hypokalemia   Hyperglycemia   Uncontrolled type 2 diabetes mellitus with hyperglycemia (HCC)   Malnutrition of moderate degree   1. Acute hypoxic respiratory failure due to SARS covid viral pneumonia/ complicated with viral sepsis (present on admission). Complicated with bacterial coinfection.  Sp remdesivir #5/5, baricitinib #14  RR: 20 Pulse oxymetry: 85%  Fi02: 15 L/ HFNC intermittent NRBM  Dyspnea continue  to be moderate to severe. Today did not want to participate with PT.  Continue with systemic corticosteroids taper, prednisone 40 mg daily, on bronchodilator therapy and airway clearing techniques. Out of bed to chair tid as tolerated.   Resume furosemide 40 mg IV, and cefepime #2.    Patient very weak and deconditioned, likely will need a further prolonged hospitalization and persistent high flow supplemental 02.   I spoke with his wife and will look into LTAC.   2. T2DM uncontrolled Hgb A1c 8,4/ hypoglycemia. This am fasting glucose is down to 44, will hold on basal insulin and will contin with sliding scale for now. Patient with very poor oral intake.   3. Hypokalemia/ metabolic contraction alkalosis.  Continue diuresis with furosemide, will add 40 meq Kcl for K of 3,7 and bicarbonate 30. Continue to follow up on renal function in am.   4. Acute bilateral lower extremity DVT. Antithrombotic therapy with rivaroxaban.   5. Acute on chronic diastolic heart failure. now resumed on furosemide. Strict in and out, urine output over last 24 H is 600 cc.   6. Depression and anxiety/ back pain. Continue with as needed lorazepam, daily sertraline and quetiapine at night.  On morphine and hydrocodone for pain control.   7. Moderate calorie protein malnutritior. Continue with nutritional supplements.   8. Hypotension. Continue blood pressure monitoring. Holding on antihypertensive medications.    Patient continue to be at high risk for worsening respiratory failure.   Status is: Inpatient  Remains inpatient appropriate because:IV treatments appropriate due to intensity of illness or inability to take PO   Dispo: The patient is from: Home              Anticipated d/c is to: LTAC  Anticipated d/c date is: > 3 days              Patient currently is not medically stable to d/c.   DVT prophylaxis: rivaroxaban   Code Status:   full  Family Communication:  I spoke over  the phone with the patient's wife about patient's  condition, plan of care, prognosis and all questions were addressed.     Nutrition Status: Nutrition Problem: Moderate Malnutrition Etiology: acute illness, catabolic illness ([id number]-73 infection) Signs/Symptoms: mild fat depletion, mild muscle depletion, moderate fat depletion, moderate muscle depletion Interventions: MVI, Magic cup, Boost Breeze, Other (Comment) [employer] 1.4 po; Prosource Plus)     Antimicrobials:   Cefepime 10/21    Subjective: Patient continue to have significant dyspnea, worse with movement, using intermittent non rebreather mask. Poor oral intake.   Objective: Vitals:   05/26/20 0825 05/26/20 0828 05/26/20 1346 05/26/20 1422  BP:   121/80   Pulse:   (!) 111   Resp:   18   Temp:   97.9 F (36.6 C)   TempSrc:   Oral   SpO2: (!) 89% (!) 89% 97% (!) 85%  Weight:      Height:        Intake/Output Summary (Last 24 hours) at 05/26/2020 1543 Last data filed at 05/26/2020 1300 Gross per 24 hour  Intake 540 ml  Output 1150 ml  Net -610 ml   Filed Weights   05/24/20 0457 05/25/20 0438 05/26/20 0518  Weight: 93.9 kg 93.7 kg 94 kg    Examination:   General: ill looking appearing and deconditioned  Neurology: Awake and alert, non focal  E ENT: no pallor, no icterus, oral mucosa moist Cardiovascular: No JVD. S1-S2 present, rhythmic, no gallops, rubs, or murmurs. No lower extremity edema. Pulmonary: positive breath sounds bilaterally, decreased air movement, no wheezing, bilateral rhonchi and rales. Gastrointestinal. Abdomen soft and non tender.  Skin. No rashes Musculoskeletal: no joint deformities     Data Reviewed: I have personally reviewed following labs and imaging studies  CBC: Recent Labs  Lab 05/25/20 0420 05/26/20 0336  WBC 17.0* 21.3*  NEUTROABS  --  18.1*  HGB 11.1* 11.0*  HCT 33.8* 34.6*  MCV 92.9 95.8  PLT 311 220   Basic Metabolic Panel: Recent Labs  Lab  05/22/20 0431 05/23/20 0414 05/24/20 0359 05/25/20 0420 05/26/20 0336  NA 134* 135 136 135 135  K 2.8* 2.7* 2.8* 3.1* 3.7  CL 89* 88* 91* 93* 95*  CO2 35* 35* 33* 31 30  GLUCOSE 130* 130* 151* 78 44*  BUN 23 23 23 18 8   CREATININE 0.67 0.69 0.61 0.55* 0.48*  CALCIUM 8.0* 8.6* 8.4* 8.4* 8.2*  MG 2.2  --   --  2.1  --    GFR: Estimated Creatinine Clearance: 108.3 mL/min (A) (by C-G formula based on SCr of 0.48 mg/dL (L)). Liver Function Tests: No results for input(s): AST, ALT, ALKPHOS, BILITOT, PROT, ALBUMIN in the last 168 hours. No results for input(s): LIPASE, AMYLASE in the last 168 hours. No results for input(s): AMMONIA in the last 168 hours. Coagulation Profile: No results for input(s): INR, PROTIME in the last 168 hours. Cardiac Enzymes: No results for input(s): CKTOTAL, CKMB, CKMBINDEX, TROPONINI in the last 168 hours. BNP (last 3 results) No results for input(s): PROBNP in the last 8760 hours. HbA1C: No results for input(s): HGBA1C in the last 72 hours. CBG: Recent Labs  Lab 05/26/20 0524 05/26/20 0546 05/26/20 2542 05/26/20 0750 05/26/20  [place]   Lipid Profile: No results for input(s): CHOL, HDL, LDLCALC, TRIG, CHOLHDL, LDLDIRECT in the last 72 hours. Thyroid Function Tests: No results for input(s): TSH, T4TOTAL, FREET4, T3FREE, THYROIDAB in the last 72 hours. Anemia Panel: No results for input(s): VITAMINB12, FOLATE, FERRITIN, TIBC, IRON, RETICCTPCT in the last 72 hours.    Radiology Studies: I have reviewed all of the imaging during this hospital visit personally     Scheduled Meds: . (feeding supplement) PROSource Plus  30 mL Oral Daily  . vitamin C  500 mg Oral Daily  . feeding supplement  1 Container Oral BID BM  . guaiFENesin  1,200 mg Oral BID  . insulin aspart  0-20 Units Subcutaneous TID WC  . insulin aspart  0-5 Units Subcutaneous QHS  . Ipratropium-Albuterol  1 puff Inhalation TID  . linagliptin  5 mg  Oral Daily  . magic mouthwash w/lidocaine  10 mL Oral QID  . mouth rinse  15 mL Mouth Rinse BID  . montelukast  10 mg Oral QHS  . multivitamin with minerals  1 tablet Oral Daily  . nystatin  5 mL Oral QID  . pantoprazole  40 mg Oral Daily  . predniSONE  40 mg Oral Q breakfast  . QUEtiapine  25 mg Oral QHS  . rivaroxaban  20 mg Oral Q supper  . senna-docusate  2 tablet Oral BID  . sertraline  100 mg Oral Daily  . valACYclovir  1,000 mg Oral Daily  . zinc sulfate  220 mg Oral Daily   Continuous Infusions: . sodium chloride    . ceFEPime (MAXIPIME) IV Stopped (05/26/20 0615)     LOS: 32 days        Shunta Mclaurin Gerome Apley, MD

## 2020-05-26 NOTE — Progress Notes (Signed)
Inpatient Rehab Admissions Coordinator:   I was able to speak with patient's wife yesterday.  She confirmed that she and pt's 62 y/o son are able to provide some support at discharge.  The patient, his spouse, and his 61 y/o mother were all hospitalized with Covid.  Wife is currently recovering at home and hopes to have increased activity tolerance by pt's discharge from rehab.  Note that pt still occasionally requiring NRB to maintain O2 saturations 2/2 anxiety.  Also note possibility of transfer to [facility], if able to get insurance auth and bed.  I will f/u with the patient on Monday if he remains in house.   Shann Medal, PT, DPT Admissions Coordinator [phone] 05/26/20  10:49 AM

## 2020-05-26 NOTE — Progress Notes (Signed)
Hypoglycemic Event  CBG: 44/53  Treatment:4oz orange juice completed at 0510  Symptoms: Patient cold, no sweating noted  Follow-up CBG: Time:0525 CBG Result:55  Treatment:4oz orange juice completed at 0530  Symptoms: Tremors  Follow-up CBG: Time:0545 CBG Result: 60  Treatment:4oz apple  juice completed at 0555  Symptoms: Tremors  Follow-up CBG: [id number] CBG Result: 111   Possible Reasons for Event: Poor oral intake at Lunch and Dinner while receiving slow acting insulin Levemir on schedule  Comments/MD notified:Patient encouraged to maintain adequate PO intake     Susette Racer

## 2020-05-26 NOTE — Progress Notes (Signed)
OT Cancellation Note  Patient Details Name: Clayton [name] MRN: [id number] DOB: Apr 29, 1956   Cancelled Treatment:    Reason Eval/Treat Not Completed: Patient declined, no reason specified Despite max education and encouragement from OT and patient's RN patient refusing to participate in therapy. Patient states "my mind is made up, I will tomorrow."   Delbert Phenix OT OT pager: [place] 05/26/2020, 10:53 AM

## 2020-05-26 NOTE — Progress Notes (Signed)
PT Cancellation Note  Patient Details Name: Aztlan Coll MRN: [id number] DOB: Aug 04, 1956   Cancelled Treatment:    Reason Eval/Treat Not Completed: Patient declined, no reason specified Pt refusing therapies today.    Lada Fulbright,KATHrine E 05/26/2020, 11:07 AM Arlyce Dice, DPT Acute Rehabilitation Services Pager: [phone] Office: [phone]

## 2020-05-26 NOTE — Progress Notes (Signed)
Patient maintained 93-100% Oxygen saturation overnight on 15L HFNC without use of NRBM. Patient experienced an episode of hypoglycemia this AM that was treated per previous note.  Patient then experienced panic attack and began hyperventilating through his mouth. PRN medications given for anxiety and cough to reduce anxiety as it is a trigger. Patient expectorated a large black and grey clot of mucous from his nose, approximately 1/2 inch in diameter. Following expectoration of mucous, patient's oxygen saturation continued to remain low. Patient was coached on pursed lip breathing, and anxiety reduction, lights were lowered and patient was able to position himself side lying, oxygen sensor was replaced and oxygen saturation remained low (70's). Patient placed on partial nonrebreather, and encouraged to continue attempting to breath through his nose.  Patient states that he is contested, utilized nasal spray, and is continuing to mouth breath. Oxygen saturation recovered to 100% on PNRBM Partial nonrebreather removed, patient's oxygen saturation then remained in high 90's

## 2020-05-26 NOTE — Progress Notes (Signed)
Assumed care of this pt at 1100 am. Pt refused PT and OT . Pt refused to sit in chair. Pt has flat affect and becomes anxious requiring Ativan. Pt encouraged to participate in his care to progress to the next level. Will continue to encourage pt to use IS and wean off non rebreather and to increase his activity level. SRP, RN

## 2020-05-27 DIAGNOSIS — U071 COVID-19: Secondary | ICD-10-CM | POA: Diagnosis not present

## 2020-05-27 DIAGNOSIS — E440 Moderate protein-calorie malnutrition: Secondary | ICD-10-CM | POA: Diagnosis not present

## 2020-05-27 DIAGNOSIS — R739 Hyperglycemia, unspecified: Secondary | ICD-10-CM | POA: Diagnosis not present

## 2020-05-27 DIAGNOSIS — E876 Hypokalemia: Secondary | ICD-10-CM | POA: Diagnosis not present

## 2020-05-27 LAB — GLUCOSE, CAPILLARY
Glucose-Capillary: 82 mg/dL (ref 70–99)
Glucose-Capillary: 226 mg/dL — ABNORMAL HIGH (ref 70–99)
Glucose-Capillary: 207 mg/dL — ABNORMAL HIGH (ref 70–99)
Glucose-Capillary: 211 mg/dL — ABNORMAL HIGH (ref 70–99)
Glucose-Capillary: 309 mg/dL — ABNORMAL HIGH (ref 70–99)

## 2020-05-27 NOTE — Progress Notes (Signed)
At 1300 visit PT was 100% on salter 15 LPM and PRB. Decreased PT to 13 LPM Salter and removed PRB with Sp02 97%. Decreased Salter to 12 lpm with Sp02 88%. MD goal is >=90% awake and >=85% asleep. PT has been awake during this process. Increased Salter to 13 LPM with Sp02 89%. Increased Salter to 15 LPM with Sp02 dropping to 84%. Placed PT on 15 LPM Salter and 15 LPM PRB with Sp02 93%.

## 2020-05-27 NOTE — Progress Notes (Signed)
PROGRESS NOTE    Clayton [name]  IRJ:[id number] DOB: 09/12/1955 DOA: 04/24/2020 PCP: Kristen Loader, FNP    Brief Narrative:  [name] was admitted to the hospital with a working diagnosis of acute hypoxic respiratory failure due to SARS COVID-19 viral pneumonia. Prolonged hospitalization, now off isolation.  64 year old male with past medical history of asthma, type II Titus mellitus, obstructive sleep apnea and obesity. Patient developed severe dyspnea at home, associated with fever. Patient called EMS, his oximetry was 72% and he was placed on supplemental oxygen and transported to the hospital. Positive sick contacts at home. On his initial physical examination blood pressure 152/81, heart rate 85, respiratory rate 24-33, oxygen saturation 91% on supplemental oxygen, no wheezing or rails, heart S1-S2, present and rhythmic, soft abdomen, no lower extremity edema  Chest radiograph with bilateral all 4 quadrants interstitial infiltrates.  Patient received medical therapy with high-dose systemic corticosteroids, intravenous remdesivir and 14-day course of baricitinib.  Patient had high oxygen requirements that slowly have been improving. He required diuresis for noncardiogenic pulmonary edema.  Resumed diuresis on 10/21 for worsening hypoxemia and recurrent pulmonary edema. Persistent leukocytosis with worsening pulmonary infiltrates, added cefepime IV.  Continue to have high oxygen requirements, high flow nasal cannula and non rebreather mask. Very weak and deconditioned.    Assessment & Plan:   Active Problems:   Acute hypoxemic respiratory failure due to COVID-19 [facility])   Hypokalemia   Hyperglycemia   Uncontrolled type 2 diabetes mellitus with hyperglycemia (HCC)   Malnutrition of moderate degree   1. Acute hypoxic respiratory failure due to SARS covid viral pneumonia/ complicated with viral sepsis (present on admission). Complicated with bacterial coinfection.  Sp  remdesivir #5/5, baricitinib #14  RR:20 Pulse oxymetry:94% Fi02:15 L/ HFNC intermittent NRBM (15 to 10 L/min)   Persistent dyspnea, he has not been out of bed.   Tolerating well systemic corticosteroidswill need a slow taper,now on prednisone 40 mg daily. Continue with bronchodilator therapy and airway clearing techniques. Continue to encourage to get out of bed to chair tid as tolerated.   Continue with diuresis with furosemide 40 mg IV, and antibiotic therapy with cefepime #2/8.  Follow up chest film in am.   Will need aggressive pulmonary rehab.   2. T2DM uncontrolled Hgb A1c 8,4/ hypoglycemia. Fasting glucose this am is 82.  Continue to hold on basal insulin for now, continue close monitoring with insulin sliding scale.  Continue to have a very poor oral intake.   3. Hypokalemia/ metabolic contraction alkalosis.Follow up renal function and electrolytes in am.  For now will continue diuresis with furosemide.   4. Acute bilateral lower extremity DVT.Continue withrivaroxaban for anticoagulation.    5. Acute on chronic diastolic heart failure. His urine output over last 24 H is 2,750 cc.   Continue to target a negative fluid balance.   6. Depression and anxiety/ back pain.On lorazepam PRN, daily sertraline and quetiapine at night.  Continue with morphine and hydrocodone for pain control.   7. Moderate calorie protein malnutritior.On nutritional supplements. Patient very weak and deconditioned.   8. Hypotension.Blood pressure 122/78, continue to hold on antihypertensive medications.   Patient continue to be at high risk for worsening respiratory failure   Status is: Inpatient  Remains inpatient appropriate because:IV treatments appropriate due to intensity of illness or inability to take PO   Dispo: The patient is from: Home              Anticipated d/c is to: LTAC  Anticipated d/c date is: 3 days              Patient currently is  not medically stable to d/c.   DVT prophylaxis: rivaroxaban   Code Status:   full  Family Communication:   I spoke over the phone with the patient's wife about patient's  condition, plan of care, prognosis and all questions were addressed.    Nutrition Status: Nutrition Problem: Moderate Malnutrition Etiology: acute illness, catabolic illness (VOHYW-73 infection) Signs/Symptoms: mild fat depletion, mild muscle depletion, moderate fat depletion, moderate muscle depletion Interventions: MVI, Magic cup, Boost Breeze, Other (Comment) [employer] 1.4 po; Prosource Plus)        Subjective: Patient continue to be very weak and deconditioned, has not been out of bed, continue to use non rebreather mask on top of high flow nasal cannula,   Objective: Vitals:   05/27/20 0826 05/27/20 1300 05/27/20 1317 05/27/20 1347  BP:    122/78  Pulse:  94  (!) 106  Resp:  (!) 21  20  Temp:    98.2 F (36.8 C)  TempSrc:    Oral  SpO2: 100% 100% 93% 94%  Weight:      Height:        Intake/Output Summary (Last 24 hours) at 05/27/2020 1554 Last data filed at 05/27/2020 1326 Gross per 24 hour  Intake --  Output 3300 ml  Net -3300 ml   Filed Weights   05/24/20 0457 05/25/20 0438 05/26/20 0518  Weight: 93.9 kg 93.7 kg 94 kg    Examination:   General: deconditioned and ill looking appearing, positive dyspnea at rest.  Neurology: Awake and alert, non focal  E ENT: mild pallor, no icterus, oral mucosa moist Cardiovascular: No JVD. S1-S2 present, rhythmic, no gallops, rubs, or murmurs. No lower extremity edema. Pulmonary: positive breath sounds bilaterally, decreased air movement, no wheezing, scattered rhonchi and rales. Gastrointestinal. Abdomen soft and non tender Skin. No rashes Musculoskeletal: no joint deformities     Data Reviewed: I have personally reviewed following labs and imaging studies  CBC: Recent Labs  Lab 05/25/20 0420 05/26/20 0336  WBC 17.0* 21.3*  NEUTROABS  --   18.1*  HGB 11.1* 11.0*  HCT 33.8* 34.6*  MCV 92.9 95.8  PLT 311 710   Basic Metabolic Panel: Recent Labs  Lab 05/22/20 0431 05/23/20 0414 05/24/20 0359 05/25/20 0420 05/26/20 0336  NA 134* 135 136 135 135  K 2.8* 2.7* 2.8* 3.1* 3.7  CL 89* 88* 91* 93* 95*  CO2 35* 35* 33* 31 30  GLUCOSE 130* 130* 151* 78 44*  BUN 23 23 23 18 8   CREATININE 0.67 0.69 0.61 0.55* 0.48*  CALCIUM 8.0* 8.6* 8.4* 8.4* 8.2*  MG 2.2  --   --  2.1  --    GFR: Estimated Creatinine Clearance: 108.3 mL/min (A) (by C-G formula based on SCr of 0.48 mg/dL (L)). Liver Function Tests: No results for input(s): AST, ALT, ALKPHOS, BILITOT, PROT, ALBUMIN in the last 168 hours. No results for input(s): LIPASE, AMYLASE in the last 168 hours. No results for input(s): AMMONIA in the last 168 hours. Coagulation Profile: No results for input(s): INR, PROTIME in the last 168 hours. Cardiac Enzymes: No results for input(s): CKTOTAL, CKMB, CKMBINDEX, TROPONINI in the last 168 hours. BNP (last 3 results) No results for input(s): PROBNP in the last 8760 hours. HbA1C: No results for input(s): HGBA1C in the last 72 hours. CBG: Recent Labs  Lab 05/26/20 1145 05/26/20 1703 05/26/20  2035 05/27/20 0734 05/27/20 1152  GLUCAP 109* 180* 214* 82 226*   Lipid Profile: No results for input(s): CHOL, HDL, LDLCALC, TRIG, CHOLHDL, LDLDIRECT in the last 72 hours. Thyroid Function Tests: No results for input(s): TSH, T4TOTAL, FREET4, T3FREE, THYROIDAB in the last 72 hours. Anemia Panel: No results for input(s): VITAMINB12, FOLATE, FERRITIN, TIBC, IRON, RETICCTPCT in the last 72 hours.    Radiology Studies: I have reviewed all of the imaging during this hospital visit personally     Scheduled Meds: . (feeding supplement) PROSource Plus  30 mL Oral Daily  . vitamin C  500 mg Oral Daily  . feeding supplement  1 Container Oral BID BM  . furosemide  40 mg Intravenous Daily  . guaiFENesin  1,200 mg Oral BID  . insulin  aspart  0-20 Units Subcutaneous TID WC  . insulin aspart  0-5 Units Subcutaneous QHS  . Ipratropium-Albuterol  1 puff Inhalation TID  . linagliptin  5 mg Oral Daily  . magic mouthwash w/lidocaine  10 mL Oral QID  . mouth rinse  15 mL Mouth Rinse BID  . montelukast  10 mg Oral QHS  . multivitamin with minerals  1 tablet Oral Daily  . nystatin  5 mL Oral QID  . pantoprazole  40 mg Oral Daily  . predniSONE  40 mg Oral Q breakfast  . QUEtiapine  25 mg Oral QHS  . rivaroxaban  20 mg Oral Q supper  . senna-docusate  2 tablet Oral BID  . sertraline  100 mg Oral Daily  . valACYclovir  1,000 mg Oral Daily  . zinc sulfate  220 mg Oral Daily   Continuous Infusions: . sodium chloride    . ceFEPime (MAXIPIME) IV 2 g (05/27/20 1324)     LOS: 33 days        Harley Fitzwater Gerome Apley, MD

## 2020-05-28 ENCOUNTER — Inpatient Hospital Stay (HOSPITAL_COMMUNITY): Payer: BC Managed Care – PPO

## 2020-05-28 DIAGNOSIS — E876 Hypokalemia: Secondary | ICD-10-CM | POA: Diagnosis not present

## 2020-05-28 DIAGNOSIS — R739 Hyperglycemia, unspecified: Secondary | ICD-10-CM | POA: Diagnosis not present

## 2020-05-28 DIAGNOSIS — U071 COVID-19: Secondary | ICD-10-CM | POA: Diagnosis not present

## 2020-05-28 DIAGNOSIS — E440 Moderate protein-calorie malnutrition: Secondary | ICD-10-CM | POA: Diagnosis not present

## 2020-05-28 LAB — CBC WITH DIFFERENTIAL/PLATELET
Abs Immature Granulocytes: 0.28 10*3/uL — ABNORMAL HIGH (ref 0.00–0.07)
Basophils Absolute: 0 10*3/uL (ref 0.0–0.1)
Basophils Relative: 0 %
Eosinophils Absolute: 0.2 10*3/uL (ref 0.0–0.5)
Eosinophils Relative: 1 %
HCT: 32.1 % — ABNORMAL LOW (ref 39.0–52.0)
Hemoglobin: 10.3 g/dL — ABNORMAL LOW (ref 13.0–17.0)
Immature Granulocytes: 2 %
Lymphocytes Relative: 9 %
Lymphs Abs: 1.3 10*3/uL (ref 0.7–4.0)
MCH: 30.1 pg (ref 26.0–34.0)
MCHC: 32.1 g/dL (ref 30.0–36.0)
MCV: 93.9 fL (ref 80.0–100.0)
Monocytes Absolute: 0.8 10*3/uL (ref 0.1–1.0)
Monocytes Relative: 6 %
Neutro Abs: 11.5 10*3/uL — ABNORMAL HIGH (ref 1.7–7.7)
Neutrophils Relative %: 82 %
Platelets: 350 10*3/uL (ref 150–400)
RBC: 3.42 MIL/uL — ABNORMAL LOW (ref 4.22–5.81)
RDW: 14.2 % (ref 11.5–15.5)
WBC: 14.2 10*3/uL — ABNORMAL HIGH (ref 4.0–10.5)
nRBC: 0 % (ref 0.0–0.2)

## 2020-05-28 LAB — GLUCOSE, CAPILLARY
Glucose-Capillary: 122 mg/dL — ABNORMAL HIGH (ref 70–99)
Glucose-Capillary: 234 mg/dL — ABNORMAL HIGH (ref 70–99)
Glucose-Capillary: 250 mg/dL — ABNORMAL HIGH (ref 70–99)
Glucose-Capillary: 153 mg/dL — ABNORMAL HIGH (ref 70–99)

## 2020-05-28 LAB — BASIC METABOLIC PANEL
Anion gap: 11 (ref 5–15)
BUN: 16 mg/dL (ref 8–23)
CO2: 32 mmol/L (ref 22–32)
Calcium: 8.4 mg/dL — ABNORMAL LOW (ref 8.9–10.3)
Chloride: 91 mmol/L — ABNORMAL LOW (ref 98–111)
Creatinine, Ser: 0.57 mg/dL — ABNORMAL LOW (ref 0.61–1.24)
GFR, Estimated: >60 mL/min (ref >60)
Glucose, Bld: 111 mg/dL — ABNORMAL HIGH (ref 70–99)
Potassium: 3.3 mmol/L — ABNORMAL LOW (ref 3.5–5.1)
Sodium: 134 mmol/L — ABNORMAL LOW (ref 135–145)

## 2020-05-28 MED ORDER — POTASSIUM CHLORIDE CRYS ER 20 MEQ PO TBCR
40.0000 meq | EXTENDED_RELEASE_TABLET | ORAL | Status: AC
  Administered 2020-05-28 (×2): 40 meq via ORAL
  Filled 2020-05-28 (×2): qty 2

## 2020-05-28 NOTE — Progress Notes (Signed)
PROGRESS NOTE    Clayton [name]  GUY:[id number] DOB: [birth year] DOA: 04/24/2020 PCP: Kristen Loader, FNP    Brief Narrative:  Mr. Debrosse was admitted to the hospital with a working diagnosis of acute hypoxic respiratory failure due to SARS COVID-19 viral pneumonia. Prolonged hospitalization, now off isolation.  64 year old male with past medical history of asthma, type II T[place] mellitus, obstructive sleep apnea and obesity. Patient developed severe dyspnea at home, associated with fever. Patient called EMS, his oximetry was 72% and he was placed on supplemental oxygen and transported to the hospital. Positive sick contacts at home. On his initial physical examination blood pressure 152/81, heart rate 85, respiratory rate 24-33, oxygen saturation 91% on supplemental oxygen, no wheezing or rails, heart S1-S2, present and rhythmic, soft abdomen, no lower extremity edema  Chest radiograph with bilateral all 4 quadrants interstitial infiltrates.  Patient received medical therapy with high-dose systemic corticosteroids, intravenous remdesivir and 14-day course of baricitinib.  Patient had high oxygen requirements that slowly have been improving. He required diuresis for noncardiogenic pulmonary edema.  Resumed diuresis on 10/21 for worsening hypoxemia and recurrent pulmonary edema.Persistent leukocytosis with worsening pulmonary infiltrates, added cefepime IV.  Continue to have high oxygen requirements, high flow nasal cannula and non rebreather mask. Very weak and deconditioned.   Plan to transfer to LTAC in the next 37 H, to continue pulmonary rehab.   Assessment & Plan:   Active Problems:   Acute hypoxemic respiratory failure due to COVID-19 [facility])   Hypokalemia   Hyperglycemia   Uncontrolled type 2 diabetes mellitus with hyperglycemia (HCC)   Malnutrition of moderate degree   1. Acute hypoxic respiratory failure due to SARS covid viral pneumonia/ complicated with viral  sepsis (present on admission).Complicated with bacterial coinfection. Sp remdesivir #5/5, baricitinib #14  RR:20 Pulse oxymetry:94% Fi02:15 L/ HFNCintermittent partial re-breather  (15 to 10 L/min)   Today patient has spent 41/2 hours out of the bed to the chair, oxygenation in the 90's. He dose have oxygen desaturation at the time of transition from the bed to chair, consistent with lung de-recruitment.  Wbc is trending down to 14 from 21. Chest film this am with hypoinflation with bilateral diffuse severe bilateral interstitial infiltrates.   Continue with systemic corticosteroidswith prednisone 40 mg daily,bronchodilator therapy and airway clearing techniques.  Tolerating well diuresis with furosemide 40 mg IV, and antibiotic therapy with cefepime #3/8.  Patient will need continue pulmonary rehab, considering is high oxygen requirements and extensive lung injury, likely will benefit more from LTAC than CIR.   2. T2DM uncontrolled Hgb A1c 8,4/ hypoglycemia. Improved hypoglycemia, fasting glucose this am is 111. Patient continue to have a poor oral intake. Continue with linagliptin,   3. Hypokalemia/ metabolic contraction alkalosis.Renal function stable with serum cr at 0.57, K continue to be low at 3,3 , bicarbonate at 32.   Continue K correction with 80 meq Kcl in 2 divided doses, follow up on renal function in am, continue diuresis.   4. Acute bilateral lower extremity DVT.Onrivaroxaban for anticoagulation.    5.Acute on chronic diastolic heart failure. His urine output over last 24 H is 2,250 cc. Chest film with bilateral pulmonary infiltrates.   6. Depression and anxiety/ back pain.Continue with PRN  lorazepam,dailysertraline and quetiapine at night.  On morphine and hydrocodonefor pain control.  7. Moderate calorie protein malnutritior.Continue withnutritional supplements.  He continue to be very weak and deconditioned.   8.  Hypotension.Stable blood pressure, continue diuresis.   9. Resolved oral ulcers, discontinue  valacyclovir.   Patient continue to be at high risk for worsening respiratory failure.   Status is: Inpatient  Remains inpatient appropriate because:IV treatments appropriate due to intensity of illness or inability to take PO   Dispo: The patient is from: Home              Anticipated d/c is to: LTAC              Anticipated d/c date is: 2 days              Patient currently is not medically stable to d/c.   DVT prophylaxis: rivaroxaban   Code Status:   full  Family Communication:  I spoke over the phone with the patient's wife about patient's  condition, plan of care, prognosis and all questions were addressed.    Nutrition Status: Nutrition Problem: Moderate Malnutrition Etiology: acute illness, catabolic illness ([id number] infection) Signs/Symptoms: mild fat depletion, mild muscle depletion, moderate fat depletion, moderate muscle depletion Interventions: MVI, Magic cup, Boost Breeze, Other (Comment) [employer] 1.4 po; Prosource Plus)     Subjective: Patient is awake and alert, he has been 41/2 hours out of bed to chair, positive desaturation with movement, no nausea or vomiting, poor oral intake.   Objective: Vitals:   05/27/20 2200 05/28/20 0456 05/28/20 0853 05/28/20 1329  BP: 110/70 99/78  129/85  Pulse: 100 86  (!) 109  Resp: (!) 22 20  20   Temp:  97.6 F (36.4 C)  98.1 F (36.7 C)  TempSrc:  Oral  Oral  SpO2: 96% 97% 100% 99%  Weight:      Height:        Intake/Output Summary (Last 24 hours) at 05/28/2020 1422 Last data filed at 05/28/2020 1323 Gross per 24 hour  Intake 600 ml  Output 2650 ml  Net -2050 ml   Filed Weights   05/24/20 0457 05/25/20 0438 05/26/20 0518  Weight: 93.9 kg 93.7 kg 94 kg    Examination:   General: positive dyspnea with movement,  Neurology: Awake and alert, non focal  E ENT: positive pallor, no icterus, oral mucosa  moist Cardiovascular: No JVD. S1-S2 present, rhythmic, no gallops, rubs, or murmurs. No lower extremity edema. Pulmonary: positive breath sounds bilaterally, bilateral rhonchi and rales with no wheezing Gastrointestinal. Abdomen soft and non tender Skin. No rashes Musculoskeletal: no joint deformities     Data Reviewed: I have personally reviewed following labs and imaging studies  CBC: Recent Labs  Lab 05/25/20 0420 05/26/20 0336 05/28/20 0448  WBC 17.0* 21.3* 14.2*  NEUTROABS  --  18.1* 11.5*  HGB 11.1* 11.0* 10.3*  HCT 33.8* 34.6* 32.1*  MCV 92.9 95.8 93.9  PLT 311 275 130   Basic Metabolic Panel: Recent Labs  Lab 05/22/20 0431 05/22/20 0431 05/23/20 0414 05/24/20 0359 05/25/20 0420 05/26/20 0336 05/28/20 0448  NA 134*   < > 135 136 135 135 134*  K 2.8*   < > 2.7* 2.8* 3.1* 3.7 3.3*  CL 89*   < > 88* 91* 93* 95* 91*  CO2 35*   < > 35* 33* 31 30 32  GLUCOSE 130*   < > 130* 151* 78 44* 111*  BUN 23   < > 23 23 18 8 16   CREATININE 0.67   < > 0.69 0.61 0.55* 0.48* 0.57*  CALCIUM 8.0*   < > 8.6* 8.4* 8.4* 8.2* 8.4*  MG 2.2  --   --   --  2.1  --   --    < > =  values in this interval not displayed.   GFR: Estimated Creatinine Clearance: 108.3 mL/min (A) (by C-G formula based on SCr of 0.57 mg/dL (L)). Liver Function Tests: No results for input(s): AST, ALT, ALKPHOS, BILITOT, PROT, ALBUMIN in the last 168 hours. No results for input(s): LIPASE, AMYLASE in the last 168 hours. No results for input(s): AMMONIA in the last 168 hours. Coagulation Profile: No results for input(s): INR, PROTIME in the last 168 hours. Cardiac Enzymes: No results for input(s): CKTOTAL, CKMB, CKMBINDEX, TROPONINI in the last 168 hours. BNP (last 3 results) No results for input(s): PROBNP in the last 8760 hours. HbA1C: No results for input(s): HGBA1C in the last 72 hours. CBG: Recent Labs  Lab 05/27/20 1636 05/27/20 1955 05/27/20 2059 05/28/20 0725 05/28/20 1156  GLUCAP 207* 211*  309* 122* 234*   Lipid Profile: No results for input(s): CHOL, HDL, LDLCALC, TRIG, CHOLHDL, LDLDIRECT in the last 72 hours. Thyroid Function Tests: No results for input(s): TSH, T4TOTAL, FREET4, T3FREE, THYROIDAB in the last 72 hours. Anemia Panel: No results for input(s): VITAMINB12, FOLATE, FERRITIN, TIBC, IRON, RETICCTPCT in the last 72 hours.    Radiology Studies: I have reviewed all of the imaging during this hospital visit personally     Scheduled Meds:  (feeding supplement) PROSource Plus  30 mL Oral Daily   vitamin C  500 mg Oral Daily   feeding supplement  1 Container Oral BID BM   furosemide  40 mg Intravenous Daily   guaiFENesin  1,200 mg Oral BID   insulin aspart  0-20 Units Subcutaneous TID WC   insulin aspart  0-5 Units Subcutaneous QHS   Ipratropium-Albuterol  1 puff Inhalation TID   linagliptin  5 mg Oral Daily   magic mouthwash w/lidocaine  10 mL Oral QID   mouth rinse  15 mL Mouth Rinse BID   montelukast  10 mg Oral QHS   multivitamin with minerals  1 tablet Oral Daily   nystatin  5 mL Oral QID   pantoprazole  40 mg Oral Daily   predniSONE  40 mg Oral Q breakfast   QUEtiapine  25 mg Oral QHS   rivaroxaban  20 mg Oral Q supper   senna-docusate  2 tablet Oral BID   sertraline  100 mg Oral Daily   valACYclovir  1,000 mg Oral Daily   zinc sulfate  220 mg Oral Daily   Continuous Infusions:  sodium chloride     ceFEPime (MAXIPIME) IV 2 g (05/28/20 1316)     LOS: 34 days        Rennee Coyne Gerome Apley, MD

## 2020-05-28 NOTE — Plan of Care (Signed)
  Problem: Clinical Measurements: Goal: Will remain free from infection Outcome: Progressing Goal: Diagnostic test results will improve Outcome: Progressing Goal: Respiratory complications will improve Outcome: Progressing Goal: Cardiovascular complication will be avoided Outcome: Progressing   Problem: Activity: Goal: Risk for activity intolerance will decrease Outcome: Progressing   Problem: Nutrition: Goal: Adequate nutrition will be maintained Outcome: Progressing   Problem: Coping: Goal: Level of anxiety will decrease Outcome: Progressing   Problem: Elimination: Goal: Will not experience complications related to bowel motility Outcome: Progressing   Problem: Pain Managment: Goal: General experience of comfort will improve Outcome: Progressing   Problem: Safety: Goal: Ability to remain free from injury will improve Outcome: Progressing   Problem: Skin Integrity: Goal: Risk for impaired skin integrity will decrease Outcome: Progressing

## 2020-05-28 NOTE — Plan of Care (Signed)
  Problem: Clinical Measurements: Goal: Will remain free from infection Outcome: Progressing Goal: Respiratory complications will improve Outcome: Progressing Goal: Cardiovascular complication will be avoided Outcome: Progressing   Problem: Activity: Goal: Risk for activity intolerance will decrease Outcome: Progressing   Problem: Nutrition: Goal: Adequate nutrition will be maintained Outcome: Progressing   Problem: Coping: Goal: Level of anxiety will decrease Outcome: Progressing   Problem: Elimination: Goal: Will not experience complications related to bowel motility Outcome: Progressing   Problem: Pain Managment: Goal: General experience of comfort will improve Outcome: Progressing   Problem: Safety: Goal: Ability to remain free from injury will improve Outcome: Progressing   Problem: Skin Integrity: Goal: Risk for impaired skin integrity will decrease Outcome: Progressing

## 2020-05-28 NOTE — Plan of Care (Signed)
  Problem: Clinical Measurements: Goal: Will remain free from infection Outcome: Progressing   Problem: Nutrition: Goal: Adequate nutrition will be maintained Outcome: Progressing   Problem: Pain Managment: Goal: General experience of comfort will improve Outcome: Progressing   Problem: Safety: Goal: Ability to remain free from injury will improve Outcome: Progressing   Problem: Skin Integrity: Goal: Risk for impaired skin integrity will decrease Outcome: Progressing   Problem: Coping: Goal: Psychosocial and spiritual needs will be supported Outcome: Progressing   Problem: Respiratory: Goal: Will maintain a patent airway Outcome: Progressing Goal: Complications related to the disease process, condition or treatment will be avoided or minimized Outcome: Progressing

## 2020-05-28 NOTE — Progress Notes (Signed)
Occupational Therapy Treatment Patient Details Name: Clayton [name] MRN: [id number] DOB: March 31, 1956 Today's Date: 05/28/2020    History of present illness 64 yo male admitted with COVID, bil DVT. Hx of asthma, obesity, DM.   OT comments  Treatment focused on breathing exercises, reducing anxiety and exercises to improve overall strength and endurance. Patient presents up in chair on 15 L HFNC and 15 NRB. Patient educated on the need to improve inhalation and exhalation length. Therapist had patient download an app to patient's phone as a visual cue to assist with breathing technique as well as constant verbal cues from therapist. Therapist played soft meditative music in background to promote calm environment and reduce patient's anxiety. Patient performed task twice but unable to improve length of breathe - continuing to exhibit short quick gasp predominantly through mouth. Patient performed exercises in chair of upper and lower extremities with rest breaks for recovery between each set. Patient's o2 sat maintained in the high 90s and HR between 120-130. Patient reports fatigue after 5 exercise sets. Patient exhibits gratefulness at end of treatment. Cont POC.   Follow Up Recommendations  LTACH    Equipment Recommendations  Tub/shower seat    Recommendations for Other Services Rehab consult    Precautions / Restrictions Precautions Precautions: Fall Precaution Comments: desats, requires nmuch encouragement to slow down breathing, to not use partial NRB for recovery       Mobility Bed Mobility                  Transfers                      Balance                                           ADL either performed or assessed with clinical judgement   ADL                                               Vision Baseline Vision/History: Wears glasses Wears Glasses: At all times     Perception     Praxis      Cognition  Arousal/Alertness: Awake/alert Behavior During Therapy: WFL for tasks assessed/performed Overall Cognitive Status: Within Functional Limits for tasks assessed                                          Exercises Other Exercises Other Exercises: 10 knee extension each leg, hip hikes x 10 each leg, shoulder fledxion x 10 each arm, reaching forward with bilateral arms x 10, shoulder extension reps x 10 Other Exercises: Breathing exercises x 2 rounds with meditation music to attempt to increase inhalation and exhalation length.   Shoulder Instructions       General Comments      Pertinent Vitals/ Pain       Pain Assessment: No/denies pain  Home Living                                          Prior Functioning/Environment  Frequency  Min 2X/week        Progress Toward Goals  OT Goals(current goals can now be found in the care plan section)  Progress towards OT goals: Progressing toward goals  Acute Rehab OT Goals Patient Stated Goal: to go home OT Goal Formulation: With patient Time For Goal Achievement: 06/06/20 Potential to Achieve Goals: [place] Discharge plan remains appropriate    Co-evaluation          OT goals addressed during session: Strengthening/ROM      AM-PAC OT "6 Clicks" Daily Activity     Outcome Measure   Help from another person eating meals?: None Help from another person taking care of personal grooming?: A Little Help from another person toileting, which includes using toliet, bedpan, or urinal?: A Lot Help from another person bathing (including washing, rinsing, drying)?: A Lot Help from another person to put on and taking off regular upper body clothing?: A Little Help from another person to put on and taking off regular lower body clothing?: A Little 6 Click Score: 17    End of Session Equipment Utilized During Treatment: Oxygen  OT Visit Diagnosis: Other abnormalities of gait and  mobility (R26.89);Muscle weakness (generalized) (M62.81)   Activity Tolerance Other (comment) (limited by anxiety and cardiopulmonary status)   Patient Left     Nurse Communication  (okay to see per Rn)        Time: 3668-1594 OT Time Calculation (min): 23 min  Charges: OT General Charges $OT Visit: 1 Visit OT Treatments $Therapeutic Activity: 8-22 mins $Therapeutic Exercise: 8-22 mins  Favor Kreh, OTR/L [place]  Office [phone] Pager: [place] 05/28/2020, 2:20 PM

## 2020-05-29 DIAGNOSIS — U071 COVID-19: Secondary | ICD-10-CM | POA: Diagnosis not present

## 2020-05-29 DIAGNOSIS — E440 Moderate protein-calorie malnutrition: Secondary | ICD-10-CM | POA: Diagnosis not present

## 2020-05-29 DIAGNOSIS — R739 Hyperglycemia, unspecified: Secondary | ICD-10-CM | POA: Diagnosis not present

## 2020-05-29 DIAGNOSIS — E876 Hypokalemia: Secondary | ICD-10-CM | POA: Diagnosis not present

## 2020-05-29 LAB — CBC WITH DIFFERENTIAL/PLATELET
Abs Immature Granulocytes: 0.49 10*3/uL — ABNORMAL HIGH (ref 0.00–0.07)
Basophils Absolute: 0 10*3/uL (ref 0.0–0.1)
Basophils Relative: 0 %
Eosinophils Absolute: 0.2 10*3/uL (ref 0.0–0.5)
Eosinophils Relative: 2 %
HCT: 33.1 % — ABNORMAL LOW (ref 39.0–52.0)
Hemoglobin: 10.4 g/dL — ABNORMAL LOW (ref 13.0–17.0)
Immature Granulocytes: 5 %
Lymphocytes Relative: 13 %
Lymphs Abs: 1.3 10*3/uL (ref 0.7–4.0)
MCH: 29.6 pg (ref 26.0–34.0)
MCHC: 31.4 g/dL (ref 30.0–36.0)
MCV: 94.3 fL (ref 80.0–100.0)
Monocytes Absolute: 0.7 10*3/uL (ref 0.1–1.0)
Monocytes Relative: 6 %
Neutro Abs: 7.8 10*3/uL — ABNORMAL HIGH (ref 1.7–7.7)
Neutrophils Relative %: 74 %
Platelets: 371 10*3/uL (ref 150–400)
RBC: 3.51 MIL/uL — ABNORMAL LOW (ref 4.22–5.81)
RDW: 14.1 % (ref 11.5–15.5)
WBC: 10.5 10*3/uL (ref 4.0–10.5)
nRBC: 0 % (ref 0.0–0.2)

## 2020-05-29 LAB — GLUCOSE, CAPILLARY
Glucose-Capillary: 129 mg/dL — ABNORMAL HIGH (ref 70–99)
Glucose-Capillary: 208 mg/dL — ABNORMAL HIGH (ref 70–99)
Glucose-Capillary: 318 mg/dL — ABNORMAL HIGH (ref 70–99)
Glucose-Capillary: 146 mg/dL — ABNORMAL HIGH (ref 70–99)

## 2020-05-29 LAB — BASIC METABOLIC PANEL
Anion gap: 10 (ref 5–15)
BUN: 16 mg/dL (ref 8–23)
CO2: 34 mmol/L — ABNORMAL HIGH (ref 22–32)
Calcium: 8.6 mg/dL — ABNORMAL LOW (ref 8.9–10.3)
Chloride: 95 mmol/L — ABNORMAL LOW (ref 98–111)
Creatinine, Ser: 0.55 mg/dL — ABNORMAL LOW (ref 0.61–1.24)
GFR, Estimated: >60 mL/min (ref >60)
Glucose, Bld: 123 mg/dL — ABNORMAL HIGH (ref 70–99)
Potassium: 4.1 mmol/L (ref 3.5–5.1)
Sodium: 139 mmol/L (ref 135–145)

## 2020-05-29 MED ORDER — HYDROCOD POLST-CPM POLST ER 10-8 MG/5ML PO SUER
5.0000 mL | Freq: Two times a day (BID) | ORAL | Status: DC
  Administered 2020-05-29 – 2020-05-30 (×3): 5 mL via ORAL
  Filled 2020-05-29 (×3): qty 5

## 2020-05-29 MED ORDER — GUAIFENESIN-DM 100-10 MG/5ML PO SYRP
10.0000 mL | ORAL_SOLUTION | ORAL | Status: DC | PRN
  Administered 2020-05-29: 10 mL via ORAL
  Filled 2020-05-29: qty 10

## 2020-05-29 MED ORDER — MORPHINE SULFATE (PF) 2 MG/ML IV SOLN
2.0000 mg | INTRAVENOUS | Status: DC | PRN
  Administered 2020-05-30: 2 mg via INTRAVENOUS
  Filled 2020-05-29: qty 1

## 2020-05-29 NOTE — Progress Notes (Signed)
PROGRESS NOTE    Clayton [name]  JIR:[id number] DOB: 1955/10/04 DOA: 04/24/2020 PCP: Kristen Loader, FNP    Brief Narrative:  Clayton [name] was admitted to the hospital with a working diagnosis of acute hypoxic respiratory failure due to SARS COVID-19 viral pneumonia. Prolonged hospitalization, now off isolation.  64 year old male with past medical history of asthma, type II diabetes mellitus, obstructive sleep apnea and obesity. Patient developed severe dyspnea at home, associated with fever. Patient called EMS, his oximetry was 72% and he was placed on supplemental oxygen and transported to the hospital. Positive sick contacts at home. On his initial physical examination blood pressure 152/81, heart rate 85, respiratory rate 24-33, oxygen saturation 91% on supplemental oxygen, no wheezing or rails, heart S1-S2, present and rhythmic, soft abdomen, no lower extremity edema  Chest radiograph with bilateral all 4 quadrants interstitial infiltrates.  Patient received medical therapy with high-dose systemic corticosteroids, intravenous remdesivir and 14-day course of baricitinib.  Patient had high oxygen requirements that slowly have been improving. He required diuresis for noncardiogenic pulmonary edema.  Resumed diuresis on 10/21 for worsening hypoxemia and recurrent pulmonary edema.Persistent leukocytosis with worsening pulmonary infiltrates, added cefepime IV.  Continue to have high oxygen requirements, high flow nasal cannula and non rebreather mask. Very weak and deconditioned.  He dose have oxygen desaturation at the time of transition from the bed to chair, consistent with lung de-recruitment.  10/24 Chest film with hypoinflation positive bilateral diffuse severe bilateral interstitial infiltrates.    Plan to transfer to [facility] pending insurance authorization.    Assessment & Plan:   Active Problems:   Acute hypoxemic respiratory failure due to COVID-19 [facility])    Hypokalemia   Hyperglycemia   Uncontrolled type 2 diabetes mellitus with hyperglycemia (HCC)   Malnutrition of moderate degree   1. Acute hypoxic respiratory failure due to SARS covid viral pneumonia/ complicated with viral sepsis (present on admission).Complicated with bacterial coinfection. Sp remdesivir #5/5, baricitinib #14  RR:20 Pulse oxymetry:94 to 100% Fi02:10 L/ HFNCintermittent partial re-breather (10 L/min)   Oxygen requirements have improved this am, but patient is very weak and deconditioned. WBC is 10.5 and he has persistent cough.   Tolerating well systemic corticosteroidswithprednisone 40 mg daily, continuebronchodilator therapy and airway clearing techniques.Add bid tussionex and continue as needed guaifenesin.   Antibiotic therapy for cefepime #4/8. Continue with diuresis as tolerated.   2. T2DM uncontrolled Hgb A1c 8,4/ hypoglycemia.Very poor oral intake, glucose is 123 this am fasting, capillary 129 and 208.  Continue insulin sliding scale for glucose cover and monitoring, consult nutrition for recommendations.  Continue with linagliptin.   3. Hypokalemia/ metabolic contraction alkalosis.responding well to diuresis, renal function with serum cr at 0,55, K is 4,1 and bicarbonate is 34.   Follow up renal function and electrolytes in am.   4. Acute bilateral lower extremity DVT.Continue anticoagulation with rivaroxaban.  5.Acute on chronic diastolic heart failure.continue furosemide as tolerated to target negative fluids balance.   6. Depression and anxiety/ back pain.On as needed lorazepam,continue dailysertraline. He has been more calm, will discontinue seroquel for now to improve mentation and decreased dizziness, somnolence.   Continue withmorphine and hydrocodonefor pain control.  7. Moderate calorie protein malnutritior.Onnutritional supplements.  8. Hypotension.blood pressure is 135/80 mmHg, will continue with  diuresis with furosemide.   9. Resolved oral ulcers, discontinue valacyclovir.    Patient continue to be at high risk for worsening respiratory failure.   Status is: Inpatient  Remains inpatient appropriate because:IV treatments appropriate due to intensity  of illness or inability to take PO   Dispo: The patient is from: Home              Anticipated d/c is to: LTAC              Anticipated d/c date is: 2 days              Patient currently is not medically stable to d/c.   DVT prophylaxis: Enoxaparin   Code Status:   full  Family Communication:  I spoke with his wife yesterday, will update her in am.      Nutrition Status: Nutrition Problem: Moderate Malnutrition Etiology: acute illness, catabolic illness ([id number] infection) Signs/Symptoms: mild fat depletion, mild muscle depletion, moderate fat depletion, moderate muscle depletion Interventions: MVI, Magic cup, Boost Breeze, Other (Comment) [employer] 1.4 po; Prosource Plus)      Subjective: Patient this am very weak and deconditioned, somnolent, complains of cough, which is productive and persistent. Has not been out of bed today.   Objective: Vitals:   05/28/20 2203 05/28/20 2231 05/29/20 0617 05/29/20 0848  BP: 137/82  135/80   Pulse: (!) 102  84   Resp: 20  (!) 22   Temp: 97.7 F (36.5 C)  97.6 F (36.4 C)   TempSrc: Oral  Oral   SpO2: 95% 91% 93% 99%  Weight:      Height:        Intake/Output Summary (Last 24 hours) at 05/29/2020 1255 Last data filed at 05/29/2020 1052 Gross per 24 hour  Intake 660 ml  Output 3450 ml  Net -2790 ml   Filed Weights   05/24/20 0457 05/25/20 0438 05/26/20 0518  Weight: 93.9 kg 93.7 kg 94 kg    Examination:   General: Not in pain, positive dyspnea at rest.  Neurology: somnolent, but easy to arouse.  E ENT: no pallor, no icterus, oral mucosa dry.  Cardiovascular: No JVD. S1-S2 present, rhythmic, no gallops, rubs, or murmurs. No lower extremity edema. Pulmonary:  positive breath sounds bilaterally, with no wheezing, positive bilateral rhonchi and rales. Gastrointestinal. Abdomen soft and non tender Skin. No rashes Musculoskeletal: no joint deformities     Data Reviewed: I have personally reviewed following labs and imaging studies  CBC: Recent Labs  Lab 05/25/20 0420 05/26/20 0336 05/28/20 0448 05/29/20 0426  WBC 17.0* 21.3* 14.2* 10.5  NEUTROABS  --  18.1* 11.5* 7.8*  HGB 11.1* 11.0* 10.3* 10.4*  HCT 33.8* 34.6* 32.1* 33.1*  MCV 92.9 95.8 93.9 94.3  PLT 311 275 350 295   Basic Metabolic Panel: Recent Labs  Lab 05/24/20 0359 05/25/20 0420 05/26/20 0336 05/28/20 0448 05/29/20 0426  NA 136 135 135 134* 139  K 2.8* 3.1* 3.7 3.3* 4.1  CL 91* 93* 95* 91* 95*  CO2 33* 31 30 32 34*  GLUCOSE 151* 78 44* 111* 123*  BUN 23 18 8 16 16   CREATININE 0.61 0.55* 0.48* 0.57* 0.55*  CALCIUM 8.4* 8.4* 8.2* 8.4* 8.6*  MG  --  2.1  --   --   --    GFR: Estimated Creatinine Clearance: 108.3 mL/min (A) (by C-G formula based on SCr of 0.55 mg/dL (L)). Liver Function Tests: No results for input(s): AST, ALT, ALKPHOS, BILITOT, PROT, ALBUMIN in the last 168 hours. No results for input(s): LIPASE, AMYLASE in the last 168 hours. No results for input(s): AMMONIA in the last 168 hours. Coagulation Profile: No results for input(s): INR, PROTIME in the last 168 hours. Cardiac  Enzymes: No results for input(s): CKTOTAL, CKMB, CKMBINDEX, TROPONINI in the last 168 hours. BNP (last 3 results) No results for input(s): PROBNP in the last 8760 hours. HbA1C: No results for input(s): HGBA1C in the last 72 hours. CBG: Recent Labs  Lab 05/28/20 1156 05/28/20 1638 05/28/20 1938 05/29/20 0747 05/29/20 1150  GLUCAP 234* 250* 153* 129* 208*   Lipid Profile: No results for input(s): CHOL, HDL, LDLCALC, TRIG, CHOLHDL, LDLDIRECT in the last 72 hours. Thyroid Function Tests: No results for input(s): TSH, T4TOTAL, FREET4, T3FREE, THYROIDAB in the last 72  hours. Anemia Panel: No results for input(s): VITAMINB12, FOLATE, FERRITIN, TIBC, IRON, RETICCTPCT in the last 72 hours.    Radiology Studies: I have reviewed all of the imaging during this hospital visit personally     Scheduled Meds: . (feeding supplement) PROSource Plus  30 mL Oral Daily  . vitamin C  500 mg Oral Daily  . feeding supplement  1 Container Oral BID BM  . furosemide  40 mg Intravenous Daily  . guaiFENesin  1,200 mg Oral BID  . insulin aspart  0-20 Units Subcutaneous TID WC  . insulin aspart  0-5 Units Subcutaneous QHS  . Ipratropium-Albuterol  1 puff Inhalation TID  . linagliptin  5 mg Oral Daily  . magic mouthwash w/lidocaine  10 mL Oral QID  . mouth rinse  15 mL Mouth Rinse BID  . montelukast  10 mg Oral QHS  . multivitamin with minerals  1 tablet Oral Daily  . nystatin  5 mL Oral QID  . pantoprazole  40 mg Oral Daily  . predniSONE  40 mg Oral Q breakfast  . QUEtiapine  25 mg Oral QHS  . rivaroxaban  20 mg Oral Q supper  . senna-docusate  2 tablet Oral BID  . sertraline  100 mg Oral Daily  . zinc sulfate  220 mg Oral Daily   Continuous Infusions: . sodium chloride    . ceFEPime (MAXIPIME) IV 2 g (05/29/20 0547)     LOS: 35 days        Gaetana Kawahara Gerome Apley, MD

## 2020-05-29 NOTE — Progress Notes (Signed)
PT Cancellation Note  Patient Details Name: Clayton [name] MRN: [id number] DOB: [birth year]   Cancelled Treatment:    Reason Eval/Treat Not Completed: Other (comment) Pt reports "having a bad day," and not feeling up to therapy.  Tried to encourage and educate on importance of activity to improve strength and resp status.  Even encouraged just trying to sit EOB, but pt declined.  Will f/u as able.  Abran Richard, PT Acute Rehab Services Pager [phone] [facility] 05/29/2020, 1:02 PM

## 2020-05-30 ENCOUNTER — Other Ambulatory Visit (HOSPITAL_COMMUNITY): Payer: Self-pay

## 2020-05-30 ENCOUNTER — Inpatient Hospital Stay
Admission: AD | Admit: 2020-05-30 | Discharge: 2020-06-19 | Disposition: A | Discharge status: Rehabilitation Facility | Payer: Self-pay | Source: Other Acute Inpatient Hospital | Attending: Internal Medicine | Admitting: Internal Medicine

## 2020-05-30 DIAGNOSIS — J984 Other disorders of lung: Secondary | ICD-10-CM | POA: Diagnosis not present

## 2020-05-30 DIAGNOSIS — U071 COVID-19: Secondary | ICD-10-CM | POA: Diagnosis not present

## 2020-05-30 DIAGNOSIS — L89891 Pressure ulcer of other site, stage 1: Secondary | ICD-10-CM | POA: Diagnosis not present

## 2020-05-30 DIAGNOSIS — E669 Obesity, unspecified: Secondary | ICD-10-CM | POA: Diagnosis not present

## 2020-05-30 DIAGNOSIS — F418 Other specified anxiety disorders: Secondary | ICD-10-CM | POA: Diagnosis not present

## 2020-05-30 DIAGNOSIS — J1282 Pneumonia due to coronavirus disease 2019: Secondary | ICD-10-CM | POA: Diagnosis not present

## 2020-05-30 DIAGNOSIS — K567 Ileus, unspecified: Secondary | ICD-10-CM

## 2020-05-30 DIAGNOSIS — J189 Pneumonia, unspecified organism: Secondary | ICD-10-CM | POA: Diagnosis not present

## 2020-05-30 DIAGNOSIS — G4733 Obstructive sleep apnea (adult) (pediatric): Secondary | ICD-10-CM | POA: Diagnosis not present

## 2020-05-30 DIAGNOSIS — R0602 Shortness of breath: Secondary | ICD-10-CM

## 2020-05-30 DIAGNOSIS — E873 Alkalosis: Secondary | ICD-10-CM | POA: Diagnosis not present

## 2020-05-30 DIAGNOSIS — G8929 Other chronic pain: Secondary | ICD-10-CM | POA: Diagnosis not present

## 2020-05-30 DIAGNOSIS — R0603 Acute respiratory distress: Secondary | ICD-10-CM

## 2020-05-30 DIAGNOSIS — I5033 Acute on chronic diastolic (congestive) heart failure: Secondary | ICD-10-CM | POA: Diagnosis not present

## 2020-05-30 DIAGNOSIS — R1310 Dysphagia, unspecified: Secondary | ICD-10-CM | POA: Diagnosis not present

## 2020-05-30 DIAGNOSIS — E876 Hypokalemia: Secondary | ICD-10-CM | POA: Diagnosis not present

## 2020-05-30 DIAGNOSIS — L89621 Pressure ulcer of left heel, stage 1: Secondary | ICD-10-CM | POA: Diagnosis not present

## 2020-05-30 DIAGNOSIS — E119 Type 2 diabetes mellitus without complications: Secondary | ICD-10-CM | POA: Diagnosis not present

## 2020-05-30 DIAGNOSIS — J9600 Acute respiratory failure, unspecified whether with hypoxia or hypercapnia: Secondary | ICD-10-CM | POA: Diagnosis not present

## 2020-05-30 DIAGNOSIS — I82403 Acute embolism and thrombosis of unspecified deep veins of lower extremity, bilateral: Secondary | ICD-10-CM | POA: Diagnosis not present

## 2020-05-30 DIAGNOSIS — J9601 Acute respiratory failure with hypoxia: Secondary | ICD-10-CM | POA: Diagnosis not present

## 2020-05-30 DIAGNOSIS — E46 Unspecified protein-calorie malnutrition: Secondary | ICD-10-CM | POA: Diagnosis not present

## 2020-05-30 DIAGNOSIS — R531 Weakness: Secondary | ICD-10-CM | POA: Diagnosis not present

## 2020-05-30 LAB — GLUCOSE, CAPILLARY
Glucose-Capillary: 123 mg/dL — ABNORMAL HIGH (ref 70–99)
Glucose-Capillary: 219 mg/dL — ABNORMAL HIGH (ref 70–99)

## 2020-05-30 MED ORDER — FUROSEMIDE 20 MG PO TABS: 20.0000 mg | ORAL_TABLET | Freq: Every day | ORAL | Status: DC

## 2020-05-30 MED ORDER — MAGIC MOUTHWASH W/LIDOCAINE: 10.0000 mL | Freq: Four times a day (QID) | ORAL | 0 refills | Status: DC

## 2020-05-30 MED ORDER — ADULT MULTIVITAMIN W/MINERALS CH: 1.0000 | ORAL_TABLET | Freq: Every day | ORAL | 0 refills | Status: AC

## 2020-05-30 MED ORDER — IPRATROPIUM-ALBUTEROL 20-100 MCG/ACT IN AERS: 1.0000 | INHALATION_SPRAY | Freq: Three times a day (TID) | RESPIRATORY_TRACT | 0 refills | Status: DC

## 2020-05-30 MED ORDER — RIVAROXABAN 20 MG PO TABS
20.0000 mg | ORAL_TABLET | Freq: Every day | ORAL | 0 refills | Status: DC
Start: 2020-05-30 — End: 2020-07-14

## 2020-05-30 MED ORDER — PREDNISONE 10 MG PO TABS
ORAL_TABLET | ORAL | 0 refills | Status: DC
Start: 2020-05-30 — End: 2020-10-19

## 2020-05-30 MED ORDER — PREDNISONE 20 MG PO TABS: 20.0000 mg | ORAL_TABLET | Freq: Every day | ORAL | Status: DC

## 2020-05-30 MED ORDER — SODIUM CHLORIDE 0.9 % IV SOLN: 2.0000 g | Freq: Three times a day (TID) | INTRAVENOUS | 0 refills | Status: AC

## 2020-05-30 MED ORDER — POTASSIUM CHLORIDE CRYS ER 10 MEQ PO TBCR: 10.0000 meq | EXTENDED_RELEASE_TABLET | Freq: Every day | ORAL | 0 refills | Status: DC

## 2020-05-30 MED ORDER — ALBUTEROL SULFATE HFA 108 (90 BASE) MCG/ACT IN AERS: 2.0000 | INHALATION_SPRAY | RESPIRATORY_TRACT | 0 refills | Status: DC | PRN

## 2020-05-30 MED ORDER — POTASSIUM CHLORIDE CRYS ER 10 MEQ PO TBCR: 10.0000 meq | EXTENDED_RELEASE_TABLET | Freq: Every day | ORAL | Status: DC

## 2020-05-30 MED ORDER — SALINE SPRAY 0.65 % NA SOLN: 1.0000 | NASAL | 0 refills | Status: DC | PRN

## 2020-05-30 MED ORDER — PANTOPRAZOLE SODIUM 40 MG PO TBEC: 40.0000 mg | DELAYED_RELEASE_TABLET | Freq: Every day | ORAL | 0 refills | Status: DC

## 2020-05-30 MED ORDER — ACETAMINOPHEN 325 MG PO TABS: 650.0000 mg | ORAL_TABLET | Freq: Four times a day (QID) | ORAL | Status: DC | PRN

## 2020-05-30 MED ORDER — FUROSEMIDE 20 MG PO TABS: 20.0000 mg | ORAL_TABLET | Freq: Every day | ORAL | 0 refills | Status: DC

## 2020-05-30 MED ORDER — PROSOURCE PLUS PO LIQD: 30.0000 mL | Freq: Every day | ORAL | 0 refills | Status: AC

## 2020-05-30 MED ORDER — LORAZEPAM 1 MG PO TABS
1.0000 mg | ORAL_TABLET | ORAL | Status: DC | PRN
  Administered 2020-05-30: 1 mg via ORAL
  Filled 2020-05-30: qty 1

## 2020-05-30 MED ORDER — LORAZEPAM 1 MG PO TABS: 1.0000 mg | ORAL_TABLET | ORAL | 0 refills | Status: DC | PRN

## 2020-05-30 MED ORDER — GUAIFENESIN-DM 100-10 MG/5ML PO SYRP: 10.0000 mL | ORAL_SOLUTION | ORAL | 0 refills | Status: DC | PRN

## 2020-05-30 NOTE — TOC Transition Note (Signed)
Transition of Care [facility]) - CM/SW Discharge Note   Patient Details  Name: Clayton [name] MRN: [id number] Date of Birth: [birth year]  Transition of Care [facility]) CM/SW Contact:  Ross Ludwig, LCSW Phone Number: 05/30/2020, 1:25 PM   Clinical Narrative:     CSW was informed that patient's insurance company has approved him to go to [facility].  CSW updated physician that patient can transfer if he is medically ready for discharge.  CSW was informed that patient will be going to room 5E26 at [facility].  Dr. Merton Border will be the admitting physician, report to be called to [phone], they can accept him at [facility] notified bedside nurse, that [place] will have to be set up for transport to [facility].  Patient to be d/c'ed today to [facility].  Patient and family agreeable to plans will transport via [place] RN to call report.      Final next level of care: Long Term Acute Care (LTAC) Barriers to Discharge: Barriers Resolved   Patient Goals and CMS Choice Patient states their goals for this hospitalization and ongoing recovery are:: To go to [facility] and then eventually return back home. CMS Medicare.gov Compare Post Acute Care list provided to:: Patient Choice offered to / list presented to : Patient, Spouse  Discharge Placement              Patient chooses bed at: Other - please specify in the comment section below: ([facility]) Patient to be transferred to facility by: [place] Name of family member notified: Patient's wife Lavella Lemons [phone] Patient and family notified of of transfer: 05/30/20  Discharge Plan and Services   Discharge Planning Services: CM Consult              DME Agency: NA       HH Arranged: NA          Social Determinants of Health (SDOH) Interventions     Readmission Risk Interventions No flowsheet data found.

## 2020-05-30 NOTE — Progress Notes (Signed)
Report called to Rosemarie Ax, RN at [facility]. Report given to [facility]. Pt given Ativan. Pt prepared for transport. SRP, RN

## 2020-05-30 NOTE — Progress Notes (Signed)
Nutrition Follow-up  DOCUMENTATION CODES:   Non-severe (moderate) malnutrition in context of acute illness/injury  INTERVENTION:  - will d/c Boost Breeze and Prosource.  - continue Magic Cup BID.   NUTRITION DIAGNOSIS:   Moderate Malnutrition related to acute illness, catabolic illness (ZOXWR-60 infection) as evidenced by mild fat depletion, mild muscle depletion, moderate fat depletion, moderate muscle depletion. -ongoing  GOAL:   Patient will meet greater than or equal to 90% of their needs -unmet on average   MONITOR:   PO intake, Supplement acceptance, Labs, Weight trends  ASSESSMENT:   64 y.o. male with medical history of asthma, obesity, sleep apnea, and type 2 DM. He was admitted with severe shortness of breath, cough, and fevers of 102-104 at home. His symptoms began on 9/13. His wife and mother-in-law also presented at the same time as him for similar symptoms. He is not vaccinated against COVID-19. He was found to be COVID positive in the ED.  Documented intakes since follow-up assessment on 10/19 were 0% of breakfast and 20% of lunch on 10/22; 100% of breakfast and 80% of dinner yesterday; 0% of breakfast this AM.  Patient currently laying in bed with no family/visitors present. He reports being very short of breath and is taking short, quick breaths/hyperventalating-type breathing. Encouraged patient to take slow, deep breaths in his nose and out his mouth. He does not want HOB adjusted and would like his RN to be sent in to assist with breathing difficulty.  Lunch tray on bedside table and he had consumed ~25-50% of the meal.   Weight had been stable 10/10-10/19 and now has been slowly trending up.   Discharge order and discharge summary entered a short time ago for discharge to [facility].     Labs reviewed; CBGs: 123 and 219 mg/dl, Cl: 95 mmol/l, craetinine: 0.55 mg/dl, Ca: 8.6 mg/dl. Medications reviewed; 20 mg oral lasix/day, sliding scale novolog, 1 tablet  multivitamin with minerals/day, 5 ml mycostatin QID, 40 mg oral protonix/day, 10 mEq Klor-Con/day, 20 mg deltasone/day, 2 tablets senokot BID.    Diet Order:   Diet Order            Diet heart healthy/carb modified Room service appropriate? Yes; Fluid consistency: Thin  Diet effective now                 EDUCATION NEEDS:   No education needs have been identified at this time  Skin:  Skin Assessment: Reviewed RN Assessment  Last BM:  10/25  Height:   Ht Readings from Last 1 Encounters:  04/24/20 5\' 9"  (1.753 m)    Weight:   Wt Readings from Last 1 Encounters:  05/26/20 94 kg     Estimated Nutritional Needs:  Kcal:  2200-2400 kcal Protein:  110-120 grams Fluid:  >/= 2.2 L/day      Jarome Matin, MS, RD, LDN, CNSC Inpatient Clinical Dietitian RD pager # available in [facility]  After hours/weekend pager # available in [facility]

## 2020-05-30 NOTE — Progress Notes (Signed)
Inpatient Diabetes Program Recommendations  AACE/ADA: New Consensus Statement on Inpatient Glycemic Control (2015)  Target Ranges:  Prepandial:   less than 140 mg/dL      Peak postprandial:   less than 180 mg/dL (1-2 hours)      Critically ill patients:  140 - 180 mg/dL   Lab Results  Component Value Date   GLUCAP 123 (H) 05/30/2020   HGBA1C 8.4 (H) 04/25/2020    Review of Glycemic Control Results for Clayton [name], Clayton [name] (MRN [id number]) as of 05/30/2020 11:56  Ref. Range 05/29/2020 07:47 05/29/2020 11:50 05/29/2020 16:31 05/29/2020 21:57 05/30/2020 07:57  Glucose-Capillary Latest Ref Range: 70 - 99 mg/dL 129 (H) 208 (H) 318 (H) 146 (H) 123 (H)   Diabetes history: DM2 Outpatient Diabetes medications: glipizide 750 mg bid, metformin 750 mg bid Current orders for Inpatient glycemic control: Novolog resistant correction tid + hs 0-5 units + Prednisone 40 mg qd  Inpatient Diabetes Program Recommendations:   While on steroids and oral medications on hold consider: -Novolog 4 units tid meal coverage if eats 50% meals -Decrease Novolog correction to moderate 0-15 units tid + hs 0-5 units Secure chat sent to Dr. Cathlean Sauer.  Thank you, Nani Gasser. Claudius Mich, RN, MSN, CDE  Diabetes Coordinator Inpatient Glycemic Control Team Team Pager [phone] (8am-5pm) 05/30/2020 12:05 PM

## 2020-05-30 NOTE — Progress Notes (Signed)
Pt currently on 10 LPM HFNC which is around 60% FIO2. No distress noted at this time.

## 2020-05-30 NOTE — Discharge Summary (Addendum)
Physician Discharge Summary  Clayton [name] SEG:[id number] DOB: 10-01-1955 DOA: 04/24/2020  PCP: Kristen Loader, FNP  Admit date: 04/24/2020 Discharge date: 05/30/2020  Admitted From: Home  Disposition:  [facility]  Recommendations for Outpatient Follow-up and new medication changes:  1. Follow up with [name] FNP 2 weeks after discharge from [facility] 2. Continue with cefepime until 06/02/20.  3. Continue diuresis as tolerated with furosemide po.  4. Slow taper of steroids as instructed.  5. Hold on glipizide and continue with metformin.  6.  Please update daily his wife.   I spoke over the phone with the patient's wife about patient's  condition, plan of care, prognosis and all questions were addressed.  Home Health: na   Equipment/Devices: na    Discharge Condition: stable  CODE STATUS: full  Diet recommendation: heart healthy   Brief/Interim Summary: Clayton [name] was admitted to the hospital with a working diagnosis of acute hypoxic respiratory failure due to SARS COVID-19 viral pneumonia. Prolonged hospitalization, now off isolation.  64 year old male with past medical history of asthma, type II diabetes mellitus, obstructive sleep apnea and obesity. Patient developed severe dyspnea at home, associated with fever. Patient called EMS, his oximetry was 72% and he was placed on supplemental oxygen and transported to the hospital. Positive sick contacts at home. On his initial physical examination blood pressure 152/81, heart rate 85, respiratory rate 24-33, oxygen saturation 91% on supplemental oxygen, no wheezing or rales, heart S1-S2, present and rhythmic, soft abdomen, no lower extremity edema  Chest radiograph with bilateral all 4 quadrants interstitial infiltrates.  Patient received medical therapy with high-dose systemic corticosteroids, intravenous remdesivir and 14-day course of baricitinib.  Patient had high oxygen requirements that slowly have been improving. He  required diuresis for noncardiogenic pulmonary edema.  Resumed diuresis on 10/21 for worsening hypoxemia and recurrent pulmonary edema.Persistent leukocytosis with worsening pulmonary infiltrates, added cefepime IV.  Continue to have high oxygen requirements, high flow nasal cannula and non rebreather mask. Very weak and deconditioned.  He dose have oxygen desaturation at the time of transition from the bed to chair, consistent with lung de-recruitment.  10/24 Chest film with hypoinflation positive diffuse severe bilateral interstitial infiltrates.  Now patient is transferred to [facility] to continue care and pulmonary rehab.   1.  Acute hypoxic respiratory failure due to SARS COVID-19 viral pneumonia, complicated with viral sepsis, present on admission, complicated with bacterial pneumonia, co-infection.  Patient had a prolonged hospitalization due to severe respiratory failure, he had a very high oxygen requirements, did not tolerate noninvasive mechanical ventilation.  He had aggressive medical therapy with systemic corticosteroids, baricitinib and remdesivir.  He was treated with antitussive agents, bronchodilators and airway clearing techniques. He developed noncardiogenic pulmonary edema and required diuresis with furosemide.  Due to persistent respiratory failure, bilateral pulmonary infiltrates and leukocytosis patient has been placed on cefepime with good toleration. His white cell count has been trending down, today at 10.5, and his oxygen requirements have been improved, today only on 10 L per high flow nasal cannula with oxygen saturation 94%.  He does have lung derecruitment when moving, triggering severe hypoxemia that recovers with supplemental oxygen.  He needs aggressive pulmonary rehab to expedite his recovery. Plan to complete 8 days of cefepime, continue furosemide as tolerated.  2.  Uncontrolled type 2 diabetes mellitus, hemoglobin A1c 8.4, episodic hypoglycemia.  He  required insulin therapy, sliding scale and basal.  He had developed episode of hypoglycemia and currently only getting insulin sliding scale. At discharge  will resume metformin, hold on glipizide for now.   3.  Hypokalemia/metabolic alkalosis.  Electrolyte disturbances likely due to diuresis, potassium has been corrected potassium chloride, kidney function remained stable.  Continue close follow-up kidney function electrolytes, continue furosemide.  At discharge sodium 139, potassium 4.1, chloride 95, bicarb 34, glucose 123, BUN 16, creatinine 0.55.  4.  Acute bilateral extremity deep vein thrombosis.  Likely related to COVID-19 viral infection, continue rivaroxaban for antithrombotic therapy.  5.  Acute on chronic diastolic heart failure.  Patient has required diuresis with furosemide, at the time of his discharge his fluid balance is -23,005 mL. Continue to keep negative fluid balance with furosemide.  Continue blood pressure control.  His echocardiogram showed a preserved LV systolic function 65 to 19%.  6.  Depression/anxiety/back pain.  Patient had severe anxiety, related to dyspnea, continuously lorazepam and sertraline.  For pain control he is on hydrocodone.  7.  Hypotension.  Now it has resolved, continue diuresis as tolerated. Continue to hold on losartan and hctz.   8.  Resolved oral ulcers/ oral thrush/ moderate calorie protein malnutrition.  Patient received empiric therapy with valacyclovir, no confirmation of herpes disease. Patient received nystatin with good toleration.   Continue with nutritional supplements.   9. Obesity class 1. BMI is 30,5  Discharge Diagnoses:  Active Problems:   Acute hypoxemic respiratory failure due to COVID-19 [facility])   Hypokalemia   Hyperglycemia   Uncontrolled type 2 diabetes mellitus with hyperglycemia (HCC)   Malnutrition of moderate degree    Discharge Instructions   Allergies as of 05/30/2020   No Known Allergies     Medication  List    STOP taking these medications   doxycycline 100 MG capsule Commonly known as: VIBRAMYCIN   glipiZIDE 5 MG 24 hr tablet Commonly known as: GLUCOTROL XL   losartan-hydrochlorothiazide 50-12.5 MG tablet Commonly known as: HYZAAR   meloxicam 15 MG tablet Commonly known as: MOBIC   methocarbamol 500 MG tablet Commonly known as: ROBAXIN   montelukast 10 MG tablet Commonly known as: SINGULAIR   traMADol 50 MG tablet Commonly known as: ULTRAM     TAKE these medications   (feeding supplement) PROSource Plus liquid Take 30 mLs by mouth daily.   acetaminophen 325 MG tablet Commonly known as: TYLENOL Take 2 tablets (650 mg total) by mouth every 6 (six) hours as needed for fever.   albuterol 108 (90 Base) MCG/ACT inhaler Commonly known as: VENTOLIN HFA Inhale 2 puffs into the lungs every 4 (four) hours as needed for wheezing or shortness of breath.   atorvastatin 40 MG tablet Commonly known as: LIPITOR Take 40 mg by mouth daily.   ceFEPIme 2 g in sodium chloride 0.9 % 100 mL Inject 2 g into the vein every 8 (eight) hours for 3 days.   furosemide 20 MG tablet Commonly known as: LASIX Take 1 tablet (20 mg total) by mouth daily. Start taking on: May 31, 2020   guaiFENesin-dextromethorphan 100-10 MG/5ML syrup Commonly known as: ROBITUSSIN DM Take 10 mLs by mouth every 4 (four) hours as needed for cough.   Ipratropium-Albuterol 20-100 MCG/ACT Aers respimat Commonly known as: COMBIVENT Inhale 1 puff into the lungs 3 (three) times daily.   LORazepam 1 MG tablet Commonly known as: ATIVAN Take 1 tablet (1 mg total) by mouth every 4 (four) hours as needed for anxiety.   magic mouthwash w/lidocaine Soln Take 10 mLs by mouth 4 (four) times daily.   metFORMIN 750 MG 24 hr tablet  Commonly known as: GLUCOPHAGE-XR Take 750 mg by mouth in the morning and at bedtime.   multivitamin with minerals Tabs tablet Take 1 tablet by mouth daily. Start taking on: May 31, 2020   pantoprazole 40 MG tablet Commonly known as: PROTONIX Take 1 tablet (40 mg total) by mouth daily. Start taking on: May 31, 2020   potassium chloride 10 MEQ tablet Commonly known as: KLOR-CON Take 1 tablet (10 mEq total) by mouth daily. Start taking on: May 31, 2020   predniSONE 10 MG tablet Commonly known as: DELTASONE Take 2 tablets daily for five days, then 1 tablet daily for five days, then half tablet daily for five days, then stop.   rivaroxaban 20 MG Tabs tablet Commonly known as: XARELTO Take 1 tablet (20 mg total) by mouth daily with supper.   sertraline 100 MG tablet Commonly known as: ZOLOFT Take 100 mg by mouth daily.   sodium chloride 0.65 % Soln nasal spray Commonly known as: OCEAN Place 1 spray into both nostrils as needed for congestion (nose irritation, give 1st for congestion).            Discharge Care Instructions  (From admission, onward)         Start     Ordered   05/30/20 0000  Change dressing on IV access line weekly and PRN  (Home infusion instructions - [facility] )        05/30/20 1308          No Known Allergies  Consultations:  Pulmonary    Procedures/Studies: DG Chest 1 View  Result Date: 05/28/2020 CLINICAL DATA:  Shortness of breath, cough EXAM: CHEST  1 VIEW COMPARISON:  Chest x-ray dated 05/25/2020. FINDINGS: Diffuse bilateral airspace opacities, stable to slightly worsened compared to most recent chest x-rays of 05/25/2020 and 05/22/2020. No pneumothorax is seen. IMPRESSION: Bilateral multifocal pneumonia, stable to slightly worsened compared to most recent chest x-rays of 05/25/2020 and 05/22/2020. Electronically Signed   By: Franki Cabot M.D.   On: 05/28/2020 06:25   DG Chest 1 View  Result Date: 05/25/2020 CLINICAL DATA:  Dyspnea, asthma EXAM: CHEST  1 VIEW COMPARISON:  05/22/2020 FINDINGS: Lung volumes are extremely small, however, pulmonary insufflation remains stable since prior  examination. Superimposed extensive airspace infiltrate has progressed slightly in the interval since prior examination. No pneumothorax or pleural effusion. Cardiac size within normal limits. No acute bone abnormality. IMPRESSION: Progressive pulmonary infiltrates. Stable marked pulmonary hypoinflation. Electronically Signed   By: Fidela Salisbury MD   On: 05/25/2020 17:05   DG Chest Port 1 View  Result Date: 05/22/2020 CLINICAL DATA:  History of COVID-19 infection. Shortness of breath and cough. EXAM: PORTABLE CHEST 1 VIEW COMPARISON:  05/17/2020 FINDINGS: Again noted are low lung volumes with diffuse parenchymal lung densities particularly along the peripheries. Lung opacities have not significantly changed. Mild elevation of the right hemidiaphragm compared to the left. Cardiomediastinal silhouette is grossly stable but poorly characterized due to the low lung volumes. IMPRESSION: No significant change in the bilateral lung opacities. Findings remain compatible with pneumonia. Low lung volumes. Electronically Signed   By: Markus Daft M.D.   On: 05/22/2020 10:25   DG CHEST PORT 1 VIEW  Result Date: 05/17/2020 CLINICAL DATA:  COVID-19. EXAM: PORTABLE CHEST 1 VIEW COMPARISON:  May 12, 2020. FINDINGS: Stable cardiomediastinal silhouette. Hypoinflation of the lungs is noted. Stable diffuse airspace opacities are noted concerning for multifocal pneumonia. No pneumothorax or significant pleural effusion is noted. Bony  thorax is unremarkable. IMPRESSION: Stable diffuse airspace opacities are noted concerning for multifocal pneumonia. Electronically Signed   By: Marijo Conception M.D.   On: 05/17/2020 08:15   DG CHEST PORT 1 VIEW  Result Date: 05/12/2020 CLINICAL DATA:  COVID-19 EXAM: PORTABLE CHEST 1 VIEW COMPARISON:  05/02/2020 FINDINGS: 0927 hours. Low lung volumes. Cardiopericardial silhouette is at upper limits of normal for size. Diffuse bilateral airspace disease is similar to prior. No substantial  pleural effusion. The visualized bony structures of the thorax show no acute abnormality. Telemetry leads overlie the chest. IMPRESSION: Low volume film with persistent diffuse bilateral airspace disease. Electronically Signed   By: Misty Stanley M.D.   On: 05/12/2020 09:41   DG CHEST PORT 1 VIEW  Result Date: 05/02/2020 CLINICAL DATA:  Shortness of breath.  Sepsis.  COVID positive. EXAM: PORTABLE CHEST 1 VIEW COMPARISON:  Two thousand twenty[birth year]one. FINDINGS: Heart size stable. Diffuse severe bilateral pulmonary infiltrates/edema again noted. No pleural effusion or pneumothorax. Degenerative change thoracic spine. IMPRESSION: Diffuse severe bilateral pulmonary infiltrates/edema again noted. Age. Electronically Signed   By: Marcello Moores  Register   On: 05/02/2020 09:16   ECHOCARDIOGRAM LIMITED  Result Date: 05/02/2020    ECHOCARDIOGRAM LIMITED REPORT   Patient Name:   VELMA HANNA Date of Exam: 05/02/2020 Medical Rec #:  [id number]      Height:       69.0 in Accession #:    [id number]     Weight:       239.0 lb Date of Birth:  August 26, 1955       BSA:          2.228 m Patient Age:    64 years       BP:           163/60 mmHg Patient Gender: M              HR:           108 bpm. Exam Location:  Inpatient Procedure: Limited Echo, Limited Color Doppler, Cardiac Doppler and Intracardiac            Opacification Agent Indications:    Congestive Heart Failure 428.0 / I50.9  History:        Patient has no prior history of Echocardiogram examinations.                 Signs/Symptoms:Shortness of Breath; Risk Factors:Sleep Apnea and                 Diabetes. COVID 19 pneumonia. HFpEF.  Sonographer:    Darlina Sicilian RDCS Referring Phys: [id number] [facility] M PATEL  Sonographer Comments: Technically difficult study due to poor echo windows. Image acquisition challenging due to respiratory motion. IMPRESSIONS  1. Limited study with poor echo windows, Definity contrast given  2. Left ventricular ejection fraction, by estimation, is 65 to  70%. The left ventricle has normal function. The left ventricle has no regional wall motion abnormalities. There is mild left ventricular hypertrophy. Left ventricular diastolic parameters are consistent with Grade I diastolic dysfunction (impaired relaxation).  3. The aortic valve is tricuspid. Aortic valve regurgitation is not visualized. Mild aortic valve sclerosis is present, with no evidence of aortic valve stenosis. FINDINGS  Left Ventricle: Left ventricular ejection fraction, by estimation, is 65 to 70%. The left ventricle has normal function. The left ventricle has no regional wall motion abnormalities. Definity contrast agent was given IV to delineate the left ventricular  endocardial borders. There is mild left ventricular  hypertrophy. Left ventricular diastolic parameters are consistent with Grade I diastolic dysfunction (impaired relaxation). Indeterminate filling pressures. Aortic Valve: The aortic valve is tricuspid. Aortic valve regurgitation is not visualized. Mild aortic valve sclerosis is present, with no evidence of aortic valve stenosis. Pulmonic Valve: The pulmonic valve was normal in structure. Pulmonic valve regurgitation is not visualized.  Diastology LV e' medial:    7.62 cm/s LV E/e' medial:  9.6 LV e' lateral:   6.09 cm/s LV E/e' lateral: 12.0  AORTIC VALVE LVOT Vmax:   148.00 cm/s LVOT Vmean:  104.000 cm/s LVOT VTI:    0.254 m MITRAL VALVE MV Area (PHT): 2.74 cm    SHUNTS MV Decel Time: 277 msec    Systemic VTI: 0.25 m MV E velocity: 72.80 cm/s MV A velocity: 44.60 cm/s MV E/A ratio:  1.63 Lyman Bishop MD Electronically signed by Lyman Bishop MD Signature Date/Time: 05/02/2020/12:31:57 PM    Final        Subjective: Patient is feeling better, continue to be very weak and deconditioned, no nausea or vomiting, positive dyspnea with movement.   Discharge Exam: Vitals:   05/30/20 1018 05/30/20 1202  BP:    Pulse: 88   Resp:    Temp:    SpO2: (!) 88% 94%   Vitals:    05/30/20 0433 05/30/20 0850 05/30/20 1018 05/30/20 1202  BP:      Pulse: 77  88   Resp: 20     Temp:      TempSrc:      SpO2: 95% 100% (!) 88% 94%  Weight:      Height:        General: deconditioned, positive dyspnea with movement.,  Neurology: Awake and alert, non focal  E ENT: mild pallor, no icterus, oral mucosa moist Cardiovascular: No JVD. S1-S2 present, rhythmic, no gallops, rubs, or murmurs. No lower extremity edema. Pulmonary: positive breath sounds bilaterally scattered rhonchi and  rales. Gastrointestinal. Abdomen soft and non tender.  Skin. No rashes Musculoskeletal: no joint deformities   The results of significant diagnostics from this hospitalization (including imaging, microbiology, ancillary and laboratory) are listed below for reference.     Microbiology: No results found for this or any previous visit (from the past 240 hour(s)).   Labs: BNP (last 3 results) Recent Labs    05/08/20 0345  BNP 56.4   Basic Metabolic Panel: Recent Labs  Lab 05/24/20 0359 05/25/20 0420 05/26/20 0336 05/28/20 0448 05/29/20 0426  NA 136 135 135 134* 139  K 2.8* 3.1* 3.7 3.3* 4.1  CL 91* 93* 95* 91* 95*  CO2 33* 31 30 32 34*  GLUCOSE 151* 78 44* 111* 123*  BUN 23 18 8 16 16   CREATININE 0.61 0.55* 0.48* 0.57* 0.55*  CALCIUM 8.4* 8.4* 8.2* 8.4* 8.6*  MG  --  2.1  --   --   --    Liver Function Tests: No results for input(s): AST, ALT, ALKPHOS, BILITOT, PROT, ALBUMIN in the last 168 hours. No results for input(s): LIPASE, AMYLASE in the last 168 hours. No results for input(s): AMMONIA in the last 168 hours. CBC: Recent Labs  Lab 05/25/20 0420 05/26/20 0336 05/28/20 0448 05/29/20 0426  WBC 17.0* 21.3* 14.2* 10.5  NEUTROABS  --  18.1* 11.5* 7.8*  HGB 11.1* 11.0* 10.3* 10.4*  HCT 33.8* 34.6* 32.1* 33.1*  MCV 92.9 95.8 93.9 94.3  PLT 311 275 350 371   Cardiac Enzymes: No results for input(s): CKTOTAL, CKMB, CKMBINDEX, TROPONINI in the  last 168  hours. BNP: Invalid input(s): POCBNP CBG: Recent Labs  Lab 05/29/20 1150 05/29/20 1631 05/29/20 2157 05/30/20 0757 05/30/20 1210  GLUCAP 208* 318* 146* 123* 219*   D-Dimer No results for input(s): DDIMER in the last 72 hours. Hgb A1c No results for input(s): HGBA1C in the last 72 hours. Lipid Profile No results for input(s): CHOL, HDL, LDLCALC, TRIG, CHOLHDL, LDLDIRECT in the last 72 hours. Thyroid function studies No results for input(s): TSH, T4TOTAL, T3FREE, THYROIDAB in the last 72 hours.  Invalid input(s): FREET3 Anemia work up No results for input(s): VITAMINB12, FOLATE, FERRITIN, TIBC, IRON, RETICCTPCT in the last 72 hours. Urinalysis    Component Value Date/Time   COLORURINE YELLOW 12/22/2007 1120   APPEARANCEUR CLEAR 12/22/2007 1120   LABSPEC 1.011 12/22/2007 1120   PHURINE 6.0 12/22/2007 1120   GLUCOSEU 100 (A) 12/22/2007 1120   HGBUR MODERATE (A) 12/22/2007 1120   HGBUR negative 12/21/2007 0000   BILIRUBINUR NEGATIVE 12/22/2007 1120   KETONESUR NEGATIVE 12/22/2007 1120   PROTEINUR NEGATIVE 12/22/2007 1120   UROBILINOGEN 0.2 12/22/2007 1120   NITRITE NEGATIVE 12/22/2007 1120   LEUKOCYTESUR NEGATIVE 12/22/2007 1120   Sepsis Labs Invalid input(s): PROCALCITONIN,  WBC,  LACTICIDVEN Microbiology No results found for this or any previous visit (from the past 240 hour(s)).   Time coordinating discharge: 45 minutes  SIGNED:   Tawni Millers, MD  [facility] 05/30/2020, 12:35 PM

## 2020-05-30 NOTE — Progress Notes (Signed)
Inpatient Rehab Admissions Coordinator:   Continuing to follow from a distance.  Pt still requiring significant amounts of supplemental O2.    Shann Medal, PT, DPT Admissions Coordinator [phone] 05/30/20  10:56 AM

## 2020-05-31 LAB — CBC WITH DIFFERENTIAL/PLATELET
Abs Immature Granulocytes: 0.1 10*3/uL — ABNORMAL HIGH (ref 0.00–0.07)
Band Neutrophils: 1 %
Basophils Absolute: 0 10*3/uL (ref 0.0–0.1)
Basophils Relative: 0 %
Eosinophils Absolute: 0.1 10*3/uL (ref 0.0–0.5)
Eosinophils Relative: 1 %
HCT: 36.1 % — ABNORMAL LOW (ref 39.0–52.0)
Hemoglobin: 11.2 g/dL — ABNORMAL LOW (ref 13.0–17.0)
Lymphocytes Relative: 7 %
Lymphs Abs: 1 10*3/uL (ref 0.7–4.0)
MCH: 29.5 pg (ref 26.0–34.0)
MCHC: 31 g/dL (ref 30.0–36.0)
MCV: 95 fL (ref 80.0–100.0)
Monocytes Absolute: 0.8 10*3/uL (ref 0.1–1.0)
Monocytes Relative: 6 %
Myelocytes: 1 %
Neutro Abs: 11.6 10*3/uL — ABNORMAL HIGH (ref 1.7–7.7)
Neutrophils Relative %: 84 %
Platelets: 424 10*3/uL — ABNORMAL HIGH (ref 150–400)
RBC: 3.8 MIL/uL — ABNORMAL LOW (ref 4.22–5.81)
RDW: 14 % (ref 11.5–15.5)
WBC: 13.7 10*3/uL — ABNORMAL HIGH (ref 4.0–10.5)
nRBC: 0 % (ref 0.0–0.2)
nRBC: 1 /100 WBC — ABNORMAL HIGH

## 2020-05-31 LAB — COMPREHENSIVE METABOLIC PANEL
ALT: 38 U/L (ref 0–44)
AST: 31 U/L (ref 15–41)
Albumin: 2.2 g/dL — ABNORMAL LOW (ref 3.5–5.0)
Alkaline Phosphatase: 78 U/L (ref 38–126)
Anion gap: 12 (ref 5–15)
BUN: 16 mg/dL (ref 8–23)
CO2: 35 mmol/L — ABNORMAL HIGH (ref 22–32)
Calcium: 8.7 mg/dL — ABNORMAL LOW (ref 8.9–10.3)
Chloride: 90 mmol/L — ABNORMAL LOW (ref 98–111)
Creatinine, Ser: 0.72 mg/dL (ref 0.61–1.24)
GFR, Estimated: >60 mL/min (ref >60)
Glucose, Bld: 111 mg/dL — ABNORMAL HIGH (ref 70–99)
Potassium: 3.2 mmol/L — ABNORMAL LOW (ref 3.5–5.1)
Sodium: 137 mmol/L (ref 135–145)
Total Bilirubin: 0.5 mg/dL (ref 0.3–1.2)
Total Protein: 6.2 g/dL — ABNORMAL LOW (ref 6.5–8.1)

## 2020-05-31 LAB — PHOSPHORUS: Phosphorus: 3.3 mg/dL (ref 2.5–4.6)

## 2020-05-31 LAB — PROTIME-INR
INR: 1.3 — ABNORMAL HIGH (ref 0.8–1.2)
Prothrombin Time: 15.2 seconds (ref 11.4–15.2)

## 2020-05-31 LAB — T4, FREE: Free T4: 0.92 ng/dL (ref 0.61–1.12)

## 2020-05-31 LAB — MAGNESIUM: Magnesium: 2.2 mg/dL (ref 1.7–2.4)

## 2020-05-31 LAB — TSH: TSH: 4.077 u[IU]/mL (ref 0.350–4.500)

## 2020-06-01 LAB — URINALYSIS, ROUTINE W REFLEX MICROSCOPIC
Bilirubin Urine: NEGATIVE
Glucose, UA: NEGATIVE mg/dL
Hgb urine dipstick: NEGATIVE
Ketones, ur: NEGATIVE mg/dL
Leukocytes,Ua: NEGATIVE
Nitrite: NEGATIVE
Protein, ur: NEGATIVE mg/dL
Specific Gravity, Urine: 1.009 (ref 1.005–1.030)
pH: 6 (ref 5.0–8.0)

## 2020-06-01 LAB — BASIC METABOLIC PANEL
Anion gap: 10 (ref 5–15)
BUN: 15 mg/dL (ref 8–23)
CO2: 36 mmol/L — ABNORMAL HIGH (ref 22–32)
Calcium: 8.7 mg/dL — ABNORMAL LOW (ref 8.9–10.3)
Chloride: 90 mmol/L — ABNORMAL LOW (ref 98–111)
Creatinine, Ser: 0.59 mg/dL — ABNORMAL LOW (ref 0.61–1.24)
GFR, Estimated: >60 mL/min (ref >60)
Glucose, Bld: 113 mg/dL — ABNORMAL HIGH (ref 70–99)
Potassium: 4 mmol/L (ref 3.5–5.1)
Sodium: 136 mmol/L (ref 135–145)

## 2020-06-01 LAB — CBC
HCT: 34.7 % — ABNORMAL LOW (ref 39.0–52.0)
Hemoglobin: 10.9 g/dL — ABNORMAL LOW (ref 13.0–17.0)
MCH: 29.6 pg (ref 26.0–34.0)
MCHC: 31.4 g/dL (ref 30.0–36.0)
MCV: 94.3 fL (ref 80.0–100.0)
Platelets: 437 10*3/uL — ABNORMAL HIGH (ref 150–400)
RBC: 3.68 MIL/uL — ABNORMAL LOW (ref 4.22–5.81)
RDW: 14 % (ref 11.5–15.5)
WBC: 15.2 10*3/uL — ABNORMAL HIGH (ref 4.0–10.5)
nRBC: 0 % (ref 0.0–0.2)

## 2020-06-01 LAB — MAGNESIUM: Magnesium: 2.3 mg/dL (ref 1.7–2.4)

## 2020-06-01 LAB — PHOSPHORUS: Phosphorus: 2.8 mg/dL (ref 2.5–4.6)

## 2020-06-01 LAB — HEMOGLOBIN A1C
Hgb A1c MFr Bld: 8.6 % — ABNORMAL HIGH (ref 4.8–5.6)
Mean Plasma Glucose: 200 mg/dL

## 2020-06-02 LAB — URINE CULTURE: Culture: NO GROWTH

## 2020-06-03 ENCOUNTER — Other Ambulatory Visit (HOSPITAL_COMMUNITY): Payer: Self-pay

## 2020-06-04 LAB — URINALYSIS, ROUTINE W REFLEX MICROSCOPIC
Bilirubin Urine: NEGATIVE
Glucose, UA: NEGATIVE mg/dL
Hgb urine dipstick: NEGATIVE
Ketones, ur: NEGATIVE mg/dL
Leukocytes,Ua: NEGATIVE
Nitrite: NEGATIVE
Protein, ur: NEGATIVE mg/dL
Specific Gravity, Urine: 1.008 (ref 1.005–1.030)
pH: 5 (ref 5.0–8.0)

## 2020-06-04 LAB — CBC
HCT: 35.3 % — ABNORMAL LOW (ref 39.0–52.0)
Hemoglobin: 11.1 g/dL — ABNORMAL LOW (ref 13.0–17.0)
MCH: 29.3 pg (ref 26.0–34.0)
MCHC: 31.4 g/dL (ref 30.0–36.0)
MCV: 93.1 fL (ref 80.0–100.0)
Platelets: 448 10*3/uL — ABNORMAL HIGH (ref 150–400)
RBC: 3.79 MIL/uL — ABNORMAL LOW (ref 4.22–5.81)
RDW: 14.2 % (ref 11.5–15.5)
WBC: 16.4 10*3/uL — ABNORMAL HIGH (ref 4.0–10.5)
nRBC: 0 % (ref 0.0–0.2)

## 2020-06-04 LAB — BASIC METABOLIC PANEL
Anion gap: 10 (ref 5–15)
BUN: 16 mg/dL (ref 8–23)
CO2: 38 mmol/L — ABNORMAL HIGH (ref 22–32)
Calcium: 8.9 mg/dL (ref 8.9–10.3)
Chloride: 89 mmol/L — ABNORMAL LOW (ref 98–111)
Creatinine, Ser: 0.61 mg/dL (ref 0.61–1.24)
GFR, Estimated: >60 mL/min (ref >60)
Glucose, Bld: 140 mg/dL — ABNORMAL HIGH (ref 70–99)
Potassium: 3.5 mmol/L (ref 3.5–5.1)
Sodium: 137 mmol/L (ref 135–145)

## 2020-06-04 LAB — PHOSPHORUS: Phosphorus: 3.7 mg/dL (ref 2.5–4.6)

## 2020-06-04 LAB — MAGNESIUM: Magnesium: 2.2 mg/dL (ref 1.7–2.4)

## 2020-06-05 LAB — EXPECTORATED SPUTUM ASSESSMENT W GRAM STAIN, RFLX TO RESP C

## 2020-06-05 LAB — URINE CULTURE: Culture: NO GROWTH

## 2020-06-07 LAB — CULTURE, RESPIRATORY W GRAM STAIN: Culture: NORMAL

## 2020-06-08 LAB — CBC
HCT: 36.5 % — ABNORMAL LOW (ref 39.0–52.0)
Hemoglobin: 11.5 g/dL — ABNORMAL LOW (ref 13.0–17.0)
MCH: 29.5 pg (ref 26.0–34.0)
MCHC: 31.5 g/dL (ref 30.0–36.0)
MCV: 93.6 fL (ref 80.0–100.0)
Platelets: 415 10*3/uL — ABNORMAL HIGH (ref 150–400)
RBC: 3.9 MIL/uL — ABNORMAL LOW (ref 4.22–5.81)
RDW: 14.9 % (ref 11.5–15.5)
WBC: 15.6 10*3/uL — ABNORMAL HIGH (ref 4.0–10.5)
nRBC: 0 % (ref 0.0–0.2)

## 2020-06-08 LAB — BASIC METABOLIC PANEL
Anion gap: 11 (ref 5–15)
BUN: 15 mg/dL (ref 8–23)
CO2: 38 mmol/L — ABNORMAL HIGH (ref 22–32)
Calcium: 9.1 mg/dL (ref 8.9–10.3)
Chloride: 91 mmol/L — ABNORMAL LOW (ref 98–111)
Creatinine, Ser: 0.65 mg/dL (ref 0.61–1.24)
GFR, Estimated: >60 mL/min (ref >60)
Glucose, Bld: 103 mg/dL — ABNORMAL HIGH (ref 70–99)
Potassium: 3.5 mmol/L (ref 3.5–5.1)
Sodium: 140 mmol/L (ref 135–145)

## 2020-06-08 LAB — MAGNESIUM: Magnesium: 2.1 mg/dL (ref 1.7–2.4)

## 2020-06-12 LAB — BASIC METABOLIC PANEL
Anion gap: 8 (ref 5–15)
BUN: 14 mg/dL (ref 8–23)
CO2: 36 mmol/L — ABNORMAL HIGH (ref 22–32)
Calcium: 8.8 mg/dL — ABNORMAL LOW (ref 8.9–10.3)
Chloride: 97 mmol/L — ABNORMAL LOW (ref 98–111)
Creatinine, Ser: 0.62 mg/dL (ref 0.61–1.24)
GFR, Estimated: >60 mL/min (ref >60)
Glucose, Bld: 112 mg/dL — ABNORMAL HIGH (ref 70–99)
Potassium: 4 mmol/L (ref 3.5–5.1)
Sodium: 141 mmol/L (ref 135–145)

## 2020-06-12 LAB — CBC
HCT: 34.6 % — ABNORMAL LOW (ref 39.0–52.0)
Hemoglobin: 10.7 g/dL — ABNORMAL LOW (ref 13.0–17.0)
MCH: 29.5 pg (ref 26.0–34.0)
MCHC: 30.9 g/dL (ref 30.0–36.0)
MCV: 95.3 fL (ref 80.0–100.0)
Platelets: 328 10*3/uL (ref 150–400)
RBC: 3.63 MIL/uL — ABNORMAL LOW (ref 4.22–5.81)
RDW: 15.9 % — ABNORMAL HIGH (ref 11.5–15.5)
WBC: 10.5 10*3/uL (ref 4.0–10.5)
nRBC: 0 % (ref 0.0–0.2)

## 2020-06-12 LAB — MAGNESIUM: Magnesium: 2.1 mg/dL (ref 1.7–2.4)

## 2020-06-13 LAB — CBC
HCT: 32.9 % — ABNORMAL LOW (ref 39.0–52.0)
Hemoglobin: 10.4 g/dL — ABNORMAL LOW (ref 13.0–17.0)
MCH: 30.4 pg (ref 26.0–34.0)
MCHC: 31.6 g/dL (ref 30.0–36.0)
MCV: 96.2 fL (ref 80.0–100.0)
Platelets: 328 10*3/uL (ref 150–400)
RBC: 3.42 MIL/uL — ABNORMAL LOW (ref 4.22–5.81)
RDW: 16.4 % — ABNORMAL HIGH (ref 11.5–15.5)
WBC: 8.9 10*3/uL (ref 4.0–10.5)
nRBC: 0 % (ref 0.0–0.2)

## 2020-06-13 LAB — COMPREHENSIVE METABOLIC PANEL
ALT: 33 U/L (ref 0–44)
AST: 22 U/L (ref 15–41)
Albumin: 2.5 g/dL — ABNORMAL LOW (ref 3.5–5.0)
Alkaline Phosphatase: 56 U/L (ref 38–126)
Anion gap: 10 (ref 5–15)
BUN: 8 mg/dL (ref 8–23)
CO2: 33 mmol/L — ABNORMAL HIGH (ref 22–32)
Calcium: 8.8 mg/dL — ABNORMAL LOW (ref 8.9–10.3)
Chloride: 99 mmol/L (ref 98–111)
Creatinine, Ser: 0.51 mg/dL — ABNORMAL LOW (ref 0.61–1.24)
GFR, Estimated: >60 mL/min (ref >60)
Glucose, Bld: 100 mg/dL — ABNORMAL HIGH (ref 70–99)
Potassium: 3.8 mmol/L (ref 3.5–5.1)
Sodium: 142 mmol/L (ref 135–145)
Total Bilirubin: 0.6 mg/dL (ref 0.3–1.2)
Total Protein: 5.2 g/dL — ABNORMAL LOW (ref 6.5–8.1)

## 2020-06-13 LAB — AMMONIA: Ammonia: 22 umol/L (ref 9–35)

## 2020-06-15 ENCOUNTER — Other Ambulatory Visit (HOSPITAL_COMMUNITY): Payer: Self-pay

## 2020-06-16 LAB — CBC
HCT: 33.9 % — ABNORMAL LOW (ref 39.0–52.0)
Hemoglobin: 10.6 g/dL — ABNORMAL LOW (ref 13.0–17.0)
MCH: 30.3 pg (ref 26.0–34.0)
MCHC: 31.3 g/dL (ref 30.0–36.0)
MCV: 96.9 fL (ref 80.0–100.0)
Platelets: 298 10*3/uL (ref 150–400)
RBC: 3.5 MIL/uL — ABNORMAL LOW (ref 4.22–5.81)
RDW: 17.5 % — ABNORMAL HIGH (ref 11.5–15.5)
WBC: 10.1 10*3/uL (ref 4.0–10.5)
nRBC: 0 % (ref 0.0–0.2)

## 2020-06-16 LAB — BASIC METABOLIC PANEL
Anion gap: 9 (ref 5–15)
BUN: 12 mg/dL (ref 8–23)
CO2: 31 mmol/L (ref 22–32)
Calcium: 9.1 mg/dL (ref 8.9–10.3)
Chloride: 101 mmol/L (ref 98–111)
Creatinine, Ser: 0.56 mg/dL — ABNORMAL LOW (ref 0.61–1.24)
GFR, Estimated: >60 mL/min (ref >60)
Glucose, Bld: 97 mg/dL (ref 70–99)
Potassium: 4.3 mmol/L (ref 3.5–5.1)
Sodium: 141 mmol/L (ref 135–145)

## 2020-06-16 LAB — PHOSPHORUS: Phosphorus: 4.6 mg/dL (ref 2.5–4.6)

## 2020-06-16 LAB — MAGNESIUM: Magnesium: 2.1 mg/dL (ref 1.7–2.4)

## 2020-06-18 LAB — CBC
HCT: 33.6 % — ABNORMAL LOW (ref 39.0–52.0)
Hemoglobin: 10.6 g/dL — ABNORMAL LOW (ref 13.0–17.0)
MCH: 30.7 pg (ref 26.0–34.0)
MCHC: 31.5 g/dL (ref 30.0–36.0)
MCV: 97.4 fL (ref 80.0–100.0)
Platelets: 274 10*3/uL (ref 150–400)
RBC: 3.45 MIL/uL — ABNORMAL LOW (ref 4.22–5.81)
RDW: 17.8 % — ABNORMAL HIGH (ref 11.5–15.5)
WBC: 10.3 10*3/uL (ref 4.0–10.5)
nRBC: 0 % (ref 0.0–0.2)

## 2020-06-18 LAB — BASIC METABOLIC PANEL
Anion gap: 8 (ref 5–15)
BUN: 9 mg/dL (ref 8–23)
CO2: 30 mmol/L (ref 22–32)
Calcium: 8.7 mg/dL — ABNORMAL LOW (ref 8.9–10.3)
Chloride: 102 mmol/L (ref 98–111)
Creatinine, Ser: 0.52 mg/dL — ABNORMAL LOW (ref 0.61–1.24)
GFR, Estimated: >60 mL/min (ref >60)
Glucose, Bld: 85 mg/dL (ref 70–99)
Potassium: 3.4 mmol/L — ABNORMAL LOW (ref 3.5–5.1)
Sodium: 140 mmol/L (ref 135–145)

## 2020-06-18 LAB — PHOSPHORUS: Phosphorus: 4.3 mg/dL (ref 2.5–4.6)

## 2020-06-18 LAB — MAGNESIUM: Magnesium: 2.2 mg/dL (ref 1.7–2.4)

## 2020-06-19 DIAGNOSIS — F32A Depression, unspecified: Secondary | ICD-10-CM | POA: Diagnosis not present

## 2020-06-19 DIAGNOSIS — Z7409 Other reduced mobility: Secondary | ICD-10-CM | POA: Diagnosis not present

## 2020-06-19 DIAGNOSIS — G7281 Critical illness myopathy: Secondary | ICD-10-CM | POA: Diagnosis not present

## 2020-06-19 DIAGNOSIS — R0902 Hypoxemia: Secondary | ICD-10-CM | POA: Diagnosis not present

## 2020-06-19 DIAGNOSIS — R1310 Dysphagia, unspecified: Secondary | ICD-10-CM | POA: Diagnosis not present

## 2020-06-19 DIAGNOSIS — E1165 Type 2 diabetes mellitus with hyperglycemia: Secondary | ICD-10-CM | POA: Diagnosis not present

## 2020-06-19 DIAGNOSIS — J1282 Pneumonia due to coronavirus disease 2019: Secondary | ICD-10-CM | POA: Diagnosis not present

## 2020-06-19 DIAGNOSIS — R058 Other specified cough: Secondary | ICD-10-CM | POA: Diagnosis not present

## 2020-06-19 DIAGNOSIS — Z743 Need for continuous supervision: Secondary | ICD-10-CM | POA: Diagnosis not present

## 2020-06-19 DIAGNOSIS — I82403 Acute embolism and thrombosis of unspecified deep veins of lower extremity, bilateral: Secondary | ICD-10-CM | POA: Diagnosis not present

## 2020-06-19 DIAGNOSIS — R279 Unspecified lack of coordination: Secondary | ICD-10-CM | POA: Diagnosis not present

## 2020-06-19 DIAGNOSIS — F419 Anxiety disorder, unspecified: Secondary | ICD-10-CM | POA: Diagnosis not present

## 2020-06-19 DIAGNOSIS — J189 Pneumonia, unspecified organism: Secondary | ICD-10-CM | POA: Diagnosis not present

## 2020-06-19 DIAGNOSIS — I110 Hypertensive heart disease with heart failure: Secondary | ICD-10-CM | POA: Diagnosis not present

## 2020-06-19 DIAGNOSIS — E46 Unspecified protein-calorie malnutrition: Secondary | ICD-10-CM | POA: Diagnosis not present

## 2020-06-19 DIAGNOSIS — G4733 Obstructive sleep apnea (adult) (pediatric): Secondary | ICD-10-CM | POA: Diagnosis not present

## 2020-06-19 DIAGNOSIS — I82409 Acute embolism and thrombosis of unspecified deep veins of unspecified lower extremity: Secondary | ICD-10-CM | POA: Diagnosis not present

## 2020-06-19 DIAGNOSIS — U071 COVID-19: Secondary | ICD-10-CM | POA: Diagnosis not present

## 2020-06-19 DIAGNOSIS — I5033 Acute on chronic diastolic (congestive) heart failure: Secondary | ICD-10-CM | POA: Diagnosis not present

## 2020-06-19 DIAGNOSIS — E876 Hypokalemia: Secondary | ICD-10-CM | POA: Diagnosis not present

## 2020-06-19 DIAGNOSIS — G4720 Circadian rhythm sleep disorder, unspecified type: Secondary | ICD-10-CM | POA: Diagnosis not present

## 2020-06-19 DIAGNOSIS — J9600 Acute respiratory failure, unspecified whether with hypoxia or hypercapnia: Secondary | ICD-10-CM | POA: Diagnosis not present

## 2020-06-19 LAB — POTASSIUM: Potassium: 4.1 mmol/L (ref 3.5–5.1)

## 2020-07-01 DIAGNOSIS — E1165 Type 2 diabetes mellitus with hyperglycemia: Secondary | ICD-10-CM | POA: Diagnosis not present

## 2020-07-01 DIAGNOSIS — E46 Unspecified protein-calorie malnutrition: Secondary | ICD-10-CM | POA: Diagnosis not present

## 2020-07-01 DIAGNOSIS — U071 COVID-19: Secondary | ICD-10-CM | POA: Diagnosis not present

## 2020-07-03 DIAGNOSIS — I110 Hypertensive heart disease with heart failure: Secondary | ICD-10-CM | POA: Diagnosis not present

## 2020-07-03 DIAGNOSIS — F32A Depression, unspecified: Secondary | ICD-10-CM | POA: Diagnosis not present

## 2020-07-03 DIAGNOSIS — J45909 Unspecified asthma, uncomplicated: Secondary | ICD-10-CM | POA: Diagnosis not present

## 2020-07-03 DIAGNOSIS — I82403 Acute embolism and thrombosis of unspecified deep veins of lower extremity, bilateral: Secondary | ICD-10-CM | POA: Diagnosis not present

## 2020-07-03 DIAGNOSIS — E440 Moderate protein-calorie malnutrition: Secondary | ICD-10-CM | POA: Diagnosis not present

## 2020-07-03 DIAGNOSIS — U071 COVID-19: Secondary | ICD-10-CM | POA: Diagnosis not present

## 2020-07-03 DIAGNOSIS — I5033 Acute on chronic diastolic (congestive) heart failure: Secondary | ICD-10-CM | POA: Diagnosis not present

## 2020-07-03 DIAGNOSIS — E1165 Type 2 diabetes mellitus with hyperglycemia: Secondary | ICD-10-CM | POA: Diagnosis not present

## 2020-07-03 DIAGNOSIS — J1282 Pneumonia due to coronavirus disease 2019: Secondary | ICD-10-CM | POA: Diagnosis not present

## 2020-07-04 DIAGNOSIS — E1165 Type 2 diabetes mellitus with hyperglycemia: Secondary | ICD-10-CM | POA: Diagnosis not present

## 2020-07-04 DIAGNOSIS — I82403 Acute embolism and thrombosis of unspecified deep veins of lower extremity, bilateral: Secondary | ICD-10-CM | POA: Diagnosis not present

## 2020-07-04 DIAGNOSIS — I5033 Acute on chronic diastolic (congestive) heart failure: Secondary | ICD-10-CM | POA: Diagnosis not present

## 2020-07-04 DIAGNOSIS — J45909 Unspecified asthma, uncomplicated: Secondary | ICD-10-CM | POA: Diagnosis not present

## 2020-07-04 DIAGNOSIS — J1282 Pneumonia due to coronavirus disease 2019: Secondary | ICD-10-CM | POA: Diagnosis not present

## 2020-07-04 DIAGNOSIS — I110 Hypertensive heart disease with heart failure: Secondary | ICD-10-CM | POA: Diagnosis not present

## 2020-07-04 DIAGNOSIS — F32A Depression, unspecified: Secondary | ICD-10-CM | POA: Diagnosis not present

## 2020-07-04 DIAGNOSIS — U071 COVID-19: Secondary | ICD-10-CM | POA: Diagnosis not present

## 2020-07-04 DIAGNOSIS — E440 Moderate protein-calorie malnutrition: Secondary | ICD-10-CM | POA: Diagnosis not present

## 2020-07-05 DIAGNOSIS — F32A Depression, unspecified: Secondary | ICD-10-CM | POA: Diagnosis not present

## 2020-07-05 DIAGNOSIS — J1282 Pneumonia due to coronavirus disease 2019: Secondary | ICD-10-CM | POA: Diagnosis not present

## 2020-07-05 DIAGNOSIS — J45909 Unspecified asthma, uncomplicated: Secondary | ICD-10-CM | POA: Diagnosis not present

## 2020-07-05 DIAGNOSIS — I82403 Acute embolism and thrombosis of unspecified deep veins of lower extremity, bilateral: Secondary | ICD-10-CM | POA: Diagnosis not present

## 2020-07-05 DIAGNOSIS — U071 COVID-19: Secondary | ICD-10-CM | POA: Diagnosis not present

## 2020-07-05 DIAGNOSIS — E440 Moderate protein-calorie malnutrition: Secondary | ICD-10-CM | POA: Diagnosis not present

## 2020-07-05 DIAGNOSIS — E1165 Type 2 diabetes mellitus with hyperglycemia: Secondary | ICD-10-CM | POA: Diagnosis not present

## 2020-07-05 DIAGNOSIS — I5033 Acute on chronic diastolic (congestive) heart failure: Secondary | ICD-10-CM | POA: Diagnosis not present

## 2020-07-05 DIAGNOSIS — I110 Hypertensive heart disease with heart failure: Secondary | ICD-10-CM | POA: Diagnosis not present

## 2020-07-07 DIAGNOSIS — F3341 Major depressive disorder, recurrent, in partial remission: Secondary | ICD-10-CM | POA: Diagnosis not present

## 2020-07-07 DIAGNOSIS — F411 Generalized anxiety disorder: Secondary | ICD-10-CM | POA: Diagnosis not present

## 2020-07-07 DIAGNOSIS — E559 Vitamin D deficiency, unspecified: Secondary | ICD-10-CM | POA: Diagnosis not present

## 2020-07-07 DIAGNOSIS — Z09 Encounter for follow-up examination after completed treatment for conditions other than malignant neoplasm: Secondary | ICD-10-CM | POA: Diagnosis not present

## 2020-07-07 DIAGNOSIS — Z8616 Personal history of COVID-19: Secondary | ICD-10-CM | POA: Diagnosis not present

## 2020-07-07 DIAGNOSIS — G4733 Obstructive sleep apnea (adult) (pediatric): Secondary | ICD-10-CM | POA: Diagnosis not present

## 2020-07-07 DIAGNOSIS — F5101 Primary insomnia: Secondary | ICD-10-CM | POA: Diagnosis not present

## 2020-07-10 DIAGNOSIS — I110 Hypertensive heart disease with heart failure: Secondary | ICD-10-CM | POA: Diagnosis not present

## 2020-07-10 DIAGNOSIS — I5033 Acute on chronic diastolic (congestive) heart failure: Secondary | ICD-10-CM | POA: Diagnosis not present

## 2020-07-10 DIAGNOSIS — J45909 Unspecified asthma, uncomplicated: Secondary | ICD-10-CM | POA: Diagnosis not present

## 2020-07-10 DIAGNOSIS — E1165 Type 2 diabetes mellitus with hyperglycemia: Secondary | ICD-10-CM | POA: Diagnosis not present

## 2020-07-10 DIAGNOSIS — I82403 Acute embolism and thrombosis of unspecified deep veins of lower extremity, bilateral: Secondary | ICD-10-CM | POA: Diagnosis not present

## 2020-07-10 DIAGNOSIS — E440 Moderate protein-calorie malnutrition: Secondary | ICD-10-CM | POA: Diagnosis not present

## 2020-07-10 DIAGNOSIS — U071 COVID-19: Secondary | ICD-10-CM | POA: Diagnosis not present

## 2020-07-10 DIAGNOSIS — F32A Depression, unspecified: Secondary | ICD-10-CM | POA: Diagnosis not present

## 2020-07-10 DIAGNOSIS — J1282 Pneumonia due to coronavirus disease 2019: Secondary | ICD-10-CM | POA: Diagnosis not present

## 2020-07-11 DIAGNOSIS — E1165 Type 2 diabetes mellitus with hyperglycemia: Secondary | ICD-10-CM | POA: Diagnosis not present

## 2020-07-11 DIAGNOSIS — U071 COVID-19: Secondary | ICD-10-CM | POA: Diagnosis not present

## 2020-07-11 DIAGNOSIS — E440 Moderate protein-calorie malnutrition: Secondary | ICD-10-CM | POA: Diagnosis not present

## 2020-07-11 DIAGNOSIS — F32A Depression, unspecified: Secondary | ICD-10-CM | POA: Diagnosis not present

## 2020-07-11 DIAGNOSIS — I82403 Acute embolism and thrombosis of unspecified deep veins of lower extremity, bilateral: Secondary | ICD-10-CM | POA: Diagnosis not present

## 2020-07-11 DIAGNOSIS — J45909 Unspecified asthma, uncomplicated: Secondary | ICD-10-CM | POA: Diagnosis not present

## 2020-07-11 DIAGNOSIS — I110 Hypertensive heart disease with heart failure: Secondary | ICD-10-CM | POA: Diagnosis not present

## 2020-07-11 DIAGNOSIS — J1282 Pneumonia due to coronavirus disease 2019: Secondary | ICD-10-CM | POA: Diagnosis not present

## 2020-07-11 DIAGNOSIS — I5033 Acute on chronic diastolic (congestive) heart failure: Secondary | ICD-10-CM | POA: Diagnosis not present

## 2020-07-13 DIAGNOSIS — J45909 Unspecified asthma, uncomplicated: Secondary | ICD-10-CM | POA: Diagnosis not present

## 2020-07-13 DIAGNOSIS — F32A Depression, unspecified: Secondary | ICD-10-CM | POA: Diagnosis not present

## 2020-07-13 DIAGNOSIS — I5033 Acute on chronic diastolic (congestive) heart failure: Secondary | ICD-10-CM | POA: Diagnosis not present

## 2020-07-13 DIAGNOSIS — U071 COVID-19: Secondary | ICD-10-CM | POA: Diagnosis not present

## 2020-07-13 DIAGNOSIS — I82403 Acute embolism and thrombosis of unspecified deep veins of lower extremity, bilateral: Secondary | ICD-10-CM | POA: Diagnosis not present

## 2020-07-13 DIAGNOSIS — E1165 Type 2 diabetes mellitus with hyperglycemia: Secondary | ICD-10-CM | POA: Diagnosis not present

## 2020-07-13 DIAGNOSIS — I110 Hypertensive heart disease with heart failure: Secondary | ICD-10-CM | POA: Diagnosis not present

## 2020-07-13 DIAGNOSIS — J1282 Pneumonia due to coronavirus disease 2019: Secondary | ICD-10-CM | POA: Diagnosis not present

## 2020-07-13 DIAGNOSIS — E440 Moderate protein-calorie malnutrition: Secondary | ICD-10-CM | POA: Diagnosis not present

## 2020-07-13 NOTE — Progress Notes (Signed)
Cardiology Office Note:   Date:  07/14/2020  NAME:  Clayton [name]    MRN: [id number] DOB:  July 26, 1956   PCP:  Kristen Loader, FNP  Cardiologist:  No primary care provider on file.   Referring MD: Kristen Loader, FNP   Chief Complaint  Patient presents with   Shortness of Breath   History of Present Illness:   Clayton [name] is a 63 y.o. male with a hx of DM, HLD, covid, DVT who is being seen today for the evaluation of shortness of breath at the request of Kristen Loader, FNP.  Recent extensive hospitalization in October for COVID-19 pneumonia.  He is still on home oxygen.  He is still suffering from desaturations and hypoxemia.  He has not seen pulmonary.  While he was in the hospital he was told he had congestive heart failure.  I reviewed his echocardiogram which shows he had normal LV function and grade 1 diastolic dysfunction.  His BNP was 44 which is inconsistent with a diagnosis of heart failure.  Any sort of heavy exertion does get him profoundly short of breath.  Again, waiting to see pulmonary.  His wife also had pneumonia and she is on oxygen therapy.  He is euvolemic on examination today.  EKG shows normal sinus rhythm with no acute ischemic changes or evidence of prior infarction.  He has no chest pain or chest pressure.  He used to have a history of high blood pressure but that is not the case now.  He is diabetic.  Most recent A1c 6.1.  He does have elevated LDL cholesterol and was on a statin at one point.  He stopped taking this.  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  He is on Xarelto.  He will complete 3 to 6 months of therapy and then reevaluate that.  He is a never smoker.  No alcohol or drug use is reported.  He is currently not working due to his current condition.  He reports a strong family history of heart disease in parents.  Problem List 1. DM -A1c 6.1 2. OSA 3. Covid 19 PNA -admit 05/2020 for severe PNA/covid 4. 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  HLD -T chol 169, HDL 32, LDL 112, TG 123  Past Medical History: Past Medical History:  Diagnosis Date   Arthritis    Asthma    pulmonary allergies- no asthma per pt   Atrophic condition of skin    Cellulitis/abscess - trunk    Cyst    shoulder   Diabetes mellitus without complication (HCC)    Hyperlipidemia    Hypertension    Hypertrophic condition of skin    Lipoma    Obesity    Pneumonia    hx child   Sleep apnea    no cpap   Stones in the urinary tract    Vitamin D deficiency     Past Surgical History: Past Surgical History:  Procedure Laterality Date   cyst removed from [place] N/A 05/23/2015   Procedure: INSERTION OF MESH;  Surgeon: [name], MD;  Location: [place];  Service: General;  Laterality: N/A;   MASS EXCISION  07/15/11   left shoulder mass    NASAL SINUS SURGERY---UVULA REMOVED  2004   Dr. Wilburn Cornelia   PROSTATE BIOPSY  9/16   [place] N/A 05/23/2015   Procedure: LAPAROSCOPIC UMBILICAL HERNIA REPAIR ;  Surgeon:  [name], MD;  Location: [place];  Service: General;  Laterality: N/A;    Current Medications: Current Meds  Medication Sig   acetaminophen (TYLENOL) 325 MG tablet Take 2 tablets (650 mg total) by mouth every 6 (six) hours as needed for fever.   albuterol (VENTOLIN HFA) 108 (90 Base) MCG/ACT inhaler Inhale 2 puffs into the lungs every 4 (four) hours as needed for wheezing or shortness of breath.   Apixaban (ELIQUIS PO) Take 1 tablet by mouth in the morning and at bedtime.   furosemide (LASIX) 20 MG tablet Take 1 tablet (20 mg total) by mouth daily.   Ipratropium-Albuterol (COMBIVENT) 20-100 MCG/ACT AERS respimat Inhale 1 puff into the lungs 3 (three) times daily.   LORazepam (ATIVAN) 1 MG tablet Take 1 tablet (1 mg total) by mouth every 4 (four) hours as needed for anxiety.   metFORMIN (GLUCOPHAGE-XR) 750 MG 24 hr tablet Take 750 mg by mouth in the  morning and at bedtime.   potassium chloride (KLOR-CON) 10 MEQ tablet Take 1 tablet (10 mEq total) by mouth daily.   predniSONE (DELTASONE) 10 MG tablet Take 2 tablets daily for five days, then 1 tablet daily for five days, then half tablet daily for five days, then stop.   sertraline (ZOLOFT) 100 MG tablet Take 100 mg by mouth daily.   SITagliptin Phosphate (JANUVIA PO) Take 1 tablet by mouth at bedtime.   sodium chloride (OCEAN) 0.65 % SOLN nasal spray Place 1 spray into both nostrils as needed for congestion (nose irritation, give 1st for congestion).   [DISCONTINUED] atorvastatin (LIPITOR) 40 MG tablet Take 40 mg by mouth daily.    [DISCONTINUED] guaiFENesin-dextromethorphan (ROBITUSSIN DM) 100-10 MG/5ML syrup Take 10 mLs by mouth every 4 (four) hours as needed for cough.   [DISCONTINUED] magic mouthwash w/lidocaine SOLN Take 10 mLs by mouth 4 (four) times daily.   [DISCONTINUED] METOPROLOL TARTRATE PO Take 1 tablet by mouth in the morning and at bedtime.   [DISCONTINUED] rivaroxaban (XARELTO) 20 MG TABS tablet Take 1 tablet (20 mg total) by mouth daily with supper.     Allergies:    Patient has no known allergies.   Social History: Social History   Socioeconomic History   Marital status: Married    Spouse name: Not on file   Number of children: 1   Years of education: Not on file   Highest education level: Not on file  Occupational History   Occupation: Chief of Staff: [employer]  Tobacco Use   Smoking status: Never Smoker   Smokeless tobacco: Never Used  Substance and Sexual Activity   Alcohol use: Not Currently    Alcohol/week: 10.0 standard drinks    Types: 10 Cans of beer per week   Drug use: No   Sexual activity: Not Currently  Other Topics Concern   Not on file  Social History Narrative   Not on file   Social Determinants of Health   Financial Resource Strain: Not on file  Food Insecurity: Not on file  Transportation Needs: Not  on file  Physical Activity: Not on file  Stress: Not on file  Social Connections: Not on file     Family History: The patient's family history includes Allergies in his mother; Asthma in his mother; Heart attack in his father; Heart disease in his father.  ROS:   All other ROS reviewed and negative. Pertinent positives noted in the HPI.     EKGs/Labs/Other Studies Reviewed:   The following  studies were personally reviewed by me today:  EKG:  EKG is ordered today.  The ekg ordered today demonstrates normal sinus rhythm, heart rate 73, no acute ischemic changes, no evidence of prior infarction, and was personally reviewed by me.   TTE 05/02/2020 1. Limited study with poor echo windows, Definity contrast given  2. Left ventricular ejection fraction, by estimation, is 65 to 70%. The  left ventricle has normal function. The left ventricle has no regional  wall motion abnormalities. There is mild left ventricular hypertrophy.  Left ventricular diastolic parameters  are consistent with Grade I diastolic dysfunction (impaired relaxation).  3. The aortic valve is tricuspid. Aortic valve regurgitation is not  visualized. Mild aortic valve sclerosis is present, with no evidence of  aortic valve stenosis.   Recent Labs: 05/08/2020: B Natriuretic Peptide 44.5 05/31/2020: TSH 4.077 06/13/2020: ALT 33 06/18/2020: BUN 9; Creatinine, Ser 0.52; Hemoglobin 10.6; Magnesium 2.2; Platelets 274; Sodium 140 06/19/2020: Potassium 4.1   Recent Lipid Panel    Component Value Date/Time   CHOL 169 04/25/2020 0950   TRIG 123 04/25/2020 0950   HDL 32 (L) 04/25/2020 0950   CHOLHDL 5.3 04/25/2020 0950   VLDL 25 04/25/2020 0950   LDLCALC 112 (H) 04/25/2020 0950   LDLDIRECT 162.1 12/21/2007 0000    Physical Exam:   VS:  BP 102/70 (BP Location: Left Arm, Patient Position: Sitting, Cuff Size: Normal)    Pulse 73    Ht 5\' 8"  (1.727 m)    Wt 206 lb (93.4 kg)    BMI 31.32 kg/m    Wt Readings from Last 3  Encounters:  07/14/20 206 lb (93.4 kg)  05/26/20 207 lb 3.2 oz (94 kg)  08/25/15 236 lb (107 kg)    General: Well nourished, well developed, in no acute distress Head: Atraumatic, normal size  Eyes: PEERLA, EOMI  Neck: Supple, no JVD Endocrine: No thryomegaly Cardiac: Normal S1, S2; RRR; no murmurs, rubs, or gallops Lungs: Clear to auscultation bilaterally, no wheezing, rhonchi or rales  Abd: Soft, nontender, no hepatomegaly  Ext: No edema, pulses 2+ Musculoskeletal: No deformities, BUE and BLE strength normal and equal Skin: Warm and dry, no rashes   Neuro: Alert and oriented to person, place, time, and situation, CNII-XII grossly intact, no focal deficits  Psych: Normal mood and affect   ASSESSMENT:   Clayton [name] is a 64 y.o. male who presents for the following: 1. SOB (shortness of breath) on exertion   2. Mixed hyperlipidemia     PLAN:   1. SOB (shortness of breath) on exertion -He was supposed to the hospital and had an echocardiogram which showed grade 1 diastolic dysfunction.  He had a BNP that was normal.  This is inconsistent with congestive heart failure.  He is euvolemic on examination.  I have told him to take the Lasix medication as needed.  He should also take potassium as needed.  This is for lower extremity edema.  I would like to repeat his testing.  I would like to repeat a BNP and echocardiogram.  He does not appear to have congestive heart failure my opinion.  His lungs are clear and he has no JVD.  His EKG is normal.  We will see if repeat testing shows any evidence of diastolic heart failure.  Right now clinically he does not have heart failure. -I think his shortness of breath is clearly related to lung disease.  I would like for him to be evaluated by pulmonary.  He  is awaiting his appointment.  2. Mixed hyperlipidemia -We did discuss that cholesterol medications indicated.  He reports he will think about this.  Apparently recent lab work looked pretty  good.  Disposition: Return in about 6 months (around 01/12/2021).  Medication Adjustments/Labs and Tests Ordered: Current medicines are reviewed at length with the patient today.  Concerns regarding medicines are outlined above.  Orders Placed This Encounter  Procedures   Brain natriuretic peptide   EKG 12-Lead   ECHOCARDIOGRAM COMPLETE   No orders of the defined types were placed in this encounter.   Patient Instructions  Medication Instructions:  No changes *If you need a refill on your cardiac medications before your next appointment, please call your pharmacy*   Lab Work: BNP  If you have labs (blood work) drawn today and your tests are completely normal, you will receive your results only by:  [place] (if you have MyChart) OR  A paper copy in the mail If you have any lab test that is abnormal or we need to change your treatment, we will call you to review the results.   Testing/Procedures: Your physician has requested that you have an echocardiogram. Echocardiography is a painless test that uses sound waves to create images of your heart. It provides your doctor with information about the size and shape of your heart and how well your hearts chambers and valves are working. This procedure takes approximately one hour. There are no restrictions for this procedure. This test is performed at [address].   Follow-Up: At [facility], you and your health needs are our priority.  As part of our continuing mission to provide you with exceptional heart care, we have created designated Provider Care Teams.  These Care Teams include your primary Cardiologist (physician) and Advanced Practice Providers (APPs -  Physician Assistants and Nurse Practitioners) who all work together to provide you with the care you need, when you need it.  We recommend signing up for the patient portal called "MyChart".  Sign up information is provided on this After Visit Summary.   MyChart is used to connect with patients for Virtual Visits (Telemedicine).  Patients are able to view lab/test results, encounter notes, upcoming appointments, etc.  Non-urgent messages can be sent to your provider as well.   To learn more about what you can do with MyChart, go to [email or web address].    Your next appointment:   6 month(s)  The format for your next appointment:   In Person  Provider:   Eleonore Chiquito, MD   Other Instructions None       Signed, Addison Naegeli. Audie Box, [place]  [address], [place] [place], [place] [address] [phone]  07/14/2020 5:11 PM

## 2020-07-14 ENCOUNTER — Ambulatory Visit (INDEPENDENT_AMBULATORY_CARE_PROVIDER_SITE_OTHER): Payer: Self-pay | Admitting: Cardiovascular Disease

## 2020-07-14 ENCOUNTER — Encounter: Payer: Self-pay | Admitting: Cardiovascular Disease

## 2020-07-14 ENCOUNTER — Other Ambulatory Visit: Payer: Self-pay

## 2020-07-14 VITALS — BP 102/70 | HR 73 | Ht 68.0 in | Wt 206.0 lb

## 2020-07-14 DIAGNOSIS — I5033 Acute on chronic diastolic (congestive) heart failure: Secondary | ICD-10-CM | POA: Diagnosis not present

## 2020-07-14 DIAGNOSIS — E782 Mixed hyperlipidemia: Secondary | ICD-10-CM

## 2020-07-14 DIAGNOSIS — I82403 Acute embolism and thrombosis of unspecified deep veins of lower extremity, bilateral: Secondary | ICD-10-CM | POA: Diagnosis not present

## 2020-07-14 DIAGNOSIS — E1165 Type 2 diabetes mellitus with hyperglycemia: Secondary | ICD-10-CM | POA: Diagnosis not present

## 2020-07-14 DIAGNOSIS — I110 Hypertensive heart disease with heart failure: Secondary | ICD-10-CM | POA: Diagnosis not present

## 2020-07-14 DIAGNOSIS — U071 COVID-19: Secondary | ICD-10-CM | POA: Diagnosis not present

## 2020-07-14 DIAGNOSIS — R0602 Shortness of breath: Secondary | ICD-10-CM | POA: Diagnosis not present

## 2020-07-14 DIAGNOSIS — J1282 Pneumonia due to coronavirus disease 2019: Secondary | ICD-10-CM | POA: Diagnosis not present

## 2020-07-14 DIAGNOSIS — E440 Moderate protein-calorie malnutrition: Secondary | ICD-10-CM | POA: Diagnosis not present

## 2020-07-14 DIAGNOSIS — J45909 Unspecified asthma, uncomplicated: Secondary | ICD-10-CM | POA: Diagnosis not present

## 2020-07-14 DIAGNOSIS — F32A Depression, unspecified: Secondary | ICD-10-CM | POA: Diagnosis not present

## 2020-07-14 NOTE — Patient Instructions (Signed)
Medication Instructions:  No changes *If you need a refill on your cardiac medications before your next appointment, please call your pharmacy*   Lab Work: BNP  If you have labs (blood work) drawn today and your tests are completely normal, you will receive your results only by: [facility] MyChart Message (if you have MyChart) OR . A paper copy in the mail If you have any lab test that is abnormal or we need to change your treatment, we will call you to review the results.   Testing/Procedures: Your physician has requested that you have an echocardiogram. Echocardiography is a painless test that uses sound waves to create images of your heart. It provides your doctor with information about the size and shape of your heart and how well your heart's chambers and valves are working. This procedure takes approximately one hour. There are no restrictions for this procedure. This test is performed at [address].   Follow-Up: At [facility], you and your health needs are our priority.  As part of our continuing mission to provide you with exceptional heart care, we have created designated Provider Care Teams.  These Care Teams include your primary Cardiologist (physician) and Advanced Practice Providers (APPs -  Physician Assistants and Nurse Practitioners) who all work together to provide you with the care you need, when you need it.  We recommend signing up for the patient portal called "MyChart".  Sign up information is provided on this After Visit Summary.  MyChart is used to connect with patients for Virtual Visits (Telemedicine).  Patients are able to view lab/test results, encounter notes, upcoming appointments, etc.  Non-urgent messages can be sent to your provider as well.   To learn more about what you can do with MyChart, go to [email or web address].    Your next appointment:   6 month(s)  The format for your next appointment:   In Person  Provider:   Eleonore Chiquito, MD   Other  Instructions None

## 2020-07-17 DIAGNOSIS — U071 COVID-19: Secondary | ICD-10-CM | POA: Diagnosis not present

## 2020-07-17 DIAGNOSIS — E440 Moderate protein-calorie malnutrition: Secondary | ICD-10-CM | POA: Diagnosis not present

## 2020-07-17 DIAGNOSIS — F32A Depression, unspecified: Secondary | ICD-10-CM | POA: Diagnosis not present

## 2020-07-17 DIAGNOSIS — I5033 Acute on chronic diastolic (congestive) heart failure: Secondary | ICD-10-CM | POA: Diagnosis not present

## 2020-07-17 DIAGNOSIS — J1282 Pneumonia due to coronavirus disease 2019: Secondary | ICD-10-CM | POA: Diagnosis not present

## 2020-07-17 DIAGNOSIS — E1165 Type 2 diabetes mellitus with hyperglycemia: Secondary | ICD-10-CM | POA: Diagnosis not present

## 2020-07-17 DIAGNOSIS — I82403 Acute embolism and thrombosis of unspecified deep veins of lower extremity, bilateral: Secondary | ICD-10-CM | POA: Diagnosis not present

## 2020-07-17 DIAGNOSIS — I110 Hypertensive heart disease with heart failure: Secondary | ICD-10-CM | POA: Diagnosis not present

## 2020-07-17 DIAGNOSIS — J45909 Unspecified asthma, uncomplicated: Secondary | ICD-10-CM | POA: Diagnosis not present

## 2020-07-19 ENCOUNTER — Encounter: Payer: Self-pay | Admitting: Pulmonary Disease

## 2020-07-19 ENCOUNTER — Ambulatory Visit (INDEPENDENT_AMBULATORY_CARE_PROVIDER_SITE_OTHER): Payer: HRSA Program | Admitting: Pulmonary Disease

## 2020-07-19 ENCOUNTER — Other Ambulatory Visit: Payer: Self-pay

## 2020-07-19 VITALS — BP 126/74 | HR 102 | Ht 68.0 in | Wt 206.2 lb

## 2020-07-19 DIAGNOSIS — J1282 Pneumonia due to coronavirus disease 2019: Secondary | ICD-10-CM

## 2020-07-19 DIAGNOSIS — J9611 Chronic respiratory failure with hypoxia: Secondary | ICD-10-CM | POA: Diagnosis not present

## 2020-07-19 DIAGNOSIS — E440 Moderate protein-calorie malnutrition: Secondary | ICD-10-CM | POA: Diagnosis not present

## 2020-07-19 DIAGNOSIS — E1165 Type 2 diabetes mellitus with hyperglycemia: Secondary | ICD-10-CM | POA: Diagnosis not present

## 2020-07-19 DIAGNOSIS — J45909 Unspecified asthma, uncomplicated: Secondary | ICD-10-CM | POA: Diagnosis not present

## 2020-07-19 DIAGNOSIS — G4733 Obstructive sleep apnea (adult) (pediatric): Secondary | ICD-10-CM | POA: Diagnosis not present

## 2020-07-19 DIAGNOSIS — U071 COVID-19: Secondary | ICD-10-CM

## 2020-07-19 DIAGNOSIS — I5033 Acute on chronic diastolic (congestive) heart failure: Secondary | ICD-10-CM | POA: Diagnosis not present

## 2020-07-19 DIAGNOSIS — I110 Hypertensive heart disease with heart failure: Secondary | ICD-10-CM | POA: Diagnosis not present

## 2020-07-19 DIAGNOSIS — I82403 Acute embolism and thrombosis of unspecified deep veins of lower extremity, bilateral: Secondary | ICD-10-CM

## 2020-07-19 DIAGNOSIS — F32A Depression, unspecified: Secondary | ICD-10-CM | POA: Diagnosis not present

## 2020-07-19 MED ORDER — FLOVENT HFA 220 MCG/ACT IN AERO: 2.0000 | INHALATION_SPRAY | Freq: Two times a day (BID) | RESPIRATORY_TRACT | 12 refills | Status: DC

## 2020-07-19 NOTE — Progress Notes (Signed)
Synopsis: Referred by [name], NP for respiratory failure  Subjective:   PATIENT ID: Clayton [name] GENDER: male DOB: Dec 23, 1955, MRN: [id number]   HPI  Chief Complaint  Patient presents with  . Consult    Pt is being referred due to having problems with breathing after diagnosis of Covid. Pt was placed on O2 at the hospital and was also discharged home from hospital on O2. Pt wears 1-2L 24/7. Pt still has problems with breathing when going up stairs and also is still gaining strength back. Pt will have some problems with saturations dropping to the 60's on O2 and it will take about 3-4L to get sats back up to stable range.   Clayton [name] is a 64 year old male, never smoker with history of obstructive sleep apnea, obesity, diabetes mellitus type II and asthma who is referred to pulmonary clinic for post-covid follow up.   He was admitted 04/24/20 to 05/30/20 for respiratory failure in the setting of covid 19 pneumonia. He was also found to have bilateral DVT of his lower extremities in which he remains on eliquis. He is using 1-2 liters at rest and 3-4 liters with ambulation of supplemental oxygen.   Prior to covid he had a chronic cough. He reports history of having colds in the past with prolonged symptoms of cough, shortness of breathing and wheezing.  He was diagnosed with sleep apnea many years ago but never used CPAP therapy as he did not like the mask. He reports weighing more now than when he was previously tested. He required CPAP/Bipap during his admission and reports he would be willing to be tested for sleep apnea and try CPAP/Bipap therapy if needed now.   Overall he reports he is slowly getting better since his admission. He is finishing a steroid taper early next week.      Past Medical History:  Diagnosis Date  . Arthritis   . Asthma    pulmonary allergies- no asthma per pt  . Atrophic condition of skin   . Cellulitis/abscess - trunk   . Cyst    shoulder  .  Diabetes mellitus without complication ([place])   . Hyperlipidemia   . Hypertension   . Hypertrophic condition of skin   . Lipoma   . Obesity   . Pneumonia    hx child  . Sleep apnea    no cpap  . Stones in the urinary tract   . Vitamin D deficiency      Family History  Problem Relation Age of Onset  . Heart attack Father   . Heart disease Father        heart attack  . Allergies Mother   . Asthma Mother      Social History   Socioeconomic History  . Marital status: Married    Spouse name: Not on file  . Number of children: 1  . Years of education: Not on file  . Highest education level: Not on file  Occupational History  . Occupation: Chief of Staff: [employer]  Tobacco Use  . Smoking status: Never Smoker  . Smokeless tobacco: Never Used  Substance and Sexual Activity  . Alcohol use: Not Currently    Alcohol/week: 10.0 standard drinks    Types: 10 Cans of beer per week  . Drug use: No  . Sexual activity: Not Currently  Other Topics Concern  . Not on file  Social History Narrative  . Not on file   Social Determinants  of Health   Financial Resource Strain: Not on file  Food Insecurity: Not on file  Transportation Needs: Not on file  Physical Activity: Not on file  Stress: Not on file  Social Connections: Not on file  Intimate Partner Violence: Not on file     No Known Allergies   Outpatient Medications Prior to Visit  Medication Sig Dispense Refill  . acetaminophen (TYLENOL) 325 MG tablet Take 2 tablets (650 mg total) by mouth every 6 (six) hours as needed for fever.    Marland Kitchen albuterol (VENTOLIN HFA) 108 (90 Base) MCG/ACT inhaler Inhale 2 puffs into the lungs every 4 (four) hours as needed for wheezing or shortness of breath. 1 each 0  . Apixaban (ELIQUIS PO) Take 1 tablet by mouth in the morning and at bedtime.    . clonazePAM (KLONOPIN) 0.5 MG tablet Take 0.5 mg by mouth 3 (three) times daily as needed.    Marland Kitchen ipratropium-albuterol (DUONEB) 0.5-2.5  (3) MG/3ML SOLN     . montelukast (SINGULAIR) 10 MG tablet Take 10 mg by mouth daily.    . predniSONE (DELTASONE) 10 MG tablet Take 2 tablets daily for five days, then 1 tablet daily for five days, then half tablet daily for five days, then stop. 18 tablet 0  . sertraline (ZOLOFT) 100 MG tablet Take 100 mg by mouth daily.    Marland Kitchen SITagliptin Phosphate (JANUVIA PO) Take 1 tablet by mouth at bedtime.    . sodium chloride (OCEAN) 0.65 % SOLN nasal spray Place 1 spray into both nostrils as needed for congestion (nose irritation, give 1st for congestion). 30 mL 0  . traZODone (DESYREL) 50 MG tablet     . furosemide (LASIX) 20 MG tablet Take 1 tablet (20 mg total) by mouth daily. 30 tablet 0  . potassium chloride (KLOR-CON) 10 MEQ tablet Take 1 tablet (10 mEq total) by mouth daily. 30 tablet 0  . Ipratropium-Albuterol (COMBIVENT) 20-100 MCG/ACT AERS respimat Inhale 1 puff into the lungs 3 (three) times daily. 1 each 0  . LORazepam (ATIVAN) 1 MG tablet Take 1 tablet (1 mg total) by mouth every 4 (four) hours as needed for anxiety. 10 tablet 0  . metFORMIN (GLUCOPHAGE-XR) 750 MG 24 hr tablet Take 750 mg by mouth in the morning and at bedtime.     No facility-administered medications prior to visit.    Review of Systems  Constitutional: Positive for malaise/fatigue. Negative for chills, fever and weight loss.  HENT: Negative for congestion, sinus pain and sore throat.   Eyes: Negative.   Respiratory: Positive for cough and shortness of breath. Negative for hemoptysis, sputum production and wheezing.   Cardiovascular: Negative for chest pain, palpitations, orthopnea, claudication, leg swelling and PND.  Gastrointestinal: Negative for abdominal pain, heartburn, nausea and vomiting.  Genitourinary: Negative.   Musculoskeletal: Negative.   Neurological: Negative for dizziness, weakness and headaches.  Psychiatric/Behavioral: Negative.    Objective:   Vitals:   07/19/20 1040  BP: 126/74  Pulse: (!)  102  SpO2: 98%  Weight: 206 lb 3.2 oz (93.5 kg)  Height: 5\' 8"  (1.727 m)     Physical Exam Constitutional:      General: He is not in acute distress.    Appearance: He is obese.  HENT:     Head: Normocephalic and atraumatic.     Nose: Nose normal.     Mouth/Throat:     Mouth: Mucous membranes are moist.     Pharynx: Oropharynx is clear.  Eyes:  General: No scleral icterus.    Conjunctiva/sclera: Conjunctivae normal.     Pupils: Pupils are equal, round, and reactive to light.  Cardiovascular:     Rate and Rhythm: Normal rate and regular rhythm.     Pulses: Normal pulses.     Heart sounds: Normal heart sounds. No murmur heard.   Pulmonary:     Effort: Pulmonary effort is normal.     Breath sounds: No wheezing, rhonchi or rales.  Abdominal:     General: Bowel sounds are normal.     Palpations: Abdomen is soft.  Musculoskeletal:     Right lower leg: No edema.     Left lower leg: No edema.  Skin:    General: Skin is warm and dry.     Capillary Refill: Capillary refill takes less than 2 seconds.  Neurological:     General: No focal deficit present.     Mental Status: He is alert.  Psychiatric:        Mood and Affect: Mood normal.        Behavior: Behavior normal.        Thought Content: Thought content normal.        Judgment: Judgment normal.    CBC    Component Value Date/Time   WBC 10.3 06/18/2020 0454   RBC 3.45 (L) 06/18/2020 0454   HGB 10.6 (L) 06/18/2020 0454   HCT 33.6 (L) 06/18/2020 0454   PLT 274 06/18/2020 0454   MCV 97.4 06/18/2020 0454   MCH 30.7 06/18/2020 0454   MCHC 31.5 06/18/2020 0454   RDW 17.8 (H) 06/18/2020 0454   LYMPHSABS 1.0 05/31/2020 0423   MONOABS 0.8 05/31/2020 0423   EOSABS 0.1 05/31/2020 0423   BASOSABS 0.0 05/31/2020 0423   BMP Latest Ref Rng & Units 06/19/2020 06/18/2020 06/16/2020  Glucose 70 - 99 mg/dL - 85 97  BUN 8 - 23 mg/dL - 9 12  Creatinine 0.61 - 1.24 mg/dL - 0.52(L) 0.56(L)  Sodium 135 - 145 mmol/L - 140 141   Potassium 3.5 - 5.1 mmol/L 4.1 3.4(L) 4.3  Chloride 98 - 111 mmol/L - 102 101  CO2 22 - 32 mmol/L - 30 31  Calcium 8.9 - 10.3 mg/dL - 8.7(L) 9.1   Chest imaging: CXR 06/15/2020 Stable low volume chest with diffuse coarse interstitial opacities, 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 No visible effusion or air leak. Stable heart size which is accentuated by technique.  PFT: No flowsheet data found.  Echo: 05/02/20 1. Limited study with poor echo windows, Definity contrast given  2. Left ventricular ejection fraction, by estimation, is 65 to 70%. The  left ventricle has normal function. The left ventricle has no regional  wall motion abnormalities. There is mild left ventricular hypertrophy.  Left ventricular diastolic parameters  are consistent with Grade I diastolic dysfunction (impaired relaxation).  3. The aortic valve is tricuspid. Aortic valve regurgitation is not  visualized. Mild aortic valve sclerosis is present, with no evidence of  aortic valve stenosis.   Lower extremity Doppler US 04/28/20 RIGHT:  - Findings consistent with acute deep vein thrombosis involving the right  posterior tibial veins, and right peroneal veins.  - No cystic structure found in the popliteal fossa.    LEFT:  - Findings consistent with acute deep vein thrombosis involving the left  posterior tibial veins, and left soleal veins.  - No cystic structure found in the popliteal fossa.      Assessment & Plan:   Chronic respiratory failure with  hypoxia ([place])  Pneumonia due to COVID-19 virus  Asthma, unspecified asthma severity, unspecified whether complicated, unspecified whether persistent  OSA (obstructive sleep apnea) - Plan: Split night study  Deep vein thrombosis (DVT) of both lower extremities, unspecified chronicity, unspecified vein (H[facility])  Discussion: Clayton [name] is a 64 year old male, never smoker with history of obstructive sleep apnea, obesity, diabetes mellitus type II and  asthma who is referred to pulmonary clinic for post-covid follow up.   He has chronic hypoxemic respiratory failure in setting of recent covid 19 pneumonia. There may have also been a component of pulmonary emboli given the findings of acute bilateral DVT of his lower extremities during his hospital admission.   He is to continue supplemental oxygen at this time and continue with physical therapy. He is completing a prolonged steroid taper since his hospital admission and LTACH stay. He is to start flovent 266mcg 2 puffs twice daily for his breathing and history of asthma. He can continue as needed albuterol.   He is to continue eliquis therapy for the DVT.   We will schedule him for a split night sleep study for his history of obstructive sleep apnea.   He is to follow up in 3 months. We will consider further chest imaging and pulmonary function testing at that time.  Freda Jackson, MD [facility] Office: [phone]   See Amion for Pager Details    Current Outpatient Medications:  .  acetaminophen (TYLENOL) 325 MG tablet, Take 2 tablets (650 mg total) by mouth every 6 (six) hours as needed for fever., Disp: , Rfl:  .  albuterol (VENTOLIN HFA) 108 (90 Base) MCG/ACT inhaler, Inhale 2 puffs into the lungs every 4 (four) hours as needed for wheezing or shortness of breath., Disp: 1 each, Rfl: 0 .  Apixaban (ELIQUIS PO), Take 1 tablet by mouth in the morning and at bedtime., Disp: , Rfl:  .  clonazePAM (KLONOPIN) 0.5 MG tablet, Take 0.5 mg by mouth 3 (three) times daily as needed., Disp: , Rfl:  .  ipratropium-albuterol (DUONEB) 0.5-2.5 (3) MG/3ML SOLN, , Disp: , Rfl:  .  montelukast (SINGULAIR) 10 MG tablet, Take 10 mg by mouth daily., Disp: , Rfl:  .  predniSONE (DELTASONE) 10 MG tablet, Take 2 tablets daily for five days, then 1 tablet daily for five days, then half tablet daily for five days, then stop., Disp: 18 tablet, Rfl: 0 .  sertraline (ZOLOFT) 100 MG  tablet, Take 100 mg by mouth daily., Disp: , Rfl:  .  SITagliptin Phosphate (JANUVIA PO), Take 1 tablet by mouth at bedtime., Disp: , Rfl:  .  sodium chloride (OCEAN) 0.65 % SOLN nasal spray, Place 1 spray into both nostrils as needed for congestion (nose irritation, give 1st for congestion)., Disp: 30 mL, Rfl: 0 .  traZODone (DESYREL) 50 MG tablet, , Disp: , Rfl:  .  fluticasone (FLOVENT HFA) 220 MCG/ACT inhaler, Inhale 2 puffs into the lungs in the morning and at bedtime., Disp: 1 each, Rfl: 12 .  furosemide (LASIX) 20 MG tablet, Take 1 tablet (20 mg total) by mouth daily., Disp: 30 tablet, Rfl: 0 .  potassium chloride (KLOR-CON) 10 MEQ tablet, Take 1 tablet (10 mEq total) by mouth daily., Disp: 30 tablet, Rfl: 0

## 2020-07-19 NOTE — Patient Instructions (Addendum)
Start flovent 280mcg 2 puffs twice daily (rinse mouth out with water after use)  Use albuterol as needed 1-2 puffs every 4-6 hours for shortness of breath, cough, wheezing or chest tightness  We will schedule you for split night sleep study

## 2020-07-26 DIAGNOSIS — F32A Depression, unspecified: Secondary | ICD-10-CM | POA: Diagnosis not present

## 2020-07-26 DIAGNOSIS — U071 COVID-19: Secondary | ICD-10-CM | POA: Diagnosis not present

## 2020-07-26 DIAGNOSIS — I5033 Acute on chronic diastolic (congestive) heart failure: Secondary | ICD-10-CM | POA: Diagnosis not present

## 2020-07-26 DIAGNOSIS — J45909 Unspecified asthma, uncomplicated: Secondary | ICD-10-CM | POA: Diagnosis not present

## 2020-07-26 DIAGNOSIS — E440 Moderate protein-calorie malnutrition: Secondary | ICD-10-CM | POA: Diagnosis not present

## 2020-07-26 DIAGNOSIS — E1165 Type 2 diabetes mellitus with hyperglycemia: Secondary | ICD-10-CM | POA: Diagnosis not present

## 2020-07-26 DIAGNOSIS — J1282 Pneumonia due to coronavirus disease 2019: Secondary | ICD-10-CM | POA: Diagnosis not present

## 2020-07-26 DIAGNOSIS — I82403 Acute embolism and thrombosis of unspecified deep veins of lower extremity, bilateral: Secondary | ICD-10-CM | POA: Diagnosis not present

## 2020-07-26 DIAGNOSIS — I110 Hypertensive heart disease with heart failure: Secondary | ICD-10-CM | POA: Diagnosis not present

## 2020-08-01 DIAGNOSIS — I82403 Acute embolism and thrombosis of unspecified deep veins of lower extremity, bilateral: Secondary | ICD-10-CM | POA: Diagnosis not present

## 2020-08-01 DIAGNOSIS — I110 Hypertensive heart disease with heart failure: Secondary | ICD-10-CM | POA: Diagnosis not present

## 2020-08-01 DIAGNOSIS — J45909 Unspecified asthma, uncomplicated: Secondary | ICD-10-CM | POA: Diagnosis not present

## 2020-08-01 DIAGNOSIS — E440 Moderate protein-calorie malnutrition: Secondary | ICD-10-CM | POA: Diagnosis not present

## 2020-08-01 DIAGNOSIS — F32A Depression, unspecified: Secondary | ICD-10-CM | POA: Diagnosis not present

## 2020-08-01 DIAGNOSIS — J1282 Pneumonia due to coronavirus disease 2019: Secondary | ICD-10-CM | POA: Diagnosis not present

## 2020-08-01 DIAGNOSIS — I5033 Acute on chronic diastolic (congestive) heart failure: Secondary | ICD-10-CM | POA: Diagnosis not present

## 2020-08-01 DIAGNOSIS — E1165 Type 2 diabetes mellitus with hyperglycemia: Secondary | ICD-10-CM | POA: Diagnosis not present

## 2020-08-01 DIAGNOSIS — U071 COVID-19: Secondary | ICD-10-CM | POA: Diagnosis not present

## 2020-08-02 DIAGNOSIS — E440 Moderate protein-calorie malnutrition: Secondary | ICD-10-CM | POA: Diagnosis not present

## 2020-08-02 DIAGNOSIS — I110 Hypertensive heart disease with heart failure: Secondary | ICD-10-CM | POA: Diagnosis not present

## 2020-08-02 DIAGNOSIS — U071 COVID-19: Secondary | ICD-10-CM | POA: Diagnosis not present

## 2020-08-02 DIAGNOSIS — J45909 Unspecified asthma, uncomplicated: Secondary | ICD-10-CM | POA: Diagnosis not present

## 2020-08-02 DIAGNOSIS — I5033 Acute on chronic diastolic (congestive) heart failure: Secondary | ICD-10-CM | POA: Diagnosis not present

## 2020-08-02 DIAGNOSIS — F32A Depression, unspecified: Secondary | ICD-10-CM | POA: Diagnosis not present

## 2020-08-02 DIAGNOSIS — J1282 Pneumonia due to coronavirus disease 2019: Secondary | ICD-10-CM | POA: Diagnosis not present

## 2020-08-02 DIAGNOSIS — I82403 Acute embolism and thrombosis of unspecified deep veins of lower extremity, bilateral: Secondary | ICD-10-CM | POA: Diagnosis not present

## 2020-08-02 DIAGNOSIS — E1165 Type 2 diabetes mellitus with hyperglycemia: Secondary | ICD-10-CM | POA: Diagnosis not present

## 2020-08-16 ENCOUNTER — Ambulatory Visit (HOSPITAL_COMMUNITY): Payer: 59 | Attending: Cardiology

## 2020-08-16 ENCOUNTER — Other Ambulatory Visit: Payer: Self-pay

## 2020-08-16 DIAGNOSIS — R0602 Shortness of breath: Secondary | ICD-10-CM | POA: Diagnosis present

## 2020-08-16 LAB — ECHOCARDIOGRAM COMPLETE
Area-P 1/2: 2.63 cm2
S' Lateral: 2.7 cm

## 2020-08-16 LAB — BRAIN NATRIURETIC PEPTIDE: BNP: 17 pg/mL (ref 0.0–100.0)

## 2020-08-27 ENCOUNTER — Other Ambulatory Visit: Payer: Self-pay

## 2020-08-27 ENCOUNTER — Ambulatory Visit (HOSPITAL_BASED_OUTPATIENT_CLINIC_OR_DEPARTMENT_OTHER): Payer: 59 | Attending: Pulmonary Disease | Admitting: Pulmonary Disease

## 2020-08-27 DIAGNOSIS — G4733 Obstructive sleep apnea (adult) (pediatric): Secondary | ICD-10-CM | POA: Insufficient documentation

## 2020-08-27 DIAGNOSIS — E119 Type 2 diabetes mellitus without complications: Secondary | ICD-10-CM | POA: Insufficient documentation

## 2020-08-27 DIAGNOSIS — G4736 Sleep related hypoventilation in conditions classified elsewhere: Secondary | ICD-10-CM | POA: Diagnosis not present

## 2020-09-05 DIAGNOSIS — G4733 Obstructive sleep apnea (adult) (pediatric): Secondary | ICD-10-CM

## 2020-09-05 NOTE — Procedures (Signed)
Patient Name: Clayton [name], Clayton [name] Date: 08/27/2020 Gender: Male D.O.B: [birth year] Age (years): 64 Referring Provider: Freda Jackson Height (inches): 72 Interpreting Physician: Kara Mead MD, ABSM Weight (lbs): 200 RPSGT: Gwenyth Allegra BMI: 30 MRN: [id number] Neck Size: 17.00 <br> <br> CLINICAL INFORMATION Sleep Study Type: NPSG    Indication for sleep study: Diabetes    Epworth Sleepiness Score: 6    SLEEP STUDY TECHNIQUE As per the AASM Manual for the Scoring of Sleep and Associated Events v2.3 (April 2016) with a hypopnea requiring 4% desaturations.  The channels recorded and monitored were frontal, central and occipital EEG, electrooculogram (EOG), submentalis EMG (chin), nasal and oral airflow, thoracic and abdominal wall motion, anterior tibialis EMG, snore microphone, electrocardiogram, and pulse oximetry.  MEDICATIONS Medications self-administered by patient taken the night of the study : N/A  SLEEP ARCHITECTURE The study was initiated at 10:57:57 PM and ended at 5:06:59 AM.  Sleep onset time was 36.3 minutes and the sleep efficiency was 74.5%%. The total sleep time was 275 minutes.  Stage REM latency was 161.5 minutes.  The patient spent 19.1%% of the night in stage N1 sleep, 58.9%% in stage N2 sleep, 0.0%% in stage N3 and 22% in REM.  Alpha intrusion was absent.  Supine sleep was 3.02%.  RESPIRATORY PARAMETERS The overall apnea/hypopnea index (AHI) was 7.2 per hour. There were 0 total apneas, including 0 obstructive, 0 central and 0 mixed apneas. There were 33 hypopneas and 8 RERAs.  The AHI during Stage REM sleep was 28.8 per hour.  AHI while supine was 50.6 per hour.  The mean oxygen saturation was 95.8%. The minimum SpO2 during sleep was 77.0%.  soft snoring was noted during this study.  CARDIAC DATA The 2 lead EKG demonstrated sinus rhythm. The mean heart rate was 72.2 beats per minute. Other EKG findings include: None. LEG MOVEMENT  DATA The total PLMS were 0 with a resulting PLMS index of 0.0. Associated arousal with leg movement index was 0.0 .  IMPRESSIONS - Mild obstructive sleep apnea occurred during this study (AHI = 7.2/h).This may be an under estimate since supine sleep was minimal - No significant central sleep apnea occurred during this study (CAI = 0.0/h). - Moderate oxygen desaturation was noted during this study (Min O2 = 77.0%). The study was performed on 1 L o2 - The patient snored with soft snoring volume. - No cardiac abnormalities were noted during this study. - Clinically significant periodic limb movements did not occur during sleep. No significant associated arousals.   DIAGNOSIS - Obstructive Sleep Apnea (G47.33) - Nocturnal Hypoxemia (G47.36) CORRECTED BY 1 l o2   RECOMMENDATIONS - Positional therapy avoiding supine position during sleep. - Very mild obstructive sleep apnea. Return to discuss treatment options. - Continue 1 L O2 during sleep. - Avoid alcohol, sedatives and other CNS depressants that may worsen sleep apnea and disrupt normal sleep architecture. - Sleep hygiene should be reviewed to assess factors that may improve sleep quality. - Weight management and regular exercise should be initiated or continued if appropriate.    Kara Mead MD Board Certified in [place]

## 2020-09-19 ENCOUNTER — Telehealth: Payer: Self-pay | Admitting: Pulmonary Disease

## 2020-09-19 DIAGNOSIS — G4733 Obstructive sleep apnea (adult) (pediatric): Secondary | ICD-10-CM

## 2020-09-19 NOTE — Telephone Encounter (Signed)
LMTCB.   Will route message to provider to see if results of sleep study are ready.

## 2020-09-20 NOTE — Telephone Encounter (Signed)
Attempted to call pt but unable to reach. Left message for him to return call. °

## 2020-09-20 NOTE — Telephone Encounter (Signed)
Patient has obstructive sleep apnea that ranges in severity based on his position of sleep and stage of sleep. He has more obstructions when he sleeps on his back and when he is in REM sleep.   I would recommend CPAP therapy. If he is open to this, then please order him a CPAP machine with auto titrating setting between 5-15cmH2O. Please set him up for a mask fitting as well with the DME company. He should use 1L of oxygen with the CPAP as well.   Thank you, Wille Glaser

## 2020-09-22 NOTE — Telephone Encounter (Signed)
Called and spoke with pt letting him know the results of HST and he verbalized understanding. Pt stated he was okay with [place] sending order for cpap start. Order has been placed. Pt is currently scheduled for f/u with JD 3/17 for a 3 month follow up. Stated to pt that we might need to reschedule that appt if he hasn't received cpap by then and he verbalized understanding. Nothing further needed.

## 2020-10-17 ENCOUNTER — Other Ambulatory Visit: Payer: Self-pay | Admitting: General Surgery

## 2020-10-17 DIAGNOSIS — K432 Incisional hernia without obstruction or gangrene: Secondary | ICD-10-CM

## 2020-10-19 ENCOUNTER — Other Ambulatory Visit: Payer: Self-pay

## 2020-10-19 ENCOUNTER — Telehealth: Payer: Self-pay | Admitting: Pulmonary Disease

## 2020-10-19 ENCOUNTER — Encounter: Payer: Self-pay | Admitting: Pulmonary Disease

## 2020-10-19 ENCOUNTER — Ambulatory Visit (INDEPENDENT_AMBULATORY_CARE_PROVIDER_SITE_OTHER): Payer: 59 | Admitting: Pulmonary Disease

## 2020-10-19 VITALS — BP 130/76 | HR 94 | Temp 98.4°F | Ht 68.0 in | Wt 226.0 lb

## 2020-10-19 DIAGNOSIS — J1282 Pneumonia due to coronavirus disease 2019: Secondary | ICD-10-CM | POA: Diagnosis not present

## 2020-10-19 DIAGNOSIS — J9611 Chronic respiratory failure with hypoxia: Secondary | ICD-10-CM

## 2020-10-19 DIAGNOSIS — U071 COVID-19: Secondary | ICD-10-CM

## 2020-10-19 DIAGNOSIS — G4733 Obstructive sleep apnea (adult) (pediatric): Secondary | ICD-10-CM | POA: Diagnosis not present

## 2020-10-19 NOTE — Patient Instructions (Addendum)
Continue supplemental oxygen  We will schedule you for a 6 minute walk test to be done on your baseline oxygen levels and determine any changes necessary.  We will schedule you for pulmonary function tests.  Please call use if you notice benefit from use of your flovent inhaler and we will send in refills.

## 2020-10-19 NOTE — Telephone Encounter (Signed)
Dr. Erin Fulling, please advise if you are okay with [place] placing order for pt to have a best fit eval for POC or if you think we should wait to get this placed until after pt has the 6 min walk test.

## 2020-10-19 NOTE — Progress Notes (Signed)
Synopsis: Referred by [name], NP for respiratory failure  Subjective:   PATIENT ID: Clayton [name] GENDER: male DOB: Jan 20, 1956, MRN: [id number]   HPI  Chief Complaint  Patient presents with  . Follow-up    3 month f/u. States he is still having problems with SOB with exertion. Using 1-2L of O2.    Joeseph Verville is a 65 year old male, never smoker with history of obstructive sleep apnea, obesity, diabetes mellitus type II and asthma who returns to pulmonary clinic for post-covid follow up.   He had sleep study 08/27/20 which showed mild obstructive sleep apnea with overall AHI of 7.2/hr but a REM AHI of 28.8/hr and supine AHI of 50/hr. He has been ordered a CPAP machine and mask fitting. He is still awaiting his CPAP machine.  He has completed home physical therapy. He continues to have exertional dyspnea and require supplemental oxygen 1-2 liters at rest and 3-4 liters with ambulation of supplemental oxygen.   He did not notice improvement with the flovent inhaler.   He remains out of work still.   OV 08/27/20: He was admitted 04/24/20 to 05/30/20 for respiratory failure in the setting of covid 19 pneumonia. He was also found to have bilateral DVT of his lower extremities in which he remains on eliquis. He is using 1-2 liters at rest and 3-4 liters with ambulation of supplemental oxygen.   Prior to covid he had a chronic cough. He reports history of having colds in the past with prolonged symptoms of cough, shortness of breathing and wheezing.  He was diagnosed with sleep apnea many years ago but never used CPAP therapy as he did not like the mask. He reports weighing more now than when he was previously tested. He required CPAP/Bipap during his admission and reports he would be willing to be tested for sleep apnea and try CPAP/Bipap therapy if needed now.   Overall he reports he is slowly getting better since his admission. He is finishing a steroid taper early next week.       Past Medical History:  Diagnosis Date  . Arthritis   . Asthma    pulmonary allergies- no asthma per pt  . Atrophic condition of skin   . Cellulitis/abscess - trunk   . Cyst    shoulder  . Diabetes mellitus without complication (Lead)   . Hyperlipidemia   . Hypertension   . Hypertrophic condition of skin   . Lipoma   . Obesity   . Pneumonia    hx child  . Sleep apnea    no cpap  . Stones in the urinary tract   . Vitamin D deficiency      Family History  Problem Relation Age of Onset  . Heart attack Father   . Heart disease Father        heart attack  . Allergies Mother   . Asthma Mother      Social History   Socioeconomic History  . Marital status: Married    Spouse name: Not on file  . Number of children: 1  . Years of education: Not on file  . Highest education level: Not on file  Occupational History  . Occupation: Chief of Staff: [employer]  Tobacco Use  . Smoking status: Never Smoker  . Smokeless tobacco: Never Used  Substance and Sexual Activity  . Alcohol use: Not Currently    Alcohol/week: 10.0 standard drinks    Types: 10 Cans of beer per  week  . Drug use: No  . Sexual activity: Not Currently  Other Topics Concern  . Not on file  Social History Narrative  . Not on file   Social Determinants of Health   Financial Resource Strain: Not on file  Food Insecurity: Not on file  Transportation Needs: Not on file  Physical Activity: Not on file  Stress: Not on file  Social Connections: Not on file  Intimate Partner Violence: Not on file     No Known Allergies   Outpatient Medications Prior to Visit  Medication Sig Dispense Refill  . acetaminophen (TYLENOL) 325 MG tablet Take 2 tablets (650 mg total) by mouth every 6 (six) hours as needed for fever.    Marland Kitchen albuterol (VENTOLIN HFA) 108 (90 Base) MCG/ACT inhaler Inhale 2 puffs into the lungs every 4 (four) hours as needed for wheezing or shortness of breath. 1 each 0  . Apixaban  (ELIQUIS PO) Take 1 tablet by mouth in the morning and at bedtime.    . clonazePAM (KLONOPIN) 0.5 MG tablet Take 0.5 mg by mouth 3 (three) times daily as needed.    Marland Kitchen ipratropium-albuterol (DUONEB) 0.5-2.5 (3) MG/3ML SOLN     . montelukast (SINGULAIR) 10 MG tablet Take 10 mg by mouth daily.    . sertraline (ZOLOFT) 100 MG tablet Take 100 mg by mouth daily.    Marland Kitchen SITagliptin Phosphate (JANUVIA PO) Take 1 tablet by mouth at bedtime.    . sodium chloride (OCEAN) 0.65 % SOLN nasal spray Place 1 spray into both nostrils as needed for congestion (nose irritation, give 1st for congestion). 30 mL 0  . traZODone (DESYREL) 50 MG tablet     . fluticasone (FLOVENT HFA) 220 MCG/ACT inhaler Inhale 2 puffs into the lungs in the morning and at bedtime. 1 each 12  . furosemide (LASIX) 20 MG tablet Take 1 tablet (20 mg total) by mouth daily. 30 tablet 0  . potassium chloride (KLOR-CON) 10 MEQ tablet Take 1 tablet (10 mEq total) by mouth daily. 30 tablet 0  . predniSONE (DELTASONE) 10 MG tablet Take 2 tablets daily for five days, then 1 tablet daily for five days, then half tablet daily for five days, then stop. 18 tablet 0   No facility-administered medications prior to visit.    Review of Systems  Constitutional: Negative for chills, fever, malaise/fatigue and weight loss.  HENT: Negative for congestion, sinus pain and sore throat.   Eyes: Negative.   Respiratory: Positive for cough and shortness of breath. Negative for hemoptysis, sputum production and wheezing.   Cardiovascular: Negative for chest pain, palpitations, orthopnea, claudication, leg swelling and PND.  Gastrointestinal: Negative for abdominal pain, heartburn, nausea and vomiting.  Genitourinary: Negative.   Musculoskeletal: Negative.   Neurological: Negative for dizziness, weakness and headaches.  Psychiatric/Behavioral: Negative.    Objective:   Vitals:   10/19/20 1418  BP: 130/76  Pulse: 94  Temp: 98.4 F (36.9 C)  TempSrc: Temporal   SpO2: 97%  Weight: 226 lb (102.5 kg)  Height: 5\' 8"  (1.727 m)    Physical Exam Constitutional:      General: He is not in acute distress.    Appearance: He is obese.  HENT:     Head: Normocephalic and atraumatic.     Nose: Nose normal.     Mouth/Throat:     Mouth: Mucous membranes are moist.     Pharynx: Oropharynx is clear.  Eyes:     General: No scleral icterus.  Conjunctiva/sclera: Conjunctivae normal.     Pupils: Pupils are equal, round, and reactive to light.  Cardiovascular:     Rate and Rhythm: Normal rate and regular rhythm.     Pulses: Normal pulses.     Heart sounds: Normal heart sounds. No murmur heard.   Pulmonary:     Effort: Pulmonary effort is normal.     Breath sounds: No wheezing, rhonchi or rales.  Abdominal:     General: Bowel sounds are normal.     Palpations: Abdomen is soft.  Musculoskeletal:     Right lower leg: No edema.     Left lower leg: No edema.  Skin:    General: Skin is warm and dry.     Capillary Refill: Capillary refill takes less than 2 seconds.  Neurological:     General: No focal deficit present.     Mental Status: He is alert.  Psychiatric:        Mood and Affect: Mood normal.        Behavior: Behavior normal.        Thought Content: Thought content normal.        Judgment: Judgment normal.    CBC    Component Value Date/Time   WBC 10.3 06/18/2020 0454   RBC 3.45 (L) 06/18/2020 0454   HGB 10.6 (L) 06/18/2020 0454   HCT 33.6 (L) 06/18/2020 0454   PLT 274 06/18/2020 0454   MCV 97.4 06/18/2020 0454   MCH 30.7 06/18/2020 0454   MCHC 31.5 06/18/2020 0454   RDW 17.8 (H) 06/18/2020 0454   LYMPHSABS 1.0 05/31/2020 0423   MONOABS 0.8 05/31/2020 0423   EOSABS 0.1 05/31/2020 0423   BASOSABS 0.0 05/31/2020 0423   BMP Latest Ref Rng & Units 06/19/2020 06/18/2020 06/16/2020  Glucose 70 - 99 mg/dL - 85 97  BUN 8 - 23 mg/dL - 9 12  Creatinine 0.61 - 1.24 mg/dL - 0.52(L) 0.56(L)  Sodium 135 - 145 mmol/L - 140 141  Potassium  3.5 - 5.1 mmol/L 4.1 3.4(L) 4.3  Chloride 98 - 111 mmol/L - 102 101  CO2 22 - 32 mmol/L - 30 31  Calcium 8.9 - 10.3 mg/dL - 8.7(L) 9.1   Chest imaging: CXR 06/15/2020 Stable low volume chest with diffuse coarse interstitial opacities, 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 No visible effusion or air leak. Stable heart size which is accentuated by technique.  PFT: PFT Results Latest Ref Rng & Units 10/24/2020  FVC-Pre L 2.67  FVC-Predicted Pre % 61  FVC-Post L 2.66  FVC-Predicted Post % 61  Pre FEV1/FVC % % 85  Post FEV1/FCV % % 89  FEV1-Pre L 2.28  FEV1-Predicted Pre % 70  FEV1-Post L 2.36  DLCO uncorrected ml/min/mmHg 20.61  DLCO UNC% % 80  DLCO corrected ml/min/mmHg 20.61  DLCO COR %Predicted % 80  DLVA Predicted % 121  TLC L 4.20  TLC % Predicted % 63  RV % Predicted % 72    Echo: 05/02/20 1. Limited study with poor echo windows, Definity contrast given  2. Left ventricular ejection fraction, by estimation, is 65 to 70%. The  left ventricle has normal function. The left ventricle has no regional  wall motion abnormalities. There is mild left ventricular hypertrophy.  Left ventricular diastolic parameters  are consistent with Grade I diastolic dysfunction (impaired relaxation).  3. The aortic valve is tricuspid. Aortic valve regurgitation is not  visualized. Mild aortic valve sclerosis is present, with no evidence of  aortic valve stenosis.  Lower extremity Doppler US 04/28/20 RIGHT:  - Findings consistent with acute deep vein thrombosis involving the right  posterior tibial veins, and right peroneal veins.  - No cystic structure found in the popliteal fossa.    LEFT:  - Findings consistent with acute deep vein thrombosis involving the left  posterior tibial veins, and left soleal veins.  - No cystic structure found in the popliteal fossa.   Sleep Study 08/27/20   Overall AHI 7.2/hr REM AHI 28.8/hr  Supine AHI 50.6/hr Min SpO2 77%  Assessment & Plan:    Chronic respiratory failure with hypoxia (HCC) - Plan: 6 minute walk  OSA (obstructive sleep apnea)  Pneumonia due to COVID-19 virus - Plan: Pulmonary function test  Discussion: Clayton [name] is a 65 year old male, never smoker with history of obstructive sleep apnea, obesity, diabetes mellitus type II and asthma who returns to pulmonary clinic for post-covid follow up.   He has chronic hypoxemic respiratory failure in setting of recent covid 19 pneumonia. He is to continue supplemental oxygen at this time.   Pulmonary function testing shows moderate restrictive defect but overall poor study quality as he was not able to participate in the breathing maneuvers consistently due to coughing.   He would benefit from pulmonary rehab. Will discuss this option with him.   He is to continue eliquis therapy for the DVT.   Freda Jackson, MD [facility] Office: [phone]    Current Outpatient Medications:  .  acetaminophen (TYLENOL) 325 MG tablet, Take 2 tablets (650 mg total) by mouth every 6 (six) hours as needed for fever., Disp: , Rfl:  .  albuterol (VENTOLIN HFA) 108 (90 Base) MCG/ACT inhaler, Inhale 2 puffs into the lungs every 4 (four) hours as needed for wheezing or shortness of breath., Disp: 1 each, Rfl: 0 .  Apixaban (ELIQUIS PO), Take 1 tablet by mouth in the morning and at bedtime., Disp: , Rfl:  .  clonazePAM (KLONOPIN) 0.5 MG tablet, Take 0.5 mg by mouth 3 (three) times daily as needed., Disp: , Rfl:  .  ipratropium-albuterol (DUONEB) 0.5-2.5 (3) MG/3ML SOLN, , Disp: , Rfl:  .  montelukast (SINGULAIR) 10 MG tablet, Take 10 mg by mouth daily., Disp: , Rfl:  .  sertraline (ZOLOFT) 100 MG tablet, Take 100 mg by mouth daily., Disp: , Rfl:  .  SITagliptin Phosphate (JANUVIA PO), Take 1 tablet by mouth at bedtime., Disp: , Rfl:  .  sodium chloride (OCEAN) 0.65 % SOLN nasal spray, Place 1 spray into both nostrils as needed for congestion (nose irritation,  give 1st for congestion)., Disp: 30 mL, Rfl: 0 .  traZODone (DESYREL) 50 MG tablet, , Disp: , Rfl:  .  furosemide (LASIX) 20 MG tablet, Take 1 tablet (20 mg total) by mouth daily., Disp: 30 tablet, Rfl: 0 .  potassium chloride (KLOR-CON) 10 MEQ tablet, Take 1 tablet (10 mEq total) by mouth daily., Disp: 30 tablet, Rfl: 0

## 2020-10-19 NOTE — Telephone Encounter (Signed)
Yes, ok to order portoable oxygen concentrator for this patient.  Thanks, Wille Glaser

## 2020-10-20 ENCOUNTER — Ambulatory Visit
Admission: RE | Admit: 2020-10-20 | Discharge: 2020-10-20 | Disposition: A | Discharge status: Home / Self Care | Payer: 59 | Source: Ambulatory Visit | Attending: General Surgery | Admitting: General Surgery

## 2020-10-20 DIAGNOSIS — K432 Incisional hernia without obstruction or gangrene: Secondary | ICD-10-CM

## 2020-10-20 NOTE — Telephone Encounter (Signed)
Spoke with Kenney Houseman (pt's wife: per DPR) and informed Dr. Renella Cunas ok's POC order and order would be placed today. Pt's wife states understanding. Order placed with [employer]. Nothing further needed at this time.

## 2020-10-21 ENCOUNTER — Other Ambulatory Visit (HOSPITAL_COMMUNITY)
Admission: RE | Admit: 2020-10-21 | Discharge: 2020-10-21 | Disposition: A | Discharge status: Home / Self Care | Payer: 59 | Source: Ambulatory Visit | Attending: Pulmonary Disease | Admitting: Pulmonary Disease

## 2020-10-21 DIAGNOSIS — Z01812 Encounter for preprocedural laboratory examination: Secondary | ICD-10-CM | POA: Diagnosis present

## 2020-10-21 DIAGNOSIS — Z20822 Contact with and (suspected) exposure to covid-19: Secondary | ICD-10-CM | POA: Diagnosis not present

## 2020-10-21 LAB — SARS CORONAVIRUS 2 (TAT 6-24 HRS): SARS Coronavirus 2: NEGATIVE

## 2020-10-24 ENCOUNTER — Other Ambulatory Visit: Payer: Self-pay

## 2020-10-24 ENCOUNTER — Ambulatory Visit (INDEPENDENT_AMBULATORY_CARE_PROVIDER_SITE_OTHER): Payer: 59 | Admitting: Pulmonary Disease

## 2020-10-24 DIAGNOSIS — J1282 Pneumonia due to coronavirus disease 2019: Secondary | ICD-10-CM

## 2020-10-24 DIAGNOSIS — U071 COVID-19: Secondary | ICD-10-CM | POA: Diagnosis not present

## 2020-10-24 LAB — PULMONARY FUNCTION TEST
DL/VA % pred: 121 %
DL/VA: 5.13 ml/min/mmHg/L
DLCO cor % pred: 80 %
DLCO cor: 20.61 ml/min/mmHg
DLCO unc % pred: 80 %
DLCO unc: 20.61 ml/min/mmHg
FEF 25-75 Post: 3.2 L/sec
FEF 25-75 Pre: 2.37 L/sec
FEF2575-%Change-Post: 34 %
FEF2575-%Pred-Post: 121 %
FEF2575-%Pred-Pre: 89 %
FEV1-%Change-Post: 3 %
FEV1-%Pred-Post: 72 %
FEV1-%Pred-Pre: 70 %
FEV1-Post: 2.36 L
FEV1-Pre: 2.28 L
FEV1FVC-%Change-Post: 3 %
FEV1FVC-%Pred-Pre: 113 %
FEV6-%Change-Post: 0 %
FEV6-%Pred-Post: 64 %
FEV6-%Pred-Pre: 64 %
FEV6-Post: 2.66 L
FEV6-Pre: 2.67 L
FEV6FVC-%Pred-Post: 105 %
FEV6FVC-%Pred-Pre: 105 %
FVC-%Change-Post: 0 %
FVC-%Pred-Post: 61 %
FVC-%Pred-Pre: 61 %
FVC-Post: 2.66 L
FVC-Pre: 2.67 L
Post FEV1/FVC ratio: 89 %
Post FEV6/FVC ratio: 100 %
Pre FEV1/FVC ratio: 85 %
Pre FEV6/FVC Ratio: 100 %
RV % pred: 72 %
RV: 1.59 L
TLC % pred: 63 %
TLC: 4.2 L

## 2020-10-24 NOTE — Progress Notes (Signed)
Full PFT performed today. °

## 2020-10-25 ENCOUNTER — Encounter: Payer: Self-pay | Admitting: Pulmonary Disease

## 2020-10-27 ENCOUNTER — Other Ambulatory Visit: Payer: Self-pay

## 2020-10-27 ENCOUNTER — Ambulatory Visit (INDEPENDENT_AMBULATORY_CARE_PROVIDER_SITE_OTHER): Payer: 59

## 2020-10-27 DIAGNOSIS — J9611 Chronic respiratory failure with hypoxia: Secondary | ICD-10-CM

## 2020-10-27 NOTE — Progress Notes (Signed)
Six Minute Walk - 10/27/20 1337      Six Minute Walk   Medications taken before test (dose and time) Sertaline 100mg , atorvastatin, clonazepam and Januvia.    Supplemental oxygen during test? No    Lap distance in meters  34 meters    Laps Completed 13    Partial lap (in meters) 0 meters    Baseline BP (sitting) 128/82    Baseline Heartrate 82    Baseline Dyspnea (Borg Scale) 3    Baseline Fatigue (Borg Scale) 4    Baseline SPO2 96 %      End of Test Values    BP (sitting) 132/88    Heartrate 92    Dyspnea (Borg Scale) 5    Fatigue (Borg Scale) 7    SPO2 92 %      2 Minutes Post Walk Values   BP (sitting) 132/86    Heartrate 87    SPO2 95 %    Stopped or paused before six minutes? Yes    Reason: Patient stopped with 40mins left. O2 was 94%, HR 95. Patient stopped due to feeling very tired and SOB. He stooped for about 5 minutes to regroup. He wanted to finish the walk. No O2 was used.      Interpretation   Distance completed 442 meters    Tech Comments: Patient was able to walk at a steady pace. He was able to complete the walk without any O2. Patient did stop at the 4 min mark but was able to restart and finish the walk.

## 2020-12-11 ENCOUNTER — Ambulatory Visit (INDEPENDENT_AMBULATORY_CARE_PROVIDER_SITE_OTHER): Payer: 59 | Admitting: Pulmonary Disease

## 2020-12-11 ENCOUNTER — Other Ambulatory Visit: Payer: Self-pay

## 2020-12-11 ENCOUNTER — Encounter: Payer: Self-pay | Admitting: Pulmonary Disease

## 2020-12-11 VITALS — BP 128/84 | HR 101 | Temp 98.2°F | Ht 68.0 in | Wt 232.6 lb

## 2020-12-11 DIAGNOSIS — I82403 Acute embolism and thrombosis of unspecified deep veins of lower extremity, bilateral: Secondary | ICD-10-CM

## 2020-12-11 DIAGNOSIS — U071 COVID-19: Secondary | ICD-10-CM

## 2020-12-11 DIAGNOSIS — J9611 Chronic respiratory failure with hypoxia: Secondary | ICD-10-CM | POA: Diagnosis not present

## 2020-12-11 DIAGNOSIS — R0602 Shortness of breath: Secondary | ICD-10-CM

## 2020-12-11 DIAGNOSIS — J984 Other disorders of lung: Secondary | ICD-10-CM

## 2020-12-11 DIAGNOSIS — G4733 Obstructive sleep apnea (adult) (pediatric): Secondary | ICD-10-CM | POA: Diagnosis not present

## 2020-12-11 DIAGNOSIS — J1282 Pneumonia due to coronavirus disease 2019: Secondary | ICD-10-CM

## 2020-12-11 NOTE — Progress Notes (Signed)
Synopsis: Referred by [name], NP for respiratory failure  Subjective:   PATIENT ID: Clayton [name] GENDER: male DOB: February 17, 1956, MRN: [id number]  HPI  Chief Complaint  Patient presents with  . Follow-up    2 mo f/u after PFT and 66mw. States he has been doing well. Still noticing some SOB with exertion going up stairs. Currently only using 1L of O2.    Clayton [name] is a 65 year old male, never smoker with history of obstructive sleep apnea, obesity, diabetes mellitus type II, asthma and chronic hypoxemic respiratory failure after covid 19 pneumonia 05/2020 who returns to pulmonary clinic follow up.   He has been weaned down to 1L of supplemental oxygen throughout the day and night. He received his cpap machine recently and will be starting that tonight. He continues to experience exertional dyspnea especially with stairs otherwise he reports he continues to notice some improvement in his shortness of breath.  His PFTs 10/24/20 showed showed mild restrictive defect.   Past Medical History:  Diagnosis Date  . Arthritis   . Asthma    pulmonary allergies- no asthma per pt  . Atrophic condition of skin   . Cellulitis/abscess - trunk   . Cyst    shoulder  . Diabetes mellitus without complication ([place])   . Hyperlipidemia   . Hypertension   . Hypertrophic condition of skin   . Lipoma   . Obesity   . Pneumonia    hx child  . Sleep apnea    no cpap  . Stones in the urinary tract   . Vitamin D deficiency      Family History  Problem Relation Age of Onset  . Heart attack Father   . Heart disease Father        heart attack  . Allergies Mother   . Asthma Mother      Social History   Socioeconomic History  . Marital status: Married    Spouse name: Not on file  . Number of children: 1  . Years of education: Not on file  . Highest education level: Not on file  Occupational History  . Occupation: Chief of Staff: [employer]  Tobacco Use  . Smoking status:  Never Smoker  . Smokeless tobacco: Never Used  Substance and Sexual Activity  . Alcohol use: Not Currently    Alcohol/week: 10.0 standard drinks    Types: 10 Cans of beer per week  . Drug use: No  . Sexual activity: Not Currently  Other Topics Concern  . Not on file  Social History Narrative  . Not on file   Social Determinants of Health   Financial Resource Strain: Not on file  Food Insecurity: Not on file  Transportation Needs: Not on file  Physical Activity: Not on file  Stress: Not on file  Social Connections: Not on file  Intimate Partner Violence: Not on file     No Known Allergies   Outpatient Medications Prior to Visit  Medication Sig Dispense Refill  . acetaminophen (TYLENOL) 325 MG tablet Take 2 tablets (650 mg total) by mouth every 6 (six) hours as needed for fever.    Marland Kitchen albuterol (VENTOLIN HFA) 108 (90 Base) MCG/ACT inhaler Inhale 2 puffs into the lungs every 4 (four) hours as needed for wheezing or shortness of breath. 1 each 0  . Apixaban (ELIQUIS PO) Take 1 tablet by mouth in the morning and at bedtime.    . clonazePAM (KLONOPIN) 0.5 MG tablet Take  0.5 mg by mouth 3 (three) times daily as needed.    Marland Kitchen ipratropium-albuterol (DUONEB) 0.5-2.5 (3) MG/3ML SOLN     . montelukast (SINGULAIR) 10 MG tablet Take 10 mg by mouth daily.    . sertraline (ZOLOFT) 100 MG tablet Take 100 mg by mouth daily.    Marland Kitchen SITagliptin Phosphate (JANUVIA PO) Take 1 tablet by mouth at bedtime.    . sodium chloride (OCEAN) 0.65 % SOLN nasal spray Place 1 spray into both nostrils as needed for congestion (nose irritation, give 1st for congestion). 30 mL 0  . terbinafine (LAMISIL) 250 MG tablet Take 250 mg by mouth daily.    . traZODone (DESYREL) 50 MG tablet     . furosemide (LASIX) 20 MG tablet Take 1 tablet (20 mg total) by mouth daily. 30 tablet 0  . potassium chloride (KLOR-CON) 10 MEQ tablet Take 1 tablet (10 mEq total) by mouth daily. 30 tablet 0   No facility-administered medications  prior to visit.    Review of Systems  Constitutional: Negative for chills, fever, malaise/fatigue and weight loss.  HENT: Negative for congestion, sinus pain and sore throat.   Eyes: Negative.   Respiratory: Positive for shortness of breath. Negative for cough, hemoptysis, sputum production and wheezing.   Cardiovascular: Negative for chest pain, palpitations, orthopnea, claudication, leg swelling and PND.  Gastrointestinal: Negative for abdominal pain, heartburn, nausea and vomiting.  Genitourinary: Negative.   Musculoskeletal: Negative.   Neurological: Negative for dizziness, weakness and headaches.  Psychiatric/Behavioral: Negative.    Objective:   Vitals:   12/11/20 1417  BP: 128/84  Pulse: (!) 101  Temp: 98.2 F (36.8 C)  TempSrc: Temporal  SpO2: 96%  Weight: 232 lb 9.6 oz (105.5 kg)  Height: 5\' 8"  (1.727 m)    Physical Exam Constitutional:      General: He is not in acute distress.    Appearance: He is obese.  HENT:     Head: Normocephalic and atraumatic.     Nose: Nose normal.     Mouth/Throat:     Mouth: Mucous membranes are moist.     Pharynx: Oropharynx is clear.  Eyes:     General: No scleral icterus.    Conjunctiva/sclera: Conjunctivae normal.     Pupils: Pupils are equal, round, and reactive to light.  Cardiovascular:     Rate and Rhythm: Normal rate and regular rhythm.     Pulses: Normal pulses.     Heart sounds: Normal heart sounds. No murmur heard.   Pulmonary:     Effort: Pulmonary effort is normal.     Breath sounds: No wheezing, rhonchi or rales.  Abdominal:     General: Bowel sounds are normal.     Palpations: Abdomen is soft.  Musculoskeletal:     Right lower leg: No edema.     Left lower leg: No edema.  Skin:    General: Skin is warm and dry.     Capillary Refill: Capillary refill takes less than 2 seconds.  Neurological:     General: No focal deficit present.     Mental Status: He is alert.  Psychiatric:        Mood and Affect:  Mood normal.        Behavior: Behavior normal.        Thought Content: Thought content normal.        Judgment: Judgment normal.    CBC    Component Value Date/Time   WBC 10.3 06/18/2020 0454  RBC 3.45 (L) 06/18/2020 0454   HGB 10.6 (L) 06/18/2020 0454   HCT 33.6 (L) 06/18/2020 0454   PLT 274 06/18/2020 0454   MCV 97.4 06/18/2020 0454   MCH 30.7 06/18/2020 0454   MCHC 31.5 06/18/2020 0454   RDW 17.8 (H) 06/18/2020 0454   LYMPHSABS 1.0 05/31/2020 0423   MONOABS 0.8 05/31/2020 0423   EOSABS 0.1 05/31/2020 0423   BASOSABS 0.0 05/31/2020 0423   BMP Latest Ref Rng & Units 06/19/2020 06/18/2020 06/16/2020  Glucose 70 - 99 mg/dL - 85 97  BUN 8 - 23 mg/dL - 9 12  Creatinine 0.61 - 1.24 mg/dL - 0.52(L) 0.56(L)  Sodium 135 - 145 mmol/L - 140 141  Potassium 3.5 - 5.1 mmol/L 4.1 3.4(L) 4.3  Chloride 98 - 111 mmol/L - 102 101  CO2 22 - 32 mmol/L - 30 31  Calcium 8.9 - 10.3 mg/dL - 8.7(L) 9.1   Chest imaging: CXR 06/15/2020 Stable low volume chest with diffuse coarse interstitial opacities, 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 No visible effusion or air leak. Stable heart size which is accentuated by technique.  PFT: PFT Results Latest Ref Rng & Units 10/24/2020  FVC-Pre L 2.67  FVC-Predicted Pre % 61  FVC-Post L 2.66  FVC-Predicted Post % 61  Pre FEV1/FVC % % 85  Post FEV1/FCV % % 89  FEV1-Pre L 2.28  FEV1-Predicted Pre % 70  FEV1-Post L 2.36  DLCO uncorrected ml/min/mmHg 20.61  DLCO UNC% % 80  DLCO corrected ml/min/mmHg 20.61  DLCO COR %Predicted % 80  DLVA Predicted % 121  TLC L 4.20  TLC % Predicted % 63  RV % Predicted % 72  PFTs 2022: Mild restrictive defect  Echo: 05/02/20 1. Limited study with poor echo windows, Definity contrast given  2. Left ventricular ejection fraction, by estimation, is 65 to 70%. The  left ventricle has normal function. The left ventricle has no regional  wall motion abnormalities. There is mild left ventricular hypertrophy.  Left  ventricular diastolic parameters  are consistent with Grade I diastolic dysfunction (impaired relaxation).  3. The aortic valve is tricuspid. Aortic valve regurgitation is not  visualized. Mild aortic valve sclerosis is present, with no evidence of  aortic valve stenosis.   Lower extremity Doppler US 04/28/20 RIGHT:  - Findings consistent with acute deep vein thrombosis involving the right  posterior tibial veins, and right peroneal veins.  - No cystic structure found in the popliteal fossa.    LEFT:  - Findings consistent with acute deep vein thrombosis involving the left  posterior tibial veins, and left soleal veins.  - No cystic structure found in the popliteal fossa.   Sleep Study 08/27/20   Overall AHI 7.2/hr REM AHI 28.8/hr  Supine AHI 50.6/hr Min SpO2 77%  Assessment & Plan:   Restrictive lung disease - Plan: CT Chest High Resolution, AMB referral to pulmonary rehabilitation  Chronic respiratory failure with hypoxia ([place])  Pneumonia due to COVID-19 virus - Plan: AMB referral to pulmonary rehabilitation  OSA (obstructive sleep apnea)  Deep vein thrombosis (DVT) of both lower extremities, unspecified chronicity, unspecified vein (HCC)  Shortness of breath - Plan: CT Chest High Resolution  Discussion: Clayton [name] is a 65 year old male, never smoker with history of obstructive sleep apnea, obesity, diabetes mellitus type II, asthma and chronic hypoxemic respiratory failure after covid 19 pneumonia 05/2020 who returns to pulmonary clinic follow up.   On simple walk in clinic today he was able to walk 2 laps (  588ft) on room air without the SpO2 dropping below 88%.   He is safe to stop supplemental oxygen at this time. I have encouraged him to be referred to pulmonary rehab and he is amenable at this time. He is to increase his physical activity as tolerated.  We will schedule him for a high resolution CT chest scan to evaluate for any post-covid fibrotic changes.    He is to continue eliquis therapy for the DVT.   Freda Jackson, MD [facility] Office: [phone]    Current Outpatient Medications:  .  acetaminophen (TYLENOL) 325 MG tablet, Take 2 tablets (650 mg total) by mouth every 6 (six) hours as needed for fever., Disp: , Rfl:  .  albuterol (VENTOLIN HFA) 108 (90 Base) MCG/ACT inhaler, Inhale 2 puffs into the lungs every 4 (four) hours as needed for wheezing or shortness of breath., Disp: 1 each, Rfl: 0 .  Apixaban (ELIQUIS PO), Take 1 tablet by mouth in the morning and at bedtime., Disp: , Rfl:  .  clonazePAM (KLONOPIN) 0.5 MG tablet, Take 0.5 mg by mouth 3 (three) times daily as needed., Disp: , Rfl:  .  ipratropium-albuterol (DUONEB) 0.5-2.5 (3) MG/3ML SOLN, , Disp: , Rfl:  .  montelukast (SINGULAIR) 10 MG tablet, Take 10 mg by mouth daily., Disp: , Rfl:  .  sertraline (ZOLOFT) 100 MG tablet, Take 100 mg by mouth daily., Disp: , Rfl:  .  SITagliptin Phosphate (JANUVIA PO), Take 1 tablet by mouth at bedtime., Disp: , Rfl:  .  sodium chloride (OCEAN) 0.65 % SOLN nasal spray, Place 1 spray into both nostrils as needed for congestion (nose irritation, give 1st for congestion)., Disp: 30 mL, Rfl: 0 .  terbinafine (LAMISIL) 250 MG tablet, Take 250 mg by mouth daily., Disp: , Rfl:  .  traZODone (DESYREL) 50 MG tablet, , Disp: , Rfl:  .  furosemide (LASIX) 20 MG tablet, Take 1 tablet (20 mg total) by mouth daily., Disp: 30 tablet, Rfl: 0 .  potassium chloride (KLOR-CON) 10 MEQ tablet, Take 1 tablet (10 mEq total) by mouth daily., Disp: 30 tablet, Rfl: 0

## 2020-12-11 NOTE — Patient Instructions (Signed)
We will schedule you for a CT chest in early September with a follow up visit soon after  We will refer you to pulmonary rehab  Continue to increase your physical activity as able.

## 2020-12-12 ENCOUNTER — Encounter (HOSPITAL_COMMUNITY): Payer: Self-pay | Admitting: *Deleted

## 2020-12-12 NOTE — Progress Notes (Addendum)
Received referral from Dr. 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 Pt hospitalized 9/20 - 10/26 and upon discharge to [facility] for continued rehab and recovery. Medical Records show persistent symptoms that include respiratory dysfunction for greater than 4 weeks which include prolonged cough, shortness of breath, wheezing and chronic hypoxemic respiratory failure in the setting of recent covid 19 pneumonia.  Clinical review of pt follow up appt on 5/9 Pulmonary office note. Also reviewed cardiology follow up appt with Dr. Marisue Ivan for 07/24/2020. Pt with Covid Risk Score - 6. Pt appropriate for scheduling for Pulmonary rehab.  Will forward to support staff for scheduling and verification of insurance eligibility/benefits with pt consent. Cherre Huger, BSN Cardiac and Training and development officer

## 2020-12-21 ENCOUNTER — Encounter: Payer: Self-pay | Admitting: Pulmonary Disease

## 2021-01-10 ENCOUNTER — Telehealth: Payer: Self-pay | Admitting: Pulmonary Disease

## 2021-01-10 NOTE — Telephone Encounter (Signed)
Called and spoke with pt about insurance denying his O2. Pt said that he received letter in mail from insurance stating that due to them not having info showing that he met qualifying data for his O2, they are denying it.  Looked back at pt's last OV 5/9 and I saw where it was showing pt was walked on the 1L O2. Pt said that the first two laps were done on RA and sats were 90-91% on RA. Told pt that in order for him to qualify for his O2, he needed to be 88% or lower for insurance to pay for his O2. Stated to pt that we could get him in for another OV and rewalk him at that [place] to see if we could get the data we needed that shows that he does qualify for the O2 and pt said that would be good.  Pt has been scheduled for an OV with Rexene Edison, NP 6/13 and note was put that pt will need to have qualifying walk for O2. Nothing further needed.

## 2021-01-10 NOTE — Progress Notes (Signed)
Cardiology Office Note:   Date:  01/12/2021  NAME:  Clayton [name]    MRN: [id number] DOB:  1956-02-28   PCP:  Kristen Loader, FNP  Cardiologist:  None  Electrophysiologist:  None   Referring MD: Kristen Loader, FNP   Chief Complaint  Patient presents with   Follow-up    History of Present Illness:   Clayton [name] is a 65 y.o. male with a hx of DM, HLD, covid PNA/restrictive lung dz who presents for follow-up. Seen by me and findings were inconsistent with CHF. Found to have restrictive lung dz on PFTs.  He reports he is doing fairly well.  He is still having shortness of breath with exertion.  He has stopped taking Lasix.  He has no evidence of congestive heart failure.  He does not need to take this moving forward.  He reports that his oxygen levels stay stable but he just gets profoundly short of breath and is requiring oxygen.  He is working with his [facility] on this.  He is also suffering from depression and anxiety given all of his medical ailments.  I do sympathize with him.  His blood pressure is 152/80.  It appears to be stable at home.  He has restarted his Lipitor.  This is a good idea given his diabetes.  His diabetes is well controlled.  He denies any chest pain.  No lower extremity edema.  Again, all of his testing was inconsistent with congestive heart failure.   Problem List 1. DM -A1c 6.1 2. OSA 3. Covid 19 PNA/restrictive lung dz -admit 05/2020 for severe PNA/covid 4. 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 HLD -T chol 169, HDL 32, LDL 112, TG 123  Past Medical History: Past Medical History:  Diagnosis Date   Arthritis    Asthma    pulmonary allergies- no asthma per pt   Atrophic condition of skin    Cellulitis/abscess - trunk    Cyst    shoulder   Diabetes mellitus without complication (HCC)    Hyperlipidemia    Hypertension    Hypertrophic condition of skin    Lipoma    Obesity    Pneumonia    hx child   Sleep apnea    no cpap   Stones in the  urinary tract    Vitamin D deficiency     Past Surgical History: Past Surgical History:  Procedure Laterality Date   cyst removed from [place] N/A 05/23/2015   Procedure: INSERTION OF MESH;  Surgeon: [name], MD;  Location: [facility];  Service: General;  Laterality: N/A;   MASS EXCISION  07/15/11   left shoulder mass    NASAL SINUS SURGERY---UVULA REMOVED  2004   Dr. Wilburn Cornelia   PROSTATE BIOPSY  9/16   TONSILLECTOMY     UMBILICAL HERNIA REPAIR N/A 05/23/2015   Procedure: LAPAROSCOPIC UMBILICAL HERNIA REPAIR ;  Surgeon: [name], MD;  Location: [facility];  Service: General;  Laterality: N/A;    Current Medications: Current Meds  Medication Sig   acetaminophen (TYLENOL) 325 MG tablet Take 2 tablets (650 mg total) by mouth every 6 (six) hours as needed for fever.   albuterol (VENTOLIN HFA) 108 (90 Base) MCG/ACT inhaler Inhale 2 puffs into the lungs every 4 (four) hours as needed for wheezing or shortness of breath.   Apixaban (ELIQUIS PO) Take 1 tablet by mouth in the morning and at bedtime.   atorvastatin (LIPITOR) 40 MG  tablet Take 1 tablet by mouth daily.   clonazePAM (KLONOPIN) 0.5 MG tablet Take 0.5 mg by mouth 3 (three) times daily as needed.   ipratropium-albuterol (DUONEB) 0.5-2.5 (3) MG/3ML SOLN    metFORMIN (GLUCOPHAGE-XR) 750 MG 24 hr tablet metformin ER 750 mg tablet,extended release 24 hr   metoprolol tartrate (LOPRESSOR) 25 MG tablet metoprolol tartrate 25 mg tablet   montelukast (SINGULAIR) 10 MG tablet Take 10 mg by mouth daily.   sertraline (ZOLOFT) 100 MG tablet Take 100 mg by mouth daily.   sildenafil (REVATIO) 20 MG tablet SMARTSIG:1-5 Tablet(s) By Mouth Daily PRN   sitaGLIPtin (JANUVIA) 100 MG tablet Take 1 tablet by mouth daily.   SITagliptin Phosphate (JANUVIA PO) Take 1 tablet by mouth at bedtime.   sodium chloride (OCEAN) 0.65 % SOLN nasal spray Place 1 spray into both nostrils as needed for congestion (nose irritation, give  1st for congestion).   terbinafine (LAMISIL) 250 MG tablet Take 250 mg by mouth daily.   traZODone (DESYREL) 50 MG tablet      Allergies:    Patient has no known allergies.   Social History: Social History   Socioeconomic History   Marital status: Married    Spouse name: Not on file   Number of children: 1   Years of education: Not on file   Highest education level: Not on file  Occupational History   Occupation: Chief of Staff: [employer]  Tobacco Use   Smoking status: Never   Smokeless tobacco: Never  Substance and Sexual Activity   Alcohol use: Not Currently    Alcohol/week: 10.0 standard drinks    Types: 10 Cans of beer per week   Drug use: No   Sexual activity: Not Currently  Other Topics Concern   Not on file  Social History Narrative   Not on file   Social Determinants of Health   Financial Resource Strain: Not on file  Food Insecurity: Not on file  Transportation Needs: Not on file  Physical Activity: Not on file  Stress: Not on file  Social Connections: Not on file     Family History: The patient's family history includes Allergies in his mother; Asthma in his mother; Heart attack in his father; Heart disease in his father.  ROS:   All other ROS reviewed and negative. Pertinent positives noted in the HPI.     EKGs/Labs/Other Studies Reviewed:   The following studies were personally reviewed by me today:  TTE 08/16/2020   1. Left ventricular ejection fraction, by estimation, is 60 to 65%. The  left ventricle has normal function. The left ventricle has no regional  wall motion abnormalities. Left ventricular diastolic parameters are  consistent with Grade I diastolic  dysfunction (impaired relaxation).   2. Right ventricular systolic function is normal. The right ventricular  size is mildly enlarged. There is normal pulmonary artery systolic  pressure. The estimated right ventricular systolic pressure is 65.7 mmHg.   3. The mitral valve  is normal in structure. Trivial mitral valve  regurgitation.   4. The aortic valve is tricuspid. There is mild calcification of the  aortic valve. There is mild thickening of the aortic valve. Aortic valve  regurgitation is not visualized.   Recent Labs: 05/31/2020: TSH 4.077 06/13/2020: ALT 33 06/18/2020: BUN 9; Creatinine, Ser 0.52; Hemoglobin 10.6; Magnesium 2.2; Platelets 274; Sodium 140 06/19/2020: Potassium 4.1 08/15/2020: BNP 17.0   Recent Lipid Panel    Component Value Date/Time   CHOL 169 04/25/2020  0950   TRIG 123 04/25/2020 0950   HDL 32 (L) 04/25/2020 0950   CHOLHDL 5.3 04/25/2020 0950   VLDL 25 04/25/2020 0950   LDLCALC 112 (H) 04/25/2020 0950   LDLDIRECT 162.1 12/21/2007 0000    Physical Exam:   VS:  BP (!) 152/80   Pulse 78   Ht 5' 8.5" (1.74 m)   Wt 235 lb 9.6 oz (106.9 kg)   SpO2 95%   BMI 35.30 kg/m    Wt Readings from Last 3 Encounters:  01/12/21 235 lb 9.6 oz (106.9 kg)  12/11/20 232 lb 9.6 oz (105.5 kg)  10/19/20 226 lb (102.5 kg)    General: Well nourished, well developed, in no acute distress Head: Atraumatic, normal size  Eyes: PEERLA, EOMI  Neck: Supple, no JVD Endocrine: No thryomegaly Cardiac: Normal S1, S2; RRR; no murmurs, rubs, or gallops Lungs: Clear to auscultation bilaterally, no wheezing, rhonchi or rales  Abd: Soft, nontender, no hepatomegaly  Ext: No edema, pulses 2+ Musculoskeletal: No deformities, BUE and BLE strength normal and equal Skin: Warm and dry, no rashes   Neuro: Alert and oriented to person, place, time, and situation, CNII-XII grossly intact, no focal deficits  Psych: Normal mood and affect   ASSESSMENT:   Clayton [name] is a 65 y.o. male who presents for the following: 1. SOB (shortness of breath) on exertion   2. Mixed hyperlipidemia     PLAN:   1. SOB (shortness of breath) on exertion -Evaluated for shortness of breath after COVID 19 pneumonia.  Examination was inconsistent with congestive heart failure.   BNP was negative.  I suspect he was just a little volume up after leaving the hospital with receiving intravenous fluids.  He has not required any Lasix moving forward.  His echocardiogram shows no evidence of systolic dysfunction.  He likely has normal diastolic function.  He does not have congestive heart failure.  All of his symptoms are related to restrictive lung disease after COVID-19 infection.  He will continue to work with pulmonary on this.  2. Mixed hyperlipidemia -He has been restarted on Lipitor 40 mg daily.  He will continue this.  Disposition: Return if symptoms worsen or fail to improve.  Medication Adjustments/Labs and Tests Ordered: Current medicines are reviewed at length with the patient today.  Concerns regarding medicines are outlined above.  No orders of the defined types were placed in this encounter.  No orders of the defined types were placed in this encounter.   Patient Instructions  Medication Instructions:  The current medical regimen is effective;  continue present plan and medications.  *If you need a refill on your cardiac medications before your next appointment, please call your pharmacy*   Follow-Up: At [facility], you and your health needs are our priority.  As part of our continuing mission to provide you with exceptional heart care, we have created designated Provider Care Teams.  These Care Teams include your primary Cardiologist (physician) and Advanced Practice Providers (APPs -  Physician Assistants and Nurse Practitioners) who all work together to provide you with the care you need, when you need it.  We recommend signing up for the patient portal called "MyChart".  Sign up information is provided on this After Visit Summary.  MyChart is used to connect with patients for Virtual Visits (Telemedicine).  Patients are able to view lab/test results, encounter notes, upcoming appointments, etc.  Non-urgent messages can be sent to your provider as  well.   To learn more  about what you can do with MyChart, go to [email or web address].    Your next appointment:   As needed  The format for your next appointment:   In Person  Provider:   Eleonore Chiquito, MD      Time Spent with Patient: I have spent a total of 25 minutes with patient reviewing hospital notes, telemetry, EKGs, labs and examining the patient as well as establishing an assessment and plan that was discussed with the patient.  > 50% of time was spent in direct patient care.  Signed, Addison Naegeli. Audie Box, MD, [place]  [address], [place], [place] [address] [phone]  01/12/2021 9:40 AM

## 2021-01-11 ENCOUNTER — Telehealth: Payer: Self-pay | Admitting: Pulmonary Disease

## 2021-01-11 NOTE — Telephone Encounter (Signed)
ATC Patient.  LM to call back when available.  

## 2021-01-11 NOTE — Telephone Encounter (Signed)
Called and spoke with patient, he states that his oxygen levels drop when he goes to [facility] and is walking and it causes him to be very tired.  He states his oxygen levels drop when he carries things and goes up and down the stairs at his home.  When he cuts the grass he has to stop and catch his breath after he does two runs.  I advised him that we do have other tests such as a 6 minute walk he can do if his sats do not drop with the walk in the office.  I also advised him that if insurance/DME company is requesting information regarding the need for oxygen then we have to provide the information to them.  I also advised him that if he needs oxygen, we will do what we need to to make sure he has what he needs.  I also advised him I would let Dr. Erin Fulling know and would call him back after we heard back from him.  He also has an appointment to see Rexene Edison NP on Monday 01/15/21.  He verbalized understanding.  Dr. Erin Fulling,  Please advise if you have any other suggestions.  Thank you.

## 2021-01-12 ENCOUNTER — Ambulatory Visit (INDEPENDENT_AMBULATORY_CARE_PROVIDER_SITE_OTHER): Payer: 59 | Admitting: Cardiovascular Disease

## 2021-01-12 ENCOUNTER — Other Ambulatory Visit: Payer: Self-pay

## 2021-01-12 ENCOUNTER — Encounter: Payer: Self-pay | Admitting: Cardiovascular Disease

## 2021-01-12 ENCOUNTER — Other Ambulatory Visit: Payer: Self-pay | Admitting: *Deleted

## 2021-01-12 VITALS — BP 152/80 | HR 78 | Ht 68.5 in | Wt 235.6 lb

## 2021-01-12 DIAGNOSIS — R0602 Shortness of breath: Secondary | ICD-10-CM | POA: Diagnosis not present

## 2021-01-12 DIAGNOSIS — E782 Mixed hyperlipidemia: Secondary | ICD-10-CM | POA: Diagnosis not present

## 2021-01-12 NOTE — Patient Instructions (Signed)
Medication Instructions:  The current medical regimen is effective;  continue present plan and medications.  *If you need a refill on your cardiac medications before your next appointment, please call your pharmacy*    Follow-Up: At [facility], you and your health needs are our priority.  As part of our continuing mission to provide you with exceptional heart care, we have created designated Provider Care Teams.  These Care Teams include your primary Cardiologist (physician) and Advanced Practice Providers (APPs -  Physician Assistants and Nurse Practitioners) who all work together to provide you with the care you need, when you need it.  We recommend signing up for the patient portal called "MyChart".  Sign up information is provided on this After Visit Summary.  MyChart is used to connect with patients for Virtual Visits (Telemedicine).  Patients are able to view lab/test results, encounter notes, upcoming appointments, etc.  Non-urgent messages can be sent to your provider as well.   To learn more about what you can do with MyChart, go to [email or web address].    Your next appointment:   As needed  The format for your next appointment:   In Person  Provider:   [place] O'Neal, MD      

## 2021-01-12 NOTE — Telephone Encounter (Signed)
Called and spoke with patient, advised of recommendations per Dr. Erin Fulling.  He verbalized understanding.  Patient scheduled for 6 minute walk on 01/17/21 at 2 pm, advised to arrive by 1:45 pm for check in.  He is also agreeable to be referred to pulmonary rehab.  Referral placed for pulmonary rehab.  He has an appointment with TP on Monday 6/13 at 9:30 am, advised to arrive by 9:15 am for check in.  Nothing further needed.

## 2021-01-15 ENCOUNTER — Telehealth: Payer: Self-pay

## 2021-01-15 ENCOUNTER — Other Ambulatory Visit: Payer: Self-pay

## 2021-01-15 ENCOUNTER — Encounter: Payer: Self-pay | Admitting: Adult Health

## 2021-01-15 ENCOUNTER — Ambulatory Visit (INDEPENDENT_AMBULATORY_CARE_PROVIDER_SITE_OTHER): Payer: 59 | Admitting: Adult Health

## 2021-01-15 VITALS — BP 128/80 | HR 77 | Ht 68.5 in | Wt 236.0 lb

## 2021-01-15 DIAGNOSIS — U099 Post covid-19 condition, unspecified: Secondary | ICD-10-CM | POA: Diagnosis not present

## 2021-01-15 DIAGNOSIS — J9611 Chronic respiratory failure with hypoxia: Secondary | ICD-10-CM

## 2021-01-15 DIAGNOSIS — G4733 Obstructive sleep apnea (adult) (pediatric): Secondary | ICD-10-CM | POA: Insufficient documentation

## 2021-01-15 DIAGNOSIS — R5381 Other malaise: Secondary | ICD-10-CM | POA: Insufficient documentation

## 2021-01-15 DIAGNOSIS — R0602 Shortness of breath: Secondary | ICD-10-CM | POA: Diagnosis not present

## 2021-01-15 NOTE — Assessment & Plan Note (Signed)
Recommend pulmonary rehab

## 2021-01-15 NOTE — Assessment & Plan Note (Signed)
Continue on CPAP at bedtime.  CPAP download was requested. CPAP patient education was given

## 2021-01-15 NOTE — Patient Instructions (Signed)
Activity as tolerated.  Pulmonary rehab as planned.  Continue on CPAP At bedtime  .  CPAP download .  Set up HRCT Chest .  Follow up with Dr. Erin Fulling in 3 months and As needed

## 2021-01-15 NOTE — Assessment & Plan Note (Signed)
Patient has no desaturations with ambulation.  May DC oxygen during the daytime. Continue O2 at 2 L with his CPAP.

## 2021-01-15 NOTE — Telephone Encounter (Signed)
Spoke with [employer] who stated they are currently working on pt's C-Pap compliance and would fax it to our office when completed. [employer] also stated they are currently working on getting pt added to [place]. Nothing further needed at this time.

## 2021-01-15 NOTE — Assessment & Plan Note (Signed)
Suspect is multifactorial with associated deconditioning, restrictive lung disease with previous COVID-19 pneumonia.  We will check a high-resolution CT chest to evaluate for possible postinflammatory fibrosis Recommend pulmonary rehab   plan   Patient Instructions  Activity as tolerated.  Pulmonary rehab as planned.  Continue on CPAP At bedtime  .  CPAP download .  Set up HRCT Chest .  Follow up with Dr. Erin Fulling in 3 months and As needed

## 2021-01-15 NOTE — Progress Notes (Signed)
@Patient  ID: Clayton [name], male    DOB: September 07, 1955, 65 y.o.   MRN: [id number]  Chief Complaint  Patient presents with   Follow-up    Referring provider: Kristen Loader, FNP  HPI: 65 year old male never smoker seen for pulmonary consult December 2021 after prolonged hospitalization with COVID-19 (September 2021), COVID-pneumonia and oxygen dependent respiratory failure Medical history significant for chronic cough, diabetes, sleep apnea  TEST/EVENTS :  Admitted April 24, 2020 to May 30, 2020 for respiratory failure in the setting of COVID-19 pneumonia, DVTs.   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 No visible effusion or air leak. Stable heart size which is accentuated by technique.   PFT: PFT Results Latest Ref Rng & Units 10/24/2020  FVC-Pre L 2.67  FVC-Predicted Pre % 61  FVC-Post L 2.66  FVC-Predicted Post % 61  Pre FEV1/FVC % % 85  Post FEV1/FCV % % 89  FEV1-Pre L 2.28  FEV1-Predicted Pre % 70  FEV1-Post L 2.36  DLCO uncorrected ml/min/mmHg 20.61  DLCO UNC% % 80  DLCO corrected ml/min/mmHg 20.61  DLCO COR %Predicted % 80  DLVA Predicted % 121  TLC L 4.20  TLC % Predicted % 63  RV % Predicted % 72  PFTs 2022: Mild restrictive defect   Echo: 05/02/20 1. Limited study with poor echo windows, Definity contrast given   2. Left ventricular ejection fraction, by estimation, is 65 to 70%. The  left ventricle has normal function. The left ventricle has no regional  wall motion abnormalities. There is mild left ventricular hypertrophy.  Left ventricular diastolic parameters  are consistent with Grade I diastolic dysfunction (impaired relaxation).   3. The aortic valve is tricuspid. Aortic valve regurgitation is not  visualized. Mild aortic valve sclerosis is present, with no evidence of  aortic valve stenosis.   Lower extremity Doppler US 04/28/20 RIGHT:  - Findings consistent with  acute deep vein thrombosis involving the right  posterior tibial veins, and right peroneal veins.  - No cystic structure found in the popliteal fossa.     LEFT:  - Findings consistent with acute deep vein thrombosis involving the left  posterior tibial veins, and left soleal veins.  - No cystic structure found in the popliteal fossa.   Sleep Study 08/27/20   Overall AHI 7.2/hr REM AHI 28.8/hr Supine AHI 50.6/hr Min SpO2 77% 01/15/2021 Follow up : Dyspnea, OSA , Restrictive lung disease , O2 RF , OSA  Patient returns for a follow-up visit.  Patient had critical illness in September 2021 with prolonged hospitalization with acute respiratory failure in the setting of [id number] infection complicated by COVID-pneumonia and DVTs.  Patient has had slow recovery over the last 9 months with long-haul symptoms of shortness of breath decreased activity tolerance and fatigue, anxiety.  Pulmonary function testing has showed moderate restriction.  Patient was seen last month and recommended to begin pulmonary rehab for associated deconditioning.    Patient did have COVID pneumonia, follow-up chest x-rays have showed some interstitial changes.  Patient was discharged on oxygen.  Has had decreased oxygen demands.  At last visit did not have any desaturations.  Patient is concerned that he still needs oxygen.  Because he gets short of breath with activities and wears out of energy easily. Walk test in the office today showed no desaturations with O2 saturations maintaining 93 to 95% with ambulation on room air.  We discussed that oxygen is no longer necessary at this  time.  Patient has underlying obstructive sleep apnea.  Is on CPAP at nighttime with oxygen 2l/m  patient says he is wearing each night.  Gets in about 4 to 6 hours each night.  Says it does help some.  But he has general fatigue. CPAP download was requested.  Patient denies any significant cough or wheezing.  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   Denies any known bleeding.  No Known Allergies  Immunization History  Administered Date(s) Administered   Influenza,inj,Quad PF,6+ Mos 07/16/2017   Tdap 10/27/2014, 08/04/2015    Past Medical History:  Diagnosis Date   Arthritis    Asthma    pulmonary allergies- no asthma per pt   Atrophic condition of skin    Cellulitis/abscess - trunk    Cyst    shoulder   Diabetes mellitus without complication (HCC)    Hyperlipidemia    Hypertension    Hypertrophic condition of skin    Lipoma    Obesity    Pneumonia    hx child   Sleep apnea    no cpap   Stones in the urinary tract    Vitamin D deficiency     Tobacco History: Social History   Tobacco Use  Smoking Status Never  Smokeless Tobacco Never   Counseling given: Not Answered   Outpatient Medications Prior to Visit  Medication Sig Dispense Refill   acetaminophen (TYLENOL) 325 MG tablet Take 2 tablets (650 mg total) by mouth every 6 (six) hours as needed for fever.     albuterol (VENTOLIN HFA) 108 (90 Base) MCG/ACT inhaler Inhale 2 puffs into the lungs every 4 (four) hours as needed for wheezing or shortness of breath. 1 each 0   Apixaban (ELIQUIS PO) Take 1 tablet by mouth in the morning and at bedtime.     atorvastatin (LIPITOR) 40 MG tablet Take 1 tablet by mouth daily.     clonazePAM (KLONOPIN) 0.5 MG tablet Take 0.5 mg by mouth 3 (three) times daily as needed.     metFORMIN (GLUCOPHAGE-XR) 750 MG 24 hr tablet metformin ER 750 mg tablet,extended release 24 hr     montelukast (SINGULAIR) 10 MG tablet Take 10 mg by mouth daily.     sertraline (ZOLOFT) 100 MG tablet Take 100 mg by mouth daily.     sitaGLIPtin (JANUVIA) 100 MG tablet Take 1 tablet by mouth daily.     sodium chloride (OCEAN) 0.65 % SOLN nasal spray Place 1 spray into both nostrils as needed for congestion (nose irritation, give 1st for congestion). 30 mL 0   terbinafine (LAMISIL) 250 MG tablet Take 250 mg by mouth daily.     ipratropium-albuterol  (DUONEB) 0.5-2.5 (3) MG/3ML SOLN      sildenafil (REVATIO) 20 MG tablet SMARTSIG:1-5 Tablet(s) By Mouth Daily PRN     furosemide (LASIX) 20 MG tablet Take 1 tablet (20 mg total) by mouth daily. 30 tablet 0   metoprolol tartrate (LOPRESSOR) 25 MG tablet metoprolol tartrate 25 mg tablet     potassium chloride (KLOR-CON) 10 MEQ tablet Take 1 tablet (10 mEq total) by mouth daily. 30 tablet 0   SITagliptin Phosphate (JANUVIA PO) Take 1 tablet by mouth at bedtime.     traZODone (DESYREL) 50 MG tablet      No facility-administered medications prior to visit.     Review of Systems:   Constitutional:   No  weight loss, night sweats,  Fevers, chills,  +fatigue, or  lassitude.  HEENT:   No  headaches,  Difficulty swallowing,  Tooth/dental problems, or  Sore throat,                No sneezing, itching, ear ache, nasal congestion, post nasal drip,   CV:  No chest pain,  Orthopnea, PND, swelling in lower extremities, anasarca, dizziness, palpitations, syncope.   GI  No heartburn, indigestion, abdominal pain, nausea, vomiting, diarrhea, change in bowel habits, loss of appetite, bloody stools.   Resp:  No excess mucus, no productive cough,  No non-productive cough,  No coughing up of blood.  No change in color of mucus.  No wheezing.  No chest wall deformity  Skin: no rash or lesions.  GU: no dysuria, change in color of urine, no urgency or frequency.  No flank pain, no hematuria   MS:  No joint pain or swelling.  No decreased range of motion.  + back pain.    Physical Exam  BP 128/80   Pulse 77   Ht 5' 8.5" (1.74 m)   Wt 236 lb (107 kg)   SpO2 95%   BMI 35.36 kg/m   GEN: A/Ox3; pleasant , NAD, well nourished    HEENT:  [place]/AT,  EACs-clear, TMs-wnl, NOSE-clear, THROAT-clear, no lesions, no postnasal drip or exudate noted.   NECK:  Supple w/ fair ROM; no JVD; normal carotid impulses w/o bruits; no thyromegaly or nodules palpated; no lymphadenopathy.    RESP  Clear  P & A; w/o, wheezes/  rales/ or rhonchi. no accessory muscle use, no dullness to percussion  CARD:  RRR, no m/r/g, no peripheral edema, pulses intact, no cyanosis or clubbing.  GI:   Soft & nt; nml bowel sounds; no organomegaly or masses detected.   Musco: Warm bil, no deformities or joint swelling noted.   Neuro: alert, no focal deficits noted.    Skin: Warm, no lesions or rashes    Lab Results:  CBC    ProBNP No results found for: PROBNP  Imaging: No results found.    PFT Results Latest Ref Rng & Units 10/24/2020  FVC-Pre L 2.67  FVC-Predicted Pre % 61  FVC-Post L 2.66  FVC-Predicted Post % 61  Pre FEV1/FVC % % 85  Post FEV1/FCV % % 89  FEV1-Pre L 2.28  FEV1-Predicted Pre % 70  FEV1-Post L 2.36  DLCO uncorrected ml/min/mmHg 20.61  DLCO UNC% % 80  DLCO corrected ml/min/mmHg 20.61  DLCO COR %Predicted % 80  DLVA Predicted % 121  TLC L 4.20  TLC % Predicted % 63  RV % Predicted % 72    No results found for: NITRICOXIDE      Assessment & Plan:   OSA (obstructive sleep apnea) Continue on CPAP at bedtime.  CPAP download was requested. CPAP patient education was given  Chronic respiratory failure with hypoxia ([place]) Patient has no desaturations with ambulation.  May DC oxygen during the daytime. Continue O2 at 2 L with his CPAP.  Shortness of breath Suspect is multifactorial with associated deconditioning, restrictive lung disease with previous COVID-19 pneumonia.  We will check a high-resolution CT chest to evaluate for possible postinflammatory fibrosis Recommend pulmonary rehab   plan   Patient Instructions  Activity as tolerated.  Pulmonary rehab as planned.  Continue on CPAP At bedtime  .  CPAP download .  Set up HRCT Chest .  Follow up with Dr. Erin Fulling in 3 months and As needed       Physical deconditioning Recommend pulmonary rehab    I spent  41  minutes dedicated to the care of this patient on the date of this encounter to include pre-visit review of  records, face-to-face time with the patient discussing conditions above, post visit ordering of testing, clinical documentation with the electronic health record, making appropriate referrals as documented, and communicating necessary findings to members of the patients care team.   Rexene Edison, NP 01/15/2021

## 2021-01-15 NOTE — Telephone Encounter (Signed)
Spoke with CPAP compliance department with [employer] and they will contact patient to get him uploaded to [employer].

## 2021-01-17 ENCOUNTER — Ambulatory Visit: Payer: 59

## 2021-01-17 ENCOUNTER — Other Ambulatory Visit: Payer: Self-pay

## 2021-01-17 DIAGNOSIS — J9611 Chronic respiratory failure with hypoxia: Secondary | ICD-10-CM

## 2021-01-17 NOTE — Progress Notes (Signed)
Pt came into the office to see if he could requalify for his O2. Pt walked at a moderate pace and at the final lap, sats did drop to 88% on room air. Pt was stopped and placed on 2L O2. Pt was walked once more on the 2L and sats remained stable.  Pt told me when at home, he does usually use 2L O2 via his home concentrator due to him having the longer tubing but when he is out running errand, he will mainly use 1L with his small canisters. I stated to pt that if he started feeling like the 1L was not enough that he could bump himself up to the 2L as that is what he did qualify for.  Routing all this to Dr. Erin Fulling and also placing order to be sent to [employer] so they are aware that pt has requalified for his O2.

## 2021-01-24 ENCOUNTER — Other Ambulatory Visit: Payer: Self-pay

## 2021-01-24 ENCOUNTER — Ambulatory Visit (INDEPENDENT_AMBULATORY_CARE_PROVIDER_SITE_OTHER)
Admission: RE | Admit: 2021-01-24 | Discharge: 2021-01-24 | Disposition: A | Discharge status: Home / Self Care | Payer: 59 | Source: Ambulatory Visit | Attending: Adult Health | Admitting: Adult Health

## 2021-01-24 DIAGNOSIS — R0602 Shortness of breath: Secondary | ICD-10-CM

## 2021-01-24 DIAGNOSIS — U099 Post covid-19 condition, unspecified: Secondary | ICD-10-CM

## 2021-01-25 ENCOUNTER — Telehealth (HOSPITAL_COMMUNITY): Payer: Self-pay

## 2021-01-25 NOTE — Telephone Encounter (Signed)
Pt insurance is active and benefits verified through [facility]. Co-pay $0.00, DED $100.00/$100.00 met, out of pocket $800.00/$800.00 met, co-insurance 10%. No pre-authorization required. Sabrina/[facility], 01/25/21 @ 2:13PM, ITG#[id number]   Will contact patient to see if he is interested in the Pulmonary Rehab Program.

## 2021-01-25 NOTE — Telephone Encounter (Signed)
Called patient to see if he is interested in the Pulmonary Rehab Program. Patient expressed interest. Explained scheduling process and went over insurance, patient verbalized understanding. Also adv pt where we are with scheduling for PR and that we have a back log. (1-3 months) 

## 2021-01-31 NOTE — Progress Notes (Signed)
ATC x1, left VM to return call regarding CT scan results.

## 2021-02-01 NOTE — Progress Notes (Signed)
Called and spoke with patient and he voices understanding. He is planning on following up with Cardiology and his PCP.

## 2021-02-22 ENCOUNTER — Telehealth (HOSPITAL_COMMUNITY): Payer: Self-pay

## 2021-02-22 NOTE — Telephone Encounter (Signed)
Called patient to see if he was interested in participating in the Pulmonary Rehab Program. Patient stated yes. Patient will come in for orientation on 03/30/21 @ 10:30AM and will attend the 10:15AM exercise class.   Tourist information centre manager.

## 2021-03-27 ENCOUNTER — Telehealth: Payer: Self-pay | Admitting: Cardiovascular Disease

## 2021-03-27 NOTE — Telephone Encounter (Signed)
Unable to locate who may have called pt.  Called him and asked if anyone left a message.  He states they called the house phone and then his cell but did not leave a message on either number.  Advised I will send message to Dr. Kathalene Frames nurse in the event that it was her.  Advised if she has not called, then he would not get a call back.  Pt appreciative for assistance.

## 2021-03-27 NOTE — Telephone Encounter (Signed)
I did not contact patient- no recent results, or calls needed.  I will be glad to speak to patient if he calls back.  Thanks!

## 2021-03-27 NOTE — Telephone Encounter (Signed)
Patient states he is returning a call from today. I did not see any notes. 

## 2021-03-28 ENCOUNTER — Telehealth (HOSPITAL_COMMUNITY): Payer: Self-pay

## 2021-03-28 NOTE — Telephone Encounter (Signed)
Called pt to make sure he was coming to orientation on Friday 8/26. His wife answered the phone and said that he will be coming. Told Tanya directions to find our department. Lavella Lemons wrote down directions to our department and our phone number. Mentioned to Lavella Lemons that he should wear closed toed shoes for the 6 MWT and that he will be COVID screened.

## 2021-03-30 ENCOUNTER — Encounter (HOSPITAL_COMMUNITY)
Admission: RE | Admit: 2021-03-30 | Discharge: 2021-03-30 | Disposition: A | Discharge status: Home / Self Care | Payer: 59 | Source: Ambulatory Visit | Attending: Pulmonary Disease | Admitting: Pulmonary Disease

## 2021-03-30 ENCOUNTER — Other Ambulatory Visit: Payer: Self-pay

## 2021-03-30 VITALS — Ht 67.0 in | Wt 233.7 lb

## 2021-03-30 DIAGNOSIS — J1282 Pneumonia due to coronavirus disease 2019: Secondary | ICD-10-CM

## 2021-03-30 DIAGNOSIS — U099 Post covid-19 condition, unspecified: Secondary | ICD-10-CM

## 2021-03-30 DIAGNOSIS — J984 Other disorders of lung: Secondary | ICD-10-CM | POA: Diagnosis not present

## 2021-03-30 NOTE — Progress Notes (Signed)
Clayton [name] 65 y.o. male Pulmonary Rehab Orientation Note This patient who was referred to Pulmonary rehab by Dr. 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 He arrived ambulatory with steady gait with slight limp associated with lumbar spinosis.  He received injection to his back on yesterday. He does not carry portable oxygen. [facility] is the provider for their MDE. Per pt, he uses oxygen rarely.  He typically will wear oxygen on humid days when it is difficult to breath.  Clayton [name] has a pulse oximeter and is aware of when to place pulse oximeter on. Color good, skin warm and dry. Patient is oriented to time and place. Patient's medical history, psychosocial health, and medications reviewed. Psychosocial assessment reveals pt lives with their family which includes his 35 mother, wife and 55 year old son who is Autistic and has ADHD. Pt is currently unemployed, disabled. Pt hobbies include music dj at events and woodworking. Pt reports his stress level is moderate. Areas of stress/anxiety include Health Family Finances.  Pt does exhibit signs of depression. Signs of depression include anxiety and difficulty falling asleep, difficulty maintaining sleep, early morning awakenings, and fatigue.  Pt endorses that this has been a difficult journey as he his wife and mother were hospitalized with COVID at the same time.  As a result of his 11 weeks in the hospital, he has continues to have issues with his breathing, blood clots and enlarged heart. PHQ2/9 score 4/12. Pt shows good  coping skills with  mostly positive outlook . Clayton [name] recognizes that he was at deaths door and he survived.  His faith is what has gotten him through all of this.  Clayton [name]  offered emotional support and reassurance. Will explore counseling support in the community. Will continue to monitor and evaluate progress toward psychosocial  goal(s) of being able to walk with no shortness of breath and regain strength. Physical assessment reveals heart rate is normal, breath sounds clear to auscultation, no wheezes, rales, or rhonchi. Slight coarse. Grip strength equal, strong. Distal pulses palpable. Patient reports hedoes take medications as prescribed. Patient states he follows a Regular diet. The patient reports no specific efforts to gain or lose weight..  However he would like to lose some weight. Patient's weight will be monitored closely Demonstration and practice of PLB using pulse oximeter. Patient able to return demonstration satisfactorily. Safety and hand hygiene in the exercise area reviewed with patient. Patient voices understanding of the information reviewed. Department expectations discussed with patient and achievable goals were set. The patient shows enthusiasm about attending the program and we look forward to working with this nice gentleman. To begin exercise on 8/30.  45 minutes was spent on a variety of activities such as assessment of the patient, obtaining baseline data including height, weight, BMI, and grip strength, verifying medical history, allergies, and current medications, and teaching patient strategies for performing tasks with less respiratory effort with emphasis on pursed lip breathing.  Cherre Huger, BSN Cardiac and Training and development officer

## 2021-04-02 NOTE — Progress Notes (Signed)
Pulmonary Individual Treatment Plan  Patient Details  Name: Clayton [name] MRN: [id number] Date of Birth: [birth year] Referring Provider:   April Manson Pulmonary Rehab Walk Test from 03/30/2021 in [place]  Referring Provider Dr. Erin Fulling       Initial Encounter Date:  Flowsheet Row Pulmonary Rehab Walk Test from 03/30/2021 in [place]  Date 03/30/21       Visit Diagnosis: Pneumonia due to coronavirus disease 2019 (CODE)  Post covid-19 condition, unspecified  Patient's Home Medications on Admission:   Current Outpatient Medications:    acetaminophen (TYLENOL) 325 MG tablet, Take 2 tablets (650 mg total) by mouth every 6 (six) hours as needed for fever., Disp: , Rfl:    albuterol (VENTOLIN HFA) 108 (90 Base) MCG/ACT inhaler, Inhale 2 puffs into the lungs every 4 (four) hours as needed for wheezing or shortness of breath., Disp: 1 each, Rfl: 0   Apixaban (ELIQUIS PO), Take 1 tablet by mouth in the morning and at bedtime., Disp: , Rfl:    atorvastatin (LIPITOR) 40 MG tablet, Take 1 tablet by mouth daily., Disp: , Rfl:    clonazePAM (KLONOPIN) 0.5 MG tablet, Take 0.5 mg by mouth 3 (three) times daily as needed., Disp: , Rfl:    ipratropium-albuterol (DUONEB) 0.5-2.5 (3) MG/3ML SOLN, , Disp: , Rfl:    metFORMIN (GLUCOPHAGE-XR) 750 MG 24 hr tablet, metformin ER 750 mg tablet,extended release 24 hr, Disp: , Rfl:    montelukast (SINGULAIR) 10 MG tablet, Take 10 mg by mouth daily., Disp: , Rfl:    sertraline (ZOLOFT) 100 MG tablet, Take 100 mg by mouth daily., Disp: , Rfl:    sitaGLIPtin (JANUVIA) 100 MG tablet, Take 1 tablet by mouth daily., Disp: , Rfl:    sodium chloride (OCEAN) 0.65 % SOLN nasal spray, Place 1 spray into both nostrils as needed for congestion (nose irritation, give 1st for congestion)., Disp: 30 mL, Rfl: 0   sildenafil (REVATIO) 20 MG tablet, SMARTSIG:1-5 Tablet(s) By Mouth Daily PRN (Patient not taking:  Reported on 03/30/2021), Disp: , Rfl:    terbinafine (LAMISIL) 250 MG tablet, Take 250 mg by mouth daily. (Patient not taking: Reported on 03/30/2021), Disp: , Rfl:   Past Medical History: Past Medical History:  Diagnosis Date   Arthritis    Asthma    pulmonary allergies- no asthma per pt   Atrophic condition of skin    Cellulitis/abscess - trunk    Cyst    shoulder   Diabetes mellitus without complication (HCC)    Hyperlipidemia    Hypertension    Hypertrophic condition of skin    Lipoma    Obesity    Pneumonia    hx child   Sleep apnea    no cpap   Stones in the urinary tract    Vitamin D deficiency     Tobacco Use: Social History   Tobacco Use  Smoking Status Never  Smokeless Tobacco Never    Labs: Recent Review Flowsheet Data     Labs for ITP Cardiac and Pulmonary Rehab Latest Ref Rng & Units 04/24/2020 04/25/2020 05/05/2020 05/16/2020 05/31/2020   Cholestrol 0 - 200 mg/dL - 169 - - -   LDLCALC 0 - 99 mg/dL - 112(H) - - -   LDLDIRECT mg/dL - - - - -   HDL >40 mg/dL - 32(L) - - -   Trlycerides <150 mg/dL 163(H) 123 - - -   Hemoglobin A1c 4.8 - 5.6 % -  8.4(H) - - 8.6(H)   PHART 7.350 - 7.450 - - 7.440 7.455(H) -   PCO2ART 32.0 - 48.0 mmHg - - 45.7 50.5(H) -   HCO3 20.0 - 28.0 mmol/L - - 30.6(H) 35.1(H) -   O2SAT % - - 96.4 86.6 -       Capillary Blood Glucose: Lab Results  Component Value Date   GLUCAP 219 (H) 05/30/2020   GLUCAP 123 (H) 05/30/2020   GLUCAP 146 (H) 05/29/2020   GLUCAP 318 (H) 05/29/2020   GLUCAP 208 (H) 05/29/2020     Pulmonary Assessment Scores:  Pulmonary Assessment Scores     Row Name 03/30/21 1205         ADL UCSD   ADL Phase Entry     SOB Score total 54           CAT Score   CAT Score 22 - pre           mMRC Score   mMRC Score 4             UCSD: Self-administered rating of dyspnea associated with activities of daily living (ADLs) 6-point scale (0 = "not at all" to 5 = "maximal or unable to do because of  breathlessness")  Scoring Scores range from 0 to 120.  Minimally important difference is 5 units  CAT: CAT can identify the health impairment of COPD patients and is better correlated with disease progression.  CAT has a scoring range of zero to 40. The CAT score is classified into four groups of low (less than 10), medium (10 - 20), high (21-30) and very high (31-40) based on the impact level of disease on health status. A CAT score over 10 suggests significant symptoms.  A worsening CAT score could be explained by an exacerbation, poor medication adherence, poor inhaler technique, or progression of COPD or comorbid conditions.  CAT MCID is 2 points  mMRC: mMRC (Modified Medical Research Council) Dyspnea Scale is used to assess the degree of baseline functional disability in patients of respiratory disease due to dyspnea. No minimal important difference is established. A decrease in score of 1 point or greater is considered a positive change.   Pulmonary Function Assessment:   Exercise Target Goals: Exercise Program Goal: Individual exercise prescription set using results from initial 6 min walk test and THRR while considering  patient's activity barriers and safety.   Exercise Prescription Goal: Initial exercise prescription builds to 30-45 minutes a day of aerobic activity, 2-3 days per week.  Home exercise guidelines will be given to patient during program as part of exercise prescription that the participant will acknowledge.  Activity Barriers & Risk Stratification:  Activity Barriers & Cardiac Risk Stratification - 03/30/21 1151       Activity Barriers & Cardiac Risk Stratification   Activity Barriers Back Problems;Deconditioning;Shortness of Breath;History of Falls;Balance Concerns    Cardiac Risk Stratification Moderate             6 Minute Walk:  6 Minute Walk     Row Name 03/30/21 1328         6 Minute Walk   Phase Initial     Distance 1460 feet     Walk Time  6 minutes     # of Rest Breaks 0     MPH 2.77     METS 3.3     RPE 14     Perceived Dyspnea  1     VO2 Peak 11.54  Symptoms Yes (comment)     Comments Back pain 7/10     Resting HR 77 bpm     Resting BP 140/68     Resting Oxygen Saturation  95 %     Exercise Oxygen Saturation  during 6 min walk 90 %     Max Ex. HR 112 bpm     Max Ex. BP 150/70     2 Minute Post BP 142/70           Interval HR   1 Minute HR 102     2 Minute HR 105     3 Minute HR 112     4 Minute HR 104     5 Minute HR 107     6 Minute HR 111     2 Minute Post HR 74     Interval Heart Rate? Yes           Interval Oxygen   Interval Oxygen? Yes     Baseline Oxygen Saturation % 95 %     1 Minute Oxygen Saturation % 91 %     1 Minute Liters of Oxygen 0 L     2 Minute Oxygen Saturation % 91 %     2 Minute Liters of Oxygen 0 L     3 Minute Oxygen Saturation % 90 %     3 Minute Liters of Oxygen 0 L     4 Minute Oxygen Saturation % 91 %     4 Minute Liters of Oxygen 0 L     5 Minute Oxygen Saturation % 90 %     5 Minute Liters of Oxygen 0 L     6 Minute Oxygen Saturation % 91 %     6 Minute Liters of Oxygen 0 L     2 Minute Post Oxygen Saturation % 95 %     2 Minute Post Liters of Oxygen 74 L              Oxygen Initial Assessment:  Oxygen Initial Assessment - 03/30/21 1201       Home Oxygen   Home Oxygen Device Portable Concentrator;E-Tanks;Home Concentrator             Oxygen Re-Evaluation:   Oxygen Discharge (Final Oxygen Re-Evaluation):   Initial Exercise Prescription:  Initial Exercise Prescription - 03/30/21 1300       Date of Initial Exercise RX and Referring Provider   Date 03/30/21    Referring Provider Dr. Erin Fulling    Expected Discharge Date 05/31/21      NuStep   Level 2    SPM 80    Minutes 15      Track   Minutes 15      Prescription Details   Frequency (times per week) 2    Duration Progress to 30 minutes of continuous aerobic without signs/symptoms of  physical distress      Intensity   THRR 40-80% of Max Heartrate 63-126    Ratings of Perceived Exertion 11-13    Perceived Dyspnea 0-4      Progression   Progression Continue to progress workloads to maintain intensity without signs/symptoms of physical distress.      Resistance Training   Training Prescription Yes    Weight Blue bands    Reps 10-15             Perform Capillary Blood Glucose checks as needed.  Exercise Prescription Changes:   Exercise Comments:  Exercise Goals and Review:   Exercise Goals     Row Name 03/30/21 1152             Exercise Goals   Increase Physical Activity Yes       Intervention Develop an individualized exercise prescription for aerobic and resistive training based on initial evaluation findings, risk stratification, comorbidities and participant's personal goals.;Provide advice, education, support and counseling about physical activity/exercise needs.       Expected Outcomes Short Term: Attend rehab on a regular basis to increase amount of physical activity.;Long Term: Add in home exercise to make exercise part of routine and to increase amount of physical activity.;Long Term: Exercising regularly at least 3-5 days a week.       Increase Strength and Stamina Yes       Intervention Provide advice, education, support and counseling about physical activity/exercise needs.;Develop an individualized exercise prescription for aerobic and resistive training based on initial evaluation findings, risk stratification, comorbidities and participant's personal goals.       Expected Outcomes Short Term: Increase workloads from initial exercise prescription for resistance, speed, and METs.;Short Term: Perform resistance training exercises routinely during rehab and add in resistance training at home;Long Term: Improve cardiorespiratory fitness, muscular endurance and strength as measured by increased METs and functional capacity (6MWT)       Able to  understand and use rate of perceived exertion (RPE) scale Yes       Intervention Provide education and explanation on how to use RPE scale       Expected Outcomes Short Term: Able to use RPE daily in rehab to express subjective intensity level;Long Term:  Able to use RPE to guide intensity level when exercising independently       Able to understand and use Dyspnea scale Yes       Intervention Provide education and explanation on how to use Dyspnea scale       Expected Outcomes Short Term: Able to use Dyspnea scale daily in rehab to express subjective sense of shortness of breath during exertion;Long Term: Able to use Dyspnea scale to guide intensity level when exercising independently       Knowledge and understanding of Target Heart Rate Range (THRR) Yes       Intervention Provide education and explanation of THRR including how the numbers were predicted and where they are located for reference       Expected Outcomes Short Term: Able to state/look up THRR;Short Term: Able to use daily as guideline for intensity in rehab;Long Term: Able to use THRR to govern intensity when exercising independently       Able to check pulse independently Yes       Intervention Provide education and demonstration on how to check pulse in carotid and radial arteries.;Review the importance of being able to check your own pulse for safety during independent exercise       Expected Outcomes Short Term: Able to explain why pulse checking is important during independent exercise;Long Term: Able to check pulse independently and accurately       Understanding of Exercise Prescription Yes       Intervention Provide education, explanation, and written materials on patient's individual exercise prescription       Expected Outcomes Short Term: Able to explain program exercise prescription;Long Term: Able to explain home exercise prescription to exercise independently                Exercise Goals Re-Evaluation  :  Discharge Exercise Prescription (Final Exercise Prescription Changes):   Nutrition:  Target Goals: Understanding of nutrition guidelines, daily intake of sodium '1500mg'$ , cholesterol '200mg'$ , calories 30% from fat and 7% or less from saturated fats, daily to have 5 or more servings of fruits and vegetables.  Biometrics:  Pre Biometrics - 03/30/21 1351       Pre Biometrics   Height '5\' 7"'$  (1.702 m)    Weight 106 kg    BMI (Calculated) 36.59    Grip Strength 20 kg              Nutrition Therapy Plan and Nutrition Goals:   Nutrition Assessments:  MEDIFICTS Score Key: ?70 Need to make dietary changes  40-70 Heart Healthy Diet ? 40 Therapeutic Level Cholesterol Diet   Picture Your Plate Scores: [id number] Unhealthy dietary pattern with much room for improvement. 41-50 Dietary pattern unlikely to meet recommendations for good health and room for improvement. 51-60 More healthful dietary pattern, with some room for improvement.  >60 Healthy dietary pattern, although there may be some specific behaviors that could be improved.    Nutrition Goals Re-Evaluation:   Nutrition Goals Discharge (Final Nutrition Goals Re-Evaluation):   Psychosocial: Target Goals: Acknowledge presence or absence of significant depression and/or stress, maximize coping skills, provide positive support system. Participant is able to verbalize types and ability to use techniques and skills needed for reducing stress and depression.  Initial Review & Psychosocial Screening:  Initial Psych Review & Screening - 03/30/21 1058       Initial Review   Current issues with Current Depression;Current Anxiety/Panic;Current Stress Concerns    Source of Stress Concerns Chronic Illness;Family;Unable to participate in former interests or hobbies;Unable to perform yard/household activities;Financial    Comments Tito at times feels overwhelmed by dependence of his family.  His wife does not work or help keep the  house clean. His 30 year old mother who has memory issues lives with him and requires physical help.  Tito 62 year old son who has Autism and ADHD acts out often and struggles with normal adolecent issues. Although tito does acknowledge that if he needs something they will help but in the areas that they could help themselves they do not.      Family Dynamics   Good Support System? No    Strains Illness and family care strain;Intra-family strains    Concerns Inappropriate over/under dependence on family/friends      Barriers   Psychosocial barriers to participate in program The patient should benefit from training in stress management and relaxation.      Screening Interventions   Interventions Encouraged to exercise;To provide support and resources with identified psychosocial needs    Expected Outcomes Long Term Goal: Stressors or current issues are controlled or eliminated.;Short Term goal: Identification and review with participant of any Quality of Life or Depression concerns found by scoring the questionnaire.;Long Term goal: The participant improves quality of Life and PHQ9 Scores as seen by post scores and/or verbalization of changes             Quality of Life Scores:  Scores of 19 and below usually indicate a poorer quality of life in these areas.  A difference of  2-3 points is a clinically meaningful difference.  A difference of 2-3 points in the total score of the Quality of Life Index has been associated with significant improvement in overall quality of life, self-image, physical symptoms, and general health in studies assessing change in quality of  life.  PHQ-9: Recent Review Flowsheet Data     Depression screen [facility] 2/9 03/30/2021 08/25/2015 08/16/2015 08/07/2015 08/04/2015   Decreased Interest 2 0 0 0 0   Down, Depressed, Hopeless 2 0 0 0 0   PHQ - 2 Score 4 0 0 0 0   Altered sleeping 3 - - - -   Tired, decreased energy 3 - - - -   Change in appetite 0 - - - -   Feeling  bad or failure about yourself  0 - - - -   Trouble concentrating 2 - - - -   Moving slowly or fidgety/restless 0 - - - -   Suicidal thoughts 0 - - - -   PHQ-9 Score 12 - - - -   Difficult doing work/chores Very difficult - - - -      Interpretation of Total Score  Total Score Depression Severity:  1-4 = Minimal depression, 5-9 = Mild depression, 10-14 = Moderate depression, 15-19 = Moderately severe depression, 20-27 = Severe depression   Psychosocial Evaluation and Intervention:   Psychosocial Re-Evaluation:   Psychosocial Discharge (Final Psychosocial Re-Evaluation):   Education: Education Goals: Education classes will be provided on a weekly basis, covering required topics. Participant will state understanding/return demonstration of topics presented.  Learning Barriers/Preferences:  Learning Barriers/Preferences - 03/30/21 1119       Learning Barriers/Preferences   Learning Barriers Sight;Hearing;Exercise Concerns    Learning Preferences Audio;Computer/Internet;Group Instruction;Individual Instruction;Pictoral;Skilled Demonstration;Verbal Instruction;Video;Written Material             Education Topics: Risk Factor Reduction:  -Group instruction that is supported by a PowerPoint presentation. Instructor discusses the definition of a risk factor, different risk factors for pulmonary disease, and how the heart and lungs work together.     Nutrition for Pulmonary Patient:  -Group instruction provided by PowerPoint slides, verbal discussion, and written materials to support subject matter. The instructor gives an explanation and review of healthy diet recommendations, which includes a discussion on weight management, recommendations for fruit and vegetable consumption, as well as protein, fluid, caffeine, fiber, sodium, sugar, and alcohol. Tips for eating when patients are short of breath are discussed.   Pursed Lip Breathing:  -Group instruction that is supported by  demonstration and informational handouts. Instructor discusses the benefits of pursed lip and diaphragmatic breathing and detailed demonstration on how to preform both.     Oxygen Safety:  -Group instruction provided by PowerPoint, verbal discussion, and written material to support subject matter. There is an overview of "What is Oxygen" and "Why do we need it".  Instructor also reviews how to create a safe environment for oxygen use, the importance of using oxygen as prescribed, and the risks of noncompliance. There is a brief discussion on traveling with oxygen and resources the patient may utilize.   Oxygen Equipment:  -Group instruction provided by [facility] Staff utilizing handouts, written materials, and equipment demonstrations.   Signs and Symptoms:  -Group instruction provided by written material and verbal discussion to support subject matter. Warning signs and symptoms of infection, stroke, and heart attack are reviewed and when to call the physician/911 reinforced. Tips for preventing the spread of infection discussed.   Advanced Directives:  -Group instruction provided by verbal instruction and written material to support subject matter. Instructor reviews Advanced Directive laws and proper instruction for filling out document.   Pulmonary Video:  -Group video education that reviews the importance of medication and oxygen compliance, exercise, good nutrition, pulmonary  hygiene, and pursed lip and diaphragmatic breathing for the pulmonary patient.   Exercise for the Pulmonary Patient:  -Group instruction that is supported by a PowerPoint presentation. Instructor discusses benefits of exercise, core components of exercise, frequency, duration, and intensity of an exercise routine, importance of utilizing pulse oximetry during exercise, safety while exercising, and options of places to exercise outside of rehab.     Pulmonary Medications:  -Verbally interactive group education  provided by instructor with focus on inhaled medications and proper administration.   Anatomy and Physiology of the Respiratory System and Intimacy:  -Group instruction provided by PowerPoint, verbal discussion, and written material to support subject matter. Instructor reviews respiratory cycle and anatomical components of the respiratory system and their functions. Instructor also reviews differences in obstructive and restrictive respiratory diseases with examples of each. Intimacy, Sex, and Sexuality differences are reviewed with a discussion on how relationships can change when diagnosed with pulmonary disease. Common sexual concerns are reviewed.   MD DAY -A group question and answer session with a medical doctor that allows participants to ask questions that relate to their pulmonary disease state.   OTHER EDUCATION -Group or individual verbal, written, or video instructions that support the educational goals of the pulmonary rehab program.   Holiday Eating Survival Tips:  -Group instruction provided by PowerPoint slides, verbal discussion, and written materials to support subject matter. The instructor gives patients tips, tricks, and techniques to help them not only survive but enjoy the holidays despite the onslaught of food that accompanies the holidays.   Knowledge Questionnaire Score:  Knowledge Questionnaire Score - 03/30/21 1313       Knowledge Questionnaire Score   Pre Score 18/18             Core Components/Risk Factors/Patient Goals at Admission:  Personal Goals and Risk Factors at Admission - 03/30/21 1400       Core Components/Risk Factors/Patient Goals on Admission    Weight Management Obesity    Intervention Weight Management: Develop a combined nutrition and exercise program designed to reach desired caloric intake, while maintaining appropriate intake of nutrient and fiber, sodium and fats, and appropriate energy expenditure required for the weight  goal.;Weight Management: Provide education and appropriate resources to help participant work on and attain dietary goals.;Weight Management/Obesity: Establish reasonable short term and long term weight goals.;Obesity: Provide education and appropriate resources to help participant work on and attain dietary goals.    Admit Weight 233 lb 11 oz (106 kg)             Core Components/Risk Factors/Patient Goals Review:    Core Components/Risk Factors/Patient Goals at Discharge (Final Review):    ITP Comments:   Comments:

## 2021-04-03 ENCOUNTER — Encounter (HOSPITAL_COMMUNITY)
Admission: RE | Admit: 2021-04-03 | Discharge: 2021-04-03 | Disposition: A | Discharge status: Home / Self Care | Payer: 59 | Source: Ambulatory Visit | Attending: Pulmonary Disease | Admitting: Pulmonary Disease

## 2021-04-03 ENCOUNTER — Other Ambulatory Visit: Payer: Self-pay

## 2021-04-03 DIAGNOSIS — J1282 Pneumonia due to coronavirus disease 2019: Secondary | ICD-10-CM

## 2021-04-03 DIAGNOSIS — J984 Other disorders of lung: Secondary | ICD-10-CM | POA: Diagnosis not present

## 2021-04-03 DIAGNOSIS — U099 Post covid-19 condition, unspecified: Secondary | ICD-10-CM

## 2021-04-03 LAB — GLUCOSE, CAPILLARY: Glucose-Capillary: 165 mg/dL — ABNORMAL HIGH (ref 70–99)

## 2021-04-03 NOTE — Progress Notes (Signed)
Daily Session Note  Patient Details  Name: Clayton [name] MRN: [id number] Date of Birth: 19-Jul-1956 Referring Provider:   April Manson Pulmonary Rehab Walk Test from 03/30/2021 in [place]  Referring Provider Dr. Erin Fulling       Encounter Date: 04/03/2021  Check In:  Session Check In - 04/03/21 1149       Check-In   Supervising physician immediately available to respond to emergencies [facility] immediately available    Physician(s) Dr. Doristine Bosworth    Location [facility]    Staff Present Elmon Else, MS, ACSM-CEP, Exercise Physiologist;Joan Leonia Reeves, RN, Deland Pretty, MS, ACSM CEP, Exercise Physiologist;Lisa Ysidro Evert, RN    Virtual Visit No    Medication changes reported     No    Fall or balance concerns reported    No    Tobacco Cessation No Change    Warm-up and Cool-down Performed as group-led instruction    Resistance Training Performed No    VAD Patient? No      Pain Assessment   Currently in Pain? Yes    Pain Score 2     Pain Location Back   Pt states L5 vertebrae is slightly forward   Pain Orientation Lower    Pain Descriptors / Indicators --    Pain Type Chronic pain    Pain Onset More than a month ago    Multiple Pain Sites No             Capillary Blood Glucose: Results for orders placed or performed during the hospital encounter of 04/03/21 (from the past 24 hour(s))  Glucose, capillary     Status: Abnormal   Collection Time: 04/03/21 10:21 AM  Result Value Ref Range   Glucose-Capillary 165 (H) 70 - 99 mg/dL      Social History   Tobacco Use  Smoking Status Never  Smokeless Tobacco Never    Goals Met:  Proper associated with RPD/PD & O2 Sat Exercise tolerated well No report of concerns or symptoms today Strength training completed today  Goals Unmet:  Not Applicable  Comments: Service time is from 1025 to 1135 Pt first exercise session was today. Pt did well on NuStep and with  walking and banded exercises.     Dr. Fransico Him is Medical Director for Cardiac Rehab at [facility].

## 2021-04-04 LAB — GLUCOSE, CAPILLARY: Glucose-Capillary: 191 mg/dL — ABNORMAL HIGH (ref 70–99)

## 2021-04-05 ENCOUNTER — Encounter (HOSPITAL_COMMUNITY)
Admission: RE | Admit: 2021-04-05 | Discharge: 2021-04-05 | Disposition: A | Discharge status: Home / Self Care | Payer: 59 | Source: Ambulatory Visit | Attending: Pulmonary Disease | Admitting: Pulmonary Disease

## 2021-04-05 ENCOUNTER — Other Ambulatory Visit: Payer: Self-pay

## 2021-04-05 VITALS — Wt 235.2 lb

## 2021-04-05 DIAGNOSIS — U099 Post covid-19 condition, unspecified: Secondary | ICD-10-CM | POA: Diagnosis present

## 2021-04-05 DIAGNOSIS — J1282 Pneumonia due to coronavirus disease 2019: Secondary | ICD-10-CM | POA: Insufficient documentation

## 2021-04-05 LAB — GLUCOSE, CAPILLARY
Glucose-Capillary: 168 mg/dL — ABNORMAL HIGH (ref 70–99)
Glucose-Capillary: 113 mg/dL — ABNORMAL HIGH (ref 70–99)

## 2021-04-05 NOTE — Progress Notes (Signed)
Segundo Nevel 65 y.o. male Nutrition Note  Diagnosis: post covid19  Past Medical History:  Diagnosis Date   Arthritis    Asthma    pulmonary allergies- no asthma per pt   Atrophic condition of skin    Cellulitis/abscess - trunk    Cyst    shoulder   Diabetes mellitus without complication (HCC)    Hyperlipidemia    Hypertension    Hypertrophic condition of skin    Lipoma    Obesity    Pneumonia    hx child   Sleep apnea    no cpap   Stones in the urinary tract    Vitamin D deficiency      Medications reviewed.   Current Outpatient Medications:    acetaminophen (TYLENOL) 325 MG tablet, Take 2 tablets (650 mg total) by mouth every 6 (six) hours as needed for fever., Disp: , Rfl:    albuterol (VENTOLIN HFA) 108 (90 Base) MCG/ACT inhaler, Inhale 2 puffs into the lungs every 4 (four) hours as needed for wheezing or shortness of breath., Disp: 1 each, Rfl: 0   Apixaban (ELIQUIS PO), Take 1 tablet by mouth in the morning and at bedtime., Disp: , Rfl:    atorvastatin (LIPITOR) 40 MG tablet, Take 1 tablet by mouth daily., Disp: , Rfl:    clonazePAM (KLONOPIN) 0.5 MG tablet, Take 0.5 mg by mouth 3 (three) times daily as needed., Disp: , Rfl:    ipratropium-albuterol (DUONEB) 0.5-2.5 (3) MG/3ML SOLN, , Disp: , Rfl:    metFORMIN (GLUCOPHAGE-XR) 750 MG 24 hr tablet, metformin ER 750 mg tablet,extended release 24 hr, Disp: , Rfl:    montelukast (SINGULAIR) 10 MG tablet, Take 10 mg by mouth daily., Disp: , Rfl:    sertraline (ZOLOFT) 100 MG tablet, Take 100 mg by mouth daily., Disp: , Rfl:    sildenafil (REVATIO) 20 MG tablet, SMARTSIG:1-5 Tablet(s) By Mouth Daily PRN (Patient not taking: Reported on 03/30/2021), Disp: , Rfl:    sitaGLIPtin (JANUVIA) 100 MG tablet, Take 1 tablet by mouth daily., Disp: , Rfl:    sodium chloride (OCEAN) 0.65 % SOLN nasal spray, Place 1 spray into both nostrils as needed for congestion (nose irritation, give 1st for congestion)., Disp: 30 mL, Rfl: 0    terbinafine (LAMISIL) 250 MG tablet, Take 250 mg by mouth daily. (Patient not taking: Reported on 03/30/2021), Disp: , Rfl:    Ht Readings from Last 1 Encounters:  03/30/21 '5\' 7"'$  (1.702 m)     Wt Readings from Last 3 Encounters:  03/30/21 233 lb 11 oz (106 kg)  01/15/21 236 lb (107 kg)  01/12/21 235 lb 9.6 oz (106.9 kg)     There is no height or weight on file to calculate BMI.   Social History   Tobacco Use  Smoking Status Never  Smokeless Tobacco Never      Nutrition Note  Spoke with pt. Nutrition Plan and Nutrition Survey goals reviewed with pt.   Pt has Type 2 Diabetes. Last A1c indicates blood glucose well-controlled. Pt checks CBG's 1 times a day. Fasting CBG's reportedly 98-130 mg/dL. He reports his last A1C was <6%. He states he has been diagnosed with diabetes for 2-3 years. He   Per EMR, pt lost >30 lbs during hospitalization for covid19. He has gained weight over the past year during recovery. He now wants to lose about 30-40 lbs with a goal weight of 190 lbs.  He reports anxiety and depression and eating for emotional support at  times. He enjoys fast food about 3 days for lunch. His wife is good support and cooks healthy meals for dinner. We discussed packing lunches when he goes out for the day.  We also discussed the diabetes plate method for meal planning. Reviewed macronutrients and reducing carb and/or fat intake. Reviewed portions and ways to estimate.   Pt expressed understanding of the information reviewed.    Nutrition Diagnosis  Obese   II = 35-39.9 related to excessive energy intake as evidenced by a BMI 36.60 kg/m2   Nutrition Intervention Pt's individual nutrition plan reviewed with pt. Benefits of adopting healthy diet reviewed with Picture My Plate survey   Pt given handouts for: ? Diabetes plate method  Continue client-centered nutrition education by RD, as part of interdisciplinary care.  Goal(s) Pt to identify food quantities necessary to  achieve weight loss of 6-24 lb at graduation from pulmonary rehab.  Pt to build a healthy plate including vegetables, fruits, whole grains, and low-fat dairy products in a heart healthy meal plan. Pt to eat out/eat fast food less than 2 times per week  Plan:  Will provide client-centered nutrition education as part of interdisciplinary care Monitor and evaluate progress toward nutrition goal with team.  Michaele Offer, MS, RDN, LDN, CDCES

## 2021-04-05 NOTE — Progress Notes (Signed)
Daily Session Note  Patient Details  Name: Clayton [name] MRN: [id number] Date of Birth: 11-01-1955 Referring Provider:   April Manson Pulmonary Rehab Walk Test from 03/30/2021 in [place]  Referring Provider Dr. Erin Fulling       Encounter Date: 04/05/2021  Check In:  Session Check In - 04/05/21 1144       Check-In   Supervising physician immediately available to respond to emergencies [facility] immediately available    Physician(s) Dr. Doristine Bosworth    Location [facility]    Staff Present Elmon Else, MS, ACSM-CEP, Exercise Physiologist;Alvine Mostafa Leonia Reeves, RN, Roque Cash, RN    Virtual Visit No    Medication changes reported     No    Fall or balance concerns reported    No    Tobacco Cessation No Change    Warm-up and Cool-down Performed as group-led instruction    Resistance Training Performed No    VAD Patient? No      Pain Assessment   Currently in Pain? Yes    Pain Score 5     Pain Location Back    Pain Orientation Posterior    Pain Type Chronic pain    Pain Onset More than a month ago    Pain Frequency Constant    Multiple Pain Sites No             Capillary Blood Glucose: Results for orders placed or performed during the hospital encounter of 04/05/21 (from the past 24 hour(s))  Glucose, capillary     Status: Abnormal   Collection Time: 04/05/21 10:27 AM  Result Value Ref Range   Glucose-Capillary 168 (H) 70 - 99 mg/dL  Glucose, capillary     Status: Abnormal   Collection Time: 04/05/21 11:30 AM  Result Value Ref Range   Glucose-Capillary 113 (H) 70 - 99 mg/dL      Social History   Tobacco Use  Smoking Status Never  Smokeless Tobacco Never    Goals Met:  Proper associated with RPD/PD & O2 Sat Exercise tolerated well No report of concerns or symptoms today Strength training completed today  Goals Unmet:  Not Applicable  Comments: Service time is from 1030 to 1132.    Dr. Fransico Him  is Medical Director for Cardiac Rehab at [facility].

## 2021-04-10 ENCOUNTER — Encounter (HOSPITAL_COMMUNITY): Payer: 59

## 2021-04-10 ENCOUNTER — Telehealth (HOSPITAL_COMMUNITY): Payer: Self-pay | Admitting: Family Medicine

## 2021-04-12 ENCOUNTER — Encounter (HOSPITAL_COMMUNITY)
Admission: RE | Admit: 2021-04-12 | Discharge: 2021-04-12 | Disposition: A | Discharge status: Home / Self Care | Payer: 59 | Source: Ambulatory Visit | Attending: Pulmonary Disease | Admitting: Pulmonary Disease

## 2021-04-12 ENCOUNTER — Other Ambulatory Visit: Payer: Self-pay

## 2021-04-12 DIAGNOSIS — U099 Post covid-19 condition, unspecified: Secondary | ICD-10-CM

## 2021-04-12 DIAGNOSIS — J1282 Pneumonia due to coronavirus disease 2019: Secondary | ICD-10-CM

## 2021-04-12 NOTE — Progress Notes (Signed)
"  Clayton [name]" arrived to exercise in pulmonary rehab today and reports have a sebaceous cyst removed from his back yesterday and was told to not use his arms very much.  He also was c/o pain in the incision area.  He was sent home and not allowed to exercise.

## 2021-04-17 ENCOUNTER — Encounter (HOSPITAL_COMMUNITY)
Admission: RE | Admit: 2021-04-17 | Discharge: 2021-04-17 | Disposition: A | Discharge status: Home / Self Care | Payer: 59 | Source: Ambulatory Visit | Attending: Pulmonary Disease | Admitting: Pulmonary Disease

## 2021-04-17 ENCOUNTER — Other Ambulatory Visit: Payer: Self-pay

## 2021-04-17 DIAGNOSIS — J1282 Pneumonia due to coronavirus disease 2019: Secondary | ICD-10-CM

## 2021-04-17 DIAGNOSIS — U099 Post covid-19 condition, unspecified: Secondary | ICD-10-CM

## 2021-04-17 NOTE — Progress Notes (Signed)
"  Clayton [name]" arrived to pulmonary rehab 15 minutes late and reported he did not eat breakfast.  He is a diabetic and department rules are that he cannot exercise without eating before coming to class.  He was sent home and reinforced he must eat before exercising in pulmonary rehab going forward. 1030-1040

## 2021-04-17 NOTE — Progress Notes (Deleted)
Pulmonary Individual Treatment Plan  Patient Details  Name: Clayton [name] MRN: [id number] Date of Birth: [birth year] Referring Provider:   April Manson Pulmonary Rehab Walk Test from 03/30/2021 in [place]  Referring Provider Dr. Erin Fulling       Initial Encounter Date:  Flowsheet Row Pulmonary Rehab Walk Test from 03/30/2021 in [place]  Date 03/30/21       Visit Diagnosis: Pneumonia due to coronavirus disease 2019 (CODE)  Post covid-19 condition, unspecified  Patient's Home Medications on Admission:   Current Outpatient Medications:    acetaminophen (TYLENOL) 325 MG tablet, Take 2 tablets (650 mg total) by mouth every 6 (six) hours as needed for fever., Disp: , Rfl:    albuterol (VENTOLIN HFA) 108 (90 Base) MCG/ACT inhaler, Inhale 2 puffs into the lungs every 4 (four) hours as needed for wheezing or shortness of breath., Disp: 1 each, Rfl: 0   Apixaban (ELIQUIS PO), Take 1 tablet by mouth in the morning and at bedtime., Disp: , Rfl:    atorvastatin (LIPITOR) 40 MG tablet, Take 1 tablet by mouth daily., Disp: , Rfl:    clonazePAM (KLONOPIN) 0.5 MG tablet, Take 0.5 mg by mouth 3 (three) times daily as needed., Disp: , Rfl:    ipratropium-albuterol (DUONEB) 0.5-2.5 (3) MG/3ML SOLN, , Disp: , Rfl:    metFORMIN (GLUCOPHAGE-XR) 750 MG 24 hr tablet, metformin ER 750 mg tablet,extended release 24 hr, Disp: , Rfl:    montelukast (SINGULAIR) 10 MG tablet, Take 10 mg by mouth daily., Disp: , Rfl:    sertraline (ZOLOFT) 100 MG tablet, Take 100 mg by mouth daily., Disp: , Rfl:    sildenafil (REVATIO) 20 MG tablet, SMARTSIG:1-5 Tablet(s) By Mouth Daily PRN (Patient not taking: Reported on 03/30/2021), Disp: , Rfl:    sitaGLIPtin (JANUVIA) 100 MG tablet, Take 1 tablet by mouth daily., Disp: , Rfl:    sodium chloride (OCEAN) 0.65 % SOLN nasal spray, Place 1 spray into both nostrils as needed for congestion (nose irritation, give 1st  for congestion)., Disp: 30 mL, Rfl: 0   terbinafine (LAMISIL) 250 MG tablet, Take 250 mg by mouth daily. (Patient not taking: Reported on 03/30/2021), Disp: , Rfl:   Past Medical History: Past Medical History:  Diagnosis Date   Arthritis    Asthma    pulmonary allergies- no asthma per pt   Atrophic condition of skin    Cellulitis/abscess - trunk    Cyst    shoulder   Diabetes mellitus without complication (HCC)    Hyperlipidemia    Hypertension    Hypertrophic condition of skin    Lipoma    Obesity    Pneumonia    hx child   Sleep apnea    no cpap   Stones in the urinary tract    Vitamin D deficiency     Tobacco Use: Social History   Tobacco Use  Smoking Status Never  Smokeless Tobacco Never    Labs: Recent Review Flowsheet Data     Labs for ITP Cardiac and Pulmonary Rehab Latest Ref Rng & Units 04/24/2020 04/25/2020 05/05/2020 05/16/2020 05/31/2020   Cholestrol 0 - 200 mg/dL - 169 - - -   LDLCALC 0 - 99 mg/dL - 112(H) - - -   LDLDIRECT mg/dL - - - - -   HDL >40 mg/dL - 32(L) - - -   Trlycerides <150 mg/dL 163(H) 123 - - -   Hemoglobin A1c 4.8 - 5.6 % -  8.4(H) - - 8.6(H)   PHART 7.350 - 7.450 - - 7.440 7.455(H) -   PCO2ART 32.0 - 48.0 mmHg - - 45.7 50.5(H) -   HCO3 20.0 - 28.0 mmol/L - - 30.6(H) 35.1(H) -   O2SAT % - - 96.4 86.6 -       Capillary Blood Glucose: Lab Results  Component Value Date   GLUCAP 113 (H) 04/05/2021   GLUCAP 168 (H) 04/05/2021   GLUCAP 191 (H) 04/03/2021   GLUCAP 165 (H) 04/03/2021   GLUCAP 219 (H) 05/30/2020    POCT Glucose     Row Name 04/10/21 1140             POCT Blood Glucose   Pre-Exercise 168 mg/dL       Post-Exercise 113 mg/dL                Pulmonary Assessment Scores:  Pulmonary Assessment Scores     Row Name 03/30/21 1205         ADL UCSD   ADL Phase Entry     SOB Score total 54           CAT Score   CAT Score 22 - pre           mMRC Score   mMRC Score 4              UCSD: Self-administered rating of dyspnea associated with activities of daily living (ADLs) 6-point scale (0 = "not at all" to 5 = "maximal or unable to do because of breathlessness")  Scoring Scores range from 0 to 120.  Minimally important difference is 5 units  CAT: CAT can identify the health impairment of COPD patients and is better correlated with disease progression.  CAT has a scoring range of zero to 40. The CAT score is classified into four groups of low (less than 10), medium (10 - 20), high (21-30) and very high (31-40) based on the impact level of disease on health status. A CAT score over 10 suggests significant symptoms.  A worsening CAT score could be explained by an exacerbation, poor medication adherence, poor inhaler technique, or progression of COPD or comorbid conditions.  CAT MCID is 2 points  mMRC: mMRC (Modified Medical Research Council) Dyspnea Scale is used to assess the degree of baseline functional disability in patients of respiratory disease due to dyspnea. No minimal important difference is established. A decrease in score of 1 point or greater is considered a positive change.   Pulmonary Function Assessment:   Exercise Target Goals: Exercise Program Goal: Individual exercise prescription set using results from initial 6 min walk test and THRR while considering  patient's activity barriers and safety.   Exercise Prescription Goal: Initial exercise prescription builds to 30-45 minutes a day of aerobic activity, 2-3 days per week.  Home exercise guidelines will be given to patient during program as part of exercise prescription that the participant will acknowledge.  Activity Barriers & Risk Stratification:  Activity Barriers & Cardiac Risk Stratification - 03/30/21 1151       Activity Barriers & Cardiac Risk Stratification   Activity Barriers Back Problems;Deconditioning;Shortness of Breath;History of Falls;Balance Concerns    Cardiac Risk  Stratification Moderate             6 Minute Walk:  6 Minute Walk     Row Name 03/30/21 1328         6 Minute Walk   Phase Initial     Distance 1460  feet     Walk Time 6 minutes     # of Rest Breaks 0     MPH 2.77     METS 3.3     RPE 14     Perceived Dyspnea  1     VO2 Peak 11.54     Symptoms Yes (comment)     Comments Back pain 7/10     Resting HR 77 bpm     Resting BP 140/68     Resting Oxygen Saturation  95 %     Exercise Oxygen Saturation  during 6 min walk 90 %     Max Ex. HR 112 bpm     Max Ex. BP 150/70     2 Minute Post BP 142/70           Interval HR   1 Minute HR 102     2 Minute HR 105     3 Minute HR 112     4 Minute HR 104     5 Minute HR 107     6 Minute HR 111     2 Minute Post HR 74     Interval Heart Rate? Yes           Interval Oxygen   Interval Oxygen? Yes     Baseline Oxygen Saturation % 95 %     1 Minute Oxygen Saturation % 91 %     1 Minute Liters of Oxygen 0 L     2 Minute Oxygen Saturation % 91 %     2 Minute Liters of Oxygen 0 L     3 Minute Oxygen Saturation % 90 %     3 Minute Liters of Oxygen 0 L     4 Minute Oxygen Saturation % 91 %     4 Minute Liters of Oxygen 0 L     5 Minute Oxygen Saturation % 90 %     5 Minute Liters of Oxygen 0 L     6 Minute Oxygen Saturation % 91 %     6 Minute Liters of Oxygen 0 L     2 Minute Post Oxygen Saturation % 95 %     2 Minute Post Liters of Oxygen 74 L              Oxygen Initial Assessment:  Oxygen Initial Assessment - 04/12/21 1643       Home Oxygen   Home Oxygen Device Home Concentrator;Portable Concentrator;E-Tanks   [employer]   Sleep Oxygen Prescription CPAP;Continuous    Liters per minute 2    Home Exercise Oxygen Prescription None    Home Resting Oxygen Prescription Continuous    Liters per minute 2      Initial 6 min Walk   Oxygen Used None      Program Oxygen Prescription   Program Oxygen Prescription None      Intervention   Short Term Goals To  learn and exhibit compliance with exercise, home and travel O2 prescription;To learn and understand importance of monitoring SPO2 with pulse oximeter and demonstrate accurate use of the pulse oximeter.;To learn and understand importance of maintaining oxygen saturations>88%;To learn and demonstrate proper pursed lip breathing techniques or other breathing techniques. ;To learn and demonstrate proper use of respiratory medications    Long  Term Goals Exhibits compliance with exercise, home  and travel O2 prescription;Verbalizes importance of monitoring SPO2 with pulse oximeter and return demonstration;Maintenance of O2 saturations>88%;Exhibits proper breathing techniques,  such as pursed lip breathing or other method taught during program session;Compliance with respiratory medication;Demonstrates proper use of MDI's             Oxygen Re-Evaluation:  Oxygen Re-Evaluation     Row Name 04/12/21 1643             Goals/Expected Outcomes   Goals/Expected Outcomes Compliance and understanding of monitoring oxygen saturation and importance of pursed lip breathing                Oxygen Discharge (Final Oxygen Re-Evaluation):  Oxygen Re-Evaluation - 04/12/21 1643       Goals/Expected Outcomes   Goals/Expected Outcomes Compliance and understanding of monitoring oxygen saturation and importance of pursed lip breathing             Initial Exercise Prescription:  Initial Exercise Prescription - 03/30/21 1300       Date of Initial Exercise RX and Referring Provider   Date 03/30/21    Referring Provider Dr. Erin Fulling    Expected Discharge Date 05/31/21      NuStep   Level 2    SPM 80    Minutes 15      Track   Minutes 15      Prescription Details   Frequency (times per week) 2    Duration Progress to 30 minutes of continuous aerobic without signs/symptoms of physical distress      Intensity   THRR 40-80% of Max Heartrate 63-126    Ratings of Perceived Exertion 11-13     Perceived Dyspnea 0-4      Progression   Progression Continue to progress workloads to maintain intensity without signs/symptoms of physical distress.      Resistance Training   Training Prescription Yes    Weight Blue bands    Reps 10-15             Perform Capillary Blood Glucose checks as needed.  Exercise Prescription Changes:   Exercise Prescription Changes     Row Name 04/05/21 1140             Response to Exercise   Blood Pressure (Admit) 148/70       Blood Pressure (Exercise) 142/70       Blood Pressure (Exit) 130/64       Heart Rate (Admit) 88 bpm       Heart Rate (Exercise) 91 bpm       Heart Rate (Exit) 80 bpm       Oxygen Saturation (Admit) 95 %       Oxygen Saturation (Exercise) 95 %       Oxygen Saturation (Exit) 96 %       Rating of Perceived Exertion (Exercise) 12       Perceived Dyspnea (Exercise) 2       Duration Continue with 30 min of aerobic exercise without signs/symptoms of physical distress.       Intensity --  40-80% HRR               Resistance Training   Training Prescription Yes       Weight Blue Bands       Reps 10-15       Time 10 Minutes               NuStep   Level 2       SPM 80       Minutes 30       METs 1.7  did  not walk on track d/t nutrition consult                Exercise Comments:   Exercise Comments     Row Name 04/03/21 1215           Exercise Comments Pt completed first day of exercise. Pt tolerated exercise on the NuStep and track well. Pt averaged 2.2 METs on the NuStep and completed 5 laps. Pt updated nurse on health history and had to set up his phone before walking. Pt completed resistance band and cool down exercises without issues.                Exercise Goals and Review:   Exercise Goals     Row Name 03/30/21 1152 04/12/21 1645           Exercise Goals   Increase Physical Activity Yes Yes      Intervention Develop an individualized exercise prescription for aerobic and  resistive training based on initial evaluation findings, risk stratification, comorbidities and participant's personal goals.;Provide advice, education, support and counseling about physical activity/exercise needs. Develop an individualized exercise prescription for aerobic and resistive training based on initial evaluation findings, risk stratification, comorbidities and participant's personal goals.;Provide advice, education, support and counseling about physical activity/exercise needs.      Expected Outcomes Short Term: Attend rehab on a regular basis to increase amount of physical activity.;Long Term: Add in home exercise to make exercise part of routine and to increase amount of physical activity.;Long Term: Exercising regularly at least 3-5 days a week. Short Term: Attend rehab on a regular basis to increase amount of physical activity.;Long Term: Add in home exercise to make exercise part of routine and to increase amount of physical activity.;Long Term: Exercising regularly at least 3-5 days a week.      Increase Strength and Stamina Yes Yes      Intervention Provide advice, education, support and counseling about physical activity/exercise needs.;Develop an individualized exercise prescription for aerobic and resistive training based on initial evaluation findings, risk stratification, comorbidities and participant's personal goals. Provide advice, education, support and counseling about physical activity/exercise needs.;Develop an individualized exercise prescription for aerobic and resistive training based on initial evaluation findings, risk stratification, comorbidities and participant's personal goals.      Expected Outcomes Short Term: Increase workloads from initial exercise prescription for resistance, speed, and METs.;Short Term: Perform resistance training exercises routinely during rehab and add in resistance training at home;Long Term: Improve cardiorespiratory fitness, muscular endurance  and strength as measured by increased METs and functional capacity (6MWT) Short Term: Increase workloads from initial exercise prescription for resistance, speed, and METs.;Short Term: Perform resistance training exercises routinely during rehab and add in resistance training at home;Long Term: Improve cardiorespiratory fitness, muscular endurance and strength as measured by increased METs and functional capacity (6MWT)      Able to understand and use rate of perceived exertion (RPE) scale Yes Yes      Intervention Provide education and explanation on how to use RPE scale Provide education and explanation on how to use RPE scale      Expected Outcomes Short Term: Able to use RPE daily in rehab to express subjective intensity level;Long Term:  Able to use RPE to guide intensity level when exercising independently Short Term: Able to use RPE daily in rehab to express subjective intensity level;Long Term:  Able to use RPE to guide intensity level when exercising independently      Able to understand and  use Dyspnea scale Yes Yes      Intervention Provide education and explanation on how to use Dyspnea scale Provide education and explanation on how to use Dyspnea scale      Expected Outcomes Short Term: Able to use Dyspnea scale daily in rehab to express subjective sense of shortness of breath during exertion;Long Term: Able to use Dyspnea scale to guide intensity level when exercising independently Short Term: Able to use Dyspnea scale daily in rehab to express subjective sense of shortness of breath during exertion;Long Term: Able to use Dyspnea scale to guide intensity level when exercising independently      Knowledge and understanding of Target Heart Rate Range (THRR) Yes Yes      Intervention Provide education and explanation of THRR including how the numbers were predicted and where they are located for reference Provide education and explanation of THRR including how the numbers were predicted and where  they are located for reference      Expected Outcomes Short Term: Able to state/look up THRR;Short Term: Able to use daily as guideline for intensity in rehab;Long Term: Able to use THRR to govern intensity when exercising independently Short Term: Able to state/look up THRR;Short Term: Able to use daily as guideline for intensity in rehab;Long Term: Able to use THRR to govern intensity when exercising independently      Able to check pulse independently Yes Yes      Intervention Provide education and demonstration on how to check pulse in carotid and radial arteries.;Review the importance of being able to check your own pulse for safety during independent exercise Provide education and demonstration on how to check pulse in carotid and radial arteries.;Review the importance of being able to check your own pulse for safety during independent exercise      Expected Outcomes Short Term: Able to explain why pulse checking is important during independent exercise;Long Term: Able to check pulse independently and accurately Short Term: Able to explain why pulse checking is important during independent exercise;Long Term: Able to check pulse independently and accurately      Understanding of Exercise Prescription Yes Yes      Intervention Provide education, explanation, and written materials on patient's individual exercise prescription Provide education, explanation, and written materials on patient's individual exercise prescription      Expected Outcomes Short Term: Able to explain program exercise prescription;Long Term: Able to explain home exercise prescription to exercise independently Short Term: Able to explain program exercise prescription;Long Term: Able to explain home exercise prescription to exercise independently               Exercise Goals Re-Evaluation :  Exercise Goals Re-Evaluation     [place] Name 04/12/21 1645             Exercise Goal Re-Evaluation   Exercise Goals Review  Increase Physical Activity;Increase Strength and Stamina;Able to understand and use rate of perceived exertion (RPE) scale;Able to understand and use Dyspnea scale;Knowledge and understanding of Target Heart Rate Range (THRR);Understanding of Exercise Prescription       Comments Clayton [name] has completed 2 exercises sessions. He has tolerated exercise fair on the Nustep and track. He averaged 2.2 METs at level 2 on the Nustep and 5 laps on the track. 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 I told him I would adjust his exercise prescription as necessary. He is motivated to exercise. He completes all of the warmup exercises  standing up and some of the cooldown exercises sitting down because of his chronic back pain. Will continue to monitor and progress as able.       Expected Outcomes Through exercise at rehab and home, the pt will decrease SOB with daily activities and feel confident in carrying out an exercise regimen at home.                Discharge Exercise Prescription (Final Exercise Prescription Changes):  Exercise Prescription Changes - 04/05/21 1140       Response to Exercise   Blood Pressure (Admit) 148/70    Blood Pressure (Exercise) 142/70    Blood Pressure (Exit) 130/64    Heart Rate (Admit) 88 bpm    Heart Rate (Exercise) 91 bpm    Heart Rate (Exit) 80 bpm    Oxygen Saturation (Admit) 95 %    Oxygen Saturation (Exercise) 95 %    Oxygen Saturation (Exit) 96 %    Rating of Perceived Exertion (Exercise) 12    Perceived Dyspnea (Exercise) 2    Duration Continue with 30 min of aerobic exercise without signs/symptoms of physical distress.    Intensity --   40-80% HRR     Resistance Training   Training Prescription Yes    Weight Blue Bands    Reps 10-15    Time 10 Minutes      NuStep   Level 2    SPM 80    Minutes 30    METs 1.7   did not walk on track d/t nutrition consult            Nutrition:  Target Goals:  Understanding of nutrition guidelines, daily intake of sodium '1500mg'$ , cholesterol '200mg'$ , calories 30% from fat and 7% or less from saturated fats, daily to have 5 or more servings of fruits and vegetables.  Biometrics:  Pre Biometrics - 03/30/21 1351       Pre Biometrics   Height $Remov'5\' 7"'XTYkEQ$  (1.702 m)    Weight 106 kg    BMI (Calculated) 36.59    Grip Strength 20 kg              Nutrition Therapy Plan and Nutrition Goals:  Nutrition Therapy & Goals - 04/05/21 1205       Nutrition Therapy   Diet General Healthful      Personal Nutrition Goals   Nutrition Goal Pt to identify food quantities necessary to achieve weight loss of 6-24 lb at graduation from pulmonary rehab.    Personal Goal #2 Pt to build a healthy plate including vegetables, fruits, whole grains, and low-fat dairy products in a heart healthy meal plan.    Personal Goal #3 Pt to eat out/eat fast food less than 2 times per week      Intervention Plan   Intervention Prescribe, educate and counsel regarding individualized specific dietary modifications aiming towards targeted core components such as weight, hypertension, lipid management, diabetes, heart failure and other comorbidities.;Nutrition handout(s) given to patient.    Expected Outcomes Long Term Goal: Adherence to prescribed nutrition plan.;Short Term Goal: A plan has been developed with personal nutrition goals set during dietitian appointment.             Nutrition Assessments:  MEDIFICTS Score Key: ?70 Need to make dietary changes  40-70 Heart Healthy Diet ? 40 Therapeutic Level Cholesterol Diet   Picture Your Plate Scores: <85 Unhealthy dietary pattern with much room for improvement. 41-50 Dietary pattern unlikely to meet recommendations for good  health and room for improvement. 51-60 More healthful dietary pattern, with some room for improvement.  >60 Healthy dietary pattern, although there may be some specific behaviors that could be improved.     Nutrition Goals Re-Evaluation:  Nutrition Goals Re-Evaluation     [place] Name 04/05/21 1206             Goals   Current Weight 233 lb (105.7 kg)       Nutrition Goal Pt to identify food quantities necessary to achieve weight loss of 6-24 lb at graduation from pulmonary rehab.               Personal Goal #2 Re-Evaluation   Personal Goal #2 Pt to build a healthy plate including vegetables, fruits, whole grains, and low-fat dairy products in a heart healthy meal plan.               Personal Goal #3 Re-Evaluation   Personal Goal #3 Pt to eat out/eat fast food less than 2 times per week                Nutrition Goals Discharge (Final Nutrition Goals Re-Evaluation):  Nutrition Goals Re-Evaluation - 04/05/21 1206       Goals   Current Weight 233 lb (105.7 kg)    Nutrition Goal Pt to identify food quantities necessary to achieve weight loss of 6-24 lb at graduation from pulmonary rehab.      Personal Goal #2 Re-Evaluation   Personal Goal #2 Pt to build a healthy plate including vegetables, fruits, whole grains, and low-fat dairy products in a heart healthy meal plan.      Personal Goal #3 Re-Evaluation   Personal Goal #3 Pt to eat out/eat fast food less than 2 times per week             Psychosocial: Target Goals: Acknowledge presence or absence of significant depression and/or stress, maximize coping skills, provide positive support system. Participant is able to verbalize types and ability to use techniques and skills needed for reducing stress and depression.  Initial Review & Psychosocial Screening:  Initial Psych Review & Screening - 03/30/21 1058       Initial Review   Current issues with Current Depression;Current Anxiety/Panic;Current Stress Concerns    Source of Stress Concerns Chronic Illness;Family;Unable to participate in former interests or hobbies;Unable to perform yard/household activities;Financial    Comments Clayton [name] at times feels overwhelmed by  dependence of his family.  His wife does not work or help keep the house clean. His 40 year old mother who has memory issues lives with him and requires physical help.  Clayton [name] 36 year old son who has Autism and ADHD acts out often and struggles with normal adolecent issues. Although Clayton [name] does acknowledge that if he needs something they will help but in the areas that they could help themselves they do not.      Family Dynamics   Good Support System? No    Strains Illness and family care strain;Intra-family strains    Concerns Inappropriate over/under dependence on family/friends      Barriers   Psychosocial barriers to participate in program The patient should benefit from training in stress management and relaxation.      Screening Interventions   Interventions Encouraged to exercise;To provide support and resources with identified psychosocial needs    Expected Outcomes Long Term Goal: Stressors or current issues are controlled or eliminated.;Short Term goal: Identification and review with participant of any Quality of Life  or Depression concerns found by scoring the questionnaire.;Long Term goal: The participant improves quality of Life and PHQ9 Scores as seen by post scores and/or verbalization of changes             Quality of Life Scores:  Scores of 19 and below usually indicate a poorer quality of life in these areas.  A difference of  2-3 points is a clinically meaningful difference.  A difference of 2-3 points in the total score of the Quality of Life Index has been associated with significant improvement in overall quality of life, self-image, physical symptoms, and general health in studies assessing change in quality of life.  PHQ-9: Recent Review Flowsheet Data     Depression screen [facility] 2/9 03/30/2021 08/25/2015 08/16/2015 08/07/2015 08/04/2015   Decreased Interest 2 0 0 0 0   Down, Depressed, Hopeless 2 0 0 0 0   PHQ - 2 Score 4 0 0 0 0   Altered sleeping 3 - - - -   Tired,  decreased energy 3 - - - -   Change in appetite 0 - - - -   Feeling bad or failure about yourself  0 - - - -   Trouble concentrating 2 - - - -   Moving slowly or fidgety/restless 0 - - - -   Suicidal thoughts 0 - - - -   PHQ-9 Score 12 - - - -   Difficult doing work/chores Very difficult - - - -      Interpretation of Total Score  Total Score Depression Severity:  1-4 = Minimal depression, 5-9 = Mild depression, 10-14 = Moderate depression, 15-19 = Moderately severe depression, 20-27 = Severe depression   Psychosocial Evaluation and Intervention:   Psychosocial Re-Evaluation:  Psychosocial Re-Evaluation     Row Name 04/16/21 1038             Psychosocial Re-Evaluation   Current issues with Current Depression;Current Anxiety/Panic;Current Stress Concerns       Comments He has attended 2 exercise sessions, no change in psychosocial concerns since orientation.  He does show anxiety when coming to class, he has chronic back pain which makes exercise uncomfortable for him.  He has stressors at home, his 61 y/o mother and autistic son who live with him and his wife.       Expected Outcomes For Clayton [name] to learn methods of relaxation and to handle stress in healthy ways.       Interventions Encouraged to attend Pulmonary Rehabilitation for the exercise;Relaxation education;Stress management education       Continue Psychosocial Services  Follow up required by staff               Initial Review   Source of Stress Concerns Chronic Illness;Unable to participate in former interests or hobbies;Family;Unable to perform yard/household activities                Psychosocial Discharge (Final Psychosocial Re-Evaluation):  Psychosocial Re-Evaluation - 04/16/21 1038       Psychosocial Re-Evaluation   Current issues with Current Depression;Current Anxiety/Panic;Current Stress Concerns    Comments He has attended 2 exercise sessions, no change in psychosocial concerns since orientation.  He  does show anxiety when coming to class, he has chronic back pain which makes exercise uncomfortable for him.  He has stressors at home, his 21 y/o mother and autistic son who live with him and his wife.    Expected Outcomes For Clayton [name] to learn methods of relaxation  and to handle stress in healthy ways.    Interventions Encouraged to attend Pulmonary Rehabilitation for the exercise;Relaxation education;Stress management education    Continue Psychosocial Services  Follow up required by staff      Initial Review   Source of Stress Concerns Chronic Illness;Unable to participate in former interests or hobbies;Family;Unable to perform yard/household activities             Education: Education Goals: Education classes will be provided on a weekly basis, covering required topics. Participant will state understanding/return demonstration of topics presented.  Learning Barriers/Preferences:  Learning Barriers/Preferences - 03/30/21 1119       Learning Barriers/Preferences   Learning Barriers Sight;Hearing;Exercise Concerns    Learning Preferences Audio;Computer/Internet;Group Instruction;Individual Instruction;Pictoral;Skilled Demonstration;Verbal Instruction;Video;Written Material             Education Topics: Risk Factor Reduction:  -Group instruction that is supported by a PowerPoint presentation. Instructor discusses the definition of a risk factor, different risk factors for pulmonary disease, and how the heart and lungs work together.   Flowsheet Row PULMONARY REHAB CHRONIC OBSTRUCTIVE PULMONARY DISEASE from 04/12/2021 in [place]  Date 04/12/21  Educator handout       Nutrition for Pulmonary Patient:  -Group instruction provided by PowerPoint slides, verbal discussion, and written materials to support subject matter. The instructor gives an explanation and review of healthy diet recommendations, which includes a discussion on weight management,  recommendations for fruit and vegetable consumption, as well as protein, fluid, caffeine, fiber, sodium, sugar, and alcohol. Tips for eating when patients are short of breath are discussed.   Pursed Lip Breathing:  -Group instruction that is supported by demonstration and informational handouts. Instructor discusses the benefits of pursed lip and diaphragmatic breathing and detailed demonstration on how to preform both.     Oxygen Safety:  -Group instruction provided by PowerPoint, verbal discussion, and written material to support subject matter. There is an overview of "What is Oxygen" and "Why do we need it".  Instructor also reviews how to create a safe environment for oxygen use, the importance of using oxygen as prescribed, and the risks of noncompliance. There is a brief discussion on traveling with oxygen and resources the patient may utilize.   Oxygen Equipment:  -Group instruction provided by [facility] Staff utilizing handouts, written materials, and equipment demonstrations.   Signs and Symptoms:  -Group instruction provided by written material and verbal discussion to support subject matter. Warning signs and symptoms of infection, stroke, and heart attack are reviewed and when to call the physician/911 reinforced. Tips for preventing the spread of infection discussed.   Advanced Directives:  -Group instruction provided by verbal instruction and written material to support subject matter. Instructor reviews Advanced Directive laws and proper instruction for filling out document.   Pulmonary Video:  -Group video education that reviews the importance of medication and oxygen compliance, exercise, good nutrition, pulmonary hygiene, and pursed lip and diaphragmatic breathing for the pulmonary patient.   Exercise for the Pulmonary Patient:  -Group instruction that is supported by a PowerPoint presentation. Instructor discusses benefits of exercise, core components of exercise,  frequency, duration, and intensity of an exercise routine, importance of utilizing pulse oximetry during exercise, safety while exercising, and options of places to exercise outside of rehab.     Pulmonary Medications:  -Verbally interactive group education provided by instructor with focus on inhaled medications and proper administration.   Anatomy and Physiology of the Respiratory System and Intimacy:  -  Group instruction provided by PowerPoint, verbal discussion, and written material to support subject matter. Instructor reviews respiratory cycle and anatomical components of the respiratory system and their functions. Instructor also reviews differences in obstructive and restrictive respiratory diseases with examples of each. Intimacy, Sex, and Sexuality differences are reviewed with a discussion on how relationships can change when diagnosed with pulmonary disease. Common sexual concerns are reviewed.   MD DAY -A group question and answer session with a medical doctor that allows participants to ask questions that relate to their pulmonary disease state.   OTHER EDUCATION -Group or individual verbal, written, or video instructions that support the educational goals of the pulmonary rehab program. [place] from 04/12/2021 in [place]  Date 04/05/21  Educator handout  [Beat a Sedentary Lifestyle]       Holiday Eating Survival Tips:  -Group instruction provided by PowerPoint slides, verbal discussion, and written materials to support subject matter. The instructor gives patients tips, tricks, and techniques to help them not only survive but enjoy the holidays despite the onslaught of food that accompanies the holidays.   Knowledge Questionnaire Score:  Knowledge Questionnaire Score - 03/30/21 1313       Knowledge Questionnaire Score   Pre Score 18/18             Core Components/Risk  Factors/Patient Goals at Admission:  Personal Goals and Risk Factors at Admission - 03/30/21 1400       Core Components/Risk Factors/Patient Goals on Admission    Weight Management Obesity    Intervention Weight Management: Develop a combined nutrition and exercise program designed to reach desired caloric intake, while maintaining appropriate intake of nutrient and fiber, sodium and fats, and appropriate energy expenditure required for the weight goal.;Weight Management: Provide education and appropriate resources to help participant work on and attain dietary goals.;Weight Management/Obesity: Establish reasonable short term and long term weight goals.;Obesity: Provide education and appropriate resources to help participant work on and attain dietary goals.    Admit Weight 233 lb 11 oz (106 kg)             Core Components/Risk Factors/Patient Goals Review:   Goals and Risk Factor Review     Row Name 04/16/21 1041             Core Components/Risk Factors/Patient Goals Review   Personal Goals Review Develop more efficient breathing techniques such as purse lipped breathing and diaphragmatic breathing and practicing self-pacing with activity.;Increase knowledge of respiratory medications and ability to use respiratory devices properly.;Improve shortness of breath with ADL's       Review Clayton [name] has attended 2 exercise sessions, too early to have improved shortness of breath and stamina.       Expected Outcomes See admission goals.                Core Components/Risk Factors/Patient Goals at Discharge (Final Review):   Goals and Risk Factor Review - 04/16/21 1041       Core Components/Risk Factors/Patient Goals Review   Personal Goals Review Develop more efficient breathing techniques such as purse lipped breathing and diaphragmatic breathing and practicing self-pacing with activity.;Increase knowledge of respiratory medications and ability to use respiratory devices  properly.;Improve shortness of breath with ADL's    Review Clayton [name] has attended 2 exercise sessions, too early to have improved shortness of breath and stamina.    Expected Outcomes See admission goals.  ITP Comments:   Comments:

## 2021-04-19 ENCOUNTER — Encounter (HOSPITAL_COMMUNITY)
Admission: RE | Admit: 2021-04-19 | Discharge: 2021-04-19 | Disposition: A | Discharge status: Home / Self Care | Payer: 59 | Source: Ambulatory Visit | Attending: Pulmonary Disease | Admitting: Pulmonary Disease

## 2021-04-19 ENCOUNTER — Other Ambulatory Visit: Payer: Self-pay

## 2021-04-19 DIAGNOSIS — U099 Post covid-19 condition, unspecified: Secondary | ICD-10-CM | POA: Diagnosis not present

## 2021-04-19 DIAGNOSIS — J1282 Pneumonia due to coronavirus disease 2019: Secondary | ICD-10-CM

## 2021-04-19 NOTE — Progress Notes (Signed)
Daily Session Note  Patient Details  Name: Clayton [name] MRN: [id number] Date of Birth: 1956/05/02 Referring Provider:   April Manson Pulmonary Rehab Walk Test from 03/30/2021 in [place]  Referring Provider Dr. Erin Fulling       Encounter Date: 04/19/2021  Check In:  Session Check In - 04/19/21 1125       Check-In   Supervising physician immediately available to respond to emergencies [facility] immediately available    Physician(s) Dr. Sarajane Jews    Location [facility]    Staff Present Rosebud Poles, RN, Quentin Ore, MS, ACSM-CEP, Exercise Physiologist    Virtual Visit No    Medication changes reported     No    Fall or balance concerns reported    No    Tobacco Cessation No Change    Warm-up and Cool-down Performed as group-led instruction    Resistance Training Performed Yes    VAD Patient? No    PAD/SET Patient? No      Pain Assessment   Currently in Pain? No/denies    Multiple Pain Sites No             Capillary Blood Glucose: No results found for this or any previous visit (from the past 24 hour(s)).    Social History   Tobacco Use  Smoking Status Never  Smokeless Tobacco Never    Goals Met:  Proper associated with RPD/PD & O2 Sat Exercise tolerated well No report of concerns or symptoms today Strength training completed today  Goals Unmet:  Not Applicable  Comments: Service time is from 1022 to 43    Dr. Fransico Him is Medical Director for Cardiac Rehab at [facility].

## 2021-04-23 NOTE — Progress Notes (Signed)
Pulmonary Individual Treatment Plan  Patient Details  Name: Clayton [name] MRN: [id number] Date of Birth: 13-Aug-1955 Referring Provider:   April Manson Pulmonary Rehab Walk Test from 03/30/2021 in [place]  Referring Provider Dr. Erin Fulling       Initial Encounter Date:  Flowsheet Row Pulmonary Rehab Walk Test from 03/30/2021 in [place]  Date 03/30/21       Visit Diagnosis: Pneumonia due to coronavirus disease 2019 (CODE)  Post covid-19 condition, unspecified  Patient's Home Medications on Admission:   Current Outpatient Medications:    acetaminophen (TYLENOL) 325 MG tablet, Take 2 tablets (650 mg total) by mouth every 6 (six) hours as needed for fever., Disp: , Rfl:    albuterol (VENTOLIN HFA) 108 (90 Base) MCG/ACT inhaler, Inhale 2 puffs into the lungs every 4 (four) hours as needed for wheezing or shortness of breath., Disp: 1 each, Rfl: 0   Apixaban (ELIQUIS PO), Take 1 tablet by mouth in the morning and at bedtime., Disp: , Rfl:    atorvastatin (LIPITOR) 40 MG tablet, Take 1 tablet by mouth daily., Disp: , Rfl:    clonazePAM (KLONOPIN) 0.5 MG tablet, Take 0.5 mg by mouth 3 (three) times daily as needed., Disp: , Rfl:    ipratropium-albuterol (DUONEB) 0.5-2.5 (3) MG/3ML SOLN, , Disp: , Rfl:    metFORMIN (GLUCOPHAGE-XR) 750 MG 24 hr tablet, metformin ER 750 mg tablet,extended release 24 hr, Disp: , Rfl:    montelukast (SINGULAIR) 10 MG tablet, Take 10 mg by mouth daily., Disp: , Rfl:    sertraline (ZOLOFT) 100 MG tablet, Take 100 mg by mouth daily., Disp: , Rfl:    sildenafil (REVATIO) 20 MG tablet, SMARTSIG:1-5 Tablet(s) By Mouth Daily PRN (Patient not taking: Reported on 03/30/2021), Disp: , Rfl:    sitaGLIPtin (JANUVIA) 100 MG tablet, Take 1 tablet by mouth daily., Disp: , Rfl:    sodium chloride (OCEAN) 0.65 % SOLN nasal spray, Place 1 spray into both nostrils as needed for congestion (nose irritation, give 1st  for congestion)., Disp: 30 mL, Rfl: 0   terbinafine (LAMISIL) 250 MG tablet, Take 250 mg by mouth daily. (Patient not taking: Reported on 03/30/2021), Disp: , Rfl:   Past Medical History: Past Medical History:  Diagnosis Date   Arthritis    Asthma    pulmonary allergies- no asthma per pt   Atrophic condition of skin    Cellulitis/abscess - trunk    Cyst    shoulder   Diabetes mellitus without complication (HCC)    Hyperlipidemia    Hypertension    Hypertrophic condition of skin    Lipoma    Obesity    Pneumonia    hx child   Sleep apnea    no cpap   Stones in the urinary tract    Vitamin D deficiency     Tobacco Use: Social History   Tobacco Use  Smoking Status Never  Smokeless Tobacco Never    Labs: Recent Review Flowsheet Data     Labs for ITP Cardiac and Pulmonary Rehab Latest Ref Rng & Units 04/24/2020 04/25/2020 05/05/2020 05/16/2020 05/31/2020   Cholestrol 0 - 200 mg/dL - 169 - - -   LDLCALC 0 - 99 mg/dL - 112(H) - - -   LDLDIRECT mg/dL - - - - -   HDL >40 mg/dL - 32(L) - - -   Trlycerides <150 mg/dL 163(H) 123 - - -   Hemoglobin A1c 4.8 - 5.6 % -  8.4(H) - - 8.6(H)   PHART 7.350 - 7.450 - - 7.440 7.455(H) -   PCO2ART 32.0 - 48.0 mmHg - - 45.7 50.5(H) -   HCO3 20.0 - 28.0 mmol/L - - 30.6(H) 35.1(H) -   O2SAT % - - 96.4 86.6 -       Capillary Blood Glucose: Lab Results  Component Value Date   GLUCAP 113 (H) 04/05/2021   GLUCAP 168 (H) 04/05/2021   GLUCAP 191 (H) 04/03/2021   GLUCAP 165 (H) 04/03/2021   GLUCAP 219 (H) 05/30/2020    POCT Glucose     Row Name 04/10/21 1140             POCT Blood Glucose   Pre-Exercise 168 mg/dL       Post-Exercise 113 mg/dL                Pulmonary Assessment Scores:  Pulmonary Assessment Scores     Row Name 03/30/21 1205         ADL UCSD   ADL Phase Entry     SOB Score total 54           CAT Score   CAT Score 22 - pre           mMRC Score   mMRC Score 4              UCSD: Self-administered rating of dyspnea associated with activities of daily living (ADLs) 6-point scale (0 = "not at all" to 5 = "maximal or unable to do because of breathlessness")  Scoring Scores range from 0 to 120.  Minimally important difference is 5 units  CAT: CAT can identify the health impairment of COPD patients and is better correlated with disease progression.  CAT has a scoring range of zero to 40. The CAT score is classified into four groups of low (less than 10), medium (10 - 20), high (21-30) and very high (31-40) based on the impact level of disease on health status. A CAT score over 10 suggests significant symptoms.  A worsening CAT score could be explained by an exacerbation, poor medication adherence, poor inhaler technique, or progression of COPD or comorbid conditions.  CAT MCID is 2 points  mMRC: mMRC (Modified Medical Research Council) Dyspnea Scale is used to assess the degree of baseline functional disability in patients of respiratory disease due to dyspnea. No minimal important difference is established. A decrease in score of 1 point or greater is considered a positive change.   Pulmonary Function Assessment:   Exercise Target Goals: Exercise Program Goal: Individual exercise prescription set using results from initial 6 min walk test and THRR while considering  patient's activity barriers and safety.   Exercise Prescription Goal: Initial exercise prescription builds to 30-45 minutes a day of aerobic activity, 2-3 days per week.  Home exercise guidelines will be given to patient during program as part of exercise prescription that the participant will acknowledge.  Activity Barriers & Risk Stratification:  Activity Barriers & Cardiac Risk Stratification - 03/30/21 1151       Activity Barriers & Cardiac Risk Stratification   Activity Barriers Back Problems;Deconditioning;Shortness of Breath;History of Falls;Balance Concerns    Cardiac Risk  Stratification Moderate             6 Minute Walk:  6 Minute Walk     Row Name 03/30/21 1328         6 Minute Walk   Phase Initial     Distance 1460  feet     Walk Time 6 minutes     # of Rest Breaks 0     MPH 2.77     METS 3.3     RPE 14     Perceived Dyspnea  1     VO2 Peak 11.54     Symptoms Yes (comment)     Comments Back pain 7/10     Resting HR 77 bpm     Resting BP 140/68     Resting Oxygen Saturation  95 %     Exercise Oxygen Saturation  during 6 min walk 90 %     Max Ex. HR 112 bpm     Max Ex. BP 150/70     2 Minute Post BP 142/70           Interval HR   1 Minute HR 102     2 Minute HR 105     3 Minute HR 112     4 Minute HR 104     5 Minute HR 107     6 Minute HR 111     2 Minute Post HR 74     Interval Heart Rate? Yes           Interval Oxygen   Interval Oxygen? Yes     Baseline Oxygen Saturation % 95 %     1 Minute Oxygen Saturation % 91 %     1 Minute Liters of Oxygen 0 L     2 Minute Oxygen Saturation % 91 %     2 Minute Liters of Oxygen 0 L     3 Minute Oxygen Saturation % 90 %     3 Minute Liters of Oxygen 0 L     4 Minute Oxygen Saturation % 91 %     4 Minute Liters of Oxygen 0 L     5 Minute Oxygen Saturation % 90 %     5 Minute Liters of Oxygen 0 L     6 Minute Oxygen Saturation % 91 %     6 Minute Liters of Oxygen 0 L     2 Minute Post Oxygen Saturation % 95 %     2 Minute Post Liters of Oxygen 74 L              Oxygen Initial Assessment:  Oxygen Initial Assessment - 04/12/21 1643       Home Oxygen   Home Oxygen Device Home Concentrator;Portable Concentrator;E-Tanks   [employer]   Sleep Oxygen Prescription CPAP;Continuous    Liters per minute 2    Home Exercise Oxygen Prescription None    Home Resting Oxygen Prescription Continuous    Liters per minute 2      Initial 6 min Walk   Oxygen Used None      Program Oxygen Prescription   Program Oxygen Prescription None      Intervention   Short Term Goals To  learn and exhibit compliance with exercise, home and travel O2 prescription;To learn and understand importance of monitoring SPO2 with pulse oximeter and demonstrate accurate use of the pulse oximeter.;To learn and understand importance of maintaining oxygen saturations>88%;To learn and demonstrate proper pursed lip breathing techniques or other breathing techniques. ;To learn and demonstrate proper use of respiratory medications    Long  Term Goals Exhibits compliance with exercise, home  and travel O2 prescription;Verbalizes importance of monitoring SPO2 with pulse oximeter and return demonstration;Maintenance of O2 saturations>88%;Exhibits proper breathing techniques,  such as pursed lip breathing or other method taught during program session;Compliance with respiratory medication;Demonstrates proper use of MDI's             Oxygen Re-Evaluation:  Oxygen Re-Evaluation     Row Name 04/12/21 1643             Goals/Expected Outcomes   Goals/Expected Outcomes Compliance and understanding of monitoring oxygen saturation and importance of pursed lip breathing                Oxygen Discharge (Final Oxygen Re-Evaluation):  Oxygen Re-Evaluation - 04/12/21 1643       Goals/Expected Outcomes   Goals/Expected Outcomes Compliance and understanding of monitoring oxygen saturation and importance of pursed lip breathing             Initial Exercise Prescription:  Initial Exercise Prescription - 03/30/21 1300       Date of Initial Exercise RX and Referring Provider   Date 03/30/21    Referring Provider Dr. Erin Fulling    Expected Discharge Date 05/31/21      NuStep   Level 2    SPM 80    Minutes 15      Track   Minutes 15      Prescription Details   Frequency (times per week) 2    Duration Progress to 30 minutes of continuous aerobic without signs/symptoms of physical distress      Intensity   THRR 40-80% of Max Heartrate 63-126    Ratings of Perceived Exertion 11-13     Perceived Dyspnea 0-4      Progression   Progression Continue to progress workloads to maintain intensity without signs/symptoms of physical distress.      Resistance Training   Training Prescription Yes    Weight Blue bands    Reps 10-15             Perform Capillary Blood Glucose checks as needed.  Exercise Prescription Changes:   Exercise Prescription Changes     Row Name 04/05/21 1140             Response to Exercise   Blood Pressure (Admit) 148/70       Blood Pressure (Exercise) 142/70       Blood Pressure (Exit) 130/64       Heart Rate (Admit) 88 bpm       Heart Rate (Exercise) 91 bpm       Heart Rate (Exit) 80 bpm       Oxygen Saturation (Admit) 95 %       Oxygen Saturation (Exercise) 95 %       Oxygen Saturation (Exit) 96 %       Rating of Perceived Exertion (Exercise) 12       Perceived Dyspnea (Exercise) 2       Duration Continue with 30 min of aerobic exercise without signs/symptoms of physical distress.       Intensity --  40-80% HRR               Resistance Training   Training Prescription Yes       Weight Blue Bands       Reps 10-15       Time 10 Minutes               NuStep   Level 2       SPM 80       Minutes 30       METs 1.7  did  not walk on track d/t nutrition consult                Exercise Comments:   Exercise Comments     Row Name 04/03/21 1215           Exercise Comments Pt completed first day of exercise. Pt tolerated exercise on the NuStep and track well. Pt averaged 2.2 METs on the NuStep and completed 5 laps. Pt updated nurse on health history and had to set up his phone before walking. Pt completed resistance band and cool down exercises without issues.                Exercise Goals and Review:   Exercise Goals     Row Name 03/30/21 1152 04/12/21 1645           Exercise Goals   Increase Physical Activity Yes Yes      Intervention Develop an individualized exercise prescription for aerobic and  resistive training based on initial evaluation findings, risk stratification, comorbidities and participant's personal goals.;Provide advice, education, support and counseling about physical activity/exercise needs. Develop an individualized exercise prescription for aerobic and resistive training based on initial evaluation findings, risk stratification, comorbidities and participant's personal goals.;Provide advice, education, support and counseling about physical activity/exercise needs.      Expected Outcomes Short Term: Attend rehab on a regular basis to increase amount of physical activity.;Long Term: Add in home exercise to make exercise part of routine and to increase amount of physical activity.;Long Term: Exercising regularly at least 3-5 days a week. Short Term: Attend rehab on a regular basis to increase amount of physical activity.;Long Term: Add in home exercise to make exercise part of routine and to increase amount of physical activity.;Long Term: Exercising regularly at least 3-5 days a week.      Increase Strength and Stamina Yes Yes      Intervention Provide advice, education, support and counseling about physical activity/exercise needs.;Develop an individualized exercise prescription for aerobic and resistive training based on initial evaluation findings, risk stratification, comorbidities and participant's personal goals. Provide advice, education, support and counseling about physical activity/exercise needs.;Develop an individualized exercise prescription for aerobic and resistive training based on initial evaluation findings, risk stratification, comorbidities and participant's personal goals.      Expected Outcomes Short Term: Increase workloads from initial exercise prescription for resistance, speed, and METs.;Short Term: Perform resistance training exercises routinely during rehab and add in resistance training at home;Long Term: Improve cardiorespiratory fitness, muscular endurance  and strength as measured by increased METs and functional capacity (6MWT) Short Term: Increase workloads from initial exercise prescription for resistance, speed, and METs.;Short Term: Perform resistance training exercises routinely during rehab and add in resistance training at home;Long Term: Improve cardiorespiratory fitness, muscular endurance and strength as measured by increased METs and functional capacity (6MWT)      Able to understand and use rate of perceived exertion (RPE) scale Yes Yes      Intervention Provide education and explanation on how to use RPE scale Provide education and explanation on how to use RPE scale      Expected Outcomes Short Term: Able to use RPE daily in rehab to express subjective intensity level;Long Term:  Able to use RPE to guide intensity level when exercising independently Short Term: Able to use RPE daily in rehab to express subjective intensity level;Long Term:  Able to use RPE to guide intensity level when exercising independently      Able to understand and  use Dyspnea scale Yes Yes      Intervention Provide education and explanation on how to use Dyspnea scale Provide education and explanation on how to use Dyspnea scale      Expected Outcomes Short Term: Able to use Dyspnea scale daily in rehab to express subjective sense of shortness of breath during exertion;Long Term: Able to use Dyspnea scale to guide intensity level when exercising independently Short Term: Able to use Dyspnea scale daily in rehab to express subjective sense of shortness of breath during exertion;Long Term: Able to use Dyspnea scale to guide intensity level when exercising independently      Knowledge and understanding of Target Heart Rate Range (THRR) Yes Yes      Intervention Provide education and explanation of THRR including how the numbers were predicted and where they are located for reference Provide education and explanation of THRR including how the numbers were predicted and where  they are located for reference      Expected Outcomes Short Term: Able to state/look up THRR;Short Term: Able to use daily as guideline for intensity in rehab;Long Term: Able to use THRR to govern intensity when exercising independently Short Term: Able to state/look up THRR;Short Term: Able to use daily as guideline for intensity in rehab;Long Term: Able to use THRR to govern intensity when exercising independently      Able to check pulse independently Yes Yes      Intervention Provide education and demonstration on how to check pulse in carotid and radial arteries.;Review the importance of being able to check your own pulse for safety during independent exercise Provide education and demonstration on how to check pulse in carotid and radial arteries.;Review the importance of being able to check your own pulse for safety during independent exercise      Expected Outcomes Short Term: Able to explain why pulse checking is important during independent exercise;Long Term: Able to check pulse independently and accurately Short Term: Able to explain why pulse checking is important during independent exercise;Long Term: Able to check pulse independently and accurately      Understanding of Exercise Prescription Yes Yes      Intervention Provide education, explanation, and written materials on patient's individual exercise prescription Provide education, explanation, and written materials on patient's individual exercise prescription      Expected Outcomes Short Term: Able to explain program exercise prescription;Long Term: Able to explain home exercise prescription to exercise independently Short Term: Able to explain program exercise prescription;Long Term: Able to explain home exercise prescription to exercise independently               Exercise Goals Re-Evaluation :  Exercise Goals Re-Evaluation     [place] Name 04/12/21 1645             Exercise Goal Re-Evaluation   Exercise Goals Review  Increase Physical Activity;Increase Strength and Stamina;Able to understand and use rate of perceived exertion (RPE) scale;Able to understand and use Dyspnea scale;Knowledge and understanding of Target Heart Rate Range (THRR);Understanding of Exercise Prescription       Comments Eugen has completed 2 exercises sessions. He has tolerated exercise fair on the Nustep and track. He averaged 2.2 METs at level 2 on the Nustep and 5 laps on the track. 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 I told him I would adjust his exercise prescription as necessary. He is motivated to exercise. He completes all of the warmup exercises  standing up and some of the cooldown exercises sitting down because of his chronic back pain. Will continue to monitor and progress as able.       Expected Outcomes Through exercise at rehab and home, the pt will decrease SOB with daily activities and feel confident in carrying out an exercise regimen at home.                Discharge Exercise Prescription (Final Exercise Prescription Changes):  Exercise Prescription Changes - 04/05/21 1140       Response to Exercise   Blood Pressure (Admit) 148/70    Blood Pressure (Exercise) 142/70    Blood Pressure (Exit) 130/64    Heart Rate (Admit) 88 bpm    Heart Rate (Exercise) 91 bpm    Heart Rate (Exit) 80 bpm    Oxygen Saturation (Admit) 95 %    Oxygen Saturation (Exercise) 95 %    Oxygen Saturation (Exit) 96 %    Rating of Perceived Exertion (Exercise) 12    Perceived Dyspnea (Exercise) 2    Duration Continue with 30 min of aerobic exercise without signs/symptoms of physical distress.    Intensity --   40-80% HRR     Resistance Training   Training Prescription Yes    Weight Blue Bands    Reps 10-15    Time 10 Minutes      NuStep   Level 2    SPM 80    Minutes 30    METs 1.7   did not walk on track d/t nutrition consult            Nutrition:  Target Goals:  Understanding of nutrition guidelines, daily intake of sodium '1500mg'$ , cholesterol '200mg'$ , calories 30% from fat and 7% or less from saturated fats, daily to have 5 or more servings of fruits and vegetables.  Biometrics:  Pre Biometrics - 03/30/21 1351       Pre Biometrics   Height $Remov'5\' 7"'VKnPSC$  (1.702 m)    Weight 106 kg    BMI (Calculated) 36.59    Grip Strength 20 kg              Nutrition Therapy Plan and Nutrition Goals:  Nutrition Therapy & Goals - 04/05/21 1205       Nutrition Therapy   Diet General Healthful      Personal Nutrition Goals   Nutrition Goal Pt to identify food quantities necessary to achieve weight loss of 6-24 lb at graduation from pulmonary rehab.    Personal Goal #2 Pt to build a healthy plate including vegetables, fruits, whole grains, and low-fat dairy products in a heart healthy meal plan.    Personal Goal #3 Pt to eat out/eat fast food less than 2 times per week      Intervention Plan   Intervention Prescribe, educate and counsel regarding individualized specific dietary modifications aiming towards targeted core components such as weight, hypertension, lipid management, diabetes, heart failure and other comorbidities.;Nutrition handout(s) given to patient.    Expected Outcomes Long Term Goal: Adherence to prescribed nutrition plan.;Short Term Goal: A plan has been developed with personal nutrition goals set during dietitian appointment.             Nutrition Assessments:  MEDIFICTS Score Key: ?70 Need to make dietary changes  40-70 Heart Healthy Diet ? 40 Therapeutic Level Cholesterol Diet   Picture Your Plate Scores: <09 Unhealthy dietary pattern with much room for improvement. 41-50 Dietary pattern unlikely to meet recommendations for good  health and room for improvement. 51-60 More healthful dietary pattern, with some room for improvement.  >60 Healthy dietary pattern, although there may be some specific behaviors that could be improved.     Nutrition Goals Re-Evaluation:  Nutrition Goals Re-Evaluation     [place] Name 04/05/21 1206 04/20/21 1412           Goals   Current Weight 233 lb (105.7 kg) 236 lb 8.9 oz (107.3 kg)      Nutrition Goal Pt to identify food quantities necessary to achieve weight loss of 6-24 lb at graduation from pulmonary rehab. Pt to identify food quantities necessary to achieve weight loss of 6-24 lb at graduation from pulmonary rehab.             Personal Goal #2 Re-Evaluation   Personal Goal #2 Pt to build a healthy plate including vegetables, fruits, whole grains, and low-fat dairy products in a heart healthy meal plan. Pt to build a healthy plate including vegetables, fruits, whole grains, and low-fat dairy products in a heart healthy meal plan.             Personal Goal #3 Re-Evaluation   Personal Goal #3 Pt to eat out/eat fast food less than 2 times per week Pt to eat out/eat fast food less than 2 times per week               Nutrition Goals Discharge (Final Nutrition Goals Re-Evaluation):  Nutrition Goals Re-Evaluation - 04/20/21 1412       Goals   Current Weight 236 lb 8.9 oz (107.3 kg)    Nutrition Goal Pt to identify food quantities necessary to achieve weight loss of 6-24 lb at graduation from pulmonary rehab.      Personal Goal #2 Re-Evaluation   Personal Goal #2 Pt to build a healthy plate including vegetables, fruits, whole grains, and low-fat dairy products in a heart healthy meal plan.      Personal Goal #3 Re-Evaluation   Personal Goal #3 Pt to eat out/eat fast food less than 2 times per week             Psychosocial: Target Goals: Acknowledge presence or absence of significant depression and/or stress, maximize coping skills, provide positive support system. Participant is able to verbalize types and ability to use techniques and skills needed for reducing stress and depression.  Initial Review & Psychosocial Screening:  Initial Psych Review & Screening -  03/30/21 1058       Initial Review   Current issues with Current Depression;Current Anxiety/Panic;Current Stress Concerns    Source of Stress Concerns Chronic Illness;Family;Unable to participate in former interests or hobbies;Unable to perform yard/household activities;Financial    Comments Tito at times feels overwhelmed by dependence of his family.  His wife does not work or help keep the house clean. His 70 year old mother who has memory issues lives with him and requires physical help.  Tito 75 year old son who has Autism and ADHD acts out often and struggles with normal adolecent issues. Although tito does acknowledge that if he needs something they will help but in the areas that they could help themselves they do not.      Family Dynamics   Good Support System? No    Strains Illness and family care strain;Intra-family strains    Concerns Inappropriate over/under dependence on family/friends      Barriers   Psychosocial barriers to participate in program The patient should benefit from training in  stress management and relaxation.      Screening Interventions   Interventions Encouraged to exercise;To provide support and resources with identified psychosocial needs    Expected Outcomes Long Term Goal: Stressors or current issues are controlled or eliminated.;Short Term goal: Identification and review with participant of any Quality of Life or Depression concerns found by scoring the questionnaire.;Long Term goal: The participant improves quality of Life and PHQ9 Scores as seen by post scores and/or verbalization of changes             Quality of Life Scores:  Scores of 19 and below usually indicate a poorer quality of life in these areas.  A difference of  2-3 points is a clinically meaningful difference.  A difference of 2-3 points in the total score of the Quality of Life Index has been associated with significant improvement in overall quality of life, self-image, physical  symptoms, and general health in studies assessing change in quality of life.  PHQ-9: Recent Review Flowsheet Data     Depression screen [facility] 2/9 03/30/2021 08/25/2015 08/16/2015 08/07/2015 08/04/2015   Decreased Interest 2 0 0 0 0   Down, Depressed, Hopeless 2 0 0 0 0   PHQ - 2 Score 4 0 0 0 0   Altered sleeping 3 - - - -   Tired, decreased energy 3 - - - -   Change in appetite 0 - - - -   Feeling bad or failure about yourself  0 - - - -   Trouble concentrating 2 - - - -   Moving slowly or fidgety/restless 0 - - - -   Suicidal thoughts 0 - - - -   PHQ-9 Score 12 - - - -   Difficult doing work/chores Very difficult - - - -      Interpretation of Total Score  Total Score Depression Severity:  1-4 = Minimal depression, 5-9 = Mild depression, 10-14 = Moderate depression, 15-19 = Moderately severe depression, 20-27 = Severe depression   Psychosocial Evaluation and Intervention:   Psychosocial Re-Evaluation:  Psychosocial Re-Evaluation     Row Name 04/16/21 1038             Psychosocial Re-Evaluation   Current issues with Current Depression;Current Anxiety/Panic;Current Stress Concerns       Comments He has attended 2 exercise sessions, no change in psychosocial concerns since orientation.  He does show anxiety when coming to class, he has chronic back pain which makes exercise uncomfortable for him.  He has stressors at home, his 60 y/o mother and autistic son who live with him and his wife.       Expected Outcomes For Tito to learn methods of relaxation and to handle stress in healthy ways.       Interventions Encouraged to attend Pulmonary Rehabilitation for the exercise;Relaxation education;Stress management education       Continue Psychosocial Services  Follow up required by staff               Initial Review   Source of Stress Concerns Chronic Illness;Unable to participate in former interests or hobbies;Family;Unable to perform yard/household activities                 Psychosocial Discharge (Final Psychosocial Re-Evaluation):  Psychosocial Re-Evaluation - 04/16/21 1038       Psychosocial Re-Evaluation   Current issues with Current Depression;Current Anxiety/Panic;Current Stress Concerns    Comments He has attended 2 exercise sessions, no change in psychosocial concerns since  orientation.  He does show anxiety when coming to class, he has chronic back pain which makes exercise uncomfortable for him.  He has stressors at home, his 39 y/o mother and autistic son who live with him and his wife.    Expected Outcomes For Tito to learn methods of relaxation and to handle stress in healthy ways.    Interventions Encouraged to attend Pulmonary Rehabilitation for the exercise;Relaxation education;Stress management education    Continue Psychosocial Services  Follow up required by staff      Initial Review   Source of Stress Concerns Chronic Illness;Unable to participate in former interests or hobbies;Family;Unable to perform yard/household activities             Education: Education Goals: Education classes will be provided on a weekly basis, covering required topics. Participant will state understanding/return demonstration of topics presented.  Learning Barriers/Preferences:  Learning Barriers/Preferences - 03/30/21 1119       Learning Barriers/Preferences   Learning Barriers Sight;Hearing;Exercise Concerns    Learning Preferences Audio;Computer/Internet;Group Instruction;Individual Instruction;Pictoral;Skilled Demonstration;Verbal Instruction;Video;Written Material             Education Topics: Risk Factor Reduction:  -Group instruction that is supported by a PowerPoint presentation. Instructor discusses the definition of a risk factor, different risk factors for pulmonary disease, and how the heart and lungs work together.   Flowsheet Row PULMONARY REHAB CHRONIC OBSTRUCTIVE PULMONARY DISEASE from 04/19/2021 in [place]  Date 04/12/21  Educator handout       Nutrition for Pulmonary Patient:  -Group instruction provided by PowerPoint slides, verbal discussion, and written materials to support subject matter. The instructor gives an explanation and review of healthy diet recommendations, which includes a discussion on weight management, recommendations for fruit and vegetable consumption, as well as protein, fluid, caffeine, fiber, sodium, sugar, and alcohol. Tips for eating when patients are short of breath are discussed. Flowsheet Row PULMONARY REHAB CHRONIC OBSTRUCTIVE PULMONARY DISEASE from 04/19/2021 in [place]  Date 04/19/21  Educator handout       Pursed Lip Breathing:  -Group instruction that is supported by demonstration and informational handouts. Instructor discusses the benefits of pursed lip and diaphragmatic breathing and detailed demonstration on how to preform both.     Oxygen Safety:  -Group instruction provided by PowerPoint, verbal discussion, and written material to support subject matter. There is an overview of "What is Oxygen" and "Why do we need it".  Instructor also reviews how to create a safe environment for oxygen use, the importance of using oxygen as prescribed, and the risks of noncompliance. There is a brief discussion on traveling with oxygen and resources the patient may utilize.   Oxygen Equipment:  -Group instruction provided by [facility] Staff utilizing handouts, written materials, and equipment demonstrations.   Signs and Symptoms:  -Group instruction provided by written material and verbal discussion to support subject matter. Warning signs and symptoms of infection, stroke, and heart attack are reviewed and when to call the physician/911 reinforced. Tips for preventing the spread of infection discussed.   Advanced Directives:  -Group instruction provided by verbal instruction and written material to support  subject matter. Instructor reviews Advanced Directive laws and proper instruction for filling out document.   Pulmonary Video:  -Group video education that reviews the importance of medication and oxygen compliance, exercise, good nutrition, pulmonary hygiene, and pursed lip and diaphragmatic breathing for the pulmonary patient.   Exercise for the Pulmonary Patient:  -  Group instruction that is supported by a PowerPoint presentation. Instructor discusses benefits of exercise, core components of exercise, frequency, duration, and intensity of an exercise routine, importance of utilizing pulse oximetry during exercise, safety while exercising, and options of places to exercise outside of rehab.     Pulmonary Medications:  -Verbally interactive group education provided by instructor with focus on inhaled medications and proper administration.   Anatomy and Physiology of the Respiratory System and Intimacy:  -Group instruction provided by PowerPoint, verbal discussion, and written material to support subject matter. Instructor reviews respiratory cycle and anatomical components of the respiratory system and their functions. Instructor also reviews differences in obstructive and restrictive respiratory diseases with examples of each. Intimacy, Sex, and Sexuality differences are reviewed with a discussion on how relationships can change when diagnosed with pulmonary disease. Common sexual concerns are reviewed.   MD DAY -A group question and answer session with a medical doctor that allows participants to ask questions that relate to their pulmonary disease state.   OTHER EDUCATION -Group or individual verbal, written, or video instructions that support the educational goals of the pulmonary rehab program. [place] from 04/19/2021 in [place]  Date 04/05/21  Educator handout  [Beat a Sedentary Lifestyle]        Holiday Eating Survival Tips:  -Group instruction provided by PowerPoint slides, verbal discussion, and written materials to support subject matter. The instructor gives patients tips, tricks, and techniques to help them not only survive but enjoy the holidays despite the onslaught of food that accompanies the holidays.   Knowledge Questionnaire Score:  Knowledge Questionnaire Score - 03/30/21 1313       Knowledge Questionnaire Score   Pre Score 18/18             Core Components/Risk Factors/Patient Goals at Admission:  Personal Goals and Risk Factors at Admission - 03/30/21 1400       Core Components/Risk Factors/Patient Goals on Admission    Weight Management Obesity    Intervention Weight Management: Develop a combined nutrition and exercise program designed to reach desired caloric intake, while maintaining appropriate intake of nutrient and fiber, sodium and fats, and appropriate energy expenditure required for the weight goal.;Weight Management: Provide education and appropriate resources to help participant work on and attain dietary goals.;Weight Management/Obesity: Establish reasonable short term and long term weight goals.;Obesity: Provide education and appropriate resources to help participant work on and attain dietary goals.    Admit Weight 233 lb 11 oz (106 kg)             Core Components/Risk Factors/Patient Goals Review:   Goals and Risk Factor Review     Row Name 04/16/21 1041             Core Components/Risk Factors/Patient Goals Review   Personal Goals Review Develop more efficient breathing techniques such as purse lipped breathing and diaphragmatic breathing and practicing self-pacing with activity.;Increase knowledge of respiratory medications and ability to use respiratory devices properly.;Improve shortness of breath with ADL's       Review Lane Hacker has attended 2 exercise sessions, too early to have improved shortness of breath and stamina.        Expected Outcomes See admission goals.                Core Components/Risk Factors/Patient Goals at Discharge (Final Review):   Goals and Risk Factor Review - 04/16/21 1041  Core Components/Risk Factors/Patient Goals Review   Personal Goals Review Develop more efficient breathing techniques such as purse lipped breathing and diaphragmatic breathing and practicing self-pacing with activity.;Increase knowledge of respiratory medications and ability to use respiratory devices properly.;Improve shortness of breath with ADL's    Review Lane Hacker has attended 2 exercise sessions, too early to have improved shortness of breath and stamina.    Expected Outcomes See admission goals.             ITP Comments:   Comments: ITP REVIEW Pt is making expected progress toward pulmonary rehab goals after completing 4 sessions. Recommend continued exercise, life style modification, education, and utilization of breathing techniques to increase stamina and strength and decrease shortness of breath with exertion.

## 2021-04-24 ENCOUNTER — Other Ambulatory Visit: Payer: Self-pay

## 2021-04-24 ENCOUNTER — Encounter (HOSPITAL_COMMUNITY)
Admission: RE | Admit: 2021-04-24 | Discharge: 2021-04-24 | Disposition: A | Discharge status: Home / Self Care | Payer: 59 | Source: Ambulatory Visit | Attending: Pulmonary Disease | Admitting: Pulmonary Disease

## 2021-04-24 VITALS — Wt 238.3 lb

## 2021-04-24 DIAGNOSIS — J1282 Pneumonia due to coronavirus disease 2019: Secondary | ICD-10-CM

## 2021-04-24 DIAGNOSIS — U099 Post covid-19 condition, unspecified: Secondary | ICD-10-CM

## 2021-04-24 NOTE — Progress Notes (Signed)
Daily Session Note  Patient Details  Name: Clayton [name] MRN: [id number] Date of Birth: 06-15-1956 Referring Provider:   April Manson Pulmonary Rehab Walk Test from 03/30/2021 in [place]  Referring Provider Dr. Erin Fulling       Encounter Date: 04/24/2021  Check In:  Session Check In - 04/24/21 1144       Check-In   Supervising physician immediately available to respond to emergencies [facility] immediately available    Physician(s) Dr. Sarajane Jews    Location [facility]    Staff Present Rosebud Poles, RN, Quentin Ore, MS, ACSM-CEP, Exercise Physiologist;Lisa Ysidro Evert, RN    Virtual Visit No    Medication changes reported     No    Fall or balance concerns reported    No    Tobacco Cessation No Change    Warm-up and Cool-down Performed as group-led instruction    Resistance Training Performed Yes    VAD Patient? No    PAD/SET Patient? No      Pain Assessment   Currently in Pain? Yes    Pain Score 8     Pain Location Back    Pain Type Chronic pain    Multiple Pain Sites No             Capillary Blood Glucose: No results found for this or any previous visit (from the past 24 hour(s)).   Exercise Prescription Changes - 04/24/21 1200       Response to Exercise   Blood Pressure (Admit) 118/80    Blood Pressure (Exercise) 128/80    Blood Pressure (Exit) 144/80    Heart Rate (Admit) 79 bpm    Heart Rate (Exercise) 111 bpm    Heart Rate (Exit) 75 bpm    Oxygen Saturation (Admit) 96 %    Oxygen Saturation (Exercise) 93 %    Oxygen Saturation (Exit) 96 %    Rating of Perceived Exertion (Exercise) 12    Perceived Dyspnea (Exercise) 2    Duration Continue with 30 min of aerobic exercise without signs/symptoms of physical distress.    Intensity THRR unchanged      Resistance Training   Training Prescription Yes    Weight Blue Bands    Reps 10-15    Time 10 Minutes      NuStep   Level 3    SPM 80     Minutes 15    METs 2.4      Track   Laps 16    Minutes 15             Social History   Tobacco Use  Smoking Status Never  Smokeless Tobacco Never    Goals Met:  Proper associated with RPD/PD & O2 Sat Independence with exercise equipment Improved SOB with ADL's Exercise tolerated well No report of concerns or symptoms today Strength training completed today  Goals Unmet:  Not Applicable  Comments: Service time is from 1018 to 1128.    Dr. Fransico Him is Medical Director for Cardiac Rehab at [facility].

## 2021-04-26 ENCOUNTER — Encounter (HOSPITAL_COMMUNITY)
Admission: RE | Admit: 2021-04-26 | Discharge: 2021-04-26 | Disposition: A | Discharge status: Home / Self Care | Payer: 59 | Source: Ambulatory Visit | Attending: Pulmonary Disease | Admitting: Pulmonary Disease

## 2021-04-26 ENCOUNTER — Other Ambulatory Visit: Payer: Self-pay

## 2021-04-26 DIAGNOSIS — U099 Post covid-19 condition, unspecified: Secondary | ICD-10-CM | POA: Diagnosis not present

## 2021-04-26 DIAGNOSIS — J1282 Pneumonia due to coronavirus disease 2019: Secondary | ICD-10-CM

## 2021-04-26 NOTE — Progress Notes (Signed)
Daily Session Note  Patient Details  Name: Clayton [name] MRN: [id number] Date of Birth: 05-28-1956 Referring Provider:   April Manson Pulmonary Rehab Walk Test from 03/30/2021 in [place]  Referring Provider Dr. Erin Fulling       Encounter Date: 04/26/2021  Check In:  Session Check In - 04/26/21 1114       Check-In   Supervising physician immediately available to respond to emergencies [facility] immediately available    Physician(s) DrFlorene Glen    Location [facility]    Staff Present Rosebud Poles, RN, Quentin Ore, MS, ACSM-CEP, Exercise Physiologist;Lisa Ysidro Evert, RN    Virtual Visit No    Medication changes reported     No    Fall or balance concerns reported    No    Tobacco Cessation No Change    Warm-up and Cool-down Performed as group-led instruction    Resistance Training Performed Yes    VAD Patient? No    PAD/SET Patient? No      Pain Assessment   Currently in Pain? No/denies    Multiple Pain Sites No             Capillary Blood Glucose: No results found for this or any previous visit (from the past 24 hour(s)).    Social History   Tobacco Use  Smoking Status Never  Smokeless Tobacco Never    Goals Met:  Proper associated with RPD/PD & O2 Sat Independence with exercise equipment Exercise tolerated well No report of concerns or symptoms today Strength training completed today  Goals Unmet:  Not Applicable  Comments: Service time is from 1015 to 1135.    Dr. Fransico Him is Medical Director for Cardiac Rehab at [facility].

## 2021-04-27 ENCOUNTER — Ambulatory Visit (INDEPENDENT_AMBULATORY_CARE_PROVIDER_SITE_OTHER)
Admission: RE | Admit: 2021-04-27 | Discharge: 2021-04-27 | Disposition: A | Discharge status: Home / Self Care | Payer: 59 | Source: Ambulatory Visit | Attending: Pulmonary Disease | Admitting: Pulmonary Disease

## 2021-04-27 DIAGNOSIS — J984 Other disorders of lung: Secondary | ICD-10-CM | POA: Diagnosis not present

## 2021-04-27 DIAGNOSIS — R0602 Shortness of breath: Secondary | ICD-10-CM

## 2021-04-30 ENCOUNTER — Encounter (HOSPITAL_BASED_OUTPATIENT_CLINIC_OR_DEPARTMENT_OTHER): Payer: Self-pay | Admitting: Physical Therapy

## 2021-04-30 ENCOUNTER — Other Ambulatory Visit: Payer: Self-pay

## 2021-04-30 ENCOUNTER — Ambulatory Visit (HOSPITAL_BASED_OUTPATIENT_CLINIC_OR_DEPARTMENT_OTHER): Payer: 59 | Attending: Orthopedic Surgery | Admitting: Physical Therapy

## 2021-04-30 DIAGNOSIS — R252 Cramp and spasm: Secondary | ICD-10-CM | POA: Insufficient documentation

## 2021-04-30 DIAGNOSIS — G8929 Other chronic pain: Secondary | ICD-10-CM | POA: Diagnosis present

## 2021-04-30 DIAGNOSIS — R2689 Other abnormalities of gait and mobility: Secondary | ICD-10-CM | POA: Insufficient documentation

## 2021-04-30 DIAGNOSIS — M545 Low back pain, unspecified: Secondary | ICD-10-CM | POA: Diagnosis present

## 2021-04-30 NOTE — Therapy (Signed)
OUTPATIENT PHYSICAL THERAPY THORACOLUMBAR EVALUATION   Patient Name: Clayton [name] MRN: [id number] DOB:03-20-1956, 65 y.o., male Today's Date: 05/01/2021   PT End of Session - 05/01/21 1342     Visit Number 1    Number of Visits 12    Date for PT Re-Evaluation 06/12/21    Authorization Type [employer]    PT Start Time 0930    PT Stop Time 1012    PT Time Calculation (min) 42 min    Activity Tolerance Patient tolerated treatment well    Behavior During Therapy WFL for tasks assessed/performed             Past Medical History:  Diagnosis Date   Arthritis    Asthma    pulmonary allergies- no asthma per pt   Atrophic condition of skin    Cellulitis/abscess - trunk    Cyst    shoulder   Diabetes mellitus without complication ([place])    Hyperlipidemia    Hypertension    Hypertrophic condition of skin    Lipoma    Obesity    Pneumonia    hx child   Sleep apnea    no cpap   Stones in the urinary tract    Vitamin D deficiency    Past Surgical History:  Procedure Laterality Date   cyst removed from [place] N/A 05/23/2015   Procedure: INSERTION OF MESH;  Surgeon: [name], MD;  Location: [place];  Service: General;  Laterality: N/A;   MASS EXCISION  07/15/11   left shoulder mass    NASAL SINUS SURGERY---UVULA REMOVED  2004   Dr. Wilburn Cornelia   PROSTATE BIOPSY  9/16   [place] N/A 05/23/2015   Procedure: LAPAROSCOPIC UMBILICAL HERNIA REPAIR ;  Surgeon: [name], MD;  Location: [place];  Service: General;  Laterality: N/A;   Patient Active Problem List   Diagnosis Date Noted   OSA (obstructive sleep apnea) 01/15/2021   Chronic respiratory failure with hypoxia ([place]) 01/15/2021   Shortness of breath 01/15/2021   Physical deconditioning 01/15/2021   Malnutrition of moderate degree 05/18/2020   Uncontrolled type 2 diabetes mellitus with hyperglycemia ([place]) 04/25/2020   Acute hypoxemic respiratory  failure due to COVID-19 ([place]) 04/24/2020   Hypokalemia 04/24/2020   Hyperglycemia 04/24/2020   Acute respiratory failure with hypoxia ([place])    Suspected COVID-19 virus infection    S/P hernia repair 05/23/2015   Chronic cough 06/25/2011   LIPOMA, left shoulder. 04/04/2008   ALLERGIC RHINITIS 12/21/2007   GERD 12/21/2007   NEPHROLITHIASIS, HX OF 12/21/2007    PCP: Kristen Loader, FNP  REFERRING PROVIDER: Melina Schools, MD  REFERRING DIAG: M54.50 (ICD-10-CM) - Low back pain, unspecified   THERAPY DIAG:  Chronic bilateral low back pain without sciatica - Plan: PT plan of care cert/re-cert  Cramp and spasm - Plan: PT plan of care cert/re-cert  Other abnormalities of gait and mobility - Plan: PT plan of care cert/re-cert Low Back Pain   ONSET DATE: Since he was 24   SUBJECTIVE:  SUBJECTIVE STATEMENT: Patient has a long history of lower back pain. He started having low back pain around 65 years old. He was found to have a L5 spondylolisthesis. He had injections, but they have stopped working. He had his last injection a few months ago. He has been told he can not have surgery 2nd to an enlarged heart and recent pulmonary issues temming from Covid 19.  PERTINENT HISTORY:  Lipoma of t-spine and sacral area; Arthritis  general;   PAIN:  Are you having pain? Yes VAS scale:  8/10 Pain location: Low Back  Pain orientation: Right and Left  L> R  PAIN TYPE: sharp Pain description: constant  Aggravating factors: reaching overhead; needs to lean forward when doing tasks like shopping Relieving factors: lying on the left side   PRECAUTIONS: None  WEIGHT BEARING RESTRICTIONS No  FALLS:  Has patient fallen in last 6 months? No, Number of falls:   LIVING ENVIRONMENT: Lives with: lives with their  family Lives in: House/apartment Stairs: Yes; External: 10 steps; Rail on   going up Internal 14 steps into the house  Has following equipment at home: None  OCCUPATION: on disability   PLOF: Independent  PATIENT GOALS :  to have less pain    OBJECTIVE:   DIAGNOSTIC FINDINGS:  Has had MRI of the lower; Just had a recent CT of the lungs   PATIENT SURVEYS:    SCREENING FOR RED FLAGS: Bowel or bladder incontinence: No urinary urgency  Spinal tumors: No Had a lipoma but not directly on the spine  Cauda equina syndrome: No Compression fracture: No Abdominal aneurysm: No  COGNITION:  Overall cognitive status: Within functional limits for tasks assessed     SENSATION:  Light touch: Appears intact  Stereognosis: Appears intact  Hot/Cold: Appears intact  Proprioception: Appears intact   POSTURE:  Good posture   LUMBARAROM/PROM  A/PROM A/PROM  05/01/2021  Flexion 60 with mild pain   Extension Not assessed   Right lateral flexion   Left lateral flexion   Right rotation Painful   Left rotation Painful    (Blank rows = not tested)  LE AROM/PROM:  PROM Right 05/01/2021 Left 05/01/2021  Hip flexion No pain  Mild pain   Hip extension    Hip abduction    Hip adduction    Hip internal rotation No pain  Mild pain   Hip external rotation    Knee flexion    Knee extension    Ankle dorsiflexion    Ankle plantarflexion    Ankle inversion    Ankle eversion     (Blank rows = not tested)  LE MMT:  MMT Right 05/01/2021 Left 05/01/2021  Hip flexion 4/5 4/5   Hip extension    Hip abduction 4+/5 4+/5  Hip adduction 5/5 5/5  Hip internal rotation    Hip external rotation    Knee flexion    Knee extension    Ankle dorsiflexion    Ankle plantarflexion    Ankle inversion    Ankle eversion     (Blank rows = not tested)  LUMBAR SPECIAL TESTS:    SPINAL SEGMENTAL MOBILITY ASSESSMENT:  Not assessed 2nd spondy and inability to lie prone    GAIT: Distance walked:    Comments: significant lateral movement to both side; decreased bilateral hip flexion     TODAY'S TREATMENT                    Exercises Supine Piriformis Stretch  with Foot on Ground - 1 x daily - 7 x weekly - 3 sets - 3 reps - 20 hold Seated Hamstring Stretch - 1 x daily - 7 x weekly - 3 sets - 3 reps - 20 hold Standing Glute Med Mobilization with Small Ball on Wall - 1 x daily - 7 x weekly - 1 sets - 1 reps - 2 min hold    PATIENT EDUCATION:  Education details: reviewed anatomy of condition, potential triggers and how therapy can help  Person educated: Patient Education method: Explanation demonstration , handout  Education comprehension: verbalized understanding, returned demonstration, verbal cues required, tactile cues required, and needs further education   HOME EXERCISE PROGRAM: Access Code: [email or web address] URL: [email or web address] Date: 05/01/2021 Prepared by: Carolyne Littles  Exercises Supine Piriformis Stretch with Foot on Ground - 1 x daily - 7 x weekly - 3 sets - 3 reps - 20 hold Seated Hamstring Stretch - 1 x daily - 7 x weekly - 3 sets - 3 reps - 20 hold Standing Glute Med Mobilization with Small Ball on Wall - 1 x daily - 7 x weekly - 1 sets - 1 reps - 2 min hold   ASSESSMENT:  CLINICAL IMPRESSION: Patient is a 65 y.o. male who was seen today for physical therapy evaluation and treatment for lower back pain.  Per patient his MRI revealed an L5 spondylolisthesis He presents with pain with extension; end range flexion and rotation. He has weakness in his hip flexors and abductors on the left side. He has lateral movement with gait. He has significant muscle spasming in his lumbar spine. He would benefit form skilled therapy to improve his ability to stand and walk in the community.   Abnormal gait, decreased activity tolerance, decreased endurance, decreased mobility, difficulty walking, decreased ROM, decreased strength, decreased safety awareness,  increased fascial restrictions, impaired perceived functional ability, impaired UE functional use, obesity, and pain  cleaning, driving, occupation, yard work, and shopping  Past/current experiences and 1-2 comorbidities: general arthritis; lipomas around the spien but no on the spine   REHAB POTENTIAL: Good  CLINICAL DECISION MAKING: Evolving/moderate complexity decreasing ability to stand and walk   EVALUATION COMPLEXITY: Moderate   GOALS: Goals reviewed with patient? Yes  SHORT TERM GOALS:  STG Name Target Date Goal status  1 Patient will perform full lumbar rotation without pain  Baseline:  05/22/2021 INITIAL  2 Patient will increase bilateral hip strength to 4+/5  Baseline:  05/22/2021 INITIAL  3 Patient will be independent with basic HEP  Baseline: 05/22/2021 INITIAL  LONG TERM GOALS:   LTG Name Target Date Goal status  1 Patient will stand for 1 hour without increased pain  Baseline: 06/12/2021 INITIAL  2 Patient will walk around the grocery store without increased pain  Baseline: 06/12/2021 INITIAL  3 Patient will be independent with a complete HEP in the pool and on land  Baseline: 06/12/2021 INITIAL  PT FREQUENCY: 2x/week  PT DURATION: 6 weeks  PLANNED INTERVENTIONS: Therapeutic exercises, Therapeutic activity, Neuro Muscular re-education, Balance training, Gait training, Patient/Family education, Joint mobilization, Stair training, Aquatic Therapy, Dry Needling, Electrical stimulation, Cryotherapy, Moist heat, Taping, Traction, Ultrasound, and Manual therapy  PLAN FOR NEXT SESSION: If in the pool work on standing exercises as neutral as possible; hip strengthening and core strengthening; on land consider TPDN and manual trigger point release; Patient did not tolerate supine well. Consider seated UE or LE core strengthening exercises    Carney Living PT DPT  05/01/2021,  4:55 PM

## 2021-05-01 ENCOUNTER — Encounter (HOSPITAL_COMMUNITY)
Admission: RE | Admit: 2021-05-01 | Discharge: 2021-05-01 | Disposition: A | Discharge status: Home / Self Care | Payer: 59 | Source: Ambulatory Visit | Attending: Pulmonary Disease | Admitting: Pulmonary Disease

## 2021-05-01 DIAGNOSIS — J1282 Pneumonia due to coronavirus disease 2019: Secondary | ICD-10-CM

## 2021-05-01 DIAGNOSIS — U099 Post covid-19 condition, unspecified: Secondary | ICD-10-CM

## 2021-05-01 NOTE — Progress Notes (Signed)
Daily Session Note  Patient Details  Name: Clayton [name] MRN: [id number] Date of Birth: 04-20-1956 Referring Provider:   April Manson Pulmonary Rehab Walk Test from 03/30/2021 in [place]  Referring Provider Dr. Erin Fulling       Encounter Date: 05/01/2021  Check In:  Session Check In - 05/01/21 1122       Check-In   Supervising physician immediately available to respond to emergencies [facility] immediately available    Physician(s) Dr. Florene Glen    Location [facility]    Staff Present Rosebud Poles, RN, Quentin Ore, MS, ACSM-CEP, Exercise Physiologist;Medrith Veillon Ysidro Evert, RN    Virtual Visit No    Medication changes reported     No    Fall or balance concerns reported    No    Tobacco Cessation No Change    Warm-up and Cool-down Performed as group-led instruction    Resistance Training Performed No    VAD Patient? No    PAD/SET Patient? No      Pain Assessment   Currently in Pain? No/denies    Multiple Pain Sites No             Capillary Blood Glucose: No results found for this or any previous visit (from the past 24 hour(s)).    Social History   Tobacco Use  Smoking Status Never  Smokeless Tobacco Never    Goals Met:  Exercise tolerated well No report of concerns or symptoms today Strength training completed today  Goals Unmet:  Not Applicable  Comments: Service time is from 1020 to 1135    Dr. Fransico Him is Medical Director for Cardiac Rehab at [facility].

## 2021-05-03 ENCOUNTER — Encounter (HOSPITAL_COMMUNITY)
Admission: RE | Admit: 2021-05-03 | Discharge: 2021-05-03 | Disposition: A | Discharge status: Home / Self Care | Payer: 59 | Source: Ambulatory Visit | Attending: Pulmonary Disease | Admitting: Pulmonary Disease

## 2021-05-03 ENCOUNTER — Other Ambulatory Visit: Payer: Self-pay

## 2021-05-03 DIAGNOSIS — J1282 Pneumonia due to coronavirus disease 2019: Secondary | ICD-10-CM

## 2021-05-03 DIAGNOSIS — U099 Post covid-19 condition, unspecified: Secondary | ICD-10-CM

## 2021-05-03 NOTE — Progress Notes (Signed)
Daily Session Note  Patient Details  Name: Clayton [name] MRN: [id number] Date of Birth: 1955-12-19 Referring Provider:   April Manson Pulmonary Rehab Walk Test from 03/30/2021 in [place]  Referring Provider Dr. Erin Fulling       Encounter Date: 05/03/2021  Check In:  Session Check In - 05/03/21 1121       Check-In   Supervising physician immediately available to respond to emergencies [facility] immediately available    Physician(s) Dr. Silvio Clayman    Location [facility]    Staff Present Rosebud Poles, RN, Quentin Ore, MS, ACSM-CEP, Exercise Physiologist;Lisa Ysidro Evert, RN    Virtual Visit No    Medication changes reported     No    Fall or balance concerns reported    No    Tobacco Cessation No Change    Warm-up and Cool-down Performed as group-led instruction    Resistance Training Performed Yes    VAD Patient? No    PAD/SET Patient? No      Pain Assessment   Currently in Pain? No/denies             Capillary Blood Glucose: No results found for this or any previous visit (from the past 24 hour(s)).    Social History   Tobacco Use  Smoking Status Never  Smokeless Tobacco Never    Goals Met:  Proper associated with RPD/PD & O2 Sat Independence with exercise equipment Exercise tolerated well Strength training completed today  Goals Unmet:  Not Applicable  Comments: Service time is from 1025 to 1147.    Dr. Fransico Him is Medical Director for Cardiac Rehab at [facility].

## 2021-05-04 ENCOUNTER — Ambulatory Visit (HOSPITAL_BASED_OUTPATIENT_CLINIC_OR_DEPARTMENT_OTHER): Payer: 59 | Admitting: Physical Therapy

## 2021-05-04 DIAGNOSIS — R252 Cramp and spasm: Secondary | ICD-10-CM

## 2021-05-04 DIAGNOSIS — R2689 Other abnormalities of gait and mobility: Secondary | ICD-10-CM

## 2021-05-04 DIAGNOSIS — M545 Low back pain, unspecified: Secondary | ICD-10-CM | POA: Diagnosis not present

## 2021-05-04 DIAGNOSIS — G8929 Other chronic pain: Secondary | ICD-10-CM

## 2021-05-06 ENCOUNTER — Encounter (HOSPITAL_BASED_OUTPATIENT_CLINIC_OR_DEPARTMENT_OTHER): Payer: Self-pay | Admitting: Physical Therapy

## 2021-05-06 NOTE — Therapy (Signed)
OUTPATIENT PHYSICAL THERAPY TREATMENT NOTE   Patient Name: Clayton [name] MRN: [id number] DOB:1955-12-05, 65 y.o., male Today's Date: 05/06/2021  PCP: Kristen Loader, FNP REFERRING PROVIDER: Kristen Loader, FNP   PT End of Session - 05/06/21 1630     Visit Number 2    Number of Visits 12    Date for PT Re-Evaluation 06/12/21    Authorization Type [employer]    PT Start Time 0845    PT Stop Time 0927    PT Time Calculation (min) 42 min    Activity Tolerance Patient tolerated treatment well    Behavior During Therapy WFL for tasks assessed/performed             Past Medical History:  Diagnosis Date   Arthritis    Asthma    pulmonary allergies- no asthma per pt   Atrophic condition of skin    Cellulitis/abscess - trunk    Cyst    shoulder   Diabetes mellitus without complication ([place])    Hyperlipidemia    Hypertension    Hypertrophic condition of skin    Lipoma    Obesity    Pneumonia    hx child   Sleep apnea    no cpap   Stones in the urinary tract    Vitamin D deficiency    Past Surgical History:  Procedure Laterality Date   cyst removed from [place] N/A 05/23/2015   Procedure: INSERTION OF MESH;  Surgeon: [name], MD;  Location: [place];  Service: General;  Laterality: N/A;   MASS EXCISION  07/15/11   left shoulder mass    NASAL SINUS SURGERY---UVULA REMOVED  2004   Dr. Wilburn Cornelia   PROSTATE BIOPSY  9/16   [place] N/A 05/23/2015   Procedure: LAPAROSCOPIC UMBILICAL HERNIA REPAIR ;  Surgeon: [name], MD;  Location: [place];  Service: General;  Laterality: N/A;   Patient Active Problem List   Diagnosis Date Noted   OSA (obstructive sleep apnea) 01/15/2021   Chronic respiratory failure with hypoxia ([place]) 01/15/2021   Shortness of breath 01/15/2021   Physical deconditioning 01/15/2021   Malnutrition of moderate degree 05/18/2020   Uncontrolled type 2 diabetes mellitus with  hyperglycemia ([place]) 04/25/2020   Acute hypoxemic respiratory failure due to COVID-19 ([place]) 04/24/2020   Hypokalemia 04/24/2020   Hyperglycemia 04/24/2020   Acute respiratory failure with hypoxia ([place])    Suspected COVID-19 virus infection    S/P hernia repair 05/23/2015   Chronic cough 06/25/2011   LIPOMA, left shoulder. 04/04/2008   ALLERGIC RHINITIS 12/21/2007   GERD 12/21/2007   NEPHROLITHIASIS, HX OF 12/21/2007    REFERRING DIAG: M54.50 (ICD-10-CM) - Low back pain, unspecified    THERAPY DIAG:  Chronic bilateral low back pain without sciatica - Plan: PT plan of care cert/re-cert   Cramp and spasm - Plan: PT plan of care cert/re-cert    PERTINENT HISTORY:  Lipoma of t-spine and sacral area; Arthritis  general PRECAUTIONS: None   SUBJECTIVE: Patient feels like he may have done too much this week. He has owrked out in the gym several days and has some low back pain.   PAIN:  Are you having pain? Yes VAS scale: 5/10 Pain location: Low Back  Pain orientation: Bilateral  PAIN TYPE: aching Pain description: intermittent  Aggravating factors: standing and walking  Relieving factors: rest     OBJECTIVE:   Today's treatment :  Pt seen for aquatic therapy today.  Treatment took place in water 3.25-4 ft in depth at the [facility] pool. Temp of water was 91.  Pt entered/exited the pool via stairs (step through pattern) independently with bilat rail.  Introduction to water. Had patient stand at different levels so she could feel the bouncy   Warm up: heel/toe walking x4 laps across pool chest deep side stepping x4 laps from shallow to deep;  Ambulation with noodle support: long strides, march cuing to use yellow noodle to stay supported.  Exercises; Slow march x20; squats x20; hip extension x20; hip abduction x20; Sit to stand x20;    board trunk flexion x10 lateral board rotation x10 each way seated   With neck noddle and foot float; push pull x20; side to side  perturbations from the feet x20; press and push x20  Steps step up x20 each leg; lateral step up x20 each leg;   Seated LAQ and March x20 2lbs    Hamstring stretch 3x20 sec hold  Piriformis stretch 3x20 sec hold     Pt requires buoyancy for support and to offload joints with strengthening exercises. Viscosity of the water is needed for resistance of strengthening; water current perturbations provides challenge to standing balance unsupported, requiring increased core activation    REHAB POTENTIAL: Good   CLINICAL DECISION MAKING: Evolving/moderate complexity decreasing ability to stand and walk    EVALUATION COMPLEXITY: Moderate  PATIENT EDUCATION:  Education details: reviewed HEP  Person educated: Patient Education method: Explanation Education comprehension: verbalized understanding      CLINICAL IMPRESSION:  Patient tolerated pool treatment well. He was sore coming I. Therapy focused on manual therapy to decrease pain. Therapy also worked on walking with a good gait pattern. He was able to walk with less lateral movement in the pool. Therapy will continue to progress as tolerated.    Abnormal gait, decreased activity tolerance, decreased endurance, decreased mobility, difficulty walking, decreased ROM, decreased strength, decreased safety awareness, increased fascial restrictions, impaired perceived functional ability, impaired UE functional use, obesity, and pain   cleaning, driving, occupation, yard work, and shopping   Past/current experiences and 1-2 comorbidities: general arthritis; lipomas around the spien but no on the spine    REHAB POTENTIAL: Good   CLINICAL DECISION MAKING: Evolving/moderate complexity decreasing ability to stand and walk    EVALUATION COMPLEXITY: Moderate     GOALS: Goals reviewed with patient? Yes   SHORT TERM GOALS:   STG Name Target Date Goal status  1 Patient will perform full lumbar rotation without pain  Baseline:  05/22/2021  INITIAL  2 Patient will increase bilateral hip strength to 4+/5  Baseline:  05/22/2021 INITIAL  3 Patient will be independent with basic HEP  Baseline: 05/22/2021 INITIAL  LONG TERM GOALS:    LTG Name Target Date Goal status  1 Patient will stand for 1 hour without increased pain  Baseline: 06/12/2021 INITIAL  2 Patient will walk around the grocery store without increased pain  Baseline: 06/12/2021 INITIAL  3 Patient will be independent with a complete HEP in the pool and on land  Baseline: 06/12/2021 INITIAL  PT FREQUENCY: 2x/week   PT DURATION: 6 weeks   PLANNED INTERVENTIONS: Therapeutic exercises, Therapeutic activity, Neuro Muscular re-education, Balance training, Gait training, Patient/Family education, Joint mobilization, Stair training, Aquatic Therapy, Dry Needling, Electrical stimulation, Cryotherapy, Moist heat, Taping, Traction, Ultrasound, and Manual therapy   PLAN FOR NEXT SESSION: If in the pool work on standing exercises as neutral  as possible; hip strengthening and core strengthening; on land consider TPDN and manual trigger point release; Patient did not tolerate supine well. Consider seated UE or LE core strengthening exercises      Carney Living PT DPT  05/01/2021, 4:55 PM     Carney Living PT DPT  05/06/2021, 4:31 PM    [place]  Patient name: Clayton [name] MRN: [id number] DOB: 1956/06/29

## 2021-05-07 ENCOUNTER — Other Ambulatory Visit: Payer: Self-pay

## 2021-05-07 ENCOUNTER — Ambulatory Visit (HOSPITAL_BASED_OUTPATIENT_CLINIC_OR_DEPARTMENT_OTHER): Payer: 59 | Attending: Orthopedic Surgery | Admitting: Physical Therapy

## 2021-05-07 ENCOUNTER — Encounter (HOSPITAL_BASED_OUTPATIENT_CLINIC_OR_DEPARTMENT_OTHER): Payer: Self-pay | Admitting: Physical Therapy

## 2021-05-07 DIAGNOSIS — R2689 Other abnormalities of gait and mobility: Secondary | ICD-10-CM | POA: Insufficient documentation

## 2021-05-07 DIAGNOSIS — R252 Cramp and spasm: Secondary | ICD-10-CM | POA: Diagnosis present

## 2021-05-07 DIAGNOSIS — G8929 Other chronic pain: Secondary | ICD-10-CM | POA: Diagnosis present

## 2021-05-07 DIAGNOSIS — M545 Low back pain, unspecified: Secondary | ICD-10-CM | POA: Diagnosis not present

## 2021-05-07 NOTE — Therapy (Signed)
OUTPATIENT PHYSICAL THERAPY TREATMENT NOTE   Patient Name: [name] MRN: [id number] DOB:July 15, 1956, 65 y.o., male Today's Date: 05/08/2021  PCP: Kristen Loader, FNP REFERRING PROVIDER: Kristen Loader, FNP   PT End of Session - 05/08/21 0813     Visit Number 3    Number of Visits 12    Date for PT Re-Evaluation 06/12/21    Authorization Type [employer]    PT Start Time 0930    PT Stop Time 1011    PT Time Calculation (min) 41 min    Activity Tolerance Patient tolerated treatment well    Behavior During Therapy WFL for tasks assessed/performed             Past Medical History:  Diagnosis Date   Arthritis    Asthma    pulmonary allergies- no asthma per pt   Atrophic condition of skin    Cellulitis/abscess - trunk    Cyst    shoulder   Diabetes mellitus without complication ([place])    Hyperlipidemia    Hypertension    Hypertrophic condition of skin    Lipoma    Obesity    Pneumonia    hx child   Sleep apnea    no cpap   Stones in the urinary tract    Vitamin D deficiency    Past Surgical History:  Procedure Laterality Date   cyst removed from [place] N/A 05/23/2015   Procedure: INSERTION OF MESH;  Surgeon: [name], MD;  Location: [facility];  Service: General;  Laterality: N/A;   MASS EXCISION  07/15/11   left shoulder mass    NASAL SINUS SURGERY---UVULA REMOVED  2004   Dr. Wilburn Cornelia   PROSTATE BIOPSY  9/16   [place] N/A 05/23/2015   Procedure: LAPAROSCOPIC UMBILICAL HERNIA REPAIR ;  Surgeon: [name], MD;  Location: [facility];  Service: General;  Laterality: N/A;   Patient Active Problem List   Diagnosis Date Noted   OSA (obstructive sleep apnea) 01/15/2021   Chronic respiratory failure with hypoxia ([place]) 01/15/2021   Shortness of breath 01/15/2021   Physical deconditioning 01/15/2021   Malnutrition of moderate degree 05/18/2020   Uncontrolled type 2 diabetes mellitus with  hyperglycemia ([place]) 04/25/2020   Acute hypoxemic respiratory failure due to COVID-19 ([place]) 04/24/2020   Hypokalemia 04/24/2020   Hyperglycemia 04/24/2020   Acute respiratory failure with hypoxia ([place])    Suspected COVID-19 virus infection    S/P hernia repair 05/23/2015   Chronic cough 06/25/2011   LIPOMA, left shoulder. 04/04/2008   ALLERGIC RHINITIS 12/21/2007   GERD 12/21/2007   NEPHROLITHIASIS, HX OF 12/21/2007      REFERRING DIAG: M54.50 (ICD-10-CM) - Low back pain, unspecified    THERAPY DIAG:  Chronic bilateral low back pain without sciatica - Plan: PT plan of care cert/re-cert   Cramp and spasm - Plan: PT plan of care cert/re-cert    PERTINENT HISTORY:  Lipoma of t-spine and sacral area; Arthritis  general PRECAUTIONS: None    SUBJECTIVE: Patient reports he is stiff this morning. He just woke up that way.  He feels like his back is about the same    PAIN:  Are you having pain? Yes VAS scale: 8/10 Pain location: Low Back  Pain orientation: Bilateral  PAIN TYPE: aching Pain description: intermittent  Aggravating factors: standing and walking  Relieving factors: rest        OBJECTIVE:  Today's treatment :     10/3  [place] Therapy: trigger point release to bilateral lower lumbar paraspinals;  LAD to left LE with grade III and Iv occilations   TPDN: 2 spots in left and right paraspinals and 1 spot in the left gluteal. Patient had only a mild twitch response.   Exercises: bilateral row 3x10 10lbs shoulder extension 1x10 15 lbs Patient had pain so exercise stopped. Switched to LE exercises seated clamshell red 3x10; LAQ 3x10 each leg    REHAB POTENTIAL: Good   CLINICAL DECISION MAKING: Evolving/moderate complexity decreasing ability to stand and walk    EVALUATION COMPLEXITY: Moderate   PATIENT EDUCATION:  Education details: reviewed HEP  Person educated: Patient Education method: Explanation Education comprehension: verbalized understanding        CLINICAL IMPRESSION:   Patient did not have much of a twitch with needling. Therapy needled his gluteals and his lower back. He had increased pain with light weight shoulder extension. He was advised, if a particular exercise hurts, not to do it. He tolerated seated exercises well. He is doing a lot right now. He is doing pulmonary rehab, physical therapy, and working out at the gym. He is also doing manual work for work at times. We may have to talk about activity modification and rest for recovery.     PT impairments: Abnormal gait, decreased activity tolerance, decreased endurance, decreased mobility, difficulty walking, decreased ROM, decreased strength, decreased safety awareness, increased fascial restrictions, impaired perceived functional ability, impaired UE functional use, obesity, and pain   Activity limitations: cleaning, driving, occupation, yard work, and shopping   Other factors: Past/current experiences and 1-2 comorbidities: general arthritis; lipomas around the spien but no on the spine    REHAB POTENTIAL: Good   CLINICAL DECISION MAKING: Evolving/moderate complexity decreasing ability to stand and walk    EVALUATION COMPLEXITY: Moderate     GOALS: Goals reviewed with patient? Yes   SHORT TERM GOALS:   STG Name Target Date Goal status  1 Patient will perform full lumbar rotation without pain  Baseline:  05/22/2021 INITIAL  2 Patient will increase bilateral hip strength to 4+/5  Baseline:  05/22/2021 INITIAL  3 Patient will be independent with basic HEP  Baseline: 05/22/2021 INITIAL  LONG TERM GOALS:    LTG Name Target Date Goal status  1 Patient will stand for 1 hour without increased pain  Baseline: 06/12/2021 INITIAL  2 Patient will walk around the grocery store without increased pain  Baseline: 06/12/2021 INITIAL  3 Patient will be independent with a complete HEP in the pool and on land  Baseline: 06/12/2021 INITIAL  PT FREQUENCY: 2x/week   PT DURATION: 6  weeks   PLANNED INTERVENTIONS: Therapeutic exercises, Therapeutic activity, Neuro Muscular re-education, Balance training, Gait training, Patient/Family education, Joint mobilization, Stair training, Aquatic Therapy, Dry Needling, Electrical stimulation, Cryotherapy, Moist heat, Taping, Traction, Ultrasound, and Manual therapy   PLAN FOR NEXT SESSION: If in the pool work on standing exercises as neutral as possible; hip strengthening and core strengthening; on land consider TPDN and manual trigger point release; Patient did not tolerate supine well. Consider seated UE or LE core strengthening exercises      Carney Living PT DPT  05/01/2021, 4:55 PM          PATIENT EDUCATION:  Education details: symptom management with exercises  Person educated: Patient Education method: Explanation, Demonstration, Tactile cues, and Verbal cues Education comprehension: verbalized understanding    Carney Living PT DPT  05/08/2021, 8:16 AM    [facility] [address] [place], [place], [address] Phone: [phone]   Fax:  [phone]  Patient name: Dontea Corlew MRN: [id number] DOB: [birth year]

## 2021-05-08 ENCOUNTER — Encounter (HOSPITAL_COMMUNITY)
Admission: RE | Admit: 2021-05-08 | Discharge: 2021-05-08 | Disposition: A | Discharge status: Home / Self Care | Payer: 59 | Source: Ambulatory Visit | Attending: Pulmonary Disease | Admitting: Pulmonary Disease

## 2021-05-08 ENCOUNTER — Encounter (HOSPITAL_BASED_OUTPATIENT_CLINIC_OR_DEPARTMENT_OTHER): Payer: Self-pay | Admitting: Physical Therapy

## 2021-05-08 VITALS — Wt 238.1 lb

## 2021-05-08 DIAGNOSIS — R252 Cramp and spasm: Secondary | ICD-10-CM | POA: Diagnosis not present

## 2021-05-08 DIAGNOSIS — G8929 Other chronic pain: Secondary | ICD-10-CM | POA: Diagnosis not present

## 2021-05-08 DIAGNOSIS — J1282 Pneumonia due to coronavirus disease 2019: Secondary | ICD-10-CM

## 2021-05-08 DIAGNOSIS — R2689 Other abnormalities of gait and mobility: Secondary | ICD-10-CM | POA: Insufficient documentation

## 2021-05-08 DIAGNOSIS — M545 Low back pain, unspecified: Secondary | ICD-10-CM | POA: Insufficient documentation

## 2021-05-08 DIAGNOSIS — U099 Post covid-19 condition, unspecified: Secondary | ICD-10-CM

## 2021-05-08 NOTE — Progress Notes (Signed)
Daily Session Note  Patient Details  Name: Clayton [name] MRN: [id number] Date of Birth: 1955-11-21 Referring Provider:   April Manson Pulmonary Rehab Walk Test from 03/30/2021 in [place]  Referring Provider Dr. Erin Fulling       Encounter Date: 05/08/2021  Check In:  Session Check In - 05/08/21 1118       Check-In   Supervising physician immediately available to respond to emergencies [facility] immediately available    Physician(s) Dr. Gerlean Ren    Location [facility]    Staff Present Rosebud Poles, RN, Milus Glazier, MS, ACSM-CEP, CCRP, Exercise Physiologist;Kaylee Rosana Hoes, MS, ACSM-CEP, Exercise Physiologist;Lisa Ysidro Evert, RN    Virtual Visit No    Medication changes reported     No    Fall or balance concerns reported    No    Tobacco Cessation No Change    Warm-up and Cool-down Performed as group-led instruction    Resistance Training Performed Yes    VAD Patient? No    PAD/SET Patient? No      Pain Assessment   Currently in Pain? No/denies    Multiple Pain Sites No             Capillary Blood Glucose: No results found for this or any previous visit (from the past 24 hour(s)).   Exercise Prescription Changes - 05/08/21 1200       Response to Exercise   Blood Pressure (Admit) 124/70    Blood Pressure (Exercise) 118/60    Blood Pressure (Exit) 124/64    Heart Rate (Admit) 91 bpm    Heart Rate (Exercise) 104 bpm    Heart Rate (Exit) 82 bpm    Oxygen Saturation (Admit) 95 %    Oxygen Saturation (Exercise) 94 %    Oxygen Saturation (Exit) 96 %    Rating of Perceived Exertion (Exercise) 12.5    Perceived Dyspnea (Exercise) 2    Duration Continue with 30 min of aerobic exercise without signs/symptoms of physical distress.    Intensity THRR unchanged      Resistance Training   Training Prescription Yes    Weight Blue Bands    Reps 10-15    Time 10 Minutes      NuStep   Level 4    SPM 80     Minutes 15    METs 3      Track   Laps 8    Minutes 15             Social History   Tobacco Use  Smoking Status Never  Smokeless Tobacco Never    Goals Met:  Proper associated with RPD/PD & O2 Sat Improved SOB with ADL's Exercise tolerated well No report of concerns or symptoms today Strength training completed today  Goals Unmet:  Not Applicable  Comments: Service time is from 1023 to 1140    Dr. Fransico Him is Medical Director for Cardiac Rehab at [facility].

## 2021-05-10 ENCOUNTER — Other Ambulatory Visit: Payer: Self-pay

## 2021-05-10 ENCOUNTER — Encounter (HOSPITAL_COMMUNITY)
Admission: RE | Admit: 2021-05-10 | Discharge: 2021-05-10 | Disposition: A | Discharge status: Home / Self Care | Payer: 59 | Source: Ambulatory Visit | Attending: Pulmonary Disease | Admitting: Pulmonary Disease

## 2021-05-10 DIAGNOSIS — J1282 Pneumonia due to coronavirus disease 2019: Secondary | ICD-10-CM

## 2021-05-10 DIAGNOSIS — U099 Post covid-19 condition, unspecified: Secondary | ICD-10-CM

## 2021-05-10 DIAGNOSIS — M545 Low back pain, unspecified: Secondary | ICD-10-CM | POA: Diagnosis not present

## 2021-05-10 NOTE — Progress Notes (Signed)
Daily Session Note  Patient Details  Name: Clayton [name] MRN: [id number] Date of Birth: 05-03-1956 Referring Provider:   April Manson Pulmonary Rehab Walk Test from 03/30/2021 in [place]  Referring Provider Dr. Erin Fulling       Encounter Date: 05/10/2021  Check In:  Session Check In - 05/10/21 1117       Check-In   Supervising physician immediately available to respond to emergencies [facility] immediately available    Physician(s) Dr. Nevada Crane    Location [facility]    Staff Present Rosebud Poles, RN, Quentin Ore, MS, ACSM-CEP, Exercise Physiologist;Lisa Ysidro Evert, RN    Virtual Visit No    Medication changes reported     No    Fall or balance concerns reported    No    Tobacco Cessation No Change    Warm-up and Cool-down Performed as group-led instruction    Resistance Training Performed Yes    VAD Patient? No    PAD/SET Patient? No      Pain Assessment   Currently in Pain? Yes    Pain Score 9     Pain Location Back    Pain Orientation Lower    Pain Descriptors / Indicators Constant    Pain Type Chronic pain    Pain Onset More than a month ago    Pain Frequency Constant    Multiple Pain Sites No             Capillary Blood Glucose: No results found for this or any previous visit (from the past 24 hour(s)).    Social History   Tobacco Use  Smoking Status Never  Smokeless Tobacco Never    Goals Met:  Proper associated with RPD/PD & O2 Sat Independence with exercise equipment Exercise tolerated well Strength training completed today  Goals Unmet:  Not Applicable  Comments: Service time is from 1015 to 1140.    Dr. Fransico Him is Medical Director for Cardiac Rehab at [facility]

## 2021-05-11 ENCOUNTER — Encounter (HOSPITAL_BASED_OUTPATIENT_CLINIC_OR_DEPARTMENT_OTHER): Payer: Self-pay | Admitting: Physical Therapy

## 2021-05-11 ENCOUNTER — Ambulatory Visit (HOSPITAL_BASED_OUTPATIENT_CLINIC_OR_DEPARTMENT_OTHER): Payer: 59 | Admitting: Physical Therapy

## 2021-05-11 DIAGNOSIS — R2689 Other abnormalities of gait and mobility: Secondary | ICD-10-CM

## 2021-05-11 DIAGNOSIS — R252 Cramp and spasm: Secondary | ICD-10-CM

## 2021-05-11 DIAGNOSIS — M545 Low back pain, unspecified: Secondary | ICD-10-CM

## 2021-05-11 DIAGNOSIS — G8929 Other chronic pain: Secondary | ICD-10-CM

## 2021-05-11 NOTE — Therapy (Signed)
OUTPATIENT PHYSICAL THERAPY TREATMENT NOTE   Patient Name: Clayton [name] MRN: [id number] DOB:1956-01-22, 65 y.o., male Today's Date: 05/11/2021  PCP: Kristen Loader, FNP REFERRING PROVIDER: Kristen Loader, FNP    Past Medical History:  Diagnosis Date   Arthritis    Asthma    pulmonary allergies- no asthma per pt   Atrophic condition of skin    Cellulitis/abscess - trunk    Cyst    shoulder   Diabetes mellitus without complication ([place])    Hyperlipidemia    Hypertension    Hypertrophic condition of skin    Lipoma    Obesity    Pneumonia    hx child   Sleep apnea    no cpap   Stones in the urinary tract    Vitamin D deficiency    Past Surgical History:  Procedure Laterality Date   cyst removed from [place] N/A 05/23/2015   Procedure: INSERTION OF MESH;  Surgeon: [name], MD;  Location: [facility];  Service: General;  Laterality: N/A;   MASS EXCISION  07/15/11   left shoulder mass    NASAL SINUS SURGERY---UVULA REMOVED  2004   Dr. Wilburn Cornelia   PROSTATE BIOPSY  9/16   [place] N/A 05/23/2015   Procedure: [place] ;  Surgeon: [name], MD;  Location: [facility];  Service: General;  Laterality: N/A;   Patient Active Problem List   Diagnosis Date Noted   OSA (obstructive sleep apnea) 01/15/2021   Chronic respiratory failure with hypoxia ([place]) 01/15/2021   Shortness of breath 01/15/2021   Physical deconditioning 01/15/2021   Malnutrition of moderate degree 05/18/2020   Uncontrolled type 2 diabetes mellitus with hyperglycemia ([place]) 04/25/2020   Acute hypoxemic respiratory failure due to COVID-19 ([place]) 04/24/2020   Hypokalemia 04/24/2020   Hyperglycemia 04/24/2020   Acute respiratory failure with hypoxia ([place])    Suspected COVID-19 virus infection    S/P hernia repair 05/23/2015   Chronic cough 06/25/2011   LIPOMA, left shoulder. 04/04/2008   ALLERGIC RHINITIS 12/21/2007    GERD 12/21/2007   NEPHROLITHIASIS, HX OF 12/21/2007   PCP: Kristen Loader, FNP REFERRING PROVIDER: Dr Melina Schools    REFERRING DIAG: M54.50 (ICD-10-CM) - Low back pain, unspecified    THERAPY DIAG:  Chronic bilateral low back pain without sciatica - Plan: PT plan of care cert/re-cert   Cramp and spasm - Plan: PT plan of care cert/re-cert    PERTINENT HISTORY:  Lipoma of t-spine and sacral area; Arthritis  general PRECAUTIONS: None    SUBJECTIVE: Patient feels like the gym exercises have caused him significant pain. He would like to continue with just the pool for now. He was sore for a few days. This morning his back started hurting about mid morning.   PAIN:  Are you having pain? Yes VAS scale: 6/10 Pain location: Low back Pain orientation: Left  PAIN TYPE: aching Pain description: intermittent  Aggravating factors: exercises last visit. Morning and nigh t Relieving factors: Rest  Today's treatment :   09/30 Pt seen for aquatic therapy today.  Treatment took place in water 3.25-4 ft in depth at the [facility] pool. Temp of water was 91.  Pt entered/exited the pool via stairs (step through pattern) independently with bilat rail.   Introduction to water. Had patient stand at different levels so she could feel the bouncy    Warm up: heel/toe walking x4 laps  across pool chest deep side stepping x4 laps from shallow to deep;  Ambulation with noodle support: long strides, march cuing to use yellow noodle to stay supported.  Exercises; Slow march x20; squats x20; hip extension x20; hip abduction x20; Sit to stand x20;     board trunk flexion x10 lateral board rotation x10 each way seated    With neck noddle and foot float; push pull x20; side to side perturbations from the feet x20; press and push x20   Steps step up x20 each leg; lateral step up x20 each leg;    Seated LAQ and March x20 2lbs      Hamstring stretch 3x20 sec hold  Piriformis stretch 3x20  sec hold        Pt requires buoyancy for support and to offload joints with strengthening exercises. Viscosity of the water is needed for resistance of strengthening; water current perturbations provides challenge to standing balance unsupported, requiring increased core activation    10/7 Pt seen for aquatic therapy today.  Treatment took place in water 3.25-4 ft in depth at the [facility] pool. Temp of water was 91.  Pt entered/exited the pool via stairs (step through pattern) independently with bilat rail.   Introduction to water. Had patient stand at different levels so she could feel the bouncy    Warm up: heel/toe walking x4 laps across pool chest deep side stepping x4 laps from shallow to deep;  Ambulation with noodle support: long strides, march cuing to use yellow noodle to stay supported.   Exercises; Slow march x20; squats x20; hip extension x20; hip abduction x20; Sit to stand x20;     board trunk flexion x10 lateral board rotation x10 each way seated    Seated LAQ and March x20 2lbs    Hamstring stretch 3x20 sec hold  Piriformis stretch 3x20 sec hold    Standing yellow weight push pull with glute activation x20      Pt requires buoyancy for support and to offload joints with strengthening exercises. Viscosity of the water is needed for resistance of strengthening; water current perturbations provides challenge to standing balance unsupported, requiring increased core activation    REHAB POTENTIAL: Good   CLINICAL DECISION MAKING: Evolving/moderate complexity decreasing ability to stand and walk    EVALUATION COMPLEXITY: Moderate   PATIENT EDUCATION:  Education details: reviewed HEP  Person educated: Patient Education method: Explanation Education comprehension: verbalized understanding       CLINICAL IMPRESSION: Therapy focused more on movements to stretch today. He reported pain with push pull exercise. He otherwise tolerated exercises well. He felt  better performing exercises in the pool. We will keep him in the pool for now.    Abnormal gait, decreased activity tolerance, decreased endurance, decreased mobility, difficulty walking, decreased ROM, decreased strength, decreased safety awareness, increased fascial restrictions, impaired perceived functional ability, impaired UE functional use, obesity, and pain   cleaning, driving, occupation, yard work, and shopping   Past/current experiences and 1-2 comorbidities: general arthritis; lipomas around the spien but no on the spine    REHAB POTENTIAL: Good   CLINICAL DECISION MAKING: Evolving/moderate complexity decreasing ability to stand and walk    EVALUATION COMPLEXITY: Moderate     GOALS: Goals reviewed with patient? Yes   SHORT TERM GOALS:   STG Name Target Date Goal status  1 Patient will perform full lumbar rotation without pain  Baseline:  05/22/2021 INITIAL  2 Patient will increase bilateral hip strength to 4+/5  Baseline:  05/22/2021 INITIAL  3 Patient will be independent with basic HEP  Baseline: 05/22/2021 INITIAL  LONG TERM GOALS:    LTG Name Target Date Goal status  1 Patient will stand for 1 hour without increased pain  Baseline: 06/12/2021 INITIAL  2 Patient will walk around the grocery store without increased pain  Baseline: 06/12/2021 INITIAL  3 Patient will be independent with a complete HEP in the pool and on land  Baseline: 06/12/2021 INITIAL  PT FREQUENCY: 2x/week   PT DURATION: 6 weeks   PLANNED INTERVENTIONS: Therapeutic exercises, Therapeutic activity, Neuro Muscular re-education, Balance training, Gait training, Patient/Family education, Joint mobilization, Stair training, Aquatic Therapy, Dry Needling, Electrical stimulation, Cryotherapy, Moist heat, Taping, Traction, Ultrasound, and Manual therapy   PLAN FOR NEXT SESSION: If in the pool work on standing exercises as neutral as possible; hip strengthening and core strengthening; on land consider  TPDN and manual trigger point release; Patient did not tolerate supine well. Consider seated UE or LE core strengthening exercises          Carney Living PT DPT  05/11/2021, 1:39 PM

## 2021-05-14 ENCOUNTER — Ambulatory Visit (HOSPITAL_BASED_OUTPATIENT_CLINIC_OR_DEPARTMENT_OTHER): Payer: 59 | Admitting: Physical Therapy

## 2021-05-15 ENCOUNTER — Other Ambulatory Visit: Payer: Self-pay

## 2021-05-15 ENCOUNTER — Encounter (HOSPITAL_COMMUNITY)
Admission: RE | Admit: 2021-05-15 | Discharge: 2021-05-15 | Disposition: A | Discharge status: Home / Self Care | Payer: 59 | Source: Ambulatory Visit | Attending: Pulmonary Disease | Admitting: Pulmonary Disease

## 2021-05-15 VITALS — Wt 240.1 lb

## 2021-05-15 DIAGNOSIS — M545 Low back pain, unspecified: Secondary | ICD-10-CM | POA: Diagnosis not present

## 2021-05-15 DIAGNOSIS — U099 Post covid-19 condition, unspecified: Secondary | ICD-10-CM

## 2021-05-15 DIAGNOSIS — J1282 Pneumonia due to coronavirus disease 2019: Secondary | ICD-10-CM

## 2021-05-15 NOTE — Progress Notes (Signed)
Daily Session Note  Patient Details  Name: Clayton [name] MRN: [id number] Date of Birth: 05-02-1956 Referring Provider:   April Manson Pulmonary Rehab Walk Test from 03/30/2021 in [place]  Referring Provider Dr. Erin Fulling       Encounter Date: 05/15/2021  Check In:  Session Check In - 05/15/21 1117       Check-In   Supervising physician immediately available to respond to emergencies [facility] immediately available    Physician(s) Dr. Nevada Crane    Location [facility]    Staff Present Rosebud Poles, RN, Quentin Ore, MS, ACSM-CEP, Exercise Physiologist;Denzil Mceachron Oree Serene, MS, ACSM CEP, Exercise Physiologist    Virtual Visit No    Medication changes reported     No    Fall or balance concerns reported    No    Tobacco Cessation No Change    Warm-up and Cool-down Performed as group-led instruction    Resistance Training Performed Yes    VAD Patient? No    PAD/SET Patient? No      Pain Assessment   Currently in Pain? Yes    Pain Score 7     Pain Location Back    Pain Orientation Lower    Pain Descriptors / Indicators Constant    Pain Type Chronic pain    Pain Onset More than a month ago    Pain Frequency Constant    Multiple Pain Sites No             Capillary Blood Glucose: No results found for this or any previous visit (from the past 24 hour(s)).    Social History   Tobacco Use  Smoking Status Never  Smokeless Tobacco Never    Goals Met:  Exercise tolerated well No report of concerns or symptoms today Strength training completed today  Goals Unmet:  Not Applicable  Comments: Service time is from 1028 to 1140    Dr. Fransico Him is Medical Director for Cardiac Rehab at [facility].

## 2021-05-17 ENCOUNTER — Telehealth (HOSPITAL_COMMUNITY): Payer: Self-pay | Admitting: Family Medicine

## 2021-05-17 ENCOUNTER — Encounter (HOSPITAL_COMMUNITY): Payer: 59

## 2021-05-18 ENCOUNTER — Encounter (HOSPITAL_BASED_OUTPATIENT_CLINIC_OR_DEPARTMENT_OTHER): Payer: Self-pay | Admitting: Physical Therapy

## 2021-05-18 ENCOUNTER — Other Ambulatory Visit: Payer: Self-pay

## 2021-05-18 ENCOUNTER — Ambulatory Visit (HOSPITAL_BASED_OUTPATIENT_CLINIC_OR_DEPARTMENT_OTHER): Payer: 59 | Admitting: Physical Therapy

## 2021-05-18 DIAGNOSIS — M545 Low back pain, unspecified: Secondary | ICD-10-CM

## 2021-05-18 DIAGNOSIS — R2689 Other abnormalities of gait and mobility: Secondary | ICD-10-CM

## 2021-05-18 DIAGNOSIS — G8929 Other chronic pain: Secondary | ICD-10-CM

## 2021-05-18 DIAGNOSIS — R252 Cramp and spasm: Secondary | ICD-10-CM

## 2021-05-18 NOTE — Therapy (Signed)
OUTPATIENT PHYSICAL THERAPY TREATMENT NOTE   Patient Name: Clayton [name] MRN: [id number] DOB:May 23, 1956, 65 y.o., male Today's Date: 05/18/2021  PCP: Kristen Loader, FNP REFERRING PROVIDER: Kristen Loader, FNP   PT End of Session - 05/18/21 1549     Visit Number 5    Number of Visits 12    Date for PT Re-Evaluation 06/12/21    Authorization Type [employer]    PT Start Time 0930    PT Stop Time 1010    PT Time Calculation (min) 40 min    Activity Tolerance Patient tolerated treatment well    Behavior During Therapy WFL for tasks assessed/performed             Past Medical History:  Diagnosis Date   Arthritis    Asthma    pulmonary allergies- no asthma per pt   Atrophic condition of skin    Cellulitis/abscess - trunk    Cyst    shoulder   Diabetes mellitus without complication ([place])    Hyperlipidemia    Hypertension    Hypertrophic condition of skin    Lipoma    Obesity    Pneumonia    hx child   Sleep apnea    no cpap   Stones in the urinary tract    Vitamin D deficiency    Past Surgical History:  Procedure Laterality Date   cyst removed from [place] N/A 05/23/2015   Procedure: INSERTION OF MESH;  Surgeon: [name], MD;  Location: [place];  Service: General;  Laterality: N/A;   MASS EXCISION  07/15/11   left shoulder mass    NASAL SINUS SURGERY---UVULA REMOVED  2004   Dr. Wilburn Cornelia   PROSTATE BIOPSY  9/16   [place] N/A 05/23/2015   Procedure: LAPAROSCOPIC UMBILICAL HERNIA REPAIR ;  Surgeon: [name], MD;  Location: [facility];  Service: General;  Laterality: N/A;   Patient Active Problem List   Diagnosis Date Noted   OSA (obstructive sleep apnea) 01/15/2021   Chronic respiratory failure with hypoxia ([place]) 01/15/2021   Shortness of breath 01/15/2021   Physical deconditioning 01/15/2021   Malnutrition of moderate degree 05/18/2020   Uncontrolled type 2 diabetes mellitus with  hyperglycemia ([place]) 04/25/2020   Acute hypoxemic respiratory failure due to COVID-19 ([place]) 04/24/2020   Hypokalemia 04/24/2020   Hyperglycemia 04/24/2020   Acute respiratory failure with hypoxia ([place])    Suspected COVID-19 virus infection    S/P hernia repair 05/23/2015   Chronic cough 06/25/2011   LIPOMA, left shoulder. 04/04/2008   ALLERGIC RHINITIS 12/21/2007   GERD 12/21/2007   NEPHROLITHIASIS, HX OF 12/21/2007    PCP: Kristen Loader, FNP REFERRING PROVIDER: Dr Melina Schools    REFERRING DIAG: M54.50 (ICD-10-CM) - Low back pain, unspecified    THERAPY DIAG:  Chronic bilateral low back pain without sciatica - Plan: PT plan of care cert/re-cert   Cramp and spasm - Plan: PT plan of care cert/re-cert    PERTINENT HISTORY:  Lipoma of t-spine and sacral area; Arthritis  general PRECAUTIONS: None    SUBJECTIVE: Patient reports about 7/10 pain in the lower back today. Overall he feels like it is doing pretty good. He Dj'd over the weekend. He had mild pain> he had a friend do his set up.   PAIN:  Are you having pain? Yes VAS scale: 7/10 Pain location: Low back Pain orientation: Left  PAIN TYPE: aching Pain  description: intermittent  Aggravating factors: exercises last visit. Morning and nigh t Relieving factors: Rest   Today's treatment :   09/30 Pt seen for aquatic therapy today.  Treatment took place in water 3.25-4 ft in depth at the [facility] pool. Temp of water was 91.  Pt entered/exited the pool via stairs (step through pattern) independently with bilat rail.   Introduction to water. Had patient stand at different levels so she could feel the bouncy    Warm up: heel/toe walking x4 laps across pool chest deep side stepping x4 laps from shallow to deep;  Ambulation with noodle support: long strides, march cuing to use yellow noodle to stay supported.  Exercises; Slow march x20; squats x20; hip extension x20; hip abduction x20; Sit to stand x20;      board trunk flexion x10 lateral board rotation x10 each way seated    With neck noddle and foot float; push pull x20; side to side perturbations from the feet x20; press and push x20   Steps step up x20 each leg; lateral step up x20 each leg;    Seated LAQ and March x20 2lbs      Hamstring stretch 3x20 sec hold  Piriformis stretch 3x20 sec hold        Pt requires buoyancy for support and to offload joints with strengthening exercises. Viscosity of the water is needed for resistance of strengthening; water current perturbations provides challenge to standing balance unsupported, requiring increased core activation    10/7 Pt seen for aquatic therapy today.  Treatment took place in water 3.25-4 ft in depth at the [facility] pool. Temp of water was 91.  Pt entered/exited the pool via stairs (step through pattern) independently with bilat rail.   Introduction to water. Had patient stand at different levels so she could feel the bouncy    Warm up: heel/toe walking x4 laps across pool chest deep side stepping x4 laps from shallow to deep;  Ambulation with noodle support: long strides, march cuing to use yellow noodle to stay supported.    Exercises; Slow march x20; squats x20; hip extension x20; hip abduction x20; Sit to stand x20;     board trunk flexion x10 lateral board rotation x10 each way seated    Seated LAQ and March x20 2lbs    Hamstring stretch 3x20 sec hold  Piriformis stretch 3x20 sec hold    Standing yellow weight push pull with glute activation x20      Pt requires buoyancy for support and to offload joints with strengthening exercises. Viscosity of the water is needed for resistance of strengthening; water current perturbations provides challenge to standing balance unsupported, requiring increased core activation  10/14 Pt seen for aquatic therapy today.  Treatment took place in water 3.25-4 ft in depth at the [facility] pool. Temp of water was  91.  Pt entered/exited the pool via stairs (step through pattern) independently with bilat rail.     Warm up: heel/toe walking x4 laps across pool chest deep side stepping x4 laps from shallow to deep;  Ambulation with noodle support: long strides, march cuing to use yellow noodle to stay supported. Backwards walking 2x10    Exercises; Slow march x20; squats x20; hip extension x20; hip abduction x20; Sit to stand x20;     board trunk flexion x10 lateral board rotation x10 each way seated    Seated LAQ and March x20 2lbs   Step up 2x10; lateral step up 2x10 bilateral  Standing yellow weight push pull with glute activation x20   Walking with weights 2 laps; marching with weights 2 laps; side stepping with weights 2x10      Pt requires buoyancy for support and to offload joints with strengthening exercises. Viscosity of the water is needed for resistance of strengthening; water current perturbations provides challenge to standing balance unsupported, requiring increased core activation       REHAB POTENTIAL: Good   CLINICAL DECISION MAKING: Evolving/moderate complexity decreasing ability to stand and walk    EVALUATION COMPLEXITY: Moderate   PATIENT EDUCATION:  Education details: benefits of water  Person educated: Patient Education method: Explanation; demonstration  Education comprehension: verbalized understanding       CLINICAL IMPRESSION: Patient tolerates exercises in the pool well. He had no significant pain with treatment today. He seems to tolerate the pool better then land at this point. We continue to focus on exercises that pull him back to a neutral position. He has to do some jobs later. He was advised to be aware of his posture.   Abnormal gait, decreased activity tolerance, decreased endurance, decreased mobility, difficulty walking, decreased ROM, decreased strength, decreased safety awareness, increased fascial restrictions, impaired perceived functional  ability, impaired UE functional use, obesity, and pain   cleaning, driving, occupation, yard work, and shopping   Past/current experiences and 1-2 comorbidities: general arthritis; lipomas around the spien but no on the spine    REHAB POTENTIAL: Good   CLINICAL DECISION MAKING: Evolving/moderate complexity decreasing ability to stand and walk    EVALUATION COMPLEXITY: Moderate     GOALS: Goals reviewed with patient? Yes   SHORT TERM GOALS:   STG Name Target Date Goal status  1 Patient will perform full lumbar rotation without pain  Baseline:  05/22/2021 INITIAL  2 Patient will increase bilateral hip strength to 4+/5  Baseline:  05/22/2021 INITIAL  3 Patient will be independent with basic HEP  Baseline: 05/22/2021 INITIAL  LONG TERM GOALS:    LTG Name Target Date Goal status  1 Patient will stand for 1 hour without increased pain  Baseline: 06/12/2021 INITIAL  2 Patient will walk around the grocery store without increased pain  Baseline: 06/12/2021 INITIAL  3 Patient will be independent with a complete HEP in the pool and on land  Baseline: 06/12/2021 INITIAL  PT FREQUENCY: 2x/week   PT DURATION: 6 weeks   PLANNED INTERVENTIONS: Therapeutic exercises, Therapeutic activity, Neuro Muscular re-education, Balance training, Gait training, Patient/Family education, Joint mobilization, Stair training, Aquatic Therapy, Dry Needling, Electrical stimulation, Cryotherapy, Moist heat, Taping, Traction, Ultrasound, and Manual therapy   PLAN FOR NEXT SESSION: If in the pool work on standing exercises as neutral as possible; hip strengthening and core strengthening; on land consider TPDN and manual trigger point release; Patient did not tolerate supine well. Consider seated UE or LE core strengthening exercises              Carney Living PT DPT  05/18/2021, 3:50 PM

## 2021-05-21 ENCOUNTER — Encounter (HOSPITAL_BASED_OUTPATIENT_CLINIC_OR_DEPARTMENT_OTHER): Payer: Self-pay | Admitting: Physical Therapy

## 2021-05-21 ENCOUNTER — Ambulatory Visit (HOSPITAL_BASED_OUTPATIENT_CLINIC_OR_DEPARTMENT_OTHER): Payer: 59 | Admitting: Physical Therapy

## 2021-05-21 ENCOUNTER — Other Ambulatory Visit: Payer: Self-pay

## 2021-05-21 DIAGNOSIS — M545 Low back pain, unspecified: Secondary | ICD-10-CM

## 2021-05-21 DIAGNOSIS — R252 Cramp and spasm: Secondary | ICD-10-CM

## 2021-05-21 DIAGNOSIS — R2689 Other abnormalities of gait and mobility: Secondary | ICD-10-CM

## 2021-05-21 DIAGNOSIS — G8929 Other chronic pain: Secondary | ICD-10-CM

## 2021-05-21 NOTE — Therapy (Signed)
OUTPATIENT PHYSICAL THERAPY TREATMENT NOTE   Patient Name: Khalil Szczepanik MRN: [id number] DOB:April 05, 1956, 65 y.o., male Today's Date: 05/21/2021    PT End of Session - 05/21/21 1140     Visit Number 6    Number of Visits 12    Date for PT Re-Evaluation 06/12/21    Authorization Type [employer]    PT Start Time [phone]   Patient 8 minutes late   PT Stop Time 0931    PT Time Calculation (min) 38 min    Activity Tolerance Patient tolerated treatment well    Behavior During Therapy WFL for tasks assessed/performed             Past Medical History:  Diagnosis Date   Arthritis    Asthma    pulmonary allergies- no asthma per pt   Atrophic condition of skin    Cellulitis/abscess - trunk    Cyst    shoulder   Diabetes mellitus without complication ([place])    Hyperlipidemia    Hypertension    Hypertrophic condition of skin    Lipoma    Obesity    Pneumonia    hx child   Sleep apnea    no cpap   Stones in the urinary tract    Vitamin D deficiency    Past Surgical History:  Procedure Laterality Date   cyst removed from [place] N/A 05/23/2015   Procedure: INSERTION OF MESH;  Surgeon: [name], MD;  Location: [facility];  Service: General;  Laterality: N/A;   MASS EXCISION  07/15/11   left shoulder mass    NASAL SINUS SURGERY---UVULA REMOVED  2004   Dr. Wilburn Cornelia   PROSTATE BIOPSY  9/16   [place] N/A 05/23/2015   Procedure: LAPAROSCOPIC UMBILICAL HERNIA REPAIR ;  Surgeon: [name], MD;  Location: [facility];  Service: General;  Laterality: N/A;   Patient Active Problem List   Diagnosis Date Noted   OSA (obstructive sleep apnea) 01/15/2021   Chronic respiratory failure with hypoxia ([place]) 01/15/2021   Shortness of breath 01/15/2021   Physical deconditioning 01/15/2021   Malnutrition of moderate degree 05/18/2020   Uncontrolled type 2 diabetes mellitus with hyperglycemia ([place]) 04/25/2020   Acute  hypoxemic respiratory failure due to COVID-19 ([place]) 04/24/2020   Hypokalemia 04/24/2020   Hyperglycemia 04/24/2020   Acute respiratory failure with hypoxia ([place])    Suspected COVID-19 virus infection    S/P hernia repair 05/23/2015   Chronic cough 06/25/2011   LIPOMA, left shoulder. 04/04/2008   ALLERGIC RHINITIS 12/21/2007   GERD 12/21/2007   NEPHROLITHIASIS, HX OF 12/21/2007  PCP: Kristen Loader, FNP REFERRING PROVIDER: Melina Schools MD  PCP: Kristen Loader, FNP REFERRING PROVIDER: Dr Melina Schools    REFERRING DIAG: M54.50 (ICD-10-CM) - Low back pain, unspecified    THERAPY DIAG:  Chronic bilateral low back pain without sciatica - Plan: PT plan of care cert/re-cert   Cramp and spasm - Plan: PT plan of care cert/re-cert    PERTINENT HISTORY:  Lipoma of t-spine and sacral area; Arthritis  general PRECAUTIONS: None    SUBJECTIVE: Patient reports about 7/10 pain in the lower back today. Overall he feels like it is doing pretty good. He Dj'd over the weekend. He had mild pain> he had a friend do his set up.    PAIN:  Are you having pain? Yes VAS scale: 7/10 Pain location: Low back Pain orientation: Left  PAIN TYPE: aching Pain description: intermittent  Aggravating factors: exercises last visit. Morning and nigh t Relieving factors: Rest   Today's treatment :  10/7 Pt seen for aquatic therapy today.  Treatment took place in water 3.25-4 ft in depth at the [facility] pool. Temp of water was 91.  Pt entered/exited the pool via stairs (step through pattern) independently with bilat rail.   Introduction to water. Had patient stand at different levels so she could feel the bouncy    Warm up: heel/toe walking x4 laps across pool chest deep side stepping x4 laps from shallow to deep;  Ambulation with noodle support: long strides, march cuing to use yellow noodle to stay supported.    Exercises; Slow march x20; squats x20; hip extension x20; hip abduction x20; Sit  to stand x20;     board trunk flexion x10 lateral board rotation x10 each way seated    Seated LAQ and March x20 2lbs    Hamstring stretch 3x20 sec hold  Piriformis stretch 3x20 sec hold    Standing yellow weight push pull with glute activation x20      Pt requires buoyancy for support and to offload joints with strengthening exercises. Viscosity of the water is needed for resistance of strengthening; water current perturbations provides challenge to standing balance unsupported, requiring increased core activation   10/14 Pt seen for aquatic therapy today.  Treatment took place in water 3.25-4 ft in depth at the [facility] pool. Temp of water was 91.  Pt entered/exited the pool via stairs (step through pattern) independently with bilat rail.     Warm up: heel/toe walking x4 laps across pool chest deep side stepping x4 laps from shallow to deep;  Ambulation with noodle support: long strides, march cuing to use yellow noodle to stay supported. Backwards walking 2x10    Exercises; Slow march x20; squats x20; hip extension x20; hip abduction x20; Sit to stand x20;     board trunk flexion x10 lateral board rotation x10 each way seated    Seated LAQ and March x20 2lbs    Step up 2x10; lateral step up 2x10 bilateral     Standing yellow weight push pull with glute activation x20    Walking with weights 2 laps; marching with weights 2 laps; side stepping with weights 2x10      Pt requires buoyancy for support and to offload joints with strengthening exercises. Viscosity of the water is needed for resistance of strengthening; water current perturbations provides challenge to standing balance unsupported, requiring increased core activation    10/17  Pt seen for aquatic therapy today.  Treatment took place in water 3.25-4 ft in depth at the [facility] pool. Temp of water was 91.  Pt entered/exited the pool via stairs (step through pattern) independently with bilat  rail.   Introduction to water. Had patient stand at different levels so she could feel the bouncy    Warm up: heel/toe walking x4 laps across pool chest deep side stepping x4 laps from shallow to deep;  Ambulation with noodle support: long strides, march cuing to use yellow noodle to stay supported.    Exercises;; hip extension x20; hip abduction x20; Sit to stand x20;     board trunk flexion x10 lateral board rotation x10 each way seated    Seated LAQ and March x20 2lbs    Rainbow weight press seated 2x20      Pt requires buoyancy for support and to offload joints with  strengthening exercises. Viscosity of the water is needed for resistance of strengthening; water current perturbations provides challenge to standing balance unsupported, requiring increased core activation   REHAB POTENTIAL: Good   CLINICAL DECISION MAKING: Evolving/moderate complexity decreasing ability to stand and walk    EVALUATION COMPLEXITY: Moderate   PATIENT EDUCATION:  Education details: benefits of water  Person educated: Patient Education method: Explanation; demonstration  Education comprehension: verbalized understanding       CLINICAL IMPRESSION: Patient was limited today by time and pain. He had increased pain with standing exercises. He requested to sit on 2 occasions. He was having significant pain going down his legs. He has to do work on his car today> he was advised to be aware of his position and symptoms.  We will continue to progress as tolerated but at this time we have not been able to progress much.    Abnormal gait, decreased activity tolerance, decreased endurance, decreased mobility, difficulty walking, decreased ROM, decreased strength, decreased safety awareness, increased fascial restrictions, impaired perceived functional ability, impaired UE functional use, obesity, and pain   cleaning, driving, occupation, yard work, and shopping   Past/current experiences and 1-2  comorbidities: general arthritis; lipomas around the spien but no on the spine    REHAB POTENTIAL: Good   CLINICAL DECISION MAKING: Evolving/moderate complexity decreasing ability to stand and walk    EVALUATION COMPLEXITY: Moderate     GOALS: Goals reviewed with patient? Yes   SHORT TERM GOALS:   STG Name Target Date Goal status  1 Patient will perform full lumbar rotation without pain  Baseline:  05/22/2021 INITIAL  2 Patient will increase bilateral hip strength to 4+/5  Baseline:  05/22/2021 INITIAL  3 Patient will be independent with basic HEP  Baseline: 05/22/2021 INITIAL  LONG TERM GOALS:    LTG Name Target Date Goal status  1 Patient will stand for 1 hour without increased pain  Baseline: 06/12/2021 INITIAL  2 Patient will walk around the grocery store without increased pain  Baseline: 06/12/2021 INITIAL  3 Patient will be independent with a complete HEP in the pool and on land  Baseline: 06/12/2021 INITIAL  PT FREQUENCY: 2x/week   PT DURATION: 6 weeks   PLANNED INTERVENTIONS: Therapeutic exercises, Therapeutic activity, Neuro Muscular re-education, Balance training, Gait training, Patient/Family education, Joint mobilization, Stair training, Aquatic Therapy, Dry Needling, Electrical stimulation, Cryotherapy, Moist heat, Taping, Traction, Ultrasound, and Manual therapy   PLAN FOR NEXT SESSION: If in the pool work on standing exercises as neutral as possible; hip strengthening and core strengthening; on land consider TPDN and manual trigger point release; Patient did not tolerate supine well. Consider seated UE or LE core strengthening exercises      Carney Living PT DPT  05/21/2021, 12:20 PM

## 2021-05-22 ENCOUNTER — Encounter (HOSPITAL_COMMUNITY): Payer: 59

## 2021-05-22 NOTE — Progress Notes (Signed)
Pulmonary Individual Treatment Plan  Patient Details  Name: Clayton [name] MRN: [id number] Date of Birth: 1956-02-14 Referring Provider:   April Manson Pulmonary Rehab Walk Test from 03/30/2021 in [place]  Referring Provider Dr. 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, unspecified  Pneumonia due to coronavirus disease 2019 (CODE)  Patient's Home Medications on Admission:   Current Outpatient Medications:    acetaminophen (TYLENOL) 325 MG tablet, Take 2 tablets (650 mg total) by mouth every 6 (six) hours as needed for fever., Disp: , Rfl:    albuterol (VENTOLIN HFA) 108 (90 Base) MCG/ACT inhaler, Inhale 2 puffs into the lungs every 4 (four) hours as needed for wheezing or shortness of breath., Disp: 1 each, Rfl: 0   Apixaban (ELIQUIS PO), Take 1 tablet by mouth in the morning and at bedtime., Disp: , Rfl:    atorvastatin (LIPITOR) 40 MG tablet, Take 1 tablet by mouth daily., Disp: , Rfl:    clonazePAM (KLONOPIN) 0.5 MG tablet, Take 0.5 mg by mouth 3 (three) times daily as needed., Disp: , Rfl:    ipratropium-albuterol (DUONEB) 0.5-2.5 (3) MG/3ML SOLN, , Disp: , Rfl:    metFORMIN (GLUCOPHAGE-XR) 750 MG 24 hr tablet, metformin ER 750 mg tablet,extended release 24 hr, Disp: , Rfl:    montelukast (SINGULAIR) 10 MG tablet, Take 10 mg by mouth daily., Disp: , Rfl:    sertraline (ZOLOFT) 100 MG tablet, Take 100 mg by mouth daily., Disp: , Rfl:    sildenafil (REVATIO) 20 MG tablet, SMARTSIG:1-5 Tablet(s) By Mouth Daily PRN (Patient not taking: No sig reported), Disp: , Rfl:    sitaGLIPtin (JANUVIA) 100 MG tablet, Take 1 tablet by mouth daily., Disp: , Rfl:    sodium chloride (OCEAN) 0.65 % SOLN nasal spray, Place 1 spray into both nostrils as needed for congestion (nose irritation, give 1st for  congestion)., Disp: 30 mL, Rfl: 0   terbinafine (LAMISIL) 250 MG tablet, Take 250 mg by mouth daily. (Patient not taking: No sig reported), Disp: , Rfl:   Past Medical History: Past Medical History:  Diagnosis Date   Arthritis    Asthma    pulmonary allergies- no asthma per pt   Atrophic condition of skin    Cellulitis/abscess - trunk    Cyst    shoulder   Diabetes mellitus without complication (HCC)    Hyperlipidemia    Hypertension    Hypertrophic condition of skin    Lipoma    Obesity    Pneumonia    hx child   Sleep apnea    no cpap   Stones in the urinary tract    Vitamin D deficiency     Tobacco Use: Social History   Tobacco Use  Smoking Status Never  Smokeless Tobacco Never    Labs: Recent Review Flowsheet Data     Labs for ITP Cardiac and Pulmonary Rehab Latest Ref Rng & Units 04/24/2020 04/25/2020 05/05/2020 05/16/2020 05/31/2020   Cholestrol 0 - 200 mg/dL - 169 - - -   LDLCALC 0 - 99 mg/dL - 112(H) - - -   LDLDIRECT mg/dL - - - - -   HDL >40 mg/dL - 32(L) - - -   Trlycerides <150 mg/dL 163(H) 123 - - -   Hemoglobin A1c 4.8 - 5.6 % -  8.4(H) - - 8.6(H)   PHART 7.350 - 7.450 - - 7.440 7.455(H) -   PCO2ART 32.0 - 48.0 mmHg - - 45.7 50.5(H) -   HCO3 20.0 - 28.0 mmol/L - - 30.6(H) 35.1(H) -   O2SAT % - - 96.4 86.6 -       Capillary Blood Glucose: Lab Results  Component Value Date   GLUCAP 113 (H) 04/05/2021   GLUCAP 168 (H) 04/05/2021   GLUCAP 191 (H) 04/03/2021   GLUCAP 165 (H) 04/03/2021   GLUCAP 219 (H) 05/30/2020    POCT Glucose     Row Name 04/10/21 1140             POCT Blood Glucose   Pre-Exercise 168 mg/dL       Post-Exercise 113 mg/dL                Pulmonary Assessment Scores:  Pulmonary Assessment Scores     Row Name 03/30/21 1205         ADL UCSD   ADL Phase Entry     SOB Score total 54           CAT Score   CAT Score 22 - pre           mMRC Score   mMRC Score 4             UCSD: Self-administered  rating of dyspnea associated with activities of daily living (ADLs) 6-point scale (0 = "not at all" to 5 = "maximal or unable to do because of breathlessness")  Scoring Scores range from 0 to 120.  Minimally important difference is 5 units  CAT: CAT can identify the health impairment of COPD patients and is better correlated with disease progression.  CAT has a scoring range of zero to 40. The CAT score is classified into four groups of low (less than 10), medium (10 - 20), high (21-30) and very high (31-40) based on the impact level of disease on health status. A CAT score over 10 suggests significant symptoms.  A worsening CAT score could be explained by an exacerbation, poor medication adherence, poor inhaler technique, or progression of COPD or comorbid conditions.  CAT MCID is 2 points  mMRC: mMRC (Modified Medical Research Council) Dyspnea Scale is used to assess the degree of baseline functional disability in patients of respiratory disease due to dyspnea. No minimal important difference is established. A decrease in score of 1 point or greater is considered a positive change.   Pulmonary Function Assessment:   Exercise Target Goals: Exercise Program Goal: Individual exercise prescription set using results from initial 6 min walk test and THRR while considering  patient's activity barriers and safety.   Exercise Prescription Goal: Initial exercise prescription builds to 30-45 minutes a day of aerobic activity, 2-3 days per week.  Home exercise guidelines will be given to patient during program as part of exercise prescription that the participant will acknowledge.  Activity Barriers & Risk Stratification:  Activity Barriers & Cardiac Risk Stratification - 03/30/21 1151       Activity Barriers & Cardiac Risk Stratification   Activity Barriers Back Problems;Deconditioning;Shortness of Breath;History of Falls;Balance Concerns    Cardiac Risk Stratification Moderate              6 Minute Walk:  6 Minute Walk     Row Name 03/30/21 1328         6 Minute Walk   Phase Initial     Distance 1460  feet     Walk Time 6 minutes     # of Rest Breaks 0     MPH 2.77     METS 3.3     RPE 14     Perceived Dyspnea  1     VO2 Peak 11.54     Symptoms Yes (comment)     Comments Back pain 7/10     Resting HR 77 bpm     Resting BP 140/68     Resting Oxygen Saturation  95 %     Exercise Oxygen Saturation  during 6 min walk 90 %     Max Ex. HR 112 bpm     Max Ex. BP 150/70     2 Minute Post BP 142/70           Interval HR   1 Minute HR 102     2 Minute HR 105     3 Minute HR 112     4 Minute HR 104     5 Minute HR 107     6 Minute HR 111     2 Minute Post HR 74     Interval Heart Rate? Yes           Interval Oxygen   Interval Oxygen? Yes     Baseline Oxygen Saturation % 95 %     1 Minute Oxygen Saturation % 91 %     1 Minute Liters of Oxygen 0 L     2 Minute Oxygen Saturation % 91 %     2 Minute Liters of Oxygen 0 L     3 Minute Oxygen Saturation % 90 %     3 Minute Liters of Oxygen 0 L     4 Minute Oxygen Saturation % 91 %     4 Minute Liters of Oxygen 0 L     5 Minute Oxygen Saturation % 90 %     5 Minute Liters of Oxygen 0 L     6 Minute Oxygen Saturation % 91 %     6 Minute Liters of Oxygen 0 L     2 Minute Post Oxygen Saturation % 95 %     2 Minute Post Liters of Oxygen 74 L              Oxygen Initial Assessment:  Oxygen Initial Assessment - 04/12/21 1643       Home Oxygen   Home Oxygen Device Home Concentrator;Portable Concentrator;E-Tanks   [employer]   Sleep Oxygen Prescription CPAP;Continuous    Liters per minute 2    Home Exercise Oxygen Prescription None    Home Resting Oxygen Prescription Continuous    Liters per minute 2      Initial 6 min Walk   Oxygen Used None      Program Oxygen Prescription   Program Oxygen Prescription None      Intervention   Short Term Goals To learn and exhibit compliance with  exercise, home and travel O2 prescription;To learn and understand importance of monitoring SPO2 with pulse oximeter and demonstrate accurate use of the pulse oximeter.;To learn and understand importance of maintaining oxygen saturations>88%;To learn and demonstrate proper pursed lip breathing techniques or other breathing techniques. ;To learn and demonstrate proper use of respiratory medications    Long  Term Goals Exhibits compliance with exercise, home  and travel O2 prescription;Verbalizes importance of monitoring SPO2 with pulse oximeter and return demonstration;Maintenance of O2 saturations>88%;Exhibits proper breathing techniques,  such as pursed lip breathing or other method taught during program session;Compliance with respiratory medication;Demonstrates proper use of MDI's             Oxygen Re-Evaluation:  Oxygen Re-Evaluation     Row Name 04/12/21 1643 05/08/21 0719           Program Oxygen Prescription   Program Oxygen Prescription -- None             Home Oxygen   Home Oxygen Device -- Home Concentrator;Portable Concentrator;E-Tanks  [employer]      Sleep Oxygen Prescription -- CPAP;Continuous      Liters per minute -- 2      Home Exercise Oxygen Prescription -- None      Home Resting Oxygen Prescription -- Continuous      Liters per minute -- 2             Goals/Expected Outcomes   Short Term Goals -- To learn and exhibit compliance with exercise, home and travel O2 prescription;To learn and understand importance of monitoring SPO2 with pulse oximeter and demonstrate accurate use of the pulse oximeter.;To learn and understand importance of maintaining oxygen saturations>88%;To learn and demonstrate proper pursed lip breathing techniques or other breathing techniques. ;To learn and demonstrate proper use of respiratory medications      Long  Term Goals -- Exhibits compliance with exercise, home  and travel O2 prescription;Verbalizes importance of monitoring SPO2 with  pulse oximeter and return demonstration;Maintenance of O2 saturations>88%;Exhibits proper breathing techniques, such as pursed lip breathing or other method taught during program session;Compliance with respiratory medication;Demonstrates proper use of MDI's      Goals/Expected Outcomes Compliance and understanding of monitoring oxygen saturation and importance of pursed lip breathing Compliance and understanding of monitoring oxygen saturation and importance of breathing techniques to decrease shortness of breath.               Oxygen Discharge (Final Oxygen Re-Evaluation):  Oxygen Re-Evaluation - 05/08/21 0719       Program Oxygen Prescription   Program Oxygen Prescription None      Home Oxygen   Home Oxygen Device Home Concentrator;Portable Concentrator;E-Tanks   [employer]   Sleep Oxygen Prescription CPAP;Continuous    Liters per minute 2    Home Exercise Oxygen Prescription None    Home Resting Oxygen Prescription Continuous    Liters per minute 2      Goals/Expected Outcomes   Short Term Goals To learn and exhibit compliance with exercise, home and travel O2 prescription;To learn and understand importance of monitoring SPO2 with pulse oximeter and demonstrate accurate use of the pulse oximeter.;To learn and understand importance of maintaining oxygen saturations>88%;To learn and demonstrate proper pursed lip breathing techniques or other breathing techniques. ;To learn and demonstrate proper use of respiratory medications    Long  Term Goals Exhibits compliance with exercise, home  and travel O2 prescription;Verbalizes importance of monitoring SPO2 with pulse oximeter and return demonstration;Maintenance of O2 saturations>88%;Exhibits proper breathing techniques, such as pursed lip breathing or other method taught during program session;Compliance with respiratory medication;Demonstrates proper use of MDI's    Goals/Expected Outcomes Compliance and understanding of monitoring  oxygen saturation and importance of breathing techniques to decrease shortness of breath.             Initial Exercise Prescription:  Initial Exercise Prescription - 03/30/21 1300       Date of Initial Exercise RX and Referring Provider   Date 03/30/21  Referring Provider Dr. Erin Fulling    Expected Discharge Date 05/31/21      NuStep   Level 2    SPM 80    Minutes 15      Track   Minutes 15      Prescription Details   Frequency (times per week) 2    Duration Progress to 30 minutes of continuous aerobic without signs/symptoms of physical distress      Intensity   THRR 40-80% of Max Heartrate 63-126    Ratings of Perceived Exertion 11-13    Perceived Dyspnea 0-4      Progression   Progression Continue to progress workloads to maintain intensity without signs/symptoms of physical distress.      Resistance Training   Training Prescription Yes    Weight Blue bands    Reps 10-15             Perform Capillary Blood Glucose checks as needed.  Exercise Prescription Changes:   Exercise Prescription Changes     Row Name 04/05/21 1140 04/24/21 1200 05/08/21 1200 05/15/21 1211       Response to Exercise   Blood Pressure (Admit) 148/70 118/80 124/70 138/88    Blood Pressure (Exercise) 142/70 128/80 118/60 --    Blood Pressure (Exit) 130/64 144/80 124/64 150/80    Heart Rate (Admit) 88 bpm 79 bpm 91 bpm 63 bpm    Heart Rate (Exercise) 91 bpm 111 bpm 104 bpm 92 bpm    Heart Rate (Exit) 80 bpm 75 bpm 82 bpm 69 bpm    Oxygen Saturation (Admit) 95 % 96 % 95 % 97 %    Oxygen Saturation (Exercise) 95 % 93 % 94 % 93 %    Oxygen Saturation (Exit) 96 % 96 % 96 % 95 %    Rating of Perceived Exertion (Exercise) 12 12 12.5 12    Perceived Dyspnea (Exercise) _0 Duration Continue with 30 min of aerobic exercise without signs/symptoms of physical distress. Continue with 30 min of aerobic exercise without signs/symptoms of physical distress. Continue with 30 min of aerobic  exercise without signs/symptoms of physical distress. Continue with 30 min of aerobic exercise without signs/symptoms of physical distress.    Intensity --  40-80% HRR THRR unchanged THRR unchanged THRR unchanged         Resistance Training   Training Prescription Yes Yes Yes Yes    Weight Blue Bands Blue Bands Blue Bands Blue bands    Reps 10-15 10-15 10-15 10-15    Time 10 Minutes 10 Minutes 10 Minutes 10 Minutes         NuStep   Level _1 SPM 80 80 80 80    Minutes _2 METs 1.7  did not walk on track d/t nutrition consult 2._3 Track   Laps -- _4 Minutes -- _5 Exercise Comments:   Exercise Comments     Row Name 04/03/21 1215           Exercise Comments Pt completed first day of exercise. Pt tolerated exercise on the NuStep and track well. Pt averaged 2.2 METs on the NuStep and completed 5 laps. Pt updated nurse on health history and had to set up his phone before walking. Pt completed  resistance band and cool down exercises without issues.                Exercise Goals and Review:   Exercise Goals     Row Name 03/30/21 1152 04/12/21 1645 05/08/21 0713         Exercise Goals   Increase Physical Activity Yes Yes Yes     Intervention Develop an individualized exercise prescription for aerobic and resistive training based on initial evaluation findings, risk stratification, comorbidities and participant's personal goals.;Provide advice, education, support and counseling about physical activity/exercise needs. Develop an individualized exercise prescription for aerobic and resistive training based on initial evaluation findings, risk stratification, comorbidities and participant's personal goals.;Provide advice, education, support and counseling about physical activity/exercise needs. Develop an individualized exercise prescription for aerobic and resistive training based on initial evaluation findings, risk  stratification, comorbidities and participant's personal goals.;Provide advice, education, support and counseling about physical activity/exercise needs.     Expected Outcomes Short Term: Attend rehab on a regular basis to increase amount of physical activity.;Long Term: Add in home exercise to make exercise part of routine and to increase amount of physical activity.;Long Term: Exercising regularly at least 3-5 days a week. Short Term: Attend rehab on a regular basis to increase amount of physical activity.;Long Term: Add in home exercise to make exercise part of routine and to increase amount of physical activity.;Long Term: Exercising regularly at least 3-5 days a week. Short Term: Attend rehab on a regular basis to increase amount of physical activity.;Long Term: Add in home exercise to make exercise part of routine and to increase amount of physical activity.;Long Term: Exercising regularly at least 3-5 days a week.     Increase Strength and Stamina Yes Yes Yes     Intervention Provide advice, education, support and counseling about physical activity/exercise needs.;Develop an individualized exercise prescription for aerobic and resistive training based on initial evaluation findings, risk stratification, comorbidities and participant's personal goals. Provide advice, education, support and counseling about physical activity/exercise needs.;Develop an individualized exercise prescription for aerobic and resistive training based on initial evaluation findings, risk stratification, comorbidities and participant's personal goals. Provide advice, education, support and counseling about physical activity/exercise needs.;Develop an individualized exercise prescription for aerobic and resistive training based on initial evaluation findings, risk stratification, comorbidities and participant's personal goals.     Expected Outcomes Short Term: Increase workloads from initial exercise prescription for resistance,  speed, and METs.;Short Term: Perform resistance training exercises routinely during rehab and add in resistance training at home;Long Term: Improve cardiorespiratory fitness, muscular endurance and strength as measured by increased METs and functional capacity (6MWT) Short Term: Increase workloads from initial exercise prescription for resistance, speed, and METs.;Short Term: Perform resistance training exercises routinely during rehab and add in resistance training at home;Long Term: Improve cardiorespiratory fitness, muscular endurance and strength as measured by increased METs and functional capacity (6MWT) Short Term: Increase workloads from initial exercise prescription for resistance, speed, and METs.;Short Term: Perform resistance training exercises routinely during rehab and add in resistance training at home;Long Term: Improve cardiorespiratory fitness, muscular endurance and strength as measured by increased METs and functional capacity (6MWT)     Able to understand and use rate of perceived exertion (RPE) scale Yes Yes Yes     Intervention Provide education and explanation on how to use RPE scale Provide education and explanation on how to use RPE scale Provide education and explanation on how to use RPE scale     Expected Outcomes Short  Term: Able to use RPE daily in rehab to express subjective intensity level;Long Term:  Able to use RPE to guide intensity level when exercising independently Short Term: Able to use RPE daily in rehab to express subjective intensity level;Long Term:  Able to use RPE to guide intensity level when exercising independently Short Term: Able to use RPE daily in rehab to express subjective intensity level;Long Term:  Able to use RPE to guide intensity level when exercising independently     Able to understand and use Dyspnea scale Yes Yes Yes     Intervention Provide education and explanation on how to use Dyspnea scale Provide education and explanation on how to use  Dyspnea scale Provide education and explanation on how to use Dyspnea scale     Expected Outcomes Short Term: Able to use Dyspnea scale daily in rehab to express subjective sense of shortness of breath during exertion;Long Term: Able to use Dyspnea scale to guide intensity level when exercising independently Short Term: Able to use Dyspnea scale daily in rehab to express subjective sense of shortness of breath during exertion;Long Term: Able to use Dyspnea scale to guide intensity level when exercising independently Short Term: Able to use Dyspnea scale daily in rehab to express subjective sense of shortness of breath during exertion;Long Term: Able to use Dyspnea scale to guide intensity level when exercising independently     Knowledge and understanding of Target Heart Rate Range (THRR) Yes Yes Yes     Intervention Provide education and explanation of THRR including how the numbers were predicted and where they are located for reference Provide education and explanation of THRR including how the numbers were predicted and where they are located for reference Provide education and explanation of THRR including how the numbers were predicted and where they are located for reference     Expected Outcomes Short Term: Able to state/look up THRR;Short Term: Able to use daily as guideline for intensity in rehab;Long Term: Able to use THRR to govern intensity when exercising independently Short Term: Able to state/look up THRR;Short Term: Able to use daily as guideline for intensity in rehab;Long Term: Able to use THRR to govern intensity when exercising independently Short Term: Able to state/look up THRR;Short Term: Able to use daily as guideline for intensity in rehab;Long Term: Able to use THRR to govern intensity when exercising independently     Able to check pulse independently Yes Yes Yes     Intervention Provide education and demonstration on how to check pulse in carotid and radial arteries.;Review the  importance of being able to check your own pulse for safety during independent exercise Provide education and demonstration on how to check pulse in carotid and radial arteries.;Review the importance of being able to check your own pulse for safety during independent exercise Provide education and demonstration on how to check pulse in carotid and radial arteries.;Review the importance of being able to check your own pulse for safety during independent exercise     Expected Outcomes Short Term: Able to explain why pulse checking is important during independent exercise;Long Term: Able to check pulse independently and accurately Short Term: Able to explain why pulse checking is important during independent exercise;Long Term: Able to check pulse independently and accurately Short Term: Able to explain why pulse checking is important during independent exercise;Long Term: Able to check pulse independently and accurately     Understanding of Exercise Prescription Yes Yes Yes     Intervention Provide education, explanation, and  written materials on patient's individual exercise prescription Provide education, explanation, and written materials on patient's individual exercise prescription Provide education, explanation, and written materials on patient's individual exercise prescription     Expected Outcomes Short Term: Able to explain program exercise prescription;Long Term: Able to explain home exercise prescription to exercise independently Short Term: Able to explain program exercise prescription;Long Term: Able to explain home exercise prescription to exercise independently Short Term: Able to explain program exercise prescription;Long Term: Able to explain home exercise prescription to exercise independently              Exercise Goals Re-Evaluation :  Exercise Goals Re-Evaluation     Row Name 04/12/21 1645 05/08/21 0713           Exercise Goal Re-Evaluation   Exercise Goals Review Increase  Physical Activity;Increase Strength and Stamina;Able to understand and use rate of perceived exertion (RPE) scale;Able to understand and use Dyspnea scale;Knowledge and understanding of Target Heart Rate Range (THRR);Understanding of Exercise Prescription Increase Physical Activity;Increase Strength and Stamina;Able to understand and use rate of perceived exertion (RPE) scale;Able to understand and use Dyspnea scale;Knowledge and understanding of Target Heart Rate Range (THRR);Understanding of Exercise Prescription      Comments Clayton [name] has completed 2 exercises sessions. He has tolerated exercise fair on the Nustep and track. He averaged 2.2 METs at level 2 on the Nustep and 5 laps on the track. 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 I told him I would adjust his exercise prescription as necessary. He is motivated to exercise. He completes all of the warmup exercises standing up and some of the cooldown exercises sitting down because of his chronic back pain. Will continue to monitor and progress as able. Clayton [name] has completed 8 exercises sessions. He has been sent home a couple of times due to tardiness and procedures. He exercises on the Nustep and track for 15 min. Clayton [name] tolerates exercise well and has progressed on the Nustep and track. He averages 2.9 METs at level 4 on the Nustep and 2.51 METs on the track. He performs the warm up standing and cooldown seated due to his chronic back pain. He has mentioned to me that his doctor has referred him to an water aerobics program for his back pain. He began this program last week. Will continue to monitor and progress as able.      Expected Outcomes Through exercise at rehab and home, the pt will decrease SOB with daily activities and feel confident in carrying out an exercise regimen at home. Through exercise at rehab and home, the pt will decrease shortness of breath with daily activities and feel confident in  carrying out an exercise regimen at home.               Discharge Exercise Prescription (Final Exercise Prescription Changes):  Exercise Prescription Changes - 05/15/21 1211       Response to Exercise   Blood Pressure (Admit) 138/88    Blood Pressure (Exit) 150/80    Heart Rate (Admit) 63 bpm    Heart Rate (Exercise) 92 bpm    Heart Rate (Exit) 69 bpm    Oxygen Saturation (Admit) 97 %    Oxygen Saturation (Exercise) 93 %    Oxygen Saturation (Exit) 95 %    Rating of Perceived Exertion (Exercise) 12    Perceived Dyspnea (Exercise) 2    Duration Continue with 30 min of aerobic exercise without  signs/symptoms of physical distress.    Intensity THRR unchanged      Resistance Training   Training Prescription Yes    Weight Blue bands    Reps 10-15    Time 10 Minutes      NuStep   Level 5    SPM 80    Minutes 15    METs 3      Track   Laps 16    Minutes 15             Nutrition:  Target Goals: Understanding of nutrition guidelines, daily intake of sodium <1521m, cholesterol <2035m calories 30% from fat and 7% or less from saturated fats, daily to have 5 or more servings of fruits and vegetables.  Biometrics:  Pre Biometrics - 03/30/21 1351       Pre Biometrics   Height _0  (1.702 m)    Weight 106 kg    BMI (Calculated) 36.59    Grip Strength 20 kg              Nutrition Therapy Plan and Nutrition Goals:  Nutrition Therapy & Goals - 04/05/21 1205       Nutrition Therapy   Diet General Healthful      Personal Nutrition Goals   Nutrition Goal Pt to identify food quantities necessary to achieve weight loss of 6-24 lb at graduation from pulmonary rehab.    Personal Goal #2 Pt to build a healthy plate including vegetables, fruits, whole grains, and low-fat dairy products in a heart healthy meal plan.    Personal Goal #3 Pt to eat out/eat fast food less than 2 times per week      Intervention Plan   Intervention Prescribe, educate and counsel  regarding individualized specific dietary modifications aiming towards targeted core components such as weight, hypertension, lipid management, diabetes, heart failure and other comorbidities.;Nutrition handout(s) given to patient.    Expected Outcomes Long Term Goal: Adherence to prescribed nutrition plan.;Short Term Goal: A plan has been developed with personal nutrition goals set during dietitian appointment.             Nutrition Assessments:  MEDIFICTS Score Key: ?70 Need to make dietary changes  40-70 Heart Healthy Diet ? 40 Therapeutic Level Cholesterol Diet  Flowsheet Row PULMONARY REHAB CHRONIC OBSTRUCTIVE PULMONARY DISEASE [facility] Total Score on Ad[facility] 64      Picture Your Plate Scores: <4<89nhealthy dietary pattern with much room for improvement. 41-50 Dietary pattern unlikely to meet recommendations for good health and room for improvement. 51-60 More healthful dietary pattern, with some room for improvement.  >60 Healthy dietary pattern, although there may be some specific behaviors that could be improved.    Nutrition Goals Re-Evaluation:  Nutrition Goals Re-Evaluation     RoWellmaname 04/05/21 1206 04/20/21 1412           Goals   Current Weight 233 lb (105.7 kg) 236 lb 8.9 oz (107.3 kg)      Nutrition Goal Pt to identify food quantities necessary to achieve weight loss of 6-24 lb at graduation from pulmonary rehab. Pt to identify food quantities necessary to achieve weight loss of 6-24 lb at graduation from pulmonary rehab.             Personal Goal #2 Re-Evaluation   Personal Goal #2 Pt to build a healthy plate including vegetables, fruits, whole grains, and low-fat dairy products in a  heart healthy meal plan. Pt to build a healthy plate including vegetables, fruits, whole grains, and low-fat dairy products in a heart healthy meal plan.             Personal Goal #3 Re-Evaluation    Personal Goal #3 Pt to eat out/eat fast food less than 2 times per week Pt to eat out/eat fast food less than 2 times per week               Nutrition Goals Discharge (Final Nutrition Goals Re-Evaluation):  Nutrition Goals Re-Evaluation - 04/20/21 1412       Goals   Current Weight 236 lb 8.9 oz (107.3 kg)    Nutrition Goal Pt to identify food quantities necessary to achieve weight loss of 6-24 lb at graduation from pulmonary rehab.      Personal Goal #2 Re-Evaluation   Personal Goal #2 Pt to build a healthy plate including vegetables, fruits, whole grains, and low-fat dairy products in a heart healthy meal plan.      Personal Goal #3 Re-Evaluation   Personal Goal #3 Pt to eat out/eat fast food less than 2 times per week             Psychosocial: Target Goals: Acknowledge presence or absence of significant depression and/or stress, maximize coping skills, provide positive support system. Participant is able to verbalize types and ability to use techniques and skills needed for reducing stress and depression.  Initial Review & Psychosocial Screening:  Initial Psych Review & Screening - 03/30/21 1058       Initial Review   Current issues with Current Depression;Current Anxiety/Panic;Current Stress Concerns    Source of Stress Concerns Chronic Illness;Family;Unable to participate in former interests or hobbies;Unable to perform yard/household activities;Financial    Comments Clayton [name] at times feels overwhelmed by dependence of his family.  His wife does not work or help keep the house clean. His 22 year old mother who has memory issues lives with him and requires physical help.  Clayton [name] 52 year old son who has Autism and ADHD acts out often and struggles with normal adolecent issues. Although Clayton [name] does acknowledge that if he needs something they will help but in the areas that they could help themselves they do not.      Family Dynamics   Good Support System? No    Strains Illness and  family care strain;Intra-family strains    Concerns Inappropriate over/under dependence on family/friends      Barriers   Psychosocial barriers to participate in program The patient should benefit from training in stress management and relaxation.      Screening Interventions   Interventions Encouraged to exercise;To provide support and resources with identified psychosocial needs    Expected Outcomes Long Term Goal: Stressors or current issues are controlled or eliminated.;Short Term goal: Identification and review with participant of any Quality of Life or Depression concerns found by scoring the questionnaire.;Long Term goal: The participant improves quality of Life and PHQ9 Scores as seen by post scores and/or verbalization of changes             Quality of Life Scores:  Scores of 19 and below usually indicate a poorer quality of life in these areas.  A difference of  2-3 points is a clinically meaningful difference.  A difference of 2-3 points in the total score of the Quality of Life Index has been associated with significant improvement in overall quality of life, self-image, physical symptoms, and general  health in studies assessing change in quality of life.  PHQ-9: Recent Review Flowsheet Data     Depression screen [facility] 2/9 03/30/2021 08/25/2015 08/16/2015 08/07/2015 08/04/2015   Decreased Interest 2 0 0 0 0   Down, Depressed, Hopeless 2 0 0 0 0   PHQ - 2 Score 4 0 0 0 0   Altered sleeping 3 - - - -   Tired, decreased energy 3 - - - -   Change in appetite 0 - - - -   Feeling bad or failure about yourself  0 - - - -   Trouble concentrating 2 - - - -   Moving slowly or fidgety/restless 0 - - - -   Suicidal thoughts 0 - - - -   PHQ-9 Score 12 - - - -   Difficult doing work/chores Very difficult - - - -      Interpretation of Total Score  Total Score Depression Severity:  1-4 = Minimal depression, 5-9 = Mild depression, 10-14 = Moderate depression, 15-19 = Moderately severe  depression, 20-27 = Severe depression   Psychosocial Evaluation and Intervention:   Psychosocial Re-Evaluation:  Psychosocial Re-Evaluation     Clayton [name] Name 04/16/21 1038 05/07/21 1151           Psychosocial Re-Evaluation   Current issues with Current Depression;Current Anxiety/Panic;Current Stress Concerns Current Depression;Current Anxiety/Panic;Current Stress Concerns      Comments He has attended 2 exercise sessions, no change in psychosocial concerns since orientation.  He does show anxiety when coming to class, he has chronic back pain which makes exercise uncomfortable for him.  He has stressors at home, his 49 y/o mother and autistic son who live with him and his wife. "Clayton [name]" has financial concerns since he has not been able to return to work post covid-19 virus.  His mother lives with him and his son has ADHD and goes to a private school which is expensive. I will recommend talking with a therapist to assist him in dealing with these stressors.      Expected Outcomes For Clayton [name] to learn methods of relaxation and to handle stress in healthy ways. For Clayton [name] to be able to handle the current stressors in healthy ways and to seek out professionals to assist him.      Interventions Encouraged to attend Pulmonary Rehabilitation for the exercise;Relaxation education;Stress management education Stress management education;Relaxation education;Encouraged to attend Pulmonary Rehabilitation for the exercise;Therapist referral      Continue Psychosocial Services  Follow up required by staff Follow up required by staff             Initial Review   Source of Stress Concerns Chronic Illness;Unable to participate in former interests or hobbies;Family;Unable to perform yard/household activities Chronic Illness;Poor Coping Skills;Unable to perform yard/household activities;Unable to participate in former interests or hobbies               Psychosocial Discharge (Final Psychosocial Re-Evaluation):   Psychosocial Re-Evaluation - 05/07/21 1151       Psychosocial Re-Evaluation   Current issues with Current Depression;Current Anxiety/Panic;Current Stress Concerns    Comments "Clayton [name]" has financial concerns since he has not been able to return to work post covid-19 virus.  His mother lives with him and his son has ADHD and goes to a private school which is expensive. I will recommend talking with a therapist to assist him in dealing with these stressors.    Expected Outcomes For Clayton [name] to be able to handle the current  stressors in healthy ways and to seek out professionals to assist him.    Interventions Stress management education;Relaxation education;Encouraged to attend Pulmonary Rehabilitation for the exercise;Therapist referral    Continue Psychosocial Services  Follow up required by staff      Initial Review   Source of Stress Concerns Chronic Illness;Poor Coping Skills;Unable to perform yard/household activities;Unable to participate in former interests or hobbies             Education: Education Goals: Education classes will be provided on a weekly basis, covering required topics. Participant will state understanding/return demonstration of topics presented.  Learning Barriers/Preferences:  Learning Barriers/Preferences - 03/30/21 1119       Learning Barriers/Preferences   Learning Barriers Sight;Hearing;Exercise Concerns    Learning Preferences Audio;Computer/Internet;Group Instruction;Individual Instruction;Pictoral;Skilled Demonstration;Verbal Instruction;Video;Written Material             Education Topics: Risk Factor Reduction:  -Group instruction that is supported by a PowerPoint presentation. Instructor discusses the definition of a risk factor, different risk factors for pulmonary disease, and how the heart and lungs work together.   Flowsheet Row PULMONARY REHAB CHRONIC OBSTRUCTIVE PULMONARY DISEASE from 04/19/2021 in [place]   Date 04/12/21  Educator handout       Nutrition for Pulmonary Patient:  -Group instruction provided by PowerPoint slides, verbal discussion, and written materials to support subject matter. The instructor gives an explanation and review of healthy diet recommendations, which includes a discussion on weight management, recommendations for fruit and vegetable consumption, as well as protein, fluid, caffeine, fiber, sodium, sugar, and alcohol. Tips for eating when patients are short of breath are discussed. Flowsheet Row PULMONARY REHAB CHRONIC OBSTRUCTIVE PULMONARY DISEASE from 04/19/2021 in [place]  Date 04/19/21  Educator handout       Pursed Lip Breathing:  -Group instruction that is supported by demonstration and informational handouts. Instructor discusses the benefits of pursed lip and diaphragmatic breathing and detailed demonstration on how to preform both.     Oxygen Safety:  -Group instruction provided by PowerPoint, verbal discussion, and written material to support subject matter. There is an overview of "What is Oxygen" and "Why do we need it".  Instructor also reviews how to create a safe environment for oxygen use, the importance of using oxygen as prescribed, and the risks of noncompliance. There is a brief discussion on traveling with oxygen and resources the patient may utilize.   Oxygen Equipment:  -Group instruction provided by [facility] Staff utilizing handouts, written materials, and equipment demonstrations.   Signs and Symptoms:  -Group instruction provided by written material and verbal discussion to support subject matter. Warning signs and symptoms of infection, stroke, and heart attack are reviewed and when to call the physician/911 reinforced. Tips for preventing the spread of infection discussed.   Advanced Directives:  -Group instruction provided by verbal instruction and written material to support subject matter.  Instructor reviews Advanced Directive laws and proper instruction for filling out document.   Pulmonary Video:  -Group video education that reviews the importance of medication and oxygen compliance, exercise, good nutrition, pulmonary hygiene, and pursed lip and diaphragmatic breathing for the pulmonary patient.   Exercise for the Pulmonary Patient:  -Group instruction that is supported by a PowerPoint presentation. Instructor discusses benefits of exercise, core components of exercise, frequency, duration, and intensity of an exercise routine, importance of utilizing pulse oximetry during exercise, safety while exercising, and options of places to exercise outside of  rehab.   [place] CHRONIC OBSTRUCTIVE PULMONARY DISEASE from 05/10/2021 in [place]  Date 05/10/21  Educator Donnetta Simpers  [H/O]  Instruction Review Code 1- Verbalizes Understanding       Pulmonary Medications:  -Verbally interactive group education provided by instructor with focus on inhaled medications and proper administration.   Anatomy and Physiology of the Respiratory System and Intimacy:  -Group instruction provided by PowerPoint, verbal discussion, and written material to support subject matter. Instructor reviews respiratory cycle and anatomical components of the respiratory system and their functions. Instructor also reviews differences in obstructive and restrictive respiratory diseases with examples of each. Intimacy, Sex, and Sexuality differences are reviewed with a discussion on how relationships can change when diagnosed with pulmonary disease. Common sexual concerns are reviewed.   MD DAY -A group question and answer session with a medical doctor that allows participants to ask questions that relate to their pulmonary disease state.   OTHER EDUCATION -Group or individual verbal, written, or video instructions that support the educational goals of the  pulmonary rehab program. [place] from 05/03/2021 in [place]  Date 05/03/21  Educator Deanne Coffer your numbers]  Instruction Review Code 1- Verbalizes Understanding       Holiday Eating Survival Tips:  -Group instruction provided by PowerPoint slides, verbal discussion, and written materials to support subject matter. The instructor gives patients tips, tricks, and techniques to help them not only survive but enjoy the holidays despite the onslaught of food that accompanies the holidays.   Knowledge Questionnaire Score:  Knowledge Questionnaire Score - 03/30/21 1313       Knowledge Questionnaire Score   Pre Score 18/18             Core Components/Risk Factors/Patient Goals at Admission:  Personal Goals and Risk Factors at Admission - 03/30/21 1400       Core Components/Risk Factors/Patient Goals on Admission    Weight Management Obesity    Intervention Weight Management: Develop a combined nutrition and exercise program designed to reach desired caloric intake, while maintaining appropriate intake of nutrient and fiber, sodium and fats, and appropriate energy expenditure required for the weight goal.;Weight Management: Provide education and appropriate resources to help participant work on and attain dietary goals.;Weight Management/Obesity: Establish reasonable short term and long term weight goals.;Obesity: Provide education and appropriate resources to help participant work on and attain dietary goals.    Admit Weight 233 lb 11 oz (106 kg)             Core Components/Risk Factors/Patient Goals Review:   Goals and Risk Factor Review     Row Name 04/16/21 1041 05/07/21 1156           Core Components/Risk Factors/Patient Goals Review   Personal Goals Review Develop more efficient breathing techniques such as purse lipped breathing and diaphragmatic breathing and practicing  self-pacing with activity.;Increase knowledge of respiratory medications and ability to use respiratory devices properly.;Improve shortness of breath with ADL's Increase knowledge of respiratory medications and ability to use respiratory devices properly.;Improve shortness of breath with ADL's;Develop more efficient breathing techniques such as purse lipped breathing and diaphragmatic breathing and practicing self-pacing with activity.      Review Clayton [name] has attended 2 exercise sessions, too early to have improved shortness of breath and stamina. Clayton [name] is progressing well despite his chronic back issues.  He is walking 9-13 laps on the track  in 15 minutes and 4.8 mets on the nustep.      Expected Outcomes See admission goals. See admission goals.               Core Components/Risk Factors/Patient Goals at Discharge (Final Review):   Goals and Risk Factor Review - 05/07/21 1156       Core Components/Risk Factors/Patient Goals Review   Personal Goals Review Increase knowledge of respiratory medications and ability to use respiratory devices properly.;Improve shortness of breath with ADL's;Develop more efficient breathing techniques such as purse lipped breathing and diaphragmatic breathing and practicing self-pacing with activity.    Review Clayton [name] is progressing well despite his chronic back issues.  He is walking 9-13 laps on the track in 15 minutes and 4.8 mets on the nustep.    Expected Outcomes See admission goals.             ITP Comments:   Comments: ITP REVIEW Pt is making expected progress toward Pulmonary Rehab goals after completing 11 sessions. Recommend continued exercise, life style modification, education, and utilization of breathing techniques to increase stamina and strength, while also decreasing shortness of breath with exertion.

## 2021-05-24 ENCOUNTER — Encounter (HOSPITAL_COMMUNITY)
Admission: RE | Admit: 2021-05-24 | Discharge: 2021-05-24 | Disposition: A | Discharge status: Home / Self Care | Payer: 59 | Source: Ambulatory Visit | Attending: Pulmonary Disease | Admitting: Pulmonary Disease

## 2021-05-24 ENCOUNTER — Other Ambulatory Visit: Payer: Self-pay

## 2021-05-24 DIAGNOSIS — U099 Post covid-19 condition, unspecified: Secondary | ICD-10-CM

## 2021-05-24 DIAGNOSIS — M545 Low back pain, unspecified: Secondary | ICD-10-CM | POA: Diagnosis not present

## 2021-05-24 DIAGNOSIS — J1282 Pneumonia due to coronavirus disease 2019: Secondary | ICD-10-CM

## 2021-05-24 NOTE — Progress Notes (Signed)
I have reviewed a Home Exercise Prescription with [name]. Clayton [name] is currently exercising at home. He participates in water aerobic PT program 2 non-rehab days/wk for 50-60 min/day. The patient plans to exercise 2-3 non-rehab days/wk for 30-60 min/day after graduating Pulmonary Rehab. He has a Higher education careers adviser at [name] and wants to exercise on aerobic machines and lift weights that do not increase back pain. Clayton [name] and I discussed how to progress their exercise prescription. I encouraged Clayton [name] to attend [name]. I believe his exercise plans after rehab fit his lifestyle and functional capacity. The patient stated that their goals were to lose weight and maintain a exercise regimen. Clayton [name] stated that they understand the exercise prescription.  We reviewed exercise guidelines, target heart rate during exercise, RPE Scale, weather conditions, endpoints for exercise, warmup and cool down. Patient is encouraged to come to me with any questions. I will continue to follow up with the patient to assist them with progression and safety.    Sheppard Plumber, MS, ACSM-CEP 05/24/2021 11:10 AM

## 2021-05-24 NOTE — Progress Notes (Signed)
Daily Session Note  Patient Details  Name: Clayton [name] MRN: [id number] Date of Birth: 1956-03-02 Referring Provider:   April Manson Pulmonary Rehab Walk Test from 03/30/2021 in [place]  Referring Provider Dr. Erin Fulling       Encounter Date: 05/24/2021  Check In:  Session Check In - 05/24/21 1137       Check-In   Supervising physician immediately available to respond to emergencies [facility] immediately available    Physician(s) Dr. Nevada Crane    Location [facility]    Staff Present Rosebud Poles, RN, BSN;Lisa Ysidro Evert, Cathleen Fears, MS, ACSM-CEP, Exercise Physiologist    Virtual Visit No    Medication changes reported     No    Fall or balance concerns reported    No    Tobacco Cessation No Change    Warm-up and Cool-down Performed as group-led instruction    Resistance Training Performed Yes    VAD Patient? No    PAD/SET Patient? No      Pain Assessment   Currently in Pain? No/denies    Multiple Pain Sites No             Capillary Blood Glucose: No results found for this or any previous visit (from the past 24 hour(s)).   Exercise Prescription Changes - 05/24/21 1000       Home Exercise Plan   Plans to continue exercise at [facility] (comment)   Has [employer] membership   Frequency Add 2 additional days to program exercise sessions.   After graduation, plans to continue exercise at [facility]   Initial Home Exercises Provided 05/24/21             Social History   Tobacco Use  Smoking Status Never  Smokeless Tobacco Never    Goals Met:  Proper associated with RPD/PD & O2 Sat Independence with exercise equipment Improved SOB with ADL's Exercise tolerated well No report of concerns or symptoms today Strength training completed today  Goals Unmet:  Not Applicable  Comments: Service time is from 1015 to 1130.    Dr. Rodman Pickle is the pulmonary rehab medical director @ [facility].

## 2021-05-25 ENCOUNTER — Ambulatory Visit (HOSPITAL_BASED_OUTPATIENT_CLINIC_OR_DEPARTMENT_OTHER): Payer: 59 | Admitting: Physical Therapy

## 2021-05-25 ENCOUNTER — Encounter (HOSPITAL_BASED_OUTPATIENT_CLINIC_OR_DEPARTMENT_OTHER): Payer: Self-pay | Admitting: Physical Therapy

## 2021-05-25 DIAGNOSIS — R2689 Other abnormalities of gait and mobility: Secondary | ICD-10-CM

## 2021-05-25 DIAGNOSIS — M545 Low back pain, unspecified: Secondary | ICD-10-CM

## 2021-05-25 DIAGNOSIS — G8929 Other chronic pain: Secondary | ICD-10-CM

## 2021-05-25 DIAGNOSIS — R252 Cramp and spasm: Secondary | ICD-10-CM

## 2021-05-25 NOTE — Therapy (Signed)
OUTPATIENT PHYSICAL THERAPY TREATMENT NOTE   Patient Name: Clayton [name] MRN: [id number] DOB:23-Nov-1955, 65 y.o., male Today's Date: 05/25/2021  PCP: Kristen Loader, FNP REFERRING PROVIDER: Kristen Loader, FNP   PT End of Session - 05/25/21 1431     Visit Number 7    Number of Visits 12    Date for PT Re-Evaluation 06/12/21    Authorization Type [employer]    PT Start Time 0930    PT Stop Time 1012    PT Time Calculation (min) 42 min    Activity Tolerance Patient tolerated treatment well    Behavior During Therapy WFL for tasks assessed/performed             Past Medical History:  Diagnosis Date   Arthritis    Asthma    pulmonary allergies- no asthma per pt   Atrophic condition of skin    Cellulitis/abscess - trunk    Cyst    shoulder   Diabetes mellitus without complication ([place])    Hyperlipidemia    Hypertension    Hypertrophic condition of skin    Lipoma    Obesity    Pneumonia    hx child   Sleep apnea    no cpap   Stones in the urinary tract    Vitamin D deficiency    Past Surgical History:  Procedure Laterality Date   cyst removed from [place] N/A 05/23/2015   Procedure: INSERTION OF MESH;  Surgeon: [name], MD;  Location: [place];  Service: General;  Laterality: N/A;   MASS EXCISION  07/15/11   left shoulder mass    NASAL SINUS SURGERY---UVULA REMOVED  2004   Dr. Wilburn Cornelia   PROSTATE BIOPSY  9/16   [place] N/A 05/23/2015   Procedure: LAPAROSCOPIC UMBILICAL HERNIA REPAIR ;  Surgeon: [name], MD;  Location: [place];  Service: General;  Laterality: N/A;   Patient Active Problem List   Diagnosis Date Noted   OSA (obstructive sleep apnea) 01/15/2021   Chronic respiratory failure with hypoxia ([place]) 01/15/2021   Shortness of breath 01/15/2021   Physical deconditioning 01/15/2021   Malnutrition of moderate degree 05/18/2020   Uncontrolled type 2 diabetes mellitus with  hyperglycemia ([place]) 04/25/2020   Acute hypoxemic respiratory failure due to COVID-19 ([place]) 04/24/2020   Hypokalemia 04/24/2020   Hyperglycemia 04/24/2020   Acute respiratory failure with hypoxia ([place])    Suspected COVID-19 virus infection    S/P hernia repair 05/23/2015   Chronic cough 06/25/2011   LIPOMA, left shoulder. 04/04/2008   ALLERGIC RHINITIS 12/21/2007   GERD 12/21/2007   NEPHROLITHIASIS, HX OF 12/21/2007    PCP: Kristen Loader, FNP REFERRING PROVIDER: Dr Melina Schools    REFERRING DIAG: M54.50 (ICD-10-CM) - Low back pain, unspecified    THERAPY DIAG:  Chronic bilateral low back pain without sciatica - Plan: PT plan of care cert/re-cert   Cramp and spasm - Plan: PT plan of care cert/re-cert    PERTINENT HISTORY:  Lipoma of t-spine and sacral area; Arthritis  general PRECAUTIONS: None    SUBJECTIVE: Patient reports about 7/10 pain in the lower back today. Overall he feels like it is doing pretty good. He Dj'd over the weekend. He had mild pain> he had a friend do his set up.    PAIN:  Are you having pain? Yes VAS scale: 7/10 Pain location: Low back Pain orientation: Left  PAIN TYPE: aching  Pain description: intermittent  Aggravating factors: exercises last visit. Morning and nigh t Relieving factors: Rest   Today's treatment : 10/14 Pt seen for aquatic therapy today.  Treatment took place in water 3.25-4 ft in depth at the [facility] pool. Temp of water was 91.  Pt entered/exited the pool via stairs (step through pattern) independently with bilat rail.     Warm up: heel/toe walking x4 laps across pool chest deep side stepping x4 laps from shallow to deep;  Ambulation with noodle support: long strides, march cuing to use yellow noodle to stay supported. Backwards walking 2x10    Exercises; Slow march x20; squats x20; hip extension x20; hip abduction x20; Sit to stand x20;     board trunk flexion x10 lateral board rotation x10 each way seated     Seated LAQ and March x20 2lbs    Step up 2x10; lateral step up 2x10 bilateral     Standing yellow weight push pull with glute activation x20    Walking with weights 2 laps; marching with weights 2 laps; side stepping with weights 2x10      Pt requires buoyancy for support and to offload joints with strengthening exercises. Viscosity of the water is needed for resistance of strengthening; water current perturbations provides challenge to standing balance unsupported, requiring increased core activation    10/17   Pt seen for aquatic therapy today.  Treatment took place in water 3.25-4 ft in depth at the [facility] pool. Temp of water was 91.  Pt entered/exited the pool via stairs (step through pattern) independently with bilat rail.   Introduction to water. Had patient stand at different levels so she could feel the bouncy    Warm up: heel/toe walking x4 laps across pool chest deep side stepping x4 laps from shallow to deep;  Ambulation with noodle support: long strides, march cuing to use yellow noodle to stay supported.    Exercises;; hip extension x20; hip abduction x20; Sit to stand x20;     board trunk flexion x10 lateral board rotation x10 each way seated    Seated LAQ and March x20 2lbs    Rainbow weight press seated 2x20      Pt requires buoyancy for support and to offload joints with strengthening exercises. Viscosity of the water is needed for resistance of strengthening; water current perturbations provides challenge to standing balance unsupported, requiring increased core activation  10/21 Pt seen for aquatic therapy today.  Treatment took place in water 3.25-4 ft in depth at the [facility] pool. Temp of water was 91.  Pt entered/exited the pool via stairs (step through pattern) independently with bilat rail.     Warm up: heel/toe walking x4 laps across pool chest deep side stepping x4 laps from shallow to deep;  Ambulation with noodle support:  long strides, march cuing to use yellow noodle to stay supported. Backwards walking 2x10    Exercises; Slow march x20; squats x20; hip extension x20; hip abduction x20; Sit to stand x20;     board trunk flexion x10 lateral board rotation x10 each way seated    Seated LAQ and March x20 2lbs     Standing yellow weight push pull with glute activation x20    Walking with weights 2 laps; marching with weights 2 laps; side stepping with weights 2x10   Floating: push and pull and side to side perturbations  Manual therapy: manual trigger point release to uppr/gluteal and QL area  Pt requires buoyancy for support and to offload joints with strengthening exercises. Viscosity of the water is needed for resistance of strengthening; water current perturbations provides challenge to standing balance unsupported, requiring increased core activation     REHAB POTENTIAL: Good   CLINICAL DECISION MAKING: Evolving/moderate complexity decreasing ability to stand and walk    EVALUATION COMPLEXITY: Moderate   PATIENT EDUCATION:  Education details: benefits of water  Person educated: Patient Education method: Explanation; demonstration  Education comprehension: verbalized understanding       CLINICAL IMPRESSION: Patient had a large trigger point today in his gluteal/QL area. Patient performed manual therapy to that area. He reported improved pain after floating and the manual therapy. He was advised to put pressure on this trigger point over the weekend. We will re-assess the patients POC next week.    Abnormal gait, decreased activity tolerance, decreased endurance, decreased mobility, difficulty walking, decreased ROM, decreased strength, decreased safety awareness, increased fascial restrictions, impaired perceived functional ability, impaired UE functional use, obesity, and pain   cleaning, driving, occupation, yard work, and shopping   Past/current experiences and 1-2 comorbidities:  general arthritis; lipomas around the spien but no on the spine    REHAB POTENTIAL: Good   CLINICAL DECISION MAKING: Evolving/moderate complexity decreasing ability to stand and walk    EVALUATION COMPLEXITY: Moderate     GOALS: Goals reviewed with patient? Yes   SHORT TERM GOALS:   STG Name Target Date Goal status  1 Patient will perform full lumbar rotation without pain  Baseline:  05/22/2021 INITIAL  2 Patient will increase bilateral hip strength to 4+/5  Baseline:  05/22/2021 INITIAL  3 Patient will be independent with basic HEP  Baseline: 05/22/2021 INITIAL  LONG TERM GOALS:    LTG Name Target Date Goal status  1 Patient will stand for 1 hour without increased pain  Baseline: 06/12/2021 INITIAL  2 Patient will walk around the grocery store without increased pain  Baseline: 06/12/2021 INITIAL  3 Patient will be independent with a complete HEP in the pool and on land  Baseline: 06/12/2021 INITIAL  PT FREQUENCY: 2x/week   PT DURATION: 6 weeks   PLANNED INTERVENTIONS: Therapeutic exercises, Therapeutic activity, Neuro Muscular re-education, Balance training, Gait training, Patient/Family education, Joint mobilization, Stair training, Aquatic Therapy, Dry Needling, Electrical stimulation, Cryotherapy, Moist heat, Taping, Traction, Ultrasound, and Manual therapy   PLAN FOR NEXT SESSION: If in the pool work on standing exercises as neutral as possible; hip strengthening and core strengthening; on land consider TPDN and manual trigger point release; Patient did not tolerate supine well. Consider seated UE or LE core strengthening exercises     Carney Living PT DPT  05/25/2021, 2:33 PM

## 2021-05-28 ENCOUNTER — Other Ambulatory Visit: Payer: Self-pay

## 2021-05-28 ENCOUNTER — Encounter (HOSPITAL_BASED_OUTPATIENT_CLINIC_OR_DEPARTMENT_OTHER): Payer: 59 | Admitting: Physical Therapy

## 2021-05-28 DIAGNOSIS — M545 Low back pain, unspecified: Secondary | ICD-10-CM

## 2021-05-28 DIAGNOSIS — R2689 Other abnormalities of gait and mobility: Secondary | ICD-10-CM

## 2021-05-28 DIAGNOSIS — G8929 Other chronic pain: Secondary | ICD-10-CM

## 2021-05-28 DIAGNOSIS — R252 Cramp and spasm: Secondary | ICD-10-CM

## 2021-05-28 NOTE — Therapy (Signed)
OUTPATIENT PHYSICAL THERAPY TREATMENT NOTE/ Progress Note    Patient Name: Clayton [name] MRN: [id number] DOB:Jan 19, 1956, 65 y.o., male Today's Date: 05/28/2021  PCP:     PT End of Session - 05/28/21 0854     Visit Number 8    Number of Visits 12    Date for PT Re-Evaluation 06/12/21    Authorization Type [employer]    PT Start Time [phone]   Patient 8 min late   PT Stop Time 0931    PT Time Calculation (min) 38 min    Activity Tolerance Patient tolerated treatment well    Behavior During Therapy WFL for tasks assessed/performed             Past Medical History:  Diagnosis Date   Arthritis    Asthma    pulmonary allergies- no asthma per pt   Atrophic condition of skin    Cellulitis/abscess - trunk    Cyst    shoulder   Diabetes mellitus without complication ([place])    Hyperlipidemia    Hypertension    Hypertrophic condition of skin    Lipoma    Obesity    Pneumonia    hx child   Sleep apnea    no cpap   Stones in the urinary tract    Vitamin D deficiency    Past Surgical History:  Procedure Laterality Date   cyst removed from [place] N/A 05/23/2015   Procedure: INSERTION OF MESH;  Surgeon: [name], MD;  Location: [place];  Service: General;  Laterality: N/A;   MASS EXCISION  07/15/11   left shoulder mass    NASAL SINUS SURGERY---UVULA REMOVED  2004   Dr. Wilburn Cornelia   PROSTATE BIOPSY  9/16   [place] N/A 05/23/2015   Procedure: LAPAROSCOPIC UMBILICAL HERNIA REPAIR ;  Surgeon: [name], MD;  Location: [place];  Service: General;  Laterality: N/A;   Patient Active Problem List   Diagnosis Date Noted   OSA (obstructive sleep apnea) 01/15/2021   Chronic respiratory failure with hypoxia ([place]) 01/15/2021   Shortness of breath 01/15/2021   Physical deconditioning 01/15/2021   Malnutrition of moderate degree 05/18/2020   Uncontrolled type 2 diabetes mellitus with hyperglycemia ([place])  04/25/2020   Acute hypoxemic respiratory failure due to COVID-19 ([place]) 04/24/2020   Hypokalemia 04/24/2020   Hyperglycemia 04/24/2020   Acute respiratory failure with hypoxia ([place])    Suspected COVID-19 virus infection    S/P hernia repair 05/23/2015   Chronic cough 06/25/2011   LIPOMA, left shoulder. 04/04/2008   ALLERGIC RHINITIS 12/21/2007   GERD 12/21/2007   NEPHROLITHIASIS, HX OF 12/21/2007    PCP: Kristen Loader, FNP REFERRING PROVIDER: Dr Melina Schools     Progress Note Reporting Period 04/30/2021 to 05/28/2021  See note below for Objective Data and Assessment of Progress/Goals.      SUBJECTIVE:  SUBJECTIVE STATEMENT: Patient reports he is having some pain this morning. He has his last week of pulmonary rehab. He did a project where he had rto reach down over the weekend. Overall he feels like he is a little stronger.    PERTINENT HISTORY:  Lipoma of t-spine and sacral area; Arthritis  general;    PAIN:  Are you having pain? Yes VAS scale:  8/10 Pain location: Low Back  Pain orientation: Right and Left  L> R  PAIN TYPE: sharp Pain description: constant  Aggravating factors: reaching overhead; needs to lean forward when doing tasks like shopping Relieving factors: lying on the left side    PRECAUTIONS: None   WEIGHT BEARING RESTRICTIONS No   FALLS:  Has patient fallen in last 6 months? No, Number of falls:    LIVING ENVIRONMENT: Lives with: lives with their family Lives in: House/apartment Stairs: Yes; External: 10 steps; Rail on   going up Internal 14 steps into the house  Has following equipment at home: None   OCCUPATION: on disability    PLOF: Independent   PATIENT GOALS :  to have less pain      LUMBARAROM/PROM   A/PROM A/PROM  05/01/2021  Flexion 80 with  no pain    Extension Not assessed   Right lateral flexion    Left lateral flexion    Right rotation Mild pain   Left rotation Mild pain     (Blank rows = not tested)   LE AROM/PROM:   PROM Right 05/01/2021 Left 05/01/2021  Hip flexion No pain  Mild pain   Hip extension      Hip abduction      Hip adduction      Hip internal rotation No pain  Mild pain   Hip external rotation      Knee flexion      Knee extension      Ankle dorsiflexion      Ankle plantarflexion      Ankle inversion      Ankle eversion       (Blank rows = not tested)   LE MMT:   MMT Right 05/01/2021 Left 05/01/2021  Hip flexion 4/5 New: 4+/5 4/5 New  4+/5   Hip extension      Hip abduction 4+/5 New :  5/5 4+/5 5/5   Hip adduction 5/5 5/5  Hip internal rotation      Hip external rotation      Knee flexion      Knee extension      Ankle dorsiflexion      Ankle plantarflexion      Ankle inversion      Ankle eversion       (Blank rows = not tested)   LUMBAR SPECIAL TESTS:      SPINAL SEGMENTAL MOBILITY ASSESSMENT:  Not assessed 2nd spondy and inability to lie prone      GAIT: Distance walked:    Comments: mild improvement in lateral motion with ambulation       PAIN:  Are you having pain? Yes VAS scale: 8/10  Pain location:  left lower back  Pain orientation: Left  PAIN TYPE: aching Pain description: constant  Aggravating factors: different positions  Relieving factors: rest    TODAY'S TREATMENT                    - assessed strength and mobility; reviewed results with the patient  Exercises: reviewed set-up of machines Cybex row 13.5  lbs 3x10  Triceps ext 3x10 10 lbs  Biceps curls 3x10   Manual: trigger point release to the gluteal reviewed self with the thera-cane         PATIENT EDUCATION:  Education details: reviewed proper set u of machines  Person educated: Patient Education method: Adult nurse , handout  Education comprehension: verbalized  understanding, returned demonstration, verbal cues required, tactile cues required, and needs further education     HOME EXERCISE PROGRAM: Access Code: [email or web address] URL: [email or web address] Date: 05/01/2021 Prepared by: Carolyne Littles   Exercises Supine Piriformis Stretch with Foot on Ground - 1 x daily - 7 x weekly - 3 sets - 3 reps - 20 hold Seated Hamstring Stretch - 1 x daily - 7 x weekly - 3 sets - 3 reps - 20 hold Standing Glute Med Mobilization with Small Ball on Wall - 1 x daily - 7 x weekly - 1 sets - 1 reps - 2 min hold     ASSESSMENT:   CLINICAL IMPRESSION: Patient is makingg some progress. His lumbar flexion has improved. His rotation is less painful. His hip flexion strength has improved. He feels like overall he is getting stronger. He will be ending pulmonary rehab this week. At that time he will transition more into his gym based program. He would like to continue with PT for a few more weeks to go through the transition into gym based exercises. Therapy reviewed UE gym exercises today. He tolerated well.     Abnormal gait, decreased activity tolerance, decreased endurance, decreased mobility, difficulty walking, decreased ROM, decreased strength, decreased safety awareness, increased fascial restrictions, impaired perceived functional ability, impaired UE functional use, obesity, and pain   cleaning, driving, occupation, yard work, and shopping   Past/current experiences and 1-2 comorbidities: general arthritis; lipomas around the spien but no on the spine         Carney Living PT DPT  05/28/2021, 4:36 PM

## 2021-05-29 ENCOUNTER — Encounter (HOSPITAL_COMMUNITY)
Admission: RE | Admit: 2021-05-29 | Discharge: 2021-05-29 | Disposition: A | Discharge status: Home / Self Care | Payer: 59 | Source: Ambulatory Visit | Attending: Pulmonary Disease | Admitting: Pulmonary Disease

## 2021-05-29 DIAGNOSIS — U099 Post covid-19 condition, unspecified: Secondary | ICD-10-CM

## 2021-05-29 DIAGNOSIS — M545 Low back pain, unspecified: Secondary | ICD-10-CM | POA: Diagnosis not present

## 2021-05-29 DIAGNOSIS — J1282 Pneumonia due to coronavirus disease 2019: Secondary | ICD-10-CM

## 2021-05-29 NOTE — Progress Notes (Signed)
Daily Session Note  Patient Details  Name: Clayton [name] MRN: [id number] Date of Birth: Jan 08, 1956 Referring Provider:   April Manson Pulmonary Rehab Walk Test from 03/30/2021 in [place]  Referring Provider Dr. Erin Fulling       Encounter Date: 05/29/2021  Check In:  Session Check In - 05/29/21 1106       Check-In   Supervising physician immediately available to respond to emergencies [facility] immediately available    Physician(s) Dr. Karleen Hampshire    Location [facility]    Staff Present Rosebud Poles, RN, Quentin Ore, MS, ACSM-CEP, Exercise Physiologist;Lisa Ysidro Evert, RN    Virtual Visit No    Medication changes reported     No    Fall or balance concerns reported    No    Tobacco Cessation No Change    Warm-up and Cool-down Performed as group-led instruction    Resistance Training Performed Yes    VAD Patient? No    PAD/SET Patient? No      Pain Assessment   Currently in Pain? No/denies    Multiple Pain Sites No             Capillary Blood Glucose: No results found for this or any previous visit (from the past 24 hour(s)).    Social History   Tobacco Use  Smoking Status Never  Smokeless Tobacco Never    Goals Met:  Proper associated with RPD/PD & O2 Sat Independence with exercise equipment Exercise tolerated well No report of concerns or symptoms today Strength training completed today  Goals Unmet:  Not Applicable  Comments: Service time is from 1017 to 1124.    Dr. Rodman Pickle is Medical Director for Pulmonary Rehab at [facility].

## 2021-05-31 ENCOUNTER — Encounter (HOSPITAL_COMMUNITY)
Admission: RE | Admit: 2021-05-31 | Discharge: 2021-05-31 | Disposition: A | Discharge status: Home / Self Care | Payer: 59 | Source: Ambulatory Visit | Attending: Pulmonary Disease | Admitting: Pulmonary Disease

## 2021-05-31 ENCOUNTER — Other Ambulatory Visit: Payer: Self-pay

## 2021-05-31 VITALS — Wt 237.9 lb

## 2021-05-31 DIAGNOSIS — M545 Low back pain, unspecified: Secondary | ICD-10-CM | POA: Diagnosis not present

## 2021-05-31 DIAGNOSIS — U099 Post covid-19 condition, unspecified: Secondary | ICD-10-CM

## 2021-05-31 DIAGNOSIS — J1282 Pneumonia due to coronavirus disease 2019: Secondary | ICD-10-CM

## 2021-05-31 NOTE — Progress Notes (Signed)
Discharge Progress Report  Patient Details  Name: Clayton [name] MRN: [id number] Date of Birth: 01/28/1956 Referring Provider:   [facility] Walk Test from 03/30/2021 in [place]  Referring Provider Dr. Erin Fulling        Number of Visits: 14  Reason for Discharge:  Patient has met program and personal goals.  Smoking History:  Social History   Tobacco Use  Smoking Status Never  Smokeless Tobacco Never    Diagnosis:  Post covid-19 condition, unspecified  Pneumonia due to coronavirus disease 2019 (CODE)  ADL [facility]:  Pulmonary Assessment Scores     Row Name 03/30/21 1205 05/29/21 1529 05/31/21 1200     ADL [facility]   ADL Phase Entry Exit Exit   SOB Score total 54 55 --     CAT Score   CAT Score 22 - pre 17 --     mMRC Score   mMRC Score 4 -- 4            Initial Exercise Prescription:  Initial Exercise Prescription - 03/30/21 1300       Date of Initial Exercise RX and Referring Provider   Date 03/30/21    Referring Provider Dr. Erin Fulling    Expected Discharge Date 05/31/21      NuStep   Level 2    SPM 80    Minutes 15      Track   Minutes 15      Prescription Details   Frequency (times per week) 2    Duration Progress to 30 minutes of continuous aerobic without signs/symptoms of physical distress      Intensity   THRR 40-80% of Max Heartrate 63-126    Ratings of Perceived Exertion 11-13    Perceived Dyspnea 0-4      Progression   Progression Continue to progress workloads to maintain intensity without signs/symptoms of physical distress.      Resistance Training   Training Prescription Yes    Weight Blue bands    Reps 10-15             Discharge Exercise Prescription (Final Exercise Prescription Changes):  Exercise Prescription Changes - 05/24/21 1000       Home Exercise Plan   Plans to continue exercise at [facility] (comment)   Has [employer] membership   Frequency Add 2  additional days to program exercise sessions.   After graduation, plans to continue exercise at [facility]   Initial Home Exercises Provided 05/24/21             Functional Capacity:  Bruno Name 03/30/21 1328 05/31/21 1146       6 Minute Walk   Phase Initial Discharge    Distance 1460 feet 1680 feet    Distance % Change -- 15.07 %    Distance Feet Change -- 220 ft    Walk Time 6 minutes 6 minutes    # of Rest Breaks 0 0    MPH 2.77 3.18    METS 3.3 3.65    RPE 14 13    Perceived Dyspnea  1 1    VO2 Peak 11.54 12.79    Symptoms Yes (comment) Yes (comment)    Comments Back pain 7/10 7/10 back pain    Resting HR 77 bpm 76 bpm    Resting BP 140/68 136/70    Resting Oxygen Saturation  95 % 96 %    Exercise Oxygen Saturation  during 6 min walk 90 % 90 %    Max Ex. HR 112 bpm 112 bpm    Max Ex. BP 150/70 150/74    2 Minute Post BP 142/70 144/70      Interval HR   1 Minute HR 102 28    2 Minute HR 105 102    3 Minute HR 112 92    4 Minute HR 104 106    5 Minute HR 107 104    6 Minute HR 111 112    2 Minute Post HR 74 84    Interval Heart Rate? Yes Yes      Interval Oxygen   Interval Oxygen? Yes --    Baseline Oxygen Saturation % 95 % 96 %    1 Minute Oxygen Saturation % 91 % 92 %    1 Minute Liters of Oxygen 0 L 0 L    2 Minute Oxygen Saturation % 91 % 90 %    2 Minute Liters of Oxygen 0 L 0 L    3 Minute Oxygen Saturation % 90 % 91 %    3 Minute Liters of Oxygen 0 L 0 L    4 Minute Oxygen Saturation % 91 % 90 %    4 Minute Liters of Oxygen 0 L 0 L    5 Minute Oxygen Saturation % 90 % 90 %    5 Minute Liters of Oxygen 0 L 0 L    6 Minute Oxygen Saturation % 91 % 90 %    6 Minute Liters of Oxygen 0 L 0 L    2 Minute Post Oxygen Saturation % 95 % 96 %    2 Minute Post Liters of Oxygen 74 L 0 L             Psychological, QOL, Others - Outcomes: PHQ 2/9: Depression screen [facility] 2/9 05/31/2021 03/30/2021 08/25/2015 08/16/2015 08/07/2015  Decreased  Interest 2 2 0 0 0  Down, Depressed, Hopeless 1 2 0 0 0  PHQ - 2 Score 3 4 0 0 0  Altered sleeping 2 3 - - -  Tired, decreased energy 2 3 - - -  Change in appetite 2 0 - - -  Feeling bad or failure about yourself  1 0 - - -  Trouble concentrating 3 2 - - -  Moving slowly or fidgety/restless 0 0 - - -  Suicidal thoughts 0 0 - - -  PHQ-9 Score 13 12 - - -  Difficult doing work/chores Somewhat difficult Very difficult - - -    Quality of Life:   Personal Goals: Goals established at orientation with interventions provided to work toward goal.  Personal Goals and Risk Factors at Admission - 03/30/21 1400       Core Components/Risk Factors/Patient Goals on Admission    Weight Management Obesity    Intervention Weight Management: Develop a combined nutrition and exercise program designed to reach desired caloric intake, while maintaining appropriate intake of nutrient and fiber, sodium and fats, and appropriate energy expenditure required for the weight goal.;Weight Management: Provide education and appropriate resources to help participant work on and attain dietary goals.;Weight Management/Obesity: Establish reasonable short term and long term weight goals.;Obesity: Provide education and appropriate resources to help participant work on and attain dietary goals.    Admit Weight 233 lb 11 oz (106 kg)              Personal Goals Discharge:  Goals and Risk Factor  Review     Row Name 04/16/21 1041 05/07/21 1156           Core Components/Risk Factors/Patient Goals Review   Personal Goals Review Develop more efficient breathing techniques such as purse lipped breathing and diaphragmatic breathing and practicing self-pacing with activity.;Increase knowledge of respiratory medications and ability to use respiratory devices properly.;Improve shortness of breath with ADL's Increase knowledge of respiratory medications and ability to use respiratory devices properly.;Improve shortness of  breath with ADL's;Develop more efficient breathing techniques such as purse lipped breathing and diaphragmatic breathing and practicing self-pacing with activity.      Review Lane Hacker has attended 2 exercise sessions, too early to have improved shortness of breath and stamina. Lane Hacker is progressing well despite his chronic back issues.  He is walking 9-13 laps on the track in 15 minutes and 4.8 mets on the nustep.      Expected Outcomes See admission goals. See admission goals.               Exercise Goals and Review:  Exercise Goals     Row Name 03/30/21 1152 04/12/21 1645 05/08/21 0713         Exercise Goals   Increase Physical Activity Yes Yes Yes     Intervention Develop an individualized exercise prescription for aerobic and resistive training based on initial evaluation findings, risk stratification, comorbidities and participant's personal goals.;Provide advice, education, support and counseling about physical activity/exercise needs. Develop an individualized exercise prescription for aerobic and resistive training based on initial evaluation findings, risk stratification, comorbidities and participant's personal goals.;Provide advice, education, support and counseling about physical activity/exercise needs. Develop an individualized exercise prescription for aerobic and resistive training based on initial evaluation findings, risk stratification, comorbidities and participant's personal goals.;Provide advice, education, support and counseling about physical activity/exercise needs.     Expected Outcomes Short Term: Attend rehab on a regular basis to increase amount of physical activity.;Long Term: Add in home exercise to make exercise part of routine and to increase amount of physical activity.;Long Term: Exercising regularly at least 3-5 days a week. Short Term: Attend rehab on a regular basis to increase amount of physical activity.;Long Term: Add in home exercise to make exercise part of  routine and to increase amount of physical activity.;Long Term: Exercising regularly at least 3-5 days a week. Short Term: Attend rehab on a regular basis to increase amount of physical activity.;Long Term: Add in home exercise to make exercise part of routine and to increase amount of physical activity.;Long Term: Exercising regularly at least 3-5 days a week.     Increase Strength and Stamina Yes Yes Yes     Intervention Provide advice, education, support and counseling about physical activity/exercise needs.;Develop an individualized exercise prescription for aerobic and resistive training based on initial evaluation findings, risk stratification, comorbidities and participant's personal goals. Provide advice, education, support and counseling about physical activity/exercise needs.;Develop an individualized exercise prescription for aerobic and resistive training based on initial evaluation findings, risk stratification, comorbidities and participant's personal goals. Provide advice, education, support and counseling about physical activity/exercise needs.;Develop an individualized exercise prescription for aerobic and resistive training based on initial evaluation findings, risk stratification, comorbidities and participant's personal goals.     Expected Outcomes Short Term: Increase workloads from initial exercise prescription for resistance, speed, and METs.;Short Term: Perform resistance training exercises routinely during rehab and add in resistance training at home;Long Term: Improve cardiorespiratory fitness, muscular endurance and strength as measured by increased METs and functional capacity (  6MWT) Short Term: Increase workloads from initial exercise prescription for resistance, speed, and METs.;Short Term: Perform resistance training exercises routinely during rehab and add in resistance training at home;Long Term: Improve cardiorespiratory fitness, muscular endurance and strength as measured by  increased METs and functional capacity (6MWT) Short Term: Increase workloads from initial exercise prescription for resistance, speed, and METs.;Short Term: Perform resistance training exercises routinely during rehab and add in resistance training at home;Long Term: Improve cardiorespiratory fitness, muscular endurance and strength as measured by increased METs and functional capacity (6MWT)     Able to understand and use rate of perceived exertion (RPE) scale Yes Yes Yes     Intervention Provide education and explanation on how to use RPE scale Provide education and explanation on how to use RPE scale Provide education and explanation on how to use RPE scale     Expected Outcomes Short Term: Able to use RPE daily in rehab to express subjective intensity level;Long Term:  Able to use RPE to guide intensity level when exercising independently Short Term: Able to use RPE daily in rehab to express subjective intensity level;Long Term:  Able to use RPE to guide intensity level when exercising independently Short Term: Able to use RPE daily in rehab to express subjective intensity level;Long Term:  Able to use RPE to guide intensity level when exercising independently     Able to understand and use Dyspnea scale Yes Yes Yes     Intervention Provide education and explanation on how to use Dyspnea scale Provide education and explanation on how to use Dyspnea scale Provide education and explanation on how to use Dyspnea scale     Expected Outcomes Short Term: Able to use Dyspnea scale daily in rehab to express subjective sense of shortness of breath during exertion;Long Term: Able to use Dyspnea scale to guide intensity level when exercising independently Short Term: Able to use Dyspnea scale daily in rehab to express subjective sense of shortness of breath during exertion;Long Term: Able to use Dyspnea scale to guide intensity level when exercising independently Short Term: Able to use Dyspnea scale daily in rehab  to express subjective sense of shortness of breath during exertion;Long Term: Able to use Dyspnea scale to guide intensity level when exercising independently     Knowledge and understanding of Target Heart Rate Range (THRR) Yes Yes Yes     Intervention Provide education and explanation of THRR including how the numbers were predicted and where they are located for reference Provide education and explanation of THRR including how the numbers were predicted and where they are located for reference Provide education and explanation of THRR including how the numbers were predicted and where they are located for reference     Expected Outcomes Short Term: Able to state/look up THRR;Short Term: Able to use daily as guideline for intensity in rehab;Long Term: Able to use THRR to govern intensity when exercising independently Short Term: Able to state/look up THRR;Short Term: Able to use daily as guideline for intensity in rehab;Long Term: Able to use THRR to govern intensity when exercising independently Short Term: Able to state/look up THRR;Short Term: Able to use daily as guideline for intensity in rehab;Long Term: Able to use THRR to govern intensity when exercising independently     Able to check pulse independently Yes Yes Yes     Intervention Provide education and demonstration on how to check pulse in carotid and radial arteries.;Review the importance of being able to check your own pulse for  safety during independent exercise Provide education and demonstration on how to check pulse in carotid and radial arteries.;Review the importance of being able to check your own pulse for safety during independent exercise Provide education and demonstration on how to check pulse in carotid and radial arteries.;Review the importance of being able to check your own pulse for safety during independent exercise     Expected Outcomes Short Term: Able to explain why pulse checking is important during independent  exercise;Long Term: Able to check pulse independently and accurately Short Term: Able to explain why pulse checking is important during independent exercise;Long Term: Able to check pulse independently and accurately Short Term: Able to explain why pulse checking is important during independent exercise;Long Term: Able to check pulse independently and accurately     Understanding of Exercise Prescription Yes Yes Yes     Intervention Provide education, explanation, and written materials on patient's individual exercise prescription Provide education, explanation, and written materials on patient's individual exercise prescription Provide education, explanation, and written materials on patient's individual exercise prescription     Expected Outcomes Short Term: Able to explain program exercise prescription;Long Term: Able to explain home exercise prescription to exercise independently Short Term: Able to explain program exercise prescription;Long Term: Able to explain home exercise prescription to exercise independently Short Term: Able to explain program exercise prescription;Long Term: Able to explain home exercise prescription to exercise independently              Exercise Goals Re-Evaluation:  Exercise Goals Re-Evaluation     Row Name 04/12/21 1645 05/08/21 0713           Exercise Goal Re-Evaluation   Exercise Goals Review Increase Physical Activity;Increase Strength and Stamina;Able to understand and use rate of perceived exertion (RPE) scale;Able to understand and use Dyspnea scale;Knowledge and understanding of Target Heart Rate Range (THRR);Understanding of Exercise Prescription Increase Physical Activity;Increase Strength and Stamina;Able to understand and use rate of perceived exertion (RPE) scale;Able to understand and use Dyspnea scale;Knowledge and understanding of Target Heart Rate Range (THRR);Understanding of Exercise Prescription      Comments Sammie has completed 2 exercises  sessions. He has tolerated exercise fair on the Nustep and track. He averaged 2.2 METs at level 2 on the Nustep and 5 laps on the track. 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 I told him I would adjust his exercise prescription as necessary. He is motivated to exercise. He completes all of the warmup exercises standing up and some of the cooldown exercises sitting down because of his chronic back pain. Will continue to monitor and progress as able. Filip has completed 8 exercises sessions. He has been sent home a couple of times due to tardiness and procedures. He exercises on the Nustep and track for 15 min. Bruk tolerates exercise well and has progressed on the Nustep and track. He averages 2.9 METs at level 4 on the Nustep and 2.51 METs on the track. He performs the warm up standing and cooldown seated due to his chronic back pain. He has mentioned to me that his doctor has referred him to an water aerobics program for his back pain. He began this program last week. Will continue to monitor and progress as able.      Expected Outcomes Through exercise at rehab and home, the pt will decrease SOB with daily activities and feel confident in carrying out an exercise regimen at home. Through exercise at  rehab and home, the pt will decrease shortness of breath with daily activities and feel confident in carrying out an exercise regimen at home.               Nutrition & Weight - Outcomes:  Pre Biometrics - 03/30/21 1351       Pre Biometrics   Height $Remov'5\' 7"'[id number]$  (1.702 m)    Weight 106 kg    BMI (Calculated) 36.59    Grip Strength 20 kg             Post Biometrics - 05/31/21 1146        Post  Biometrics   Weight 107.9 kg    Grip Strength 30 kg             Nutrition:  Nutrition Therapy & Goals - 04/05/21 1205       Nutrition Therapy   Diet General Healthful      Personal Nutrition Goals   Nutrition Goal Pt to identify food  quantities necessary to achieve weight loss of 6-24 lb at graduation from pulmonary rehab.    Personal Goal #2 Pt to build a healthy plate including vegetables, fruits, whole grains, and low-fat dairy products in a heart healthy meal plan.    Personal Goal #3 Pt to eat out/eat fast food less than 2 times per week      Intervention Plan   Intervention Prescribe, educate and counsel regarding individualized specific dietary modifications aiming towards targeted core components such as weight, hypertension, lipid management, diabetes, heart failure and other comorbidities.;Nutrition handout(s) given to patient.    Expected Outcomes Long Term Goal: Adherence to prescribed nutrition plan.;Short Term Goal: A plan has been developed with personal nutrition goals set during dietitian appointment.             Nutrition Discharge:   Education Questionnaire Score:  Knowledge Questionnaire Score - 05/29/21 1527       Knowledge Questionnaire Score   Post Score 16/18             Goals reviewed with patient; copy given to patient.

## 2021-05-31 NOTE — Progress Notes (Signed)
Daily Session Note  Patient Details  Name: Clayton [name] MRN: [id number] Date of Birth: 12-Nov-1955 Referring Provider:   [facility] Walk Test from 03/30/2021 in [place]  Referring Provider Dr. Erin Fulling       Encounter Date: 05/31/2021  Check In:  Session Check In - 05/31/21 1059       Check-In   Supervising physician immediately available to respond to emergencies [facility] immediately available    Physician(s) Dr. Verlon Au    Location [facility]    Staff Present Rosebud Poles, RN, Quentin Ore, MS, ACSM-CEP, Exercise Physiologist;Lisa Ysidro Evert, RN    Virtual Visit No    Medication changes reported     No    Fall or balance concerns reported    No    Tobacco Cessation No Change    Warm-up and Cool-down Performed as group-led instruction    Resistance Training Performed Yes    VAD Patient? No    PAD/SET Patient? No      Pain Assessment   Currently in Pain? Yes    Pain Score 7     Pain Location Back    Pain Orientation Lower    Pain Descriptors / Indicators Constant    Pain Type Chronic pain    Pain Onset More than a month ago    Pain Frequency Constant    Multiple Pain Sites No             Capillary Blood Glucose: No results found for this or any previous visit (from the past 24 hour(s)).    Social History   Tobacco Use  Smoking Status Never  Smokeless Tobacco Never    Goals Met:  Proper associated with RPD/PD & O2 Sat Independence with exercise equipment Exercise tolerated well No report of concerns or symptoms today Strength training completed today  Goals Unmet:  Not Applicable  Comments: Service time is from 1015 to 1125. Completed 6 MWT.    Dr. Rodman Pickle is Medical Director for Pulmonary Rehab at [facility].

## 2021-06-01 ENCOUNTER — Ambulatory Visit (HOSPITAL_BASED_OUTPATIENT_CLINIC_OR_DEPARTMENT_OTHER): Payer: 59 | Admitting: Physical Therapy

## 2021-06-01 DIAGNOSIS — R2689 Other abnormalities of gait and mobility: Secondary | ICD-10-CM

## 2021-06-01 DIAGNOSIS — M545 Low back pain, unspecified: Secondary | ICD-10-CM

## 2021-06-01 DIAGNOSIS — R252 Cramp and spasm: Secondary | ICD-10-CM

## 2021-06-01 DIAGNOSIS — G8929 Other chronic pain: Secondary | ICD-10-CM

## 2021-06-01 NOTE — Therapy (Signed)
OUTPATIENT PHYSICAL THERAPY TREATMENT NOTE   Patient Name: Clayton [name] MRN: [id number] DOB:[birth year], 65 y.o., male Today's Date: 06/01/2021  PCP: Kristen Loader, FNP REFERRING PROVIDER: Kristen Loader, FNP    Past Medical History:  Diagnosis Date   Arthritis    Asthma    pulmonary allergies- no asthma per pt   Atrophic condition of skin    Cellulitis/abscess - trunk    Cyst    shoulder   Diabetes mellitus without complication ([place])    Hyperlipidemia    Hypertension    Hypertrophic condition of skin    Lipoma    Obesity    Pneumonia    hx child   Sleep apnea    no cpap   Stones in the urinary tract    Vitamin D deficiency    Past Surgical History:  Procedure Laterality Date   cyst removed from [place] N/A 05/23/2015   Procedure: INSERTION OF MESH;  Surgeon: [name], MD;  Location: [facility];  Service: General;  Laterality: N/A;   MASS EXCISION  07/15/11   left shoulder mass    NASAL SINUS SURGERY---UVULA REMOVED  2004   Dr. Wilburn Cornelia   PROSTATE BIOPSY  9/16   [place] N/A 05/23/2015   Procedure: [place] ;  Surgeon: [name], MD;  Location: [facility];  Service: General;  Laterality: N/A;   Patient Active Problem List   Diagnosis Date Noted   OSA (obstructive sleep apnea) 01/15/2021   Chronic respiratory failure with hypoxia ([place]) 01/15/2021   Shortness of breath 01/15/2021   Physical deconditioning 01/15/2021   Malnutrition of moderate degree 05/18/2020   Uncontrolled type 2 diabetes mellitus with hyperglycemia ([place]) 04/25/2020   Acute hypoxemic respiratory failure due to COVID-19 ([place]) 04/24/2020   Hypokalemia 04/24/2020   Hyperglycemia 04/24/2020   Acute respiratory failure with hypoxia ([place])    Suspected COVID-19 virus infection    S/P hernia repair 05/23/2015   Chronic cough 06/25/2011   LIPOMA, left shoulder. 04/04/2008   ALLERGIC RHINITIS  12/21/2007   GERD 12/21/2007   NEPHROLITHIASIS, HX OF 12/21/2007   PCP: Kristen Loader, FNP REFERRING PROVIDER: Dr Melina Schools    REFERRING DIAG: M54.50 (ICD-10-CM) - Low back pain, unspecified    THERAPY DIAG:  Chronic bilateral low back pain without sciatica - Plan: PT plan of care cert/re-cert   Cramp and spasm - Plan: PT plan of care cert/re-cert    PERTINENT HISTORY:  Lipoma of t-spine and sacral area; Arthritis  general PRECAUTIONS: None    SUBJECTIVE: Patient did a lot of shoveling yesterday. He feels like his back is more sore   PAIN:  Are you having pain? Yes VAS scale: 8/10 Pain location: Low back Pain orientation: Left  PAIN TYPE: aching Pain description: intermittent  Aggravating factors: exercises last visit. Morning and nigh t Relieving factors: Rest   Today's treatment :    10/17   Pt seen for aquatic therapy today.  Treatment took place in water 3.25-4 ft in depth at the [facility] pool. Temp of water was 91.  Pt entered/exited the pool via stairs (step through pattern) independently with bilat rail.   Introduction to water. Had patient stand at different levels so she could feel the bouncy    Warm up: heel/toe walking x4 laps across pool chest deep side stepping x4 laps from shallow to deep;  Ambulation with noodle support: long strides,  march cuing to use yellow noodle to stay supported.    Exercises;; hip extension x20; hip abduction x20; Sit to stand x20;     board trunk flexion x10 lateral board rotation x10 each way seated    Seated LAQ and March x20 2lbs    Rainbow weight press seated 2x20      Pt requires buoyancy for support and to offload joints with strengthening exercises. Viscosity of the water is needed for resistance of strengthening; water current perturbations provides challenge to standing balance unsupported, requiring increased core activation   10/21 Pt seen for aquatic therapy today.  Treatment took place in water  3.25-4 ft in depth at the [facility] pool. Temp of water was 91.  Pt entered/exited the pool via stairs (step through pattern) independently with bilat rail.     Warm up: heel/toe walking x4 laps across pool chest deep side stepping x4 laps from shallow to deep;  Ambulation with noodle support: long strides, march cuing to use yellow noodle to stay supported. Backwards walking 2x10    Exercises; Slow march x20; squats x20; hip extension x20; hip abduction x20; Sit to stand x20;     board trunk flexion x10 lateral board rotation x10 each way seated    Seated LAQ and March x20 2lbs      Standing yellow weight push pull with glute activation x20    Walking with weights 2 laps; marching with weights 2 laps; side stepping with weights 2x10    Floating: push and pull and side to side perturbations   Manual therapy: manual trigger point release to uppr/gluteal and QL area   10/28  Pt seen for aquatic therapy today.  Treatment took place in water 3.25-4 ft in depth at the [facility] pool. Temp of water was 91.  Pt entered/exited the pool via stairs (step through pattern) independently with bilat rail.     Warm up: heel/toe walking x4 laps across pool chest deep side stepping x4 laps from shallow to deep;  Ambulation with noodle support: long strides, march cuing to use yellow noodle to stay supported. Backwards walking 2x10    Exercises; Slow march x20; squats x20; hip extension x20; hip abduction x20;    board trunk flexion x10 lateral board rotation x10 each way seated         Standing yellow weight push pull with glute activation x20      Floating: push and pull and side to side perturbations   Manual therapy: manual trigger point release to uppr/gluteal and QL area had to stop 2nd to increased pain in the lower back.                       Pt requires buoyancy for support and to offload joints with strengthening exercises. Viscosity of the water is needed  for resistance of strengthening;        water current perturbations provides challenge to standing balance unsupported, requiring increased core activation     REHAB POTENTIAL: Good   CLINICAL DECISION MAKING: Evolving/moderate complexity decreasing ability to stand and walk    EVALUATION COMPLEXITY: Moderate   PATIENT EDUCATION:  Education details: benefits of water  Person educated: Patient Education method: Explanation; demonstration  Education comprehension: verbalized understanding       CLINICAL IMPRESSION: Patient was limited today. He could not tolerate standing exercises. He also could not tolerate soft tissue mobilization in a float position. Therapy session was ended early. Patient's back  pain remains at a high level. It does not appear to be improving much. We will progress as tolerated.   Abnormal gait, decreased activity tolerance, decreased endurance, decreased mobility, difficulty walking, decreased ROM, decreased strength, decreased safety awareness, increased fascial restrictions, impaired perceived functional ability, impaired UE functional use, obesity, and pain   cleaning, driving, occupation, yard work, and shopping   Past/current experiences and 1-2 comorbidities: general arthritis; lipomas around the spien but no on the spine    REHAB POTENTIAL: Good   CLINICAL DECISION MAKING: Evolving/moderate complexity decreasing ability to stand and walk    EVALUATION COMPLEXITY: Moderate     GOALS: Goals reviewed with patient? Yes   SHORT TERM GOALS:   STG Name Target Date Goal status  1 Patient will perform full lumbar rotation without pain  Baseline:  05/22/2021 INITIAL  2 Patient will increase bilateral hip strength to 4+/5  Baseline:  05/22/2021 INITIAL  3 Patient will be independent with basic HEP  Baseline: 05/22/2021 INITIAL  LONG TERM GOALS:    LTG Name Target Date Goal status  1 Patient will stand for 1 hour without increased pain  Baseline:  06/12/2021 INITIAL  2 Patient will walk around the grocery store without increased pain  Baseline: 06/12/2021 INITIAL  3 Patient will be independent with a complete HEP in the pool and on land  Baseline: 06/12/2021 INITIAL  PT FREQUENCY: 2x/week   PT DURATION: 6 weeks   PLANNED INTERVENTIONS: Therapeutic exercises, Therapeutic activity, Neuro Muscular re-education, Balance training, Gait training, Patient/Family education, Joint mobilization, Stair training, Aquatic Therapy, Dry Needling, Electrical stimulation, Cryotherapy, Moist heat, Taping, Traction, Ultrasound, and Manual therapy   PLAN FOR NEXT SESSION: If in the pool work on standing exercises as neutral as possible; hip strengthening and core strengthening; on land consider TPDN and manual trigger point release; Patient did not tolerate supine well. Consider seated UE or LE core strengthening exercises        Carney Living PT DPT  06/01/2021, 3:57 PM

## 2021-06-03 ENCOUNTER — Encounter (HOSPITAL_BASED_OUTPATIENT_CLINIC_OR_DEPARTMENT_OTHER): Payer: Self-pay | Admitting: Physical Therapy

## 2021-06-05 ENCOUNTER — Ambulatory Visit (HOSPITAL_BASED_OUTPATIENT_CLINIC_OR_DEPARTMENT_OTHER): Payer: 59 | Attending: Orthopedic Surgery | Admitting: Physical Therapy

## 2021-06-05 ENCOUNTER — Other Ambulatory Visit: Payer: Self-pay

## 2021-06-05 ENCOUNTER — Encounter (HOSPITAL_BASED_OUTPATIENT_CLINIC_OR_DEPARTMENT_OTHER): Payer: Self-pay | Admitting: Physical Therapy

## 2021-06-05 DIAGNOSIS — G8929 Other chronic pain: Secondary | ICD-10-CM | POA: Insufficient documentation

## 2021-06-05 DIAGNOSIS — R252 Cramp and spasm: Secondary | ICD-10-CM | POA: Insufficient documentation

## 2021-06-05 DIAGNOSIS — R2689 Other abnormalities of gait and mobility: Secondary | ICD-10-CM | POA: Diagnosis present

## 2021-06-05 DIAGNOSIS — M545 Low back pain, unspecified: Secondary | ICD-10-CM | POA: Diagnosis present

## 2021-06-05 NOTE — Therapy (Signed)
OUTPATIENT PHYSICAL THERAPY TREATMENT NOTE   Patient Name: Clayton [name] MRN: [id number] DOB:11-09-1955, 65 y.o., male Today's Date: 06/05/2021  PCP: Kristen Loader, FNP REFERRING PROVIDER: Kristen Loader, FNP   PT End of Session - 06/05/21 0857     Visit Number 10    Number of Visits 14    Date for PT Re-Evaluation 06/18/21    Authorization Type [employer]    PT Start Time [phone]    PT Stop Time 0928    PT Time Calculation (min) 42 min    Activity Tolerance Patient tolerated treatment well    Behavior During Therapy WFL for tasks assessed/performed             Past Medical History:  Diagnosis Date   Arthritis    Asthma    pulmonary allergies- no asthma per pt   Atrophic condition of skin    Cellulitis/abscess - trunk    Cyst    shoulder   Diabetes mellitus without complication ([place])    Hyperlipidemia    Hypertension    Hypertrophic condition of skin    Lipoma    Obesity    Pneumonia    hx child   Sleep apnea    no cpap   Stones in the urinary tract    Vitamin D deficiency    Past Surgical History:  Procedure Laterality Date   cyst removed from [place] N/A 05/23/2015   Procedure: INSERTION OF MESH;  Surgeon: [name], MD;  Location: [facility];  Service: General;  Laterality: N/A;   MASS EXCISION  07/15/11   left shoulder mass    NASAL SINUS SURGERY---UVULA REMOVED  2004   Dr. Wilburn Cornelia   PROSTATE BIOPSY  9/16   [place] N/A 05/23/2015   Procedure: LAPAROSCOPIC UMBILICAL HERNIA REPAIR ;  Surgeon: [name], MD;  Location: [facility];  Service: General;  Laterality: N/A;   Patient Active Problem List   Diagnosis Date Noted   OSA (obstructive sleep apnea) 01/15/2021   Chronic respiratory failure with hypoxia ([place]) 01/15/2021   Shortness of breath 01/15/2021   Physical deconditioning 01/15/2021   Malnutrition of moderate degree 05/18/2020   Uncontrolled type 2 diabetes mellitus with  hyperglycemia ([place]) 04/25/2020   Acute hypoxemic respiratory failure due to COVID-19 ([place]) 04/24/2020   Hypokalemia 04/24/2020   Hyperglycemia 04/24/2020   Acute respiratory failure with hypoxia ([place])    Suspected COVID-19 virus infection    S/P hernia repair 05/23/2015   Chronic cough 06/25/2011   LIPOMA, left shoulder. 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 He isn;t having as much pain today. PAIN:  Are you having pain? Yes VAS scale: 5/10 Pain location: Low back Pain orientation: Left  PAIN TYPE: aching Pain description: intermittent  Aggravating factors: exercises last visit. Morning and nigh t Relieving factors: Rest  Today's treatment :     10/21 Pt seen for aquatic therapy today.  Treatment took place in water 3.25-4 ft in depth at the [facility] pool. Temp of water was 91.  Pt entered/exited the pool via stairs (step through pattern) independently with bilat rail.     Warm up: heel/toe walking x4 laps across pool chest deep side stepping x4 laps from shallow to deep;  Ambulation with noodle support: long strides, march cuing to use yellow noodle to stay supported. Backwards walking 2x10    Exercises; Slow march x20; squats x20; hip extension x20; hip abduction x20; Sit to stand x20;     board trunk flexion x10 lateral board rotation x10 each way seated    Seated LAQ and March x20 2lbs      Standing  yellow weight push pull with glute activation x20    Walking with weights 2 laps; marching with weights 2 laps; side stepping with weights 2x10    Floating: push and pull and side to side perturbations   Manual therapy: manual trigger point release to uppr/gluteal and QL area    10/28   Pt seen for aquatic therapy today.  Treatment took place in water 3.25-4 ft in depth at the [facility] pool. Temp of water was 91.  Pt entered/exited the pool via stairs (step through pattern) independently with bilat rail.     Warm up: heel/toe walking x4 laps across pool chest deep side stepping x4 laps from shallow to deep;  Ambulation with noodle support: long strides, march cuing to use yellow noodle to stay supported. Backwards walking 2x10    Exercises; Slow march x20; squats x20; hip extension x20; hip abduction x20;     board trunk flexion x10 lateral board rotation x10 each way seated     Attempted manual therapy but patient was unable to tolerate.      Standing yellow weight push pull with glute activation x20   11/1   Pt seen for aquatic therapy today.  Treatment took place in water 3.25-4 ft in depth at the [facility] pool. Temp of water was 91.  Pt entered/exited the pool via stairs (step through pattern) independently with bilat rail.     Warm up: heel/toe walking x4 laps across pool chest deep side stepping x4 laps from shallow to deep;  Ambulation with noodle support: long strides, march cuing to use yellow noodle to stay supported. Backwards walking 2x10    Exercises; Slow march x20; squats x20; hip extension x20; hip abduction x20;     board trunk flexion x10 lateral board rotation x10 each way seated    Step up and side steps 2x10 each side     Standing yellow weight push pull with glute activation x20                            Pt requires buoyancy for support and to offload joints with strengthening exercises. Viscosity of the water is needed for  resistance of strengthening;        water current perturbations provides challenge to standing balance unsupported, requiring increased core activation     REHAB POTENTIAL: Good   CLINICAL DECISION MAKING: Evolving/moderate complexity decreasing ability to stand and walk    EVALUATION COMPLEXITY: Moderate   PATIENT EDUCATION:  Education details: benefits of water  Person educated: Patient Education method: Explanation; demonstration  Education comprehension: verbalized understanding       CLINICAL  IMPRESSION: Patient tolerated treatment better today. He had less pain with hi exercises. Therapy was able to add back in steps to his program. He could feel pain in his back with side stepping.  Therapy reviewed the different resistance with the hand weights.   Abnormal gait, decreased activity tolerance, decreased endurance, decreased mobility, difficulty walking, decreased ROM, decreased strength, decreased safety awareness, increased fascial restrictions, impaired perceived functional ability, impaired UE functional use, obesity, and pain   cleaning, driving, occupation, yard work, and shopping   Past/current experiences and 1-2 comorbidities: general arthritis; lipomas around the spien but no on the spine    REHAB POTENTIAL: Good   CLINICAL DECISION MAKING: Evolving/moderate complexity decreasing ability to stand and walk    EVALUATION COMPLEXITY: Moderate     GOALS: Goals reviewed with patient? Yes   SHORT TERM GOALS:   STG Name Target Date Goal status  1 Patient will perform full lumbar rotation without pain  Baseline:  05/22/2021 INITIAL  2 Patient will increase bilateral hip strength to 4+/5  Baseline:  05/22/2021 INITIAL  3 Patient will be independent with basic HEP  Baseline: 05/22/2021 INITIAL  LONG TERM GOALS:    LTG Name Target Date Goal status  1 Patient will stand for 1 hour without increased pain  Baseline: 06/12/2021 INITIAL  2 Patient will walk around the  grocery store without increased pain  Baseline: 06/12/2021 INITIAL  3 Patient will be independent with a complete HEP in the pool and on land  Baseline: 06/12/2021 INITIAL  PT FREQUENCY: 2x/week   PT DURATION: 6 weeks   PLANNED INTERVENTIONS: Therapeutic exercises, Therapeutic activity, Neuro Muscular re-education, Balance training, Gait training, Patient/Family education, Joint mobilization, Stair training, Aquatic Therapy, Dry Needling, Electrical stimulation, Cryotherapy, Moist heat, Taping, Traction, Ultrasound, and Manual therapy   PLAN FOR NEXT SESSION: If in the pool work on standing exercises as neutral as possible; hip strengthening and core strengthening; on land consider TPDN and manual trigger point release; Patient did not tolerate supine well. Consider seated UE or LE core strengthening exercises            Carney Living PT DPT  06/05/2021, 9:32 AM

## 2021-06-07 ENCOUNTER — Other Ambulatory Visit: Payer: Self-pay

## 2021-06-07 ENCOUNTER — Ambulatory Visit (HOSPITAL_BASED_OUTPATIENT_CLINIC_OR_DEPARTMENT_OTHER): Payer: 59 | Admitting: Physical Therapy

## 2021-06-07 ENCOUNTER — Encounter (HOSPITAL_BASED_OUTPATIENT_CLINIC_OR_DEPARTMENT_OTHER): Payer: Self-pay | Admitting: Physical Therapy

## 2021-06-07 DIAGNOSIS — G8929 Other chronic pain: Secondary | ICD-10-CM

## 2021-06-07 DIAGNOSIS — M545 Low back pain, unspecified: Secondary | ICD-10-CM | POA: Diagnosis not present

## 2021-06-07 DIAGNOSIS — R2689 Other abnormalities of gait and mobility: Secondary | ICD-10-CM

## 2021-06-07 DIAGNOSIS — R252 Cramp and spasm: Secondary | ICD-10-CM

## 2021-06-07 NOTE — Therapy (Signed)
OUTPATIENT PHYSICAL THERAPY TREATMENT NOTE/Discharge    Patient Name: Clayton [name] MRN: [id number] DOB:1955-10-04, 65 y.o., male Today's Date: 06/07/2021  PCP: Soundra Pilon, FNP REFERRING PROVIDER: Soundra Pilon, FNP    Past Medical History:  Diagnosis Date   Arthritis    Asthma    pulmonary allergies- no asthma per pt   Atrophic condition of skin    Cellulitis/abscess - trunk    Cyst    shoulder   Diabetes mellitus without complication (HCC)    Hyperlipidemia    Hypertension    Hypertrophic condition of skin    Lipoma    Obesity    Pneumonia    hx child   Sleep apnea    no cpap   Stones in the urinary tract    Vitamin D deficiency    Past Surgical History:  Procedure Laterality Date   cyst removed from tailbone  1980   INSERTION OF MESH N/A 05/23/2015   Procedure: INSERTION OF MESH;  Surgeon: Axel Filler, MD;  Location: [facility];  Service: General;  Laterality: N/A;   MASS EXCISION  07/15/11   left shoulder mass    NASAL SINUS SURGERY---UVULA REMOVED  2004   Dr. Annalee Genta   PROSTATE BIOPSY  9/16   TONSILLECTOMY     UMBILICAL HERNIA REPAIR N/A 05/23/2015   Procedure: LAPAROSCOPIC UMBILICAL HERNIA REPAIR ;  Surgeon: Axel Filler, MD;  Location: [facility];  Service: General;  Laterality: N/A;   Patient Active Problem List   Diagnosis Date Noted   OSA (obstructive sleep apnea) 01/15/2021   Chronic respiratory failure with hypoxia (HCC) 01/15/2021   Shortness of breath 01/15/2021   Physical deconditioning 01/15/2021   Malnutrition of moderate degree 05/18/2020   Uncontrolled type 2 diabetes mellitus with hyperglycemia (HCC) 04/25/2020   Acute hypoxemic respiratory failure due to COVID-19 (HCC) 04/24/2020   Hypokalemia 04/24/2020   Hyperglycemia 04/24/2020   Acute respiratory failure with hypoxia (HCC)    Suspected COVID-19 virus infection    S/P hernia repair 05/23/2015   Chronic cough 06/25/2011   LIPOMA, left shoulder. 04/04/2008   ALLERGIC RHINITIS  12/21/2007   GERD 12/21/2007   NEPHROLITHIASIS, HX OF 12/21/2007  REFERRING PROVIDER: Dr Venita Lick    REFERRING DIAG: M54.50 (ICD-10-CM) - Low back pain, unspecified    THERAPY DIAG:  Chronic bilateral low back pain without sciatica - Plan: PT plan of care cert/re-cert   Cramp and spasm - Plan: PT plan of care cert/re-cert    PERTINENT HISTORY:  Lipoma of t-spine and sacral area; Arthritis  general PRECAUTIONS: None    SUBJECTIVE: Patient's back is back to hurting again today. He reports it just comes and goes. Today it is more. He did some more driving yesterday.    Are you having pain? Yes VAS scale: 8/10 Pain location: Low back Pain orientation: Left  PAIN TYPE: aching Pain description: intermittent  Aggravating factors: exercises last visit. Morning and nigh t Relieving factors: Rest   Today's treatment :     10/21 Pt seen for aquatic therapy today.  Treatment took place in water 3.25-4 ft in depth at the [facility] pool. Temp of water was 91.  Pt entered/exited the pool via stairs (step through pattern) independently with bilat rail.     Warm up: heel/toe walking x4 laps across pool chest deep side stepping x4 laps from shallow to deep;  Ambulation with noodle support: long strides, march cuing to use yellow noodle to stay supported. Backwards walking 2x10  Exercises; Slow march x20; squats x20; hip extension x20; hip abduction x20; Sit to stand x20;     board trunk flexion x10 lateral board rotation x10 each way seated    Seated LAQ and March x20 2lbs      Standing yellow weight push pull with glute activation x20    Walking with weights 2 laps; marching with weights 2 laps; side stepping with weights 2x10    Floating: push and pull and side to side perturbations   Manual therapy: manual trigger point release to uppr/gluteal and QL area    10/28   Pt seen for aquatic therapy today.  Treatment took place in water 3.25-4 ft in depth at the  [facility] pool. Temp of water was 91.  Pt entered/exited the pool via stairs (step through pattern) independently with bilat rail.     Warm up: heel/toe walking x4 laps across pool chest deep side stepping x4 laps from shallow to deep;  Ambulation with noodle support: long strides, march cuing to use yellow noodle to stay supported. Backwards walking 2x10    Exercises; Slow march x20; squats x20; hip extension x20; hip abduction x20;     board trunk flexion x10 lateral board rotation x10 each way seated     Attempted manual therapy but patient was unable to tolerate.      Standing yellow weight push pull with glute activation x20    11/1   Pt seen for aquatic therapy today.  Treatment took place in water 3.25-4 ft in depth at the [facility] pool. Temp of water was 91.  Pt entered/exited the pool via stairs (step through pattern) independently with bilat rail.     Warm up: heel/toe walking x4 laps across pool chest deep side stepping x4 laps from shallow to deep;  Ambulation with noodle support: long strides, march cuing to use yellow noodle to stay supported. Backwards walking 2x10    Exercises; Slow march x20; squats x20; hip extension x20; hip abduction x20;     board trunk flexion x10 lateral board rotation x10 each way seated    Step up and side steps 2x10 each side     Standing yellow weight push pull with glute activation x20    LAQ 2x10   Hand paddles shown how to do full movements and quick, smaller movements                         Pt requires buoyancy for support and to offload joints with strengthening exercises. Viscosity of the water is needed for resistance of strengthening;        water current perturbations provides challenge to standing balance unsupported, requiring increased core activation   11/3  Pt seen for aquatic therapy today.  Treatment took place in water 3.25-4 ft in depth at the [facility] pool. Temp of water was  91.  Pt entered/exited the pool via stairs (step through pattern) independently with bilat rail.     Warm up: heel/toe walking x4 laps across pool chest deep side stepping had pain so patient stopped    Exercises; Slow march x20; squats x20; hip extension x20; hip abduction x20;     board trunk flexion x10 lateral board rotation x10 each way seated    Step up and side steps 2x10 each side     Standing yellow weight push pull with glute activation x20   Hand paddles x20 flex/ext  Pt requires buoyancy for support and to offload joints with strengthening exercises. Viscosity of the water is needed for resistance of strengthening;        water current perturbations provides challenge to standing balance unsupported, requiring increased core activation     REHAB POTENTIAL: Good   CLINICAL DECISION MAKING: Evolving/moderate complexity decreasing ability to stand and walk    EVALUATION COMPLEXITY: Moderate   PATIENT EDUCATION:  Education details: benefits of water  Person educated: Patient Education method: Explanation; demonstration  Education comprehension: verbalized understanding       CLINICAL IMPRESSION: Patient has reached max benefit from therapy at this time. He has a complete exercise program. There are are PT sessions were he can not tolerate even the water exercises. 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 Therapy on top of that seems to exacerbate things at times. He will continue with his exercises as tolerated on his own. He will focus most on aquatic exercises. D/C to HEP at this time. See below for goal specific progress.     Abnormal gait, decreased activity tolerance, decreased endurance, decreased mobility, difficulty walking, decreased ROM, decreased strength, decreased safety awareness, increased fascial restrictions, impaired perceived functional ability, impaired UE functional use, obesity, and pain   cleaning, driving, occupation, yard  work, and shopping   Past/current experiences and 1-2 comorbidities: general arthritis; lipomas around the spien but no on the spine    REHAB POTENTIAL: Good   CLINICAL DECISION MAKING: Evolving/moderate complexity decreasing ability to stand and walk    EVALUATION COMPLEXITY: Moderate     GOALS: Goals reviewed with patient? Yes   SHORT TERM GOALS:   STG Name Target Date Goal status  1 Patient will perform full lumbar rotation without pain  Baseline:  05/22/2021 Continues to have significant pain at times   Not met  2 Patient will increase bilateral hip strength to 4+/5  Baseline:  05/22/2021 Strength has improved but not 4+/5 in hip flexors   Partially met   3 Patient will be independent with basic HEP  Baseline: 05/22/2021 Has basic HEP  met  LONG TERM GOALS:    LTG Name Target Date Goal status  1 Patient will stand for 1 hour without increased pain  Baseline: 06/12/2021 Depends on the day. No major change   Not met   2 Patient will walk around the grocery store without increased pain  Baseline: 06/12/2021  Depends on the day. No major change   Not me  3 Patient will be independent with a complete HEP in the pool and on land  Baseline: 06/12/2021 Has complete HEP   PT FREQUENCY: 2x/week   PT DURATION: 6 weeks   PLANNED INTERVENTIONS: Therapeutic exercises, Therapeutic activity, Neuro Muscular re-education, Balance training, Gait training, Patient/Family education, Joint mobilization, Stair training, Aquatic Therapy, Dry Needling, Electrical stimulation, Cryotherapy, Moist heat, Taping, Traction, Ultrasound, and Manual therapy   PLAN FOR NEXT SESSION: If in the pool work on standing exercises as neutral as possible; hip strengthening and core strengthening; on land consider TPDN and manual trigger point release; Patient did not tolerate supine well. Consider seated UE or LE core strengthening exercises       PHYSICAL THERAPY DISCHARGE SUMMARY  Visits from Start of  Care: 11  Current functional level related to goals / functional outcomes: Has HEP; continued pain   Remaining deficits: Continued pain with different activity    Education / Equipment: HEP   Patient agrees to discharge. Patient goals were partially met. Patient is  being discharged due to maximized rehab potential.        Carolyne Littles PT DPT  06/07/2021, 8:58 AM

## 2021-09-05 DIAGNOSIS — R972 Elevated prostate specific antigen [PSA]: Secondary | ICD-10-CM | POA: Diagnosis not present

## 2021-09-12 DIAGNOSIS — N5201 Erectile dysfunction due to arterial insufficiency: Secondary | ICD-10-CM | POA: Diagnosis not present

## 2021-09-12 DIAGNOSIS — N401 Enlarged prostate with lower urinary tract symptoms: Secondary | ICD-10-CM | POA: Diagnosis not present

## 2021-09-12 DIAGNOSIS — R3915 Urgency of urination: Secondary | ICD-10-CM | POA: Diagnosis not present

## 2021-09-12 DIAGNOSIS — R972 Elevated prostate specific antigen [PSA]: Secondary | ICD-10-CM | POA: Diagnosis not present

## 2021-09-24 DIAGNOSIS — I10 Essential (primary) hypertension: Secondary | ICD-10-CM | POA: Diagnosis not present

## 2021-09-24 DIAGNOSIS — Z13220 Encounter for screening for lipoid disorders: Secondary | ICD-10-CM | POA: Diagnosis not present

## 2021-09-24 DIAGNOSIS — E559 Vitamin D deficiency, unspecified: Secondary | ICD-10-CM | POA: Diagnosis not present

## 2021-09-24 DIAGNOSIS — Z0000 Encounter for general adult medical examination without abnormal findings: Secondary | ICD-10-CM | POA: Diagnosis not present

## 2021-09-24 DIAGNOSIS — D6869 Other thrombophilia: Secondary | ICD-10-CM | POA: Diagnosis not present

## 2021-09-24 DIAGNOSIS — Z125 Encounter for screening for malignant neoplasm of prostate: Secondary | ICD-10-CM | POA: Diagnosis not present

## 2021-09-24 DIAGNOSIS — E1129 Type 2 diabetes mellitus with other diabetic kidney complication: Secondary | ICD-10-CM | POA: Diagnosis not present

## 2021-09-27 ENCOUNTER — Telehealth: Payer: Self-pay | Admitting: Oncology

## 2021-09-27 NOTE — Telephone Encounter (Signed)
Attempted to contact patient to schedule from referral, no answer so left voicemail's at both numbers for patient to call back.

## 2021-09-28 ENCOUNTER — Telehealth: Payer: Self-pay | Admitting: Oncology

## 2021-09-28 NOTE — Telephone Encounter (Signed)
Contacted patient to schedule initial visit from referral, he is aware of date and time.

## 2021-10-02 DIAGNOSIS — M5416 Radiculopathy, lumbar region: Secondary | ICD-10-CM | POA: Diagnosis not present

## 2021-10-10 DIAGNOSIS — R0602 Shortness of breath: Secondary | ICD-10-CM | POA: Diagnosis not present

## 2021-10-10 DIAGNOSIS — Z1211 Encounter for screening for malignant neoplasm of colon: Secondary | ICD-10-CM | POA: Diagnosis not present

## 2021-10-18 ENCOUNTER — Encounter: Payer: Self-pay | Admitting: Oncology

## 2021-10-18 ENCOUNTER — Inpatient Hospital Stay: Payer: BC Managed Care – PPO | Attending: Oncology | Admitting: Oncology

## 2021-10-18 ENCOUNTER — Other Ambulatory Visit: Payer: Self-pay

## 2021-10-18 VITALS — BP 130/70 | HR 86 | Temp 97.8°F | Resp 18 | Ht 67.0 in | Wt 244.2 lb

## 2021-10-18 DIAGNOSIS — I824Y3 Acute embolism and thrombosis of unspecified deep veins of proximal lower extremity, bilateral: Secondary | ICD-10-CM

## 2021-10-18 NOTE — Progress Notes (Signed)
?[facility] ?New Patient Consult ? ? ?Requesting MD: ?Kristen Loader, Fnp ?[address] ?[place],  [place] [address] ? ? ?Clayton [name] ?66 y.o.  ?Dec 18, 1955 ? ?  ?Reason for Consult: Deep vein thrombosis ? ? ?HPI: [name] was diagnosed bilateral lower extremity deep vein thrombosis 04/28/2020.  A Doppler confirmed acute deep vein thrombosis of the right posterior tibial veins and right peroneal veins.  There was acute deep vein thrombosis of the left posterior tibial veins and left soleal veins. ? ?He was hospitalized 04/24/2020 - 05/30/2020 with COVID-19 pneumonia.  He was treated with Lovenox anticoagulation initially and then converted to apixaban.  He continues apixaban anticoagulation. ? ?He reports a total of 11 weeks of hospitalization/rehabilitation admission.  He continues to have exertional dyspnea related to chronic respiratory failure from the COVID-19 infection.  He is followed by pulmonary medicine.  He no longer uses supplemental oxygen. ? ?He has no previous history of arterial or venous thromboembolic disease.  No family history of venous thrombosis or arterial thrombosis at a young age. ? ? ?Past Medical History:  ?Diagnosis Date  ? Arthritis   ? Asthma   ? pulmonary allergies- no asthma per pt  ? Atrophic condition of skin   ? Cellulitis/abscess - trunk   ? Cyst   ? shoulder  ? Diabetes mellitus without complication ([place])   ? Hyperlipidemia   ? Hypertension   ? Hypertrophic condition of skin   ? Lipoma   ? Obesity   ? Pneumonia   ? hx child  ? Sleep apnea   ? no cpap  ? Stones in the urinary tract   ? Vitamin D deficiency   ? .  COVID-23 April 2020 ?  Marland Kitchen  Bilateral lower extremity DVTs September 2021 ?  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  .  Elevated PSA-he reports a negative prostate biopsy 6 years  ago ? ?Past Surgical History:  ?Procedure Laterality Date  ? cyst removed from [place]  ? INSERTION OF MESH N/A 05/23/2015  ? Procedure: INSERTION OF MESH;  Surgeon: [name], MD;  Location: [facility];  Service: General;  Laterality: N/A;  ? MASS EXCISION  07/15/11  ? left shoulder mass   ? NASAL SINUS SURGERY---UVULA REMOVED  2004  ? Dr. Wilburn Cornelia  ? PROSTATE BIOPSY  9/16  ? TONSILLECTOMY    ? UMBILICAL HERNIA REPAIR N/A 05/23/2015  ? Procedure: LAPAROSCOPIC UMBILICAL HERNIA REPAIR ;  Surgeon: [name], MD;  Location: [facility];  Service: General;  Laterality: N/A;  ? ? ?Medications: Reviewed ? ?Allergies: No Known Allergies ? ?Family history: No family history of cancer or venous thrombosis ? ?Social History:  ? ?He lives with his wife in [place].Marland Kitchen  He  previously worked as an Media planner.  He became disabled following the COVID-19 infection.  He does not use cigarettes.  He reports social alcohol use.  No transfusion history.  No risk factor for HIV or hepatitis.  He has not received COVID-19 vaccines ? ?ROS:  ? ?Positives include: Urinary frequency, exertional dyspnea, chronic low back pain secondary to lumbar spine disease-followed by orthopedics, "tightness "in the hands, anxiety after the COVID-19 infection ? ?A complete ROS was otherwise negative. ? ?Physical Exam: ? ?Blood pressure 130/70, pulse 86, temperature 97.8 ?F (36.6 ?C), temperature source Oral, resp. rate 18, height '5\' 7"'$  (1.702 m), weight 244 lb 3.2 oz (110.8 kg), SpO2 98 %. ? ?HEENT: Neck without mass clear bilaterally ?Lungs: Clear bilaterally ?Cardiac: Regular rate and rhythm ?Abdomen: No hepatosplenomegaly, smooth soft fullness in the left lower lateral abdomen (he reports being diagnosed with a hernia in this area)  ?Vascular: No leg edema  ?Lymph nodes: No cervical, supraclavicular, axillary, or inguinal nodes ?Neurologic: Alert and oriented, the motor exam appears intact in the upper and lower extremities  bilaterally ?Skin: No rash ?Musculoskeletal: Mild tenderness at the level of the lumbar spine ? ? ?LAB: ? ?CBC ? ?Lab Results  ?Component Value Date  ? WBC 10.3 06/18/2020  ? HGB 10.6 (L) 06/18/2020  ? HCT 33.6 (L) 06/18/2020  ? MCV 97.4 06/18/2020  ? PLT 274 06/18/2020  ? NEUTROABS 11.6 (H) 05/31/2020  ?  ? ?  ? ?CMP  ?Lab Results  ?Component Value Date  ? NA 140 06/18/2020  ? K 4.1 06/19/2020  ? CL 102 06/18/2020  ? CO2 30 06/18/2020  ? GLUCOSE 85 06/18/2020  ? BUN 9 06/18/2020  ? CREATININE 0.52 (L) 06/18/2020  ? CALCIUM 8.7 (L) 06/18/2020  ? PROT 5.2 (L) 06/13/2020  ? ALBUMIN 2.5 (L) 06/13/2020  ? AST 22 06/13/2020  ? ALT 33 06/13/2020  ? ALKPHOS 56 06/13/2020  ? BILITOT 0.6 06/13/2020  ? GFRNONAA >60 06/18/2020  ? GFRAA >60 05/09/2020  ? ? ? ? ? ? ?Assessment/Plan:  ? ?Bilateral lower extremity deep vein thrombosis 04/28/2020 while hospitalized with COVID-19 infection ?Lovenox while in the hospital, then apixaban anticoagulation ? ?COVID-23 April 2020 requiring a lengthy hospital admission secondary to respiratory failure/sepsis ?Chronic respiratory failure following COVID-19 infection ?Diabetes ?Sleep apnea ?Anxiety ?History of elevated PSA-followed by urology, reports negative biopsy 6 years ago ?Umbilical hernia repair, reports being diagnosed with a left abdominal hernia-evaluated by surgery ? ? ?Disposition:  ? ?[name] was diagnosed with bilateral lower extremity deep vein thromboses while hospitalized with COVID-19 infection in September 2021.  He has been maintained on apixaban anticoagulation for the past year and a half. ? ?[name] had a major risk factor (COVID-19 infection requiring hospital admission) for developing venous thrombosis at the time of diagnosis.  This risk factor is no longer present.  He has no previous history of venous or arterial thromboembolic disease.  He is only apparent risk factor for recurrent thrombosis is obesity. ? ?I recommend discontinue anticoagulation.  He  will increase exercise and try to lose weight.  He will alert his physicians of the thrombosis history if he is again diagnosed with COVID-19 infection or has surgery/immobility in the future.  He will seek medical attention for symptoms of thrombosis. ? ?He plans to continue clinical follow-up with [facility] medicine and pulmonary medicine.  I am available to see him in the future as needed. ? ?Betsy Coder, MD  ?10/18/2021, 2:00 PM ? ? ?

## 2021-11-18 IMAGING — DX DG CHEST 1V PORT
1 series · 1 of 1 positions shown · non-contrast
Comparison: 04/24/2020

CLINICAL DATA: 64-year-old male with history of COVID pneumonia,
shortness of breath, and cough.

EXAM:
PORTABLE CHEST 1 VIEW

[chest ap]
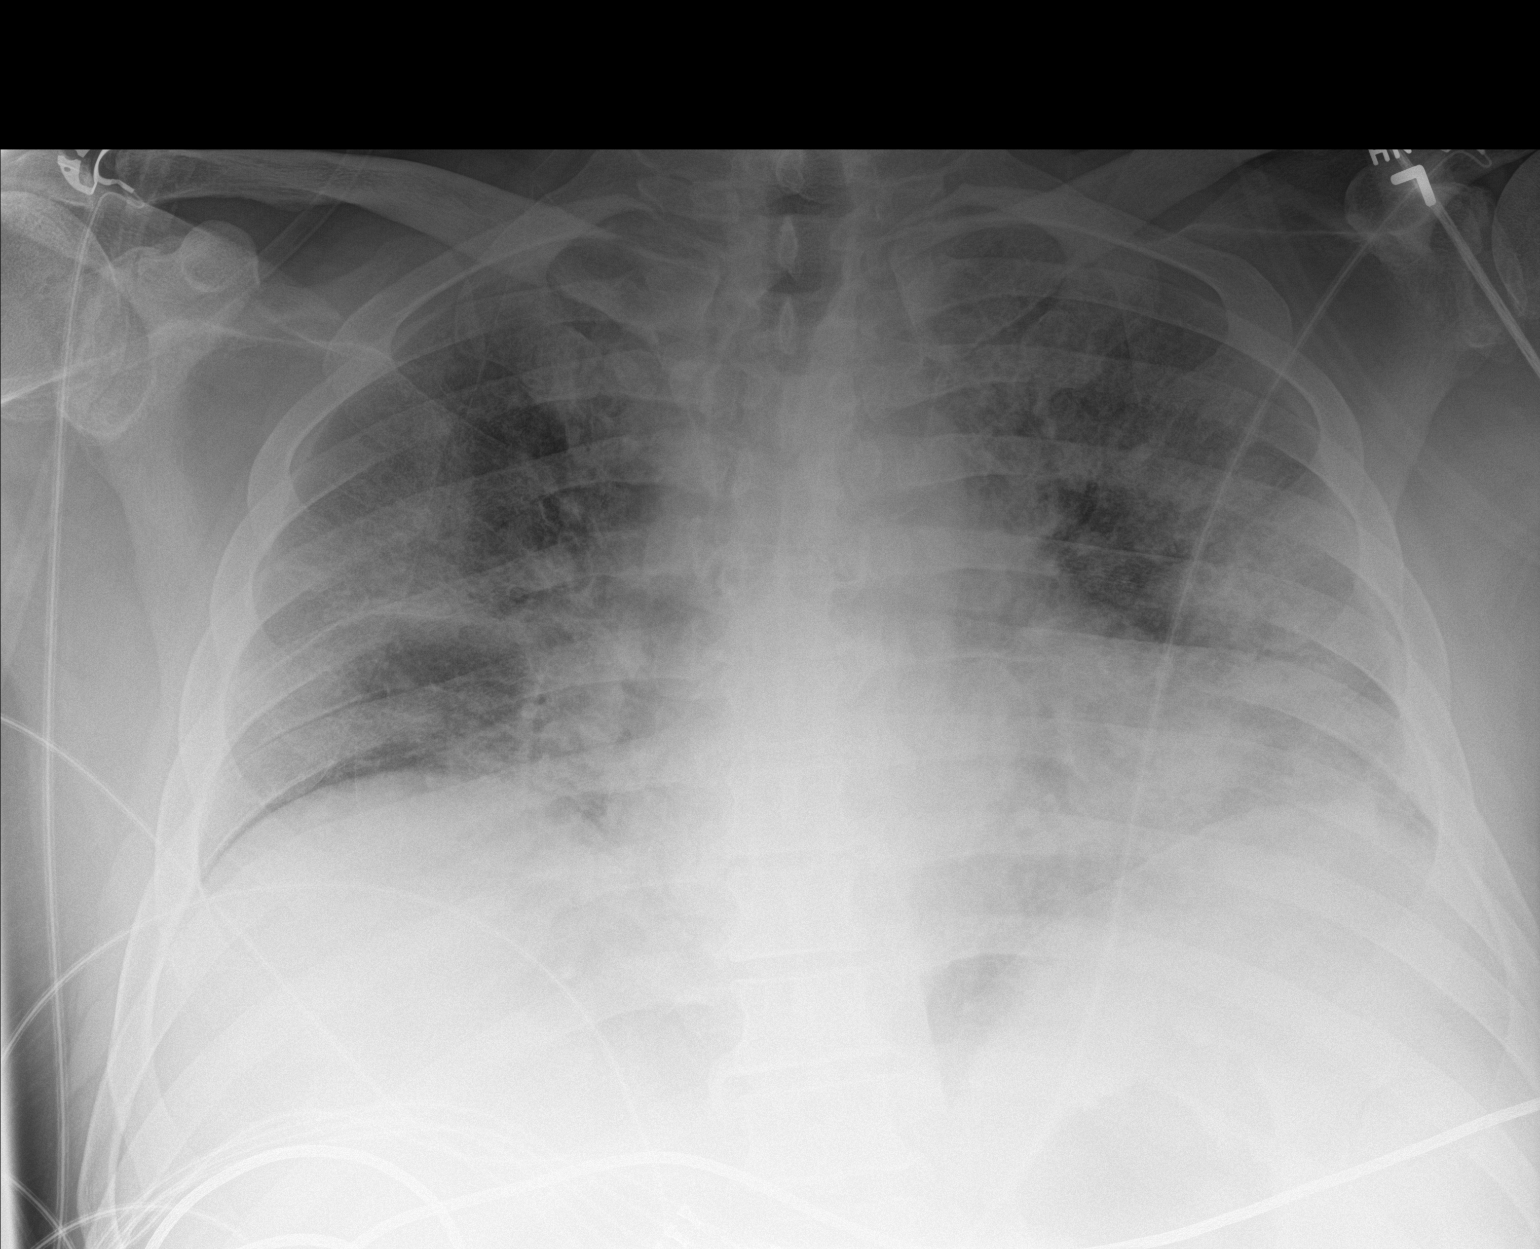

[1 of 1 positions shown; findings below may reference images not displayed]

FINDINGS: Partial obscuration of the cardiomediastinal silhouette which is
unchanged. Low lung volumes. There are scattered multifocal patchy
pulmonary opacities with a peripheral prominence, now increased in
the perihilar regions. No significant pleural effusion or evidence
in a thorax. The visualized osseous structures are within normal
limits.
IMPRESSION: Interval worsening diffuse pulmonary opacities compatible with
worsening COVID pneumonia.

## 2021-11-21 IMAGING — DX DG CHEST 1V PORT
1 series · 1 of 1 positions shown · non-contrast
Comparison: [REDACTED]-one.

CLINICAL DATA: Shortness of breath.  Sepsis.  COVID positive.

EXAM:
PORTABLE CHEST 1 VIEW

[chest ap]
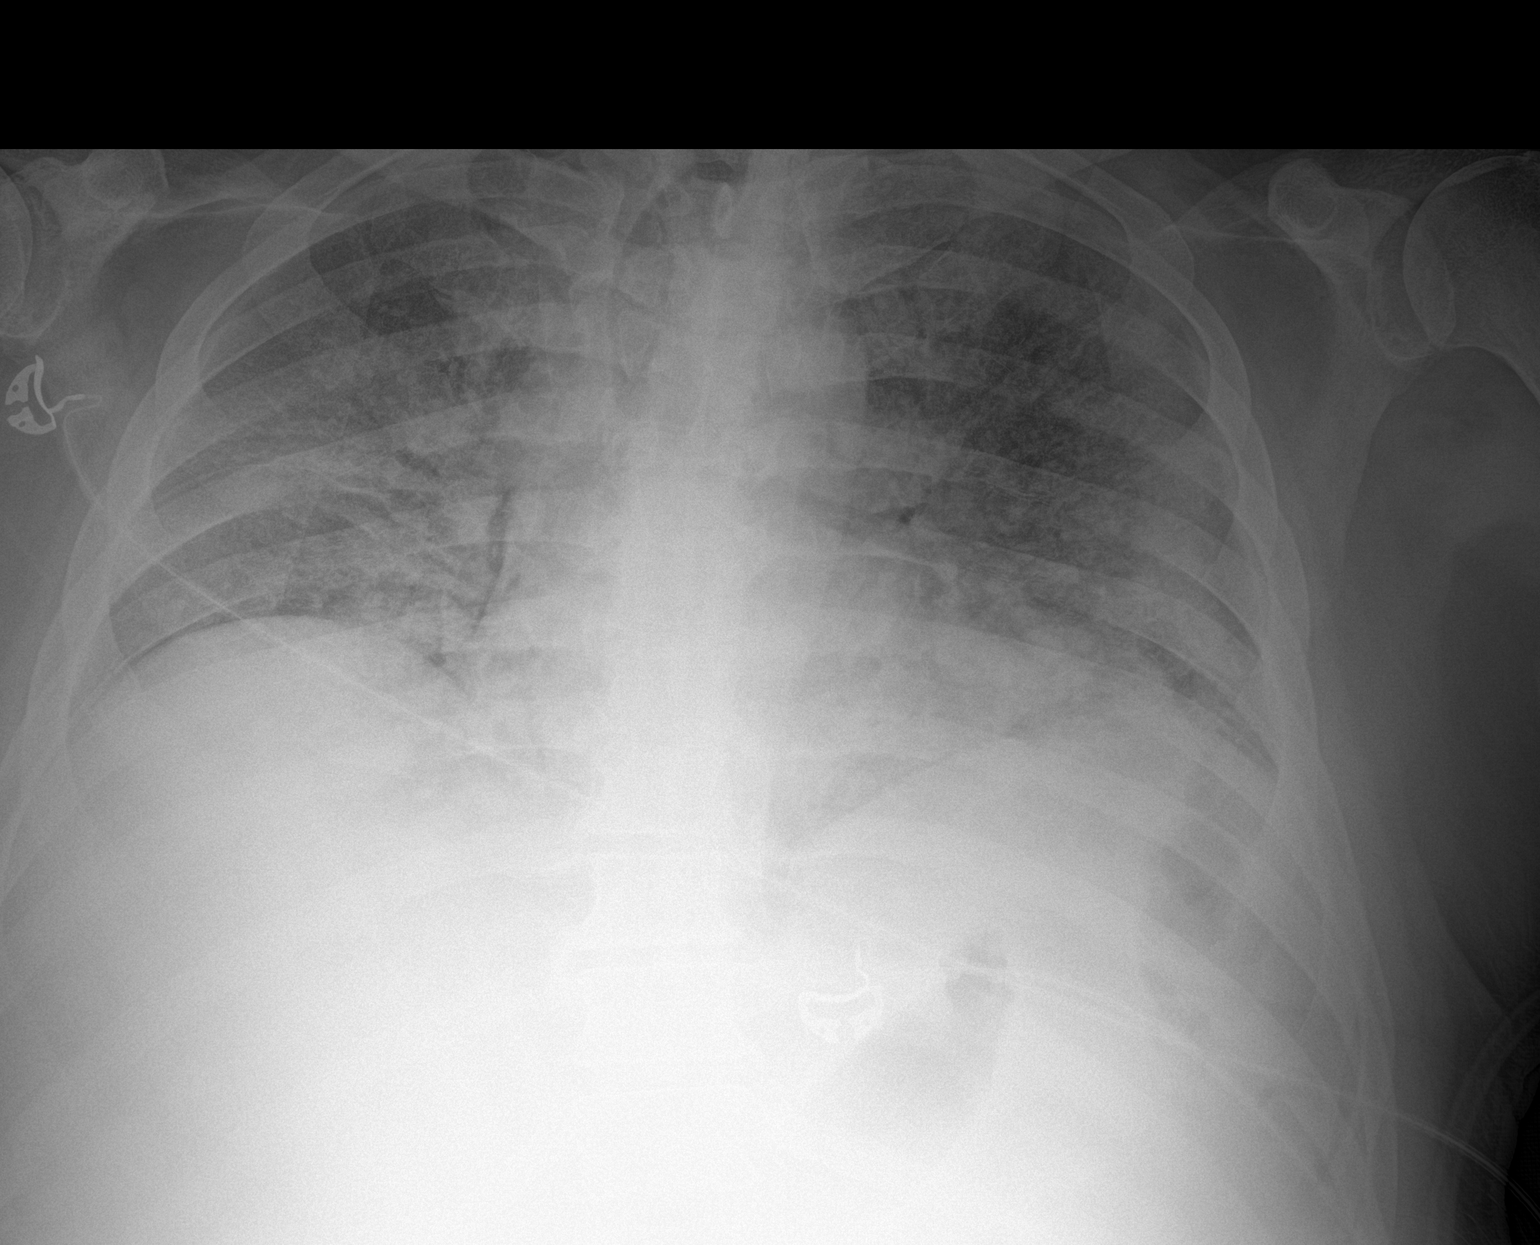

[1 of 1 positions shown; findings below may reference images not displayed]

FINDINGS: Heart size stable. Diffuse severe bilateral pulmonary
infiltrates/edema again noted. No pleural effusion or pneumothorax.
Degenerative change thoracic spine.
IMPRESSION: Diffuse severe bilateral pulmonary infiltrates/edema again noted.
Age.

## 2021-12-01 IMAGING — DX DG CHEST 1V PORT
1 series · 1 of 1 positions shown · non-contrast
Comparison: 05/02/2020

CLINICAL DATA: [id number]

EXAM:
PORTABLE CHEST 1 VIEW

[chest ap]
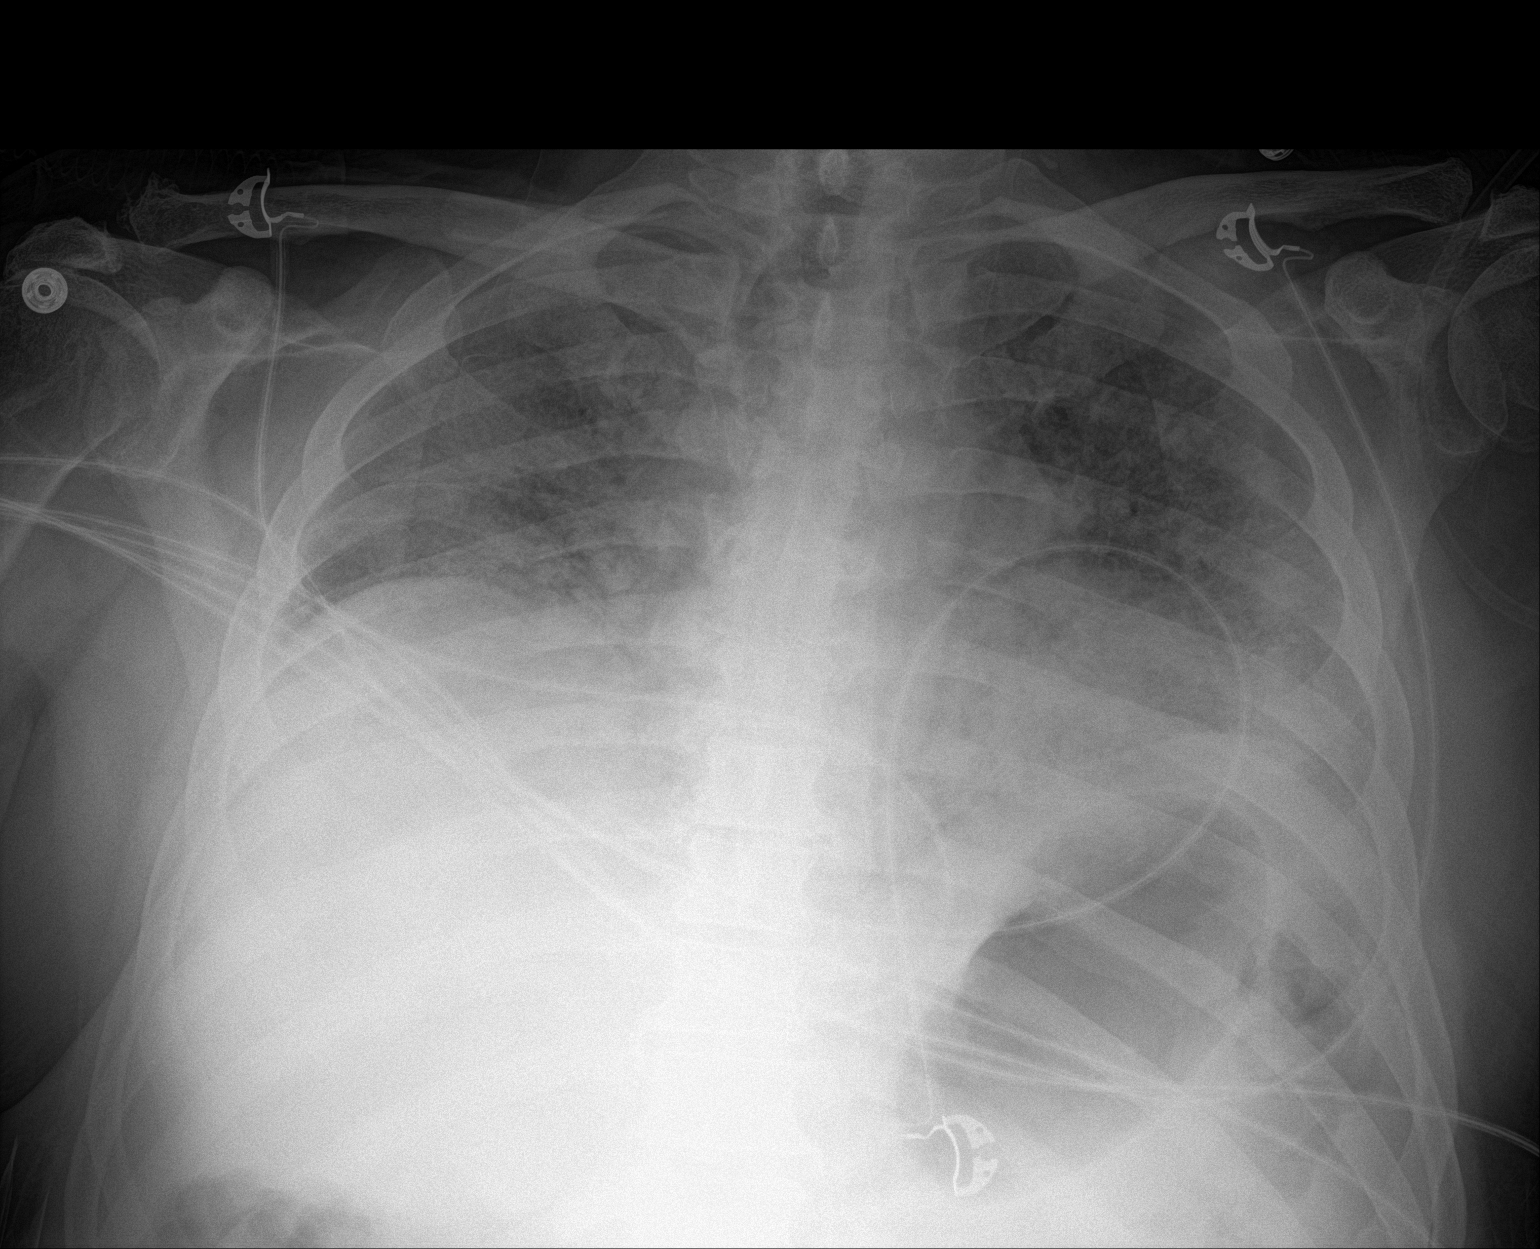

[1 of 1 positions shown; findings below may reference images not displayed]

FINDINGS: 1383 hours. Low lung volumes. Cardiopericardial silhouette is at
upper limits of normal for size. Diffuse bilateral airspace disease
is similar to prior. No substantial pleural effusion. The visualized
bony structures of the thorax show no acute abnormality. Telemetry
leads overlie the chest.
IMPRESSION: Low volume film with persistent diffuse bilateral airspace disease.

## 2021-12-06 IMAGING — DX DG CHEST 1V PORT
1 series · 1 of 1 positions shown · non-contrast
Comparison: May 12, 2020.

CLINICAL DATA: [id number].

EXAM:
PORTABLE CHEST 1 VIEW

[chest ap]
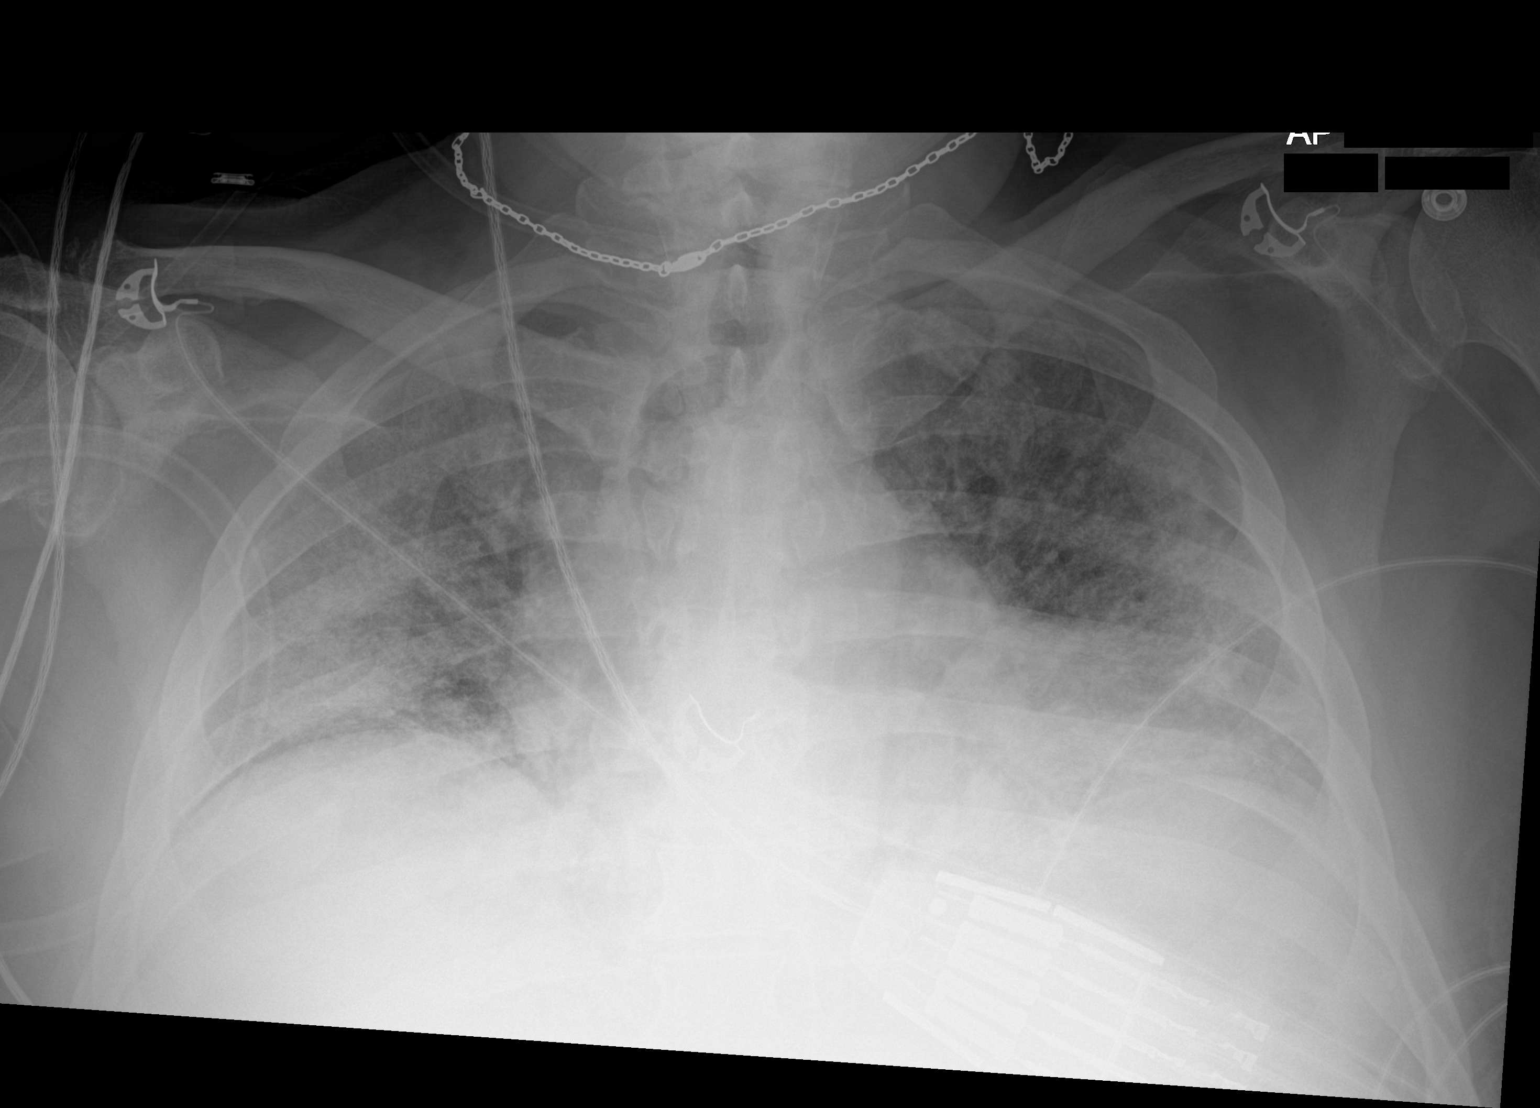

[1 of 1 positions shown; findings below may reference images not displayed]

FINDINGS: Stable cardiomediastinal silhouette. Hypoinflation of the lungs is
noted. Stable diffuse airspace opacities are noted concerning for
multifocal pneumonia. No pneumothorax or significant pleural
effusion is noted. Bony thorax is unremarkable.
IMPRESSION: Stable diffuse airspace opacities are noted concerning for
multifocal pneumonia.

## 2021-12-11 IMAGING — DX DG CHEST 1V PORT
1 series · 1 of 1 positions shown · non-contrast
Comparison: 05/17/2020

CLINICAL DATA: History of 1THD3-JP infection. Shortness of breath
and cough.

EXAM:
PORTABLE CHEST 1 VIEW

[chest ap]
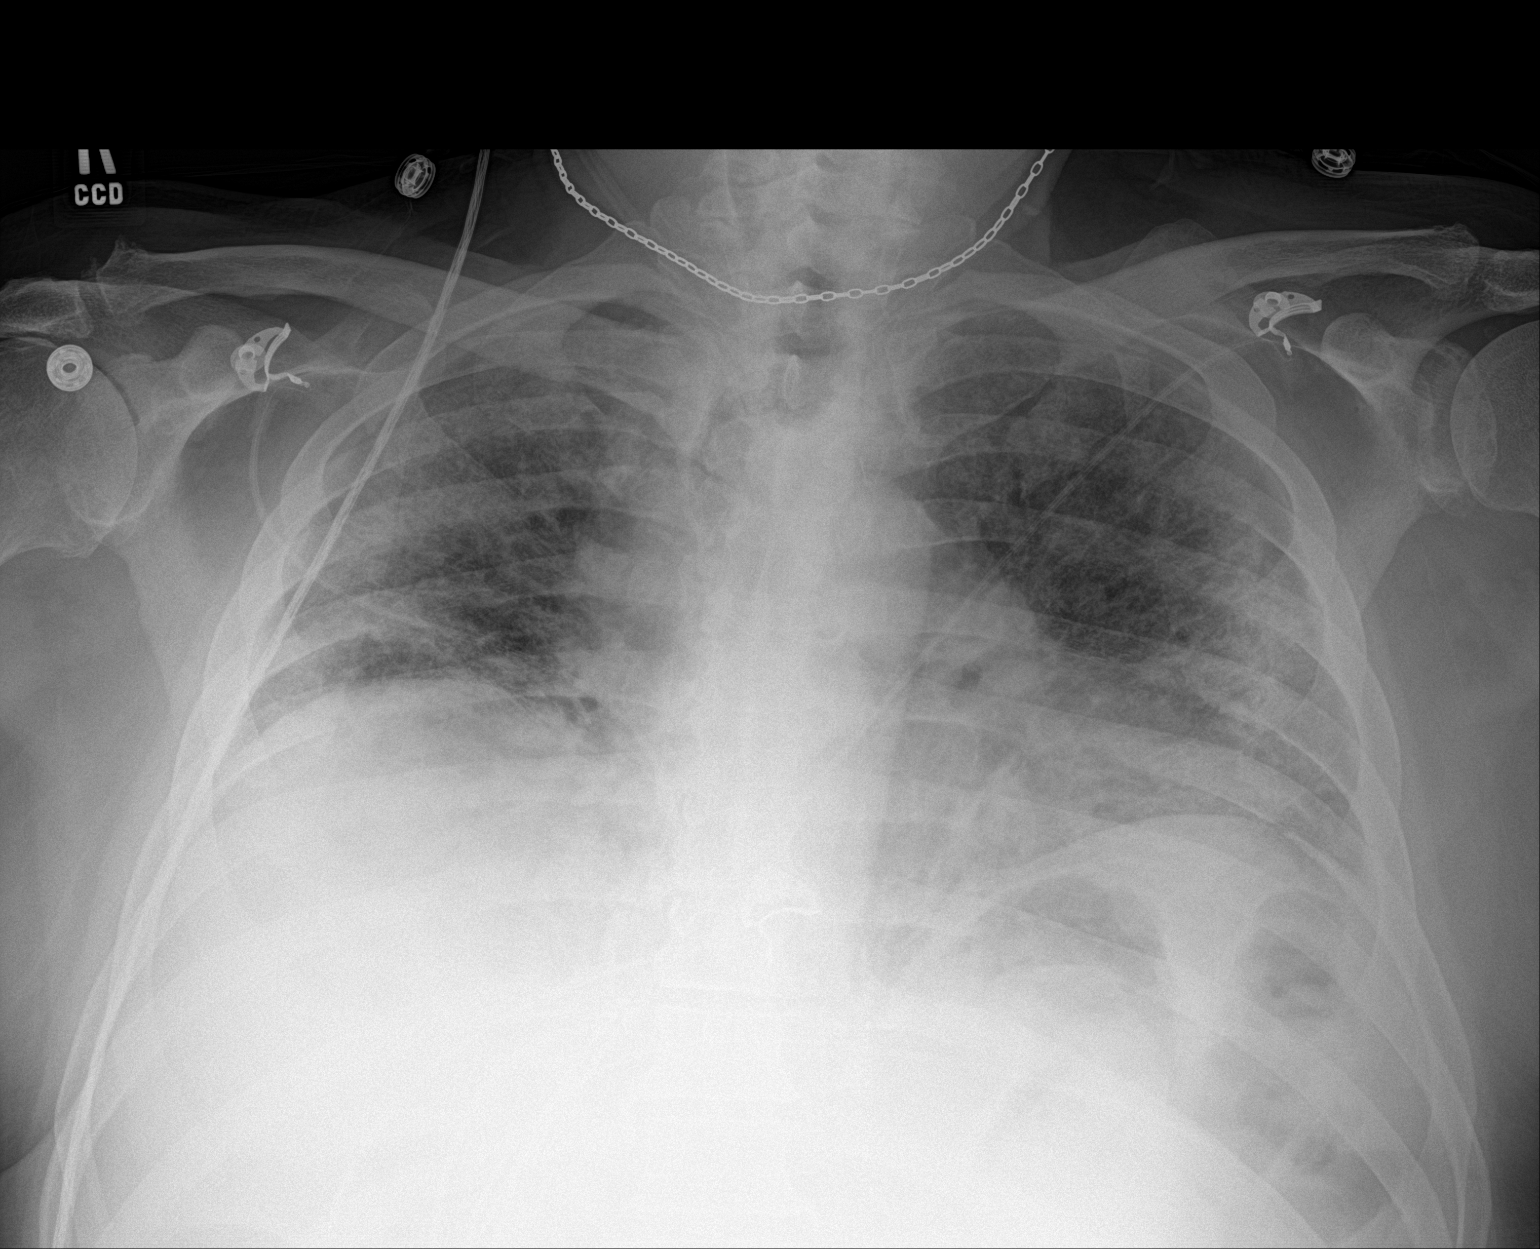

[1 of 1 positions shown; findings below may reference images not displayed]

FINDINGS: Again noted are low lung volumes with diffuse parenchymal lung
densities particularly along the peripheries. Lung opacities have
not significantly changed. Mild elevation of the right hemidiaphragm
compared to the left. Cardiomediastinal silhouette is grossly stable
but poorly characterized due to the low lung volumes.
IMPRESSION: No significant change in the bilateral lung opacities. Findings
remain compatible with pneumonia.

Low lung volumes.

## 2022-03-13 DIAGNOSIS — L578 Other skin changes due to chronic exposure to nonionizing radiation: Secondary | ICD-10-CM | POA: Diagnosis not present

## 2022-03-13 DIAGNOSIS — L814 Other melanin hyperpigmentation: Secondary | ICD-10-CM | POA: Diagnosis not present

## 2022-03-13 DIAGNOSIS — D1801 Hemangioma of skin and subcutaneous tissue: Secondary | ICD-10-CM | POA: Diagnosis not present

## 2022-03-13 DIAGNOSIS — B078 Other viral warts: Secondary | ICD-10-CM | POA: Diagnosis not present

## 2022-03-13 DIAGNOSIS — L821 Other seborrheic keratosis: Secondary | ICD-10-CM | POA: Diagnosis not present

## 2022-03-20 DIAGNOSIS — R809 Proteinuria, unspecified: Secondary | ICD-10-CM | POA: Diagnosis not present

## 2022-03-20 DIAGNOSIS — I10 Essential (primary) hypertension: Secondary | ICD-10-CM | POA: Diagnosis not present

## 2022-03-20 DIAGNOSIS — E559 Vitamin D deficiency, unspecified: Secondary | ICD-10-CM | POA: Diagnosis not present

## 2022-03-20 DIAGNOSIS — E1129 Type 2 diabetes mellitus with other diabetic kidney complication: Secondary | ICD-10-CM | POA: Diagnosis not present

## 2022-04-10 ENCOUNTER — Encounter: Payer: Self-pay | Admitting: Pulmonary Disease

## 2022-04-10 ENCOUNTER — Ambulatory Visit: Payer: BC Managed Care – PPO | Admitting: Pulmonary Disease

## 2022-04-10 VITALS — BP 138/74 | HR 88 | Temp 98.4°F | Ht 68.0 in | Wt 255.2 lb

## 2022-04-10 DIAGNOSIS — J8489 Other specified interstitial pulmonary diseases: Secondary | ICD-10-CM

## 2022-04-10 DIAGNOSIS — G4733 Obstructive sleep apnea (adult) (pediatric): Secondary | ICD-10-CM

## 2022-04-10 NOTE — Progress Notes (Signed)
Synopsis: Referred by [name], NP for respiratory failure  Subjective:   PATIENT ID: Clayton [name] GENDER: male DOB: [birth year], MRN: [id number]  HPI  Chief Complaint  Patient presents with   Follow-up    Overdue follow up for restrictive lung disease. Pt states that his breathing is about the same. Other then when he carries things or walking long distances that's when he gets winded. Pt is on Albuterol as needed and Duonebs as needed.    Clayton [name] is a 66 year old male, never smoker with history of obstructive sleep apnea, obesity, diabetes mellitus type II, asthma and respiratory failure after covid 19 pneumonia 05/2020 who returns to pulmonary clinic follow up.   He has returned to his weight prior to the covid 19 infection and is now up to 255lbs. He is not using CPAP due to issues with mask fit and moving a lot at night. He is being followed for chronic back pain by surgery.   He completed pulmonary rehab 05/31/21 and did not require supplemental oxygen with exertion.   OV 12/11/20 He has been weaned down to 1L of supplemental oxygen throughout the day and night. He received his cpap machine recently and will be starting that tonight. He continues to experience exertional dyspnea especially with stairs otherwise he reports he continues to notice some improvement in his shortness of breath.  His PFTs 10/24/20 showed showed mild restrictive defect.   Past Medical History:  Diagnosis Date   Arthritis    Asthma    pulmonary allergies- no asthma per pt   Atrophic condition of skin    Cellulitis/abscess - trunk    Cyst    shoulder   Diabetes mellitus without complication (HCC)    Hyperlipidemia    Hypertension    Hypertrophic condition of skin    Lipoma    Obesity    Pneumonia    hx child   Sleep apnea    no cpap   Stones in the urinary tract    Vitamin D deficiency      Family History  Problem Relation Age of Onset   Heart attack Father    Heart disease  Father        heart attack   Allergies Mother    Asthma Mother      Social History   Socioeconomic History   Marital status: Married    Spouse name: Not on file   Number of children: 1   Years of education: Not on file   Highest education level: Not on file  Occupational History   Occupation: Chief of Staff: [employer]  Tobacco Use   Smoking status: Never   Smokeless tobacco: Never  Substance and Sexual Activity   Alcohol use: Not Currently    Alcohol/week: 10.0 standard drinks of alcohol    Types: 10 Cans of beer per week   Drug use: No   Sexual activity: Not Currently  Other Topics Concern   Not on file  Social History Narrative   Not on file   Social Determinants of Health   Financial Resource Strain: Not on file  Food Insecurity: Not on file  Transportation Needs: Not on file  Physical Activity: Not on file  Stress: Not on file  Social Connections: Not on file  Intimate Partner Violence: Not on file     No Known Allergies   Outpatient Medications Prior to Visit  Medication Sig Dispense Refill   acetaminophen (TYLENOL) 325 MG tablet  Take 2 tablets (650 mg total) by mouth every 6 (six) hours as needed for fever.     albuterol (VENTOLIN HFA) 108 (90 Base) MCG/ACT inhaler Inhale 2 puffs into the lungs every 4 (four) hours as needed for wheezing or shortness of breath. 1 each 0   Apixaban (ELIQUIS PO) Take 1 tablet by mouth in the morning and at bedtime.     atorvastatin (LIPITOR) 40 MG tablet Take 1 tablet by mouth daily.     clonazePAM (KLONOPIN) 0.5 MG tablet Take 0.5 mg by mouth 3 (three) times daily as needed.     ipratropium-albuterol (DUONEB) 0.5-2.5 (3) MG/3ML SOLN      montelukast (SINGULAIR) 10 MG tablet Take 10 mg by mouth daily.     sertraline (ZOLOFT) 100 MG tablet Take 100 mg by mouth daily.     sitaGLIPtin (JANUVIA) 100 MG tablet Take 1 tablet by mouth daily.     metFORMIN (GLUCOPHAGE-XR) 750 MG 24 hr tablet      sodium chloride (OCEAN)  0.65 % SOLN nasal spray Place 1 spray into both nostrils as needed for congestion (nose irritation, give 1st for congestion). 30 mL 0   terbinafine (LAMISIL) 250 MG tablet Take 250 mg by mouth daily.     sildenafil (REVATIO) 20 MG tablet  (Patient not taking: Reported on 04/10/2022)     No facility-administered medications prior to visit.    Review of Systems  Constitutional:  Negative for chills, fever, malaise/fatigue and weight loss.  HENT:  Negative for congestion, sinus pain and sore throat.   Eyes: Negative.   Respiratory:  Positive for shortness of breath. Negative for cough, hemoptysis, sputum production and wheezing.   Cardiovascular:  Negative for chest pain, palpitations, orthopnea, claudication, leg swelling and PND.  Gastrointestinal:  Negative for abdominal pain, heartburn, nausea and vomiting.  Genitourinary: Negative.   Musculoskeletal: Negative.   Neurological:  Negative for dizziness, weakness and headaches.  Psychiatric/Behavioral: Negative.     Objective:   Vitals:   04/10/22 1550  BP: 138/74  Pulse: 88  Temp: 98.4 F (36.9 C)  TempSrc: Oral  SpO2: 95%  Weight: 255 lb 3.2 oz (115.8 kg)  Height: '5\' 8"'$  (1.727 m)    Physical Exam Constitutional:      General: He is not in acute distress.    Appearance: He is obese.  HENT:     Head: Normocephalic and atraumatic.     Nose: Nose normal.     Mouth/Throat:     Mouth: Mucous membranes are moist.     Pharynx: Oropharynx is clear.  Cardiovascular:     Rate and Rhythm: Normal rate and regular rhythm.     Pulses: Normal pulses.     Heart sounds: Normal heart sounds. No murmur heard. Pulmonary:     Effort: Pulmonary effort is normal.     Breath sounds: No wheezing, rhonchi or rales.  Musculoskeletal:     Right lower leg: No edema.     Left lower leg: No edema.  Skin:    General: Skin is warm and dry.     Capillary Refill: Capillary refill takes less than 2 seconds.  Neurological:     General: No focal  deficit present.     Mental Status: He is alert.  Psychiatric:        Mood and Affect: Mood normal.        Behavior: Behavior normal.        Thought Content: Thought content normal.  Judgment: Judgment normal.    CBC    Component Value Date/Time   WBC 10.3 06/18/2020 0454   RBC 3.45 (L) 06/18/2020 0454   HGB 10.6 (L) 06/18/2020 0454   HCT 33.6 (L) 06/18/2020 0454   PLT 274 06/18/2020 0454   MCV 97.4 06/18/2020 0454   MCH 30.7 06/18/2020 0454   MCHC 31.5 06/18/2020 0454   RDW 17.8 (H) 06/18/2020 0454   LYMPHSABS 1.0 05/31/2020 0423   MONOABS 0.8 05/31/2020 0423   EOSABS 0.1 05/31/2020 0423   BASOSABS 0.0 05/31/2020 0423      Latest Ref Rng & Units 06/19/2020    6:04 AM 06/18/2020    4:54 AM 06/16/2020    5:53 AM  BMP  Glucose 70 - 99 mg/dL  85  97   BUN 8 - 23 mg/dL  9  12   Creatinine 0.61 - 1.24 mg/dL  0.52  0.56   Sodium 135 - 145 mmol/L  140  141   Potassium 3.5 - 5.1 mmol/L 4.1  3.4  4.3   Chloride 98 - 111 mmol/L  102  101   CO2 22 - 32 mmol/L  30  31   Calcium 8.9 - 10.3 mg/dL  8.7  9.1    Chest imaging: HRCT Chest 04/27/21 1. The appearance of the lungs is compatible with interstitial lung disease, with a spectrum of findings considered most compatible with an alternative diagnosis (not usual interstitial pneumonia) per current ATS guidelines. 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 2. Aortic atherosclerosis, in addition to left anterior descending coronary artery disease. Please note that although the presence of coronary artery calcium documents the presence of coronary artery disease, the severity of this disease and any potential stenosis cannot be assessed on this non-gated CT examination. Assessment for potential risk factor modification, dietary therapy or pharmacologic therapy may be warranted, if clinically indicated. 3. Hepatic  steatosis.  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 No visible effusion or air leak. Stable heart size which is accentuated by technique.  PFT:    Latest Ref Rng & Units 10/24/2020   11:40 AM  PFT Results  FVC-Pre L 2.67   FVC-Predicted Pre % 61   FVC-Post L 2.66   FVC-Predicted Post % 61   Pre FEV1/FVC % % 85   Post FEV1/FCV % % 89   FEV1-Pre L 2.28   FEV1-Predicted Pre % 70   FEV1-Post L 2.36   DLCO uncorrected ml/min/mmHg 20.61   DLCO UNC% % 80   DLCO corrected ml/min/mmHg 20.61   DLCO COR %Predicted % 80   DLVA Predicted % 121   TLC L 4.20   TLC % Predicted % 63   RV % Predicted % 72   PFTs 2022: Mild restrictive defect  Echo: 05/02/20 1. Limited study with poor echo windows, Definity contrast given   2. Left ventricular ejection fraction, by estimation, is 65 to 70%. The  left ventricle has normal function. The left ventricle has no regional  wall motion abnormalities. There is mild left ventricular hypertrophy.  Left ventricular diastolic parameters  are consistent with Grade I diastolic dysfunction (impaired relaxation).   3. The aortic valve is tricuspid. Aortic valve regurgitation is not  visualized. Mild aortic valve sclerosis is present, with no evidence of  aortic valve stenosis.   Lower extremity Doppler US 04/28/20 RIGHT:  - Findings consistent with  acute deep vein thrombosis involving the right  posterior tibial veins, and right peroneal veins.  - No cystic structure found in the popliteal fossa.     LEFT:  - Findings consistent with acute deep vein thrombosis involving the left  posterior tibial veins, and left soleal veins.  - No cystic structure found in the popliteal fossa.   Sleep Study 08/27/20   Overall AHI 7.2/hr REM AHI 28.8/hr  Supine AHI 50.6/hr Min SpO2 77%  Assessment & Plan:   Organizing pneumonia ([place]) - Plan: Pulmonary Function Test  OSA (obstructive  sleep apnea)  Discussion: Clayton [name] is a 66 year old male, never smoker with history of obstructive sleep apnea, obesity, diabetes mellitus type II, asthma and acute hypoxemic respiratory failure after covid 19 pneumonia 05/2020 who returns to pulmonary clinic follow up.   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   He has completed pulmonary rehab 05/2021 and did not require supplemental oxygen with ambulation.   We will repeat pulmonary function tests to monitor his lung function.  Encouraged him to work on weight loss as this will help with his on going dyspnea.   Freda Jackson, MD [facility] Office: [phone]    Current Outpatient Medications:    acetaminophen (TYLENOL) 325 MG tablet, Take 2 tablets (650 mg total) by mouth every 6 (six) hours as needed for fever., Disp: , Rfl:    albuterol (VENTOLIN HFA) 108 (90 Base) MCG/ACT inhaler, Inhale 2 puffs into the lungs every 4 (four) hours as needed for wheezing or shortness of breath., Disp: 1 each, Rfl: 0   Apixaban (ELIQUIS PO), Take 1 tablet by mouth in the morning and at bedtime., Disp: , Rfl:    atorvastatin (LIPITOR) 40 MG tablet, Take 1 tablet by mouth daily., Disp: , Rfl:    clonazePAM (KLONOPIN) 0.5 MG tablet, Take 0.5 mg by mouth 3 (three) times daily as needed., Disp: , Rfl:    ipratropium-albuterol (DUONEB) 0.5-2.5 (3) MG/3ML SOLN, , Disp: , Rfl:    montelukast (SINGULAIR) 10 MG tablet, Take 10 mg by mouth daily., Disp: , Rfl:    sertraline (ZOLOFT) 100 MG tablet, Take 100 mg by mouth daily., Disp: , Rfl:    sitaGLIPtin (JANUVIA) 100 MG tablet, Take 1 tablet by mouth daily., Disp: , Rfl:    sildenafil (REVATIO) 20 MG tablet, , Disp: , Rfl:

## 2022-04-10 NOTE — Patient Instructions (Addendum)
Continue singulair '10mg'$  daily  Continue duoneb nebulizer as needed  Use albuterol inhaler as needed  Follow up in 9 months for pulmonary function tests

## 2022-04-14 ENCOUNTER — Encounter: Payer: Self-pay | Admitting: Pulmonary Disease

## 2022-07-03 DIAGNOSIS — K6289 Other specified diseases of anus and rectum: Secondary | ICD-10-CM | POA: Diagnosis not present

## 2022-07-03 DIAGNOSIS — R0600 Dyspnea, unspecified: Secondary | ICD-10-CM | POA: Diagnosis not present

## 2022-07-03 DIAGNOSIS — K602 Anal fissure, unspecified: Secondary | ICD-10-CM | POA: Diagnosis not present

## 2022-07-03 DIAGNOSIS — Z1211 Encounter for screening for malignant neoplasm of colon: Secondary | ICD-10-CM | POA: Diagnosis not present

## 2022-08-08 DIAGNOSIS — K5730 Diverticulosis of large intestine without perforation or abscess without bleeding: Secondary | ICD-10-CM | POA: Diagnosis not present

## 2022-08-08 DIAGNOSIS — Z1211 Encounter for screening for malignant neoplasm of colon: Secondary | ICD-10-CM | POA: Diagnosis not present

## 2022-09-17 DIAGNOSIS — M5416 Radiculopathy, lumbar region: Secondary | ICD-10-CM | POA: Diagnosis not present

## 2022-10-02 DIAGNOSIS — F3341 Major depressive disorder, recurrent, in partial remission: Secondary | ICD-10-CM | POA: Diagnosis not present

## 2022-10-02 DIAGNOSIS — Z0000 Encounter for general adult medical examination without abnormal findings: Secondary | ICD-10-CM | POA: Diagnosis not present

## 2022-10-02 DIAGNOSIS — I10 Essential (primary) hypertension: Secondary | ICD-10-CM | POA: Diagnosis not present

## 2022-10-02 DIAGNOSIS — E1169 Type 2 diabetes mellitus with other specified complication: Secondary | ICD-10-CM | POA: Diagnosis not present

## 2022-10-02 DIAGNOSIS — Z23 Encounter for immunization: Secondary | ICD-10-CM | POA: Diagnosis not present

## 2022-10-02 DIAGNOSIS — R809 Proteinuria, unspecified: Secondary | ICD-10-CM | POA: Diagnosis not present

## 2022-10-02 DIAGNOSIS — E782 Mixed hyperlipidemia: Secondary | ICD-10-CM | POA: Diagnosis not present

## 2022-10-02 DIAGNOSIS — Z125 Encounter for screening for malignant neoplasm of prostate: Secondary | ICD-10-CM | POA: Diagnosis not present

## 2022-10-02 DIAGNOSIS — R972 Elevated prostate specific antigen [PSA]: Secondary | ICD-10-CM | POA: Diagnosis not present

## 2022-10-02 DIAGNOSIS — E559 Vitamin D deficiency, unspecified: Secondary | ICD-10-CM | POA: Diagnosis not present

## 2022-10-02 DIAGNOSIS — F411 Generalized anxiety disorder: Secondary | ICD-10-CM | POA: Diagnosis not present

## 2022-10-10 DIAGNOSIS — N5201 Erectile dysfunction due to arterial insufficiency: Secondary | ICD-10-CM | POA: Diagnosis not present

## 2022-10-10 DIAGNOSIS — R972 Elevated prostate specific antigen [PSA]: Secondary | ICD-10-CM | POA: Diagnosis not present

## 2022-10-10 DIAGNOSIS — N401 Enlarged prostate with lower urinary tract symptoms: Secondary | ICD-10-CM | POA: Diagnosis not present

## 2022-10-10 DIAGNOSIS — R3915 Urgency of urination: Secondary | ICD-10-CM | POA: Diagnosis not present

## 2022-10-28 DIAGNOSIS — M25551 Pain in right hip: Secondary | ICD-10-CM | POA: Diagnosis not present

## 2022-11-06 DIAGNOSIS — M25551 Pain in right hip: Secondary | ICD-10-CM | POA: Diagnosis not present

## 2022-11-27 DIAGNOSIS — N401 Enlarged prostate with lower urinary tract symptoms: Secondary | ICD-10-CM | POA: Diagnosis not present

## 2022-11-27 DIAGNOSIS — R3915 Urgency of urination: Secondary | ICD-10-CM | POA: Diagnosis not present

## 2022-11-27 DIAGNOSIS — R972 Elevated prostate specific antigen [PSA]: Secondary | ICD-10-CM | POA: Diagnosis not present

## 2022-12-14 DIAGNOSIS — M5416 Radiculopathy, lumbar region: Secondary | ICD-10-CM | POA: Diagnosis not present

## 2022-12-25 DIAGNOSIS — M1611 Unilateral primary osteoarthritis, right hip: Secondary | ICD-10-CM | POA: Diagnosis not present

## 2023-01-15 ENCOUNTER — Telehealth: Payer: Self-pay | Admitting: *Deleted

## 2023-01-15 ENCOUNTER — Encounter (HOSPITAL_COMMUNITY): Payer: Self-pay

## 2023-01-15 NOTE — Telephone Encounter (Signed)
Pt is scheduled to see Dr. Flora Lipps 02/13/23, clearance will be addressed at that time.  Will route to the requesting surgeon's office to make them aware.

## 2023-01-15 NOTE — Telephone Encounter (Signed)
Primary Cardiologist:[place] Cleophus Molt, MD  Chart reviewed as part of pre-operative protocol coverage. Because of Clayton [name]'s past medical history and time since last visit, he/she will require a follow-up visit in order to better assess preoperative cardiovascular risk.  Pre-op covering staff: - Please schedule appointment and call patient to inform them (last office visit 01/12/2021).  - Please contact requesting surgeon's office via preferred method (i.e, phone, fax) to inform them of need for appointment prior to surgery.  Patient's OAC is not prescribed for cardiac indication and is not managed by cardiology.  Clayton Aland, NP-C  01/15/2023, 11:50 AM [address] Office [phone] Fax [phone]

## 2023-01-15 NOTE — Telephone Encounter (Signed)
   Pre-operative Risk Assessment    Patient Name: Clayton [name]  DOB: November 29, 1955 MRN: [id number]      Request for Surgical Clearance    Procedure:   RIGHT TOTAL HIP ARTHROPLASTY  Date of Surgery:  Clearance 03/04/23                                 Surgeon:  DR. MATTHEW OLIN Surgeon's Group or Practice Name:  [facility] Phone number:  [phone] ATTN: Rosalva Ferron Fax number:  [phone]   Type of Clearance Requested:   - Medical  - Pharmacy:  Hold Apixaban (Eliquis)     Type of Anesthesia:  Spinal   Additional requests/questions:    Elpidio Anis   01/15/2023, 10:11 AM

## 2023-01-15 NOTE — Telephone Encounter (Signed)
Left message to call back to schedule an in office appt for pre op clearance.

## 2023-01-16 ENCOUNTER — Telehealth: Payer: Self-pay | Admitting: Pulmonary Disease

## 2023-01-16 NOTE — Telephone Encounter (Signed)
Fax received from Dr. Earleen Reaper with [facility] to perform a Right Total Hip Arthroplasty on patient.  Patient needs surgery clearance. Surgery is 03/04/23. Patient was seen on 04/10/22. Office protocol is a risk assessment can be sent to surgeon if patient has been seen in 60 days or less.   Sending to Dr. Francine Graven for risk assessment or recommendations if patient needs to be seen in office prior to surgical procedure.    Dr. Francine Graven patient has OV scheduled on 02/25/23

## 2023-01-17 NOTE — Telephone Encounter (Signed)
Noted, thanks!

## 2023-02-11 DIAGNOSIS — Z01818 Encounter for other preprocedural examination: Secondary | ICD-10-CM | POA: Diagnosis not present

## 2023-02-11 DIAGNOSIS — E119 Type 2 diabetes mellitus without complications: Secondary | ICD-10-CM | POA: Diagnosis not present

## 2023-02-11 DIAGNOSIS — J9611 Chronic respiratory failure with hypoxia: Secondary | ICD-10-CM | POA: Diagnosis not present

## 2023-02-11 DIAGNOSIS — J8410 Pulmonary fibrosis, unspecified: Secondary | ICD-10-CM | POA: Diagnosis not present

## 2023-02-11 DIAGNOSIS — I700 Atherosclerosis of aorta: Secondary | ICD-10-CM | POA: Diagnosis not present

## 2023-02-11 DIAGNOSIS — I5189 Other ill-defined heart diseases: Secondary | ICD-10-CM | POA: Diagnosis not present

## 2023-02-11 DIAGNOSIS — F3341 Major depressive disorder, recurrent, in partial remission: Secondary | ICD-10-CM | POA: Diagnosis not present

## 2023-02-11 DIAGNOSIS — E1169 Type 2 diabetes mellitus with other specified complication: Secondary | ICD-10-CM | POA: Diagnosis not present

## 2023-02-11 NOTE — Progress Notes (Unsigned)
Cardiology Office Note:   Date:  02/11/2023  NAME:  Clayton [name]    MRN: [id number] DOB:  Apr 01, 1956   PCP:  Soundra Pilon, FNP  Cardiologist:  Reatha Harps, MD  Electrophysiologist:  None   Referring MD: Soundra Pilon, FNP   No chief complaint on file.   History of Present Illness:   Clayton [name] is a 67 y.o. male with a hx of restrictive lung disease, OSA, DVT, who presents for preoperative assessment.   ***  Problem List 1. DM -A1c 6.1 2. OSA 3. Covid 19 PNA/restrictive lung dz -admit 05/2020 for severe PNA/covid 4. 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 HLD -T chol 169, HDL 32, LDL 112, TG 123  Past Medical History: Past Medical History:  Diagnosis Date   Arthritis    Asthma    pulmonary allergies- no asthma per pt   Atrophic condition of skin    Cellulitis/abscess - trunk    Cyst    shoulder   Diabetes mellitus without complication (HCC)    Hyperlipidemia    Hypertension    Hypertrophic condition of skin    Lipoma    Obesity    Pneumonia    hx child   Sleep apnea    no cpap   Stones in the urinary tract    Vitamin D deficiency     Past Surgical History: Past Surgical History:  Procedure Laterality Date   cyst removed from tailbone  1980   INSERTION OF MESH N/A 05/23/2015   Procedure: INSERTION OF MESH;  Surgeon: Axel Filler, MD;  Location: [facility] OR;  Service: General;  Laterality: N/A;   MASS EXCISION  07/15/11   left shoulder mass    NASAL SINUS SURGERY---UVULA REMOVED  2004   Dr. Annalee Genta   PROSTATE BIOPSY  9/16   TONSILLECTOMY     UMBILICAL HERNIA REPAIR N/A 05/23/2015   Procedure: LAPAROSCOPIC UMBILICAL HERNIA REPAIR ;  Surgeon: Axel Filler, MD;  Location: [facility] OR;  Service: General;  Laterality: N/A;    Current Medications: No outpatient medications have been marked as taking for the 02/13/23 encounter (Appointment) with Sande Rives, MD.     Allergies:    Patient has no known allergies.   Social History: Social  History   Socioeconomic History   Marital status: Married    Spouse name: Not on file   Number of children: 1   Years of education: Not on file   Highest education level: Not on file  Occupational History   Occupation: Advice worker: [employer]  Tobacco Use   Smoking status: Never   Smokeless tobacco: Never  Substance and Sexual Activity   Alcohol use: Not Currently    Alcohol/week: 10.0 standard drinks of alcohol    Types: 10 Cans of beer per week   Drug use: No   Sexual activity: Not Currently  Other Topics Concern   Not on file  Social History Narrative   Not on file   Social Determinants of Health   Financial Resource Strain: Not on file  Food Insecurity: Not on file  Transportation Needs: Not on file  Physical Activity: Not on file  Stress: Not on file  Social Connections: Not on file     Family History: The patient's family history includes Allergies in his mother; Asthma in his mother; Heart attack in his father; Heart disease in his father.  ROS:   All other ROS reviewed and negative. Pertinent positives noted in the  HPI.     EKGs/Labs/Other Studies Reviewed:   The following studies were personally reviewed by me today:  EKG:  EKG is *** ordered today.        TTE 08/16/2020  1. Left ventricular ejection fraction, by estimation, is 60 to 65%. The  left ventricle has normal function. The left ventricle has no regional  wall motion abnormalities. Left ventricular diastolic parameters are  consistent with Grade I diastolic  dysfunction (impaired relaxation).   2. Right ventricular systolic function is normal. The right ventricular  size is mildly enlarged. There is normal pulmonary artery systolic  pressure. The estimated right ventricular systolic pressure is 24.2 mmHg.   3. The mitral valve is normal in structure. Trivial mitral valve  regurgitation.   4. The aortic valve is tricuspid. There is mild calcification of the  aortic valve. There  is mild thickening of the aortic valve. Aortic valve  regurgitation is not visualized.   Recent Labs: No results found for requested labs within last 365 days.   Recent Lipid Panel    Component Value Date/Time   CHOL 169 04/25/2020 0950   TRIG 123 04/25/2020 0950   HDL 32 (L) 04/25/2020 0950   CHOLHDL 5.3 04/25/2020 0950   VLDL 25 04/25/2020 0950   LDLCALC 112 (H) 04/25/2020 0950   LDLDIRECT 162.1 12/21/2007 0000    Physical Exam:   VS:  There were no vitals taken for this visit.   Wt Readings from Last 3 Encounters:  04/10/22 255 lb 3.2 oz (115.8 kg)  10/18/21 244 lb 3.2 oz (110.8 kg)  05/31/21 237 lb 14 oz (107.9 kg)    General: Well nourished, well developed, in no acute distress Head: Atraumatic, normal size  Eyes: PEERLA, EOMI  Neck: Supple, no JVD Endocrine: No thryomegaly Cardiac: Normal S1, S2; RRR; no murmurs, rubs, or gallops Lungs: Clear to auscultation bilaterally, no wheezing, rhonchi or rales  Abd: Soft, nontender, no hepatomegaly  Ext: No edema, pulses 2+ Musculoskeletal: No deformities, BUE and BLE strength normal and equal Skin: Warm and dry, no rashes   Neuro: Alert and oriented to person, place, time, and situation, CNII-XII grossly intact, no focal deficits  Psych: Normal mood and affect   ASSESSMENT:   Clayton [name] is a 67 y.o. male who presents for the following: No diagnosis found.  PLAN:   There are no diagnoses linked to this encounter.  {Are you ordering a CV Procedure (e.g. stress test, cath, DCCV, TEE, etc)?   Press F2        :[id number]}  Disposition: No follow-ups on file.  Medication Adjustments/Labs and Tests Ordered: Current medicines are reviewed at length with the patient today.  Concerns regarding medicines are outlined above.  No orders of the defined types were placed in this encounter.  No orders of the defined types were placed in this encounter.  There are no Patient Instructions on file for this visit.   Time Spent  with Patient: I have spent a total of *** minutes with patient reviewing hospital notes, telemetry, EKGs, labs and examining the patient as well as establishing an assessment and plan that was discussed with the patient.  > 50% of time was spent in direct patient care.  Signed, Lenna Gilford. Flora Lipps, MD, [facility]  [facility]  [address] [place], [place] [address] [phone]  02/11/2023 6:29 PM

## 2023-02-13 ENCOUNTER — Ambulatory Visit: Payer: PPO | Attending: Cardiovascular Disease | Admitting: Cardiovascular Disease

## 2023-02-13 ENCOUNTER — Encounter: Payer: Self-pay | Admitting: Cardiovascular Disease

## 2023-02-13 VITALS — BP 128/80 | HR 73 | Ht 68.0 in | Wt 249.0 lb

## 2023-02-13 DIAGNOSIS — Z01810 Encounter for preprocedural cardiovascular examination: Secondary | ICD-10-CM

## 2023-02-13 DIAGNOSIS — E782 Mixed hyperlipidemia: Secondary | ICD-10-CM | POA: Diagnosis not present

## 2023-02-13 NOTE — Patient Instructions (Signed)
Medication Instructions:  Your physician recommends that you continue on your current medications as directed. Please refer to the Current Medication list given to you today.  *If you need a refill on your cardiac medications before your next appointment, please call your pharmacy*   Follow-Up: At [facility], you and your health needs are our priority.  As part of our continuing mission to provide you with exceptional heart care, we have created designated Provider Care Teams.  These Care Teams include your primary Cardiologist (physician) and Advanced Practice Providers (APPs -  Physician Assistants and Nurse Practitioners) who all work together to provide you with the care you need, when you need it.  We recommend signing up for the patient portal called "MyChart".  Sign up information is provided on this After Visit Summary.  MyChart is used to connect with patients for Virtual Visits (Telemedicine).  Patients are able to view lab/test results, encounter notes, upcoming appointments, etc.  Non-urgent messages can be sent to your provider as well.   To learn more about what you can do with MyChart, go to [email or web address].    Your next appointment:   12 month(s)  Provider:   Reatha Harps, MD

## 2023-02-18 DIAGNOSIS — U071 COVID-19: Secondary | ICD-10-CM | POA: Diagnosis not present

## 2023-02-18 DIAGNOSIS — G4733 Obstructive sleep apnea (adult) (pediatric): Secondary | ICD-10-CM | POA: Diagnosis not present

## 2023-02-18 NOTE — Progress Notes (Signed)
COVID Vaccine Completed:  Date of COVID positive in last 90 days:  PCP - Peri Maris, FNP Cardiologist - Lennie Odor, MD LOV 02/13/23  Cardiac clearance by Lennie Odor 02/13/23 in Epic  Chest x-ray -  EKG - 02/13/23 Epic Stress Test -  ECHO - 08/16/20 Epic Cardiac Cath -  Pacemaker/ICD device last checked: Spinal Cord Stimulator:  Bowel Prep -   Sleep Study -  CPAP -   Fasting Blood Sugar -  Checks Blood Sugar _____ times a day  Last dose of GLP1 agonist-  N/A GLP1 instructions:  N/A   Last dose of SGLT-2 inhibitors-  N/A SGLT-2 instructions: N/A   Blood Thinner Instructions:  Time Aspirin Instructions: Last Dose:  Activity level:  Can go up a flight of stairs and perform activities of daily living without stopping and without symptoms of chest pain or shortness of breath.  Able to exercise without symptoms  Unable to go up a flight of stairs without symptoms of     Anesthesia review: restrictive lung disease, OSA, HTN, DM2, DVT  Patient denies shortness of breath, fever, cough and chest pain at PAT appointment  Patient verbalized understanding of instructions that were given to them at the PAT appointment. Patient was also instructed that they will need to review over the PAT instructions again at home before surgery.

## 2023-02-18 NOTE — Patient Instructions (Signed)
SURGICAL WAITING ROOM VISITATION  Patients having surgery or a procedure may have no more than 2 support people in the waiting area - these visitors may rotate.    Children under the age of 59 must have an adult with them who is not the patient.  Due to an increase in RSV and influenza rates and associated hospitalizations, children ages 41 and under may not visit patients in [facility] hospitals.  If the patient needs to stay at the hospital during part of their recovery, the visitor guidelines for inpatient rooms apply. Pre-op nurse will coordinate an appropriate time for 1 support person to accompany patient in pre-op.  This support person may not rotate.    Please refer to the [facility] website for the visitor guidelines for Inpatients (after your surgery is over and you are in a regular room).    Your procedure is scheduled on: 03/04/23   Report to [facility]    Report to admitting at 2:10 PM   Call this number if you have problems the morning of surgery [phone]   Do not eat food :After Midnight.   After Midnight you may have the following liquids until 1:40 PM DAY OF SURGERY  Water Non-Citrus Juices (without pulp, NO RED-Apple, White grape, White cranberry) Black Coffee (NO MILK/CREAM OR CREAMERS, sugar ok)  Clear Tea (NO MILK/CREAM OR CREAMERS, sugar ok) regular and decaf                             Plain Jell-O (NO RED)                                           Fruit ices (not with fruit pulp, NO RED)                                     Popsicles (NO RED)                                                               Sports drinks like Gatorade (NO RED)               The day of surgery:  Drink ONE (1) Pre-Surgery G2 at 1:40 PM the morning of surgery. Drink in one sitting. Do not sip.  This drink was given to you during your hospital  pre-op appointment visit. Nothing else to drink after completing the  Pre-Surgery G2.          If you have  questions, please contact your surgeon's office.   FOLLOW BOWEL PREP AND ANY ADDITIONAL PRE OP INSTRUCTIONS YOU RECEIVED FROM YOUR SURGEON'S OFFICE!!!     Oral Hygiene is also important to reduce your risk of infection.                                    Remember - BRUSH YOUR TEETH THE MORNING OF SURGERY WITH YOUR REGULAR TOOTHPASTE  DENTURES WILL BE REMOVED PRIOR TO  SURGERY PLEASE DO NOT APPLY "Poly grip" OR ADHESIVES!!!   Take these medicines the morning of surgery with A SIP OF WATER: Albuterol, Atorvastatin, Clonazepam, Duoneb, Percocet, Sertraline, Tamsulosin  DO NOT TAKE ANY ORAL DIABETIC MEDICATIONS DAY OF YOUR SURGERY  How to Manage Your Diabetes Before and After Surgery  Why is it important to control my blood sugar before and after surgery? Improving blood sugar levels before and after surgery helps healing and can limit problems. A way of improving blood sugar control is eating a healthy diet by:  Eating less sugar and carbohydrates  Increasing activity/exercise  Talking with your doctor about reaching your blood sugar goals High blood sugars (greater than 180 mg/dL) can raise your risk of infections and slow your recovery, so you will need to focus on controlling your diabetes during the weeks before surgery. Make sure that the doctor who takes care of your diabetes knows about your planned surgery including the date and location.  How do I manage my blood sugar before surgery? Check your blood sugar at least 4 times a day, starting 2 days before surgery, to make sure that the level is not too high or low. Check your blood sugar the morning of your surgery when you wake up and every 2 hours until you get to the Short Stay unit. If your blood sugar is less than 70 mg/dL, you will need to treat for low blood sugar: Do not take insulin. Treat a low blood sugar (less than 70 mg/dL) with  cup of clear juice (cranberry or apple), 4 glucose tablets, OR glucose gel. Recheck  blood sugar in 15 minutes after treatment (to make sure it is greater than 70 mg/dL). If your blood sugar is not greater than 70 mg/dL on recheck, call [phone] for further instructions. Report your blood sugar to the short stay nurse when you get to Short Stay.  If you are admitted to the hospital after surgery: Your blood sugar will be checked by the staff and you will probably be given insulin after surgery (instead of oral diabetes medicines) to make sure you have good blood sugar levels. The goal for blood sugar control after surgery is 80-180 mg/dL.   WHAT DO I DO ABOUT MY DIABETES MEDICATION?  Do not take oral diabetes medicines (pills) the morning of surgery.  THE DAY BEFORE SURGERY, take Metformin as prescribed.      THE MORNING OF SURGERY, do not take Metformin.  Reviewed and Endorsed by [facility] Patient Education Committee, August 2015  Bring CPAP mask and tubing day of surgery.                              You may not have any metal on your body including jewelry, and body piercing             Do not wear lotions, powders, cologne, or deodorant              Men may shave face and neck.   Do not bring valuables to the hospital. [facility] IS NOT             RESPONSIBLE   FOR VALUABLES.   Contacts, glasses, dentures or bridgework may not be worn into surgery.   Bring small overnight bag day of surgery.   DO NOT BRING YOUR HOME MEDICATIONS TO THE HOSPITAL. PHARMACY WILL DISPENSE MEDICATIONS LISTED ON YOUR MEDICATION LIST TO YOU DURING YOUR  ADMISSION IN THE HOSPITAL!    Patients discharged on the day of surgery will not be allowed to drive home.  Someone NEEDS to stay with you for the first 24 hours after anesthesia.              Please read over the following fact sheets you were given: IF YOU HAVE QUESTIONS ABOUT YOUR PRE-OP INSTRUCTIONS PLEASE CALL [name]   If you received a COVID test during your pre-op visit  it is requested that you wear a  mask when out in public, stay away from anyone that may not be feeling well and notify your surgeon if you develop symptoms. If you test positive for Covid or have been in contact with anyone that has tested positive in the last 10 days please notify you surgeon.      Pre-operative 5 CHG Bath Instructions   You can play a key role in reducing the risk of infection after surgery. Your skin needs to be as free of germs as possible. You can reduce the number of germs on your skin by washing with CHG (chlorhexidine gluconate) soap before surgery. CHG is an antiseptic soap that kills germs and continues to kill germs even after washing.   DO NOT use if you have an allergy to chlorhexidine/CHG or antibacterial soaps. If your skin becomes reddened or irritated, stop using the CHG and notify one of our RNs at [phone].   Please shower with the CHG soap starting 4 days before surgery using the following schedule:     Please keep in mind the following:  DO NOT shave, including legs and underarms, starting the day of your first shower.   You may shave your face at any point before/day of surgery.  Place clean sheets on your bed the day you start using CHG soap. Use a clean washcloth (not used since being washed) for each shower. DO NOT sleep with pets once you start using the CHG.   CHG Shower Instructions:  If you choose to wash your hair and private area, wash first with your normal shampoo/soap.  After you use shampoo/soap, rinse your hair and body thoroughly to remove shampoo/soap residue.  Turn the water OFF and apply about 3 tablespoons (45 ml) of CHG soap to a CLEAN washcloth.  Apply CHG soap ONLY FROM YOUR NECK DOWN TO YOUR TOES (washing for 3-5 minutes)  DO NOT use CHG soap on face, private areas, open wounds, or sores.  Pay special attention to the area where your surgery is being performed.  If you are having back surgery, having someone wash your back for you may be helpful. Wait 2  minutes after CHG soap is applied, then you may rinse off the CHG soap.  Pat dry with a clean towel  Put on clean clothes/pajamas   If you choose to wear lotion, please use ONLY the CHG-compatible lotions on the back of this paper.     Additional instructions for the day of surgery: DO NOT APPLY any lotions, deodorants, cologne, or perfumes.   Put on clean/comfortable clothes.  Brush your teeth.  Ask your nurse before applying any prescription medications to the skin.      CHG Compatible Lotions   Aveeno Moisturizing lotion  Cetaphil Moisturizing Cream  Cetaphil Moisturizing Lotion  Clairol Herbal Essence Moisturizing Lotion, Dry Skin  Clairol Herbal Essence Moisturizing Lotion, Extra Dry Skin  Clairol Herbal Essence Moisturizing Lotion, Normal Skin  Curel Age Defying Therapeutic Moisturizing Lotion with Alpha  Hydroxy  Curel Extreme Care Body Lotion  Curel Soothing Hands Moisturizing Hand Lotion  Curel Therapeutic Moisturizing Cream, Fragrance-Free  Curel Therapeutic Moisturizing Lotion, Fragrance-Free  Curel Therapeutic Moisturizing Lotion, Original Formula  Eucerin Daily Replenishing Lotion  Eucerin Dry Skin Therapy Plus Alpha Hydroxy Crme  Eucerin Dry Skin Therapy Plus Alpha Hydroxy Lotion  Eucerin Original Crme  Eucerin Original Lotion  Eucerin Plus Crme Eucerin Plus Lotion  Eucerin TriLipid Replenishing Lotion  Keri Anti-Bacterial Hand Lotion  Keri Deep Conditioning Original Lotion Dry Skin Formula Softly Scented  Keri Deep Conditioning Original Lotion, Fragrance Free Sensitive Skin Formula  Keri Lotion Fast Absorbing Fragrance Free Sensitive Skin Formula  Keri Lotion Fast Absorbing Softly Scented Dry Skin Formula  Keri Original Lotion  Keri Skin Renewal Lotion Keri Silky Smooth Lotion  Keri Silky Smooth Sensitive Skin Lotion  Nivea Body Creamy Conditioning Oil  Nivea Body Extra Enriched Teacher, adult education Moisturizing Lotion  Nivea Crme  Nivea Skin Firming Lotion  NutraDerm 30 Skin Lotion  NutraDerm Skin Lotion  NutraDerm Therapeutic Skin Cream  NutraDerm Therapeutic Skin Lotion  ProShield Protective Hand Cream  Provon moisturizing lotion  WHAT IS A BLOOD TRANSFUSION? Blood Transfusion Information  A transfusion is the replacement of blood or some of its parts. Blood is made up of multiple cells which provide different functions. Red blood cells carry oxygen and are used for blood loss replacement. White blood cells fight against infection. Platelets control bleeding. Plasma helps clot blood. Other blood products are available for specialized needs, such as hemophilia or other clotting disorders. BEFORE THE TRANSFUSION  Who gives blood for transfusions?  Healthy volunteers who are fully evaluated to make sure their blood is safe. This is blood bank blood. Transfusion therapy is the safest it has ever been in the practice of medicine. Before blood is taken from a donor, a complete history is taken to make sure that person has no history of diseases nor engages in risky social behavior (examples are intravenous drug use or sexual activity with multiple partners). The donor's travel history is screened to minimize risk of transmitting infections, such as malaria. The donated blood is tested for signs of infectious diseases, such as HIV and hepatitis. The blood is then tested to be sure it is compatible with you in order to minimize the chance of a transfusion reaction. If you or a relative donates blood, this is often done in anticipation of surgery and is not appropriate for emergency situations. It takes many days to process the donated blood. RISKS AND COMPLICATIONS Although transfusion therapy is very safe and saves many lives, the main dangers of transfusion include:  Getting an infectious disease. Developing a transfusion reaction. This is an allergic reaction to something in the blood you were given. Every  precaution is taken to prevent this. The decision to have a blood transfusion has been considered carefully by your caregiver before blood is given. Blood is not given unless the benefits outweigh the risks. AFTER THE TRANSFUSION Right after receiving a blood transfusion, you will usually feel much better and more energetic. This is especially true if your red blood cells have gotten low (anemic). The transfusion raises the level of the red blood cells which carry oxygen, and this usually causes an energy increase. The nurse administering the transfusion will monitor you carefully for complications. HOME CARE INSTRUCTIONS  No special instructions are needed after a transfusion. You may find your energy is better. Speak with  your caregiver about any limitations on activity for underlying diseases you may have. SEEK MEDICAL CARE IF:  Your condition is not improving after your transfusion. You develop redness or irritation at the intravenous (IV) site. SEEK IMMEDIATE MEDICAL CARE IF:  Any of the following symptoms occur over the next 12 hours: Shaking chills. You have a temperature by mouth above 102 F (38.9 C), not controlled by medicine. Chest, back, or muscle pain. People around you feel you are not acting correctly or are confused. Shortness of breath or difficulty breathing. Dizziness and fainting. You get a rash or develop hives. You have a decrease in urine output. Your urine turns a dark color or changes to pink, red, or brown. Any of the following symptoms occur over the next 10 days: You have a temperature by mouth above 102 F (38.9 C), not controlled by medicine. Shortness of breath. Weakness after normal activity. The white part of the eye turns yellow (jaundice). You have a decrease in the amount of urine or are urinating less often. Your urine turns a dark color or changes to pink, red, or brown. Document Released: 07/19/2000 Document Revised: 10/14/2011 Document Reviewed:  03/07/2008 Exit[employer] Patient Information 2014 [place], [place].  _______________________________________________________________________  Incentive Spirometer  An incentive spirometer is a tool that can help keep your lungs clear and active. This tool measures how well you are filling your lungs with each breath. Taking long deep breaths may help reverse or decrease the chance of developing breathing (pulmonary) problems (especially infection) following: A long period of time when you are unable to move or be active. BEFORE THE PROCEDURE  If the spirometer includes an indicator to show your best effort, your nurse or respiratory therapist will set it to a desired goal. If possible, sit up straight or lean slightly forward. Try not to slouch. Hold the incentive spirometer in an upright position. INSTRUCTIONS FOR USE  Sit on the edge of your bed if possible, or sit up as far as you can in bed or on a chair. Hold the incentive spirometer in an upright position. Breathe out normally. Place the mouthpiece in your mouth and seal your lips tightly around it. Breathe in slowly and as deeply as possible, raising the piston or the ball toward the top of the column. Hold your breath for 3-5 seconds or for as long as possible. Allow the piston or ball to fall to the bottom of the column. Remove the mouthpiece from your mouth and breathe out normally. Rest for a few seconds and repeat Steps 1 through 7 at least 10 times every 1-2 hours when you are awake. Take your time and take a few normal breaths between deep breaths. The spirometer may include an indicator to show your best effort. Use the indicator as a goal to work toward during each repetition. After each set of 10 deep breaths, practice coughing to be sure your lungs are clear. If you have an incision (the cut made at the time of surgery), support your incision when coughing by placing a pillow or rolled up towels firmly against it. Once you are  able to get out of bed, walk around indoors and cough well. You may stop using the incentive spirometer when instructed by your caregiver.  RISKS AND COMPLICATIONS Take your time so you do not get dizzy or light-headed. If you are in pain, you may need to take or ask for pain medication before doing incentive spirometry. It is harder to take a deep  breath if you are having pain. AFTER USE Rest and breathe slowly and easily. It can be helpful to keep track of a log of your progress. Your caregiver can provide you with a simple table to help with this. If you are using the spirometer at home, follow these instructions: SEEK MEDICAL CARE IF:  You are having difficultly using the spirometer. You have trouble using the spirometer as often as instructed. Your pain medication is not giving enough relief while using the spirometer. You develop fever of 100.5 F (38.1 C) or higher. SEEK IMMEDIATE MEDICAL CARE IF:  You cough up bloody sputum that had not been present before. You develop fever of 102 F (38.9 C) or greater. You develop worsening pain at or near the incision site. MAKE SURE YOU:  Understand these instructions. Will watch your condition. Will get help right away if you are not doing well or get worse. Document Released: 12/02/2006 Document Revised: 10/14/2011 Document Reviewed: 02/02/2007 [facility] Patient Information 2014 [place], [place].   ________________________________________________________________________

## 2023-02-19 ENCOUNTER — Encounter (HOSPITAL_COMMUNITY): Payer: Self-pay

## 2023-02-19 ENCOUNTER — Other Ambulatory Visit: Payer: Self-pay

## 2023-02-19 ENCOUNTER — Encounter (HOSPITAL_COMMUNITY)
Admission: RE | Admit: 2023-02-19 | Discharge: 2023-02-19 | Disposition: A | Discharge status: Home / Self Care | Payer: PPO | Source: Ambulatory Visit | Attending: Orthopedic Surgery | Admitting: Orthopedic Surgery

## 2023-02-19 VITALS — BP 123/76 | HR 64 | Temp 98.7°F | Resp 18 | Ht 69.0 in | Wt 245.0 lb

## 2023-02-19 DIAGNOSIS — E1165 Type 2 diabetes mellitus with hyperglycemia: Secondary | ICD-10-CM | POA: Diagnosis not present

## 2023-02-19 DIAGNOSIS — M1611 Unilateral primary osteoarthritis, right hip: Secondary | ICD-10-CM | POA: Insufficient documentation

## 2023-02-19 DIAGNOSIS — Z01812 Encounter for preprocedural laboratory examination: Secondary | ICD-10-CM | POA: Insufficient documentation

## 2023-02-19 DIAGNOSIS — Z01818 Encounter for other preprocedural examination: Secondary | ICD-10-CM

## 2023-02-19 HISTORY — DX: Anxiety disorder, unspecified: F41.9

## 2023-02-19 HISTORY — DX: Depression, unspecified: F32.A

## 2023-02-19 HISTORY — DX: Chronic obstructive pulmonary disease, unspecified: J44.9

## 2023-02-19 LAB — CBC
HCT: 43 % (ref 39.0–52.0)
Hemoglobin: 14 g/dL (ref 13.0–17.0)
MCH: 30.6 pg (ref 26.0–34.0)
MCHC: 32.6 g/dL (ref 30.0–36.0)
MCV: 93.9 fL (ref 80.0–100.0)
Platelets: 301 10*3/uL (ref 150–400)
RBC: 4.58 MIL/uL (ref 4.22–5.81)
RDW: 12.8 % (ref 11.5–15.5)
WBC: 9.1 10*3/uL (ref 4.0–10.5)
nRBC: 0 % (ref 0.0–0.2)

## 2023-02-19 LAB — SURGICAL PCR SCREEN
MRSA, PCR: NEGATIVE
Staphylococcus aureus: NEGATIVE

## 2023-02-19 LAB — BASIC METABOLIC PANEL
Anion gap: 9 (ref 5–15)
BUN: 20 mg/dL (ref 8–23)
CO2: 24 mmol/L (ref 22–32)
Calcium: 9.1 mg/dL (ref 8.9–10.3)
Chloride: 105 mmol/L (ref 98–111)
Creatinine, Ser: 0.8 mg/dL (ref 0.61–1.24)
GFR, Estimated: >60 mL/min (ref >60)
Glucose, Bld: 142 mg/dL — ABNORMAL HIGH (ref 70–99)
Potassium: 4.1 mmol/L (ref 3.5–5.1)
Sodium: 138 mmol/L (ref 135–145)

## 2023-02-19 LAB — TYPE AND SCREEN
ABO/RH(D): O POS
Antibody Screen: NEGATIVE

## 2023-02-19 LAB — HEMOGLOBIN A1C
Hgb A1c MFr Bld: 7.6 % — ABNORMAL HIGH (ref 4.8–5.6)
Mean Plasma Glucose: 171.42 mg/dL

## 2023-02-19 LAB — GLUCOSE, CAPILLARY: Glucose-Capillary: 137 mg/dL — ABNORMAL HIGH (ref 70–99)

## 2023-02-19 NOTE — Progress Notes (Signed)
A1C 7.6, results routed to Dr. Alvan Dame.

## 2023-02-25 ENCOUNTER — Encounter (HOSPITAL_COMMUNITY): Payer: Self-pay

## 2023-02-25 ENCOUNTER — Encounter: Payer: Self-pay | Admitting: Pulmonary Disease

## 2023-02-25 ENCOUNTER — Ambulatory Visit: Payer: PPO | Admitting: Pulmonary Disease

## 2023-02-25 VITALS — BP 122/74 | HR 83 | Ht 69.0 in | Wt 243.0 lb

## 2023-02-25 DIAGNOSIS — G4733 Obstructive sleep apnea (adult) (pediatric): Secondary | ICD-10-CM

## 2023-02-25 DIAGNOSIS — J849 Interstitial pulmonary disease, unspecified: Secondary | ICD-10-CM

## 2023-02-25 NOTE — Patient Instructions (Addendum)
Ariscat Risk Index: Intermediate risk 13.3% risk of in-hospital post-op pulmonary complications (composite including respiratory failure, respiratory infection, pleural effusion, atelectasis, pneumothorax, bronchospasm, aspiration pneumonitis)  Recommend using as needed nebulizer treatments on day of procedure  Follow up in 6 months for PFTs

## 2023-02-25 NOTE — Progress Notes (Signed)
Synopsis: Referred by Peri Maris, NP for respiratory failure  Subjective:   PATIENT ID: Clayton [name] GENDER: male DOB: 1956-05-29, MRN: [id number]  HPI  Chief Complaint  Patient presents with   Follow-up    Surgical clearance for hip surgery. Currently scheduled for 07/30 with Dr. Charlann Boxer. States his breathing has been stable since last visit.    Clayton [name] is a 67 year old male, never smoker with history of obstructive sleep apnea, obesity, diabetes mellitus type II, asthma and respiratory failure after covid 19 pneumonia 05/2020 who returns to pulmonary clinic follow up.   He has been off supplemental oxygen since last year. He has dyspnea taking 1 flight of stairs. He has lost 10lbs recently and has noted improvement in his dyspnea. He is planning on having total hip arthroplasty in the near future and needs pre-op evaluation.  OV 04/10/22 He has returned to his weight prior to the covid 19 infection and is now up to 255lbs. He is not using CPAP due to issues with mask fit and moving a lot at night. He is being followed for chronic back pain by surgery.   He completed pulmonary rehab 05/31/21 and did not require supplemental oxygen with exertion.   OV 12/11/20 He has been weaned down to 1L of supplemental oxygen throughout the day and night. He received his cpap machine recently and will be starting that tonight. He continues to experience exertional dyspnea especially with stairs otherwise he reports he continues to notice some improvement in his shortness of breath.  His PFTs 10/24/20 showed showed mild restrictive defect.   Past Medical History:  Diagnosis Date   Anxiety    Arthritis    Asthma    pulmonary allergies- no asthma per pt   Atrophic condition of skin    Cellulitis/abscess - trunk    COPD (chronic obstructive pulmonary disease) (HCC)    Cyst    shoulder   Depression    Diabetes mellitus without complication (HCC)    Hyperlipidemia    Hypertension     Hypertrophic condition of skin    Lipoma    Obesity    Pneumonia    hx child   Sleep apnea    no cpap   Stones in the urinary tract    Vitamin D deficiency      Family History  Problem Relation Age of Onset   Heart attack Father    Heart disease Father        heart attack   Allergies Mother    Asthma Mother      Social History   Socioeconomic History   Marital status: Married    Spouse name: Not on file   Number of children: 1   Years of education: Not on file   Highest education level: Not on file  Occupational History   Occupation: Advice worker: [employer]  Tobacco Use   Smoking status: Never   Smokeless tobacco: Never  Vaping Use   Vaping status: Never Used  Substance and Sexual Activity   Alcohol use: Not Currently    Comment: occ   Drug use: No   Sexual activity: Not Currently  Other Topics Concern   Not on file  Social History Narrative   Not on file   Social Determinants of Health   Financial Resource Strain: Not on file  Food Insecurity: Not on file  Transportation Needs: Not on file  Physical Activity: Not on file  Stress: Not on  file  Social Connections: Not on file  Intimate Partner Violence: Not on file     No Known Allergies   Outpatient Medications Prior to Visit  Medication Sig Dispense Refill   acetaminophen (TYLENOL) 500 MG tablet Take 2,000 mg by mouth See admin instructions. Take 2000 mg in the morning, may take a second 2000 mg dose during the day as needed for pain     albuterol (VENTOLIN HFA) 108 (90 Base) MCG/ACT inhaler Inhale 2 puffs into the lungs every 4 (four) hours as needed for wheezing or shortness of breath. 1 each 0   atorvastatin (LIPITOR) 40 MG tablet Take 1 tablet by mouth daily.     calcium carbonate (TUMS - DOSED IN MG ELEMENTAL CALCIUM) 500 MG chewable tablet Chew 2 tablets by mouth daily as needed for indigestion or heartburn.     clonazePAM (KLONOPIN) 0.5 MG tablet Take 0.5 mg by mouth 2 (two) times  daily.     diphenhydramine-acetaminophen (TYLENOL PM) 25-500 MG TABS tablet Take 2 tablets by mouth at bedtime.     ipratropium-albuterol (DUONEB) 0.5-2.5 (3) MG/3ML SOLN Take 3 mLs by nebulization every 6 (six) hours as needed (shortness of breath).     losartan (COZAAR) 50 MG tablet Take 50 mg by mouth daily.     metFORMIN (GLUCOPHAGE-XR) 500 MG 24 hr tablet Take 500 mg by mouth 2 (two) times daily.     Multiple Vitamin (MULTIVITAMIN WITH MINERALS) TABS tablet Take 1 tablet by mouth daily.     oxyCODONE-acetaminophen (PERCOCET) 10-325 MG tablet Take 1 tablet by mouth 3 (three) times daily as needed for pain.     sertraline (ZOLOFT) 100 MG tablet Take 200 mg by mouth daily.     sildenafil (REVATIO) 20 MG tablet Take 20-100 mg by mouth daily as needed (ED).     tamsulosin (FLOMAX) 0.4 MG CAPS capsule Take 0.4 mg by mouth daily.     No facility-administered medications prior to visit.    Review of Systems  Constitutional:  Negative for chills, fever, malaise/fatigue and weight loss.  HENT:  Negative for congestion, sinus pain and sore throat.   Eyes: Negative.   Respiratory:  Positive for shortness of breath. Negative for cough, hemoptysis, sputum production and wheezing.   Cardiovascular:  Negative for chest pain, palpitations, orthopnea, claudication, leg swelling and PND.  Gastrointestinal:  Negative for abdominal pain, heartburn, nausea and vomiting.  Genitourinary: Negative.   Musculoskeletal: Negative.   Neurological:  Negative for dizziness, weakness and headaches.  Psychiatric/Behavioral: Negative.     Objective:   Vitals:   02/25/23 1325  BP: 122/74  Pulse: 83  SpO2: 95%  Weight: 243 lb (110.2 kg)  Height: 5\' 9"  (1.753 m)    Physical Exam Constitutional:      General: He is not in acute distress.    Appearance: He is obese.  HENT:     Head: Normocephalic and atraumatic.     Nose: Nose normal.     Mouth/Throat:     Mouth: Mucous membranes are moist.     Pharynx:  Oropharynx is clear.  Cardiovascular:     Rate and Rhythm: Normal rate and regular rhythm.     Pulses: Normal pulses.     Heart sounds: Normal heart sounds. No murmur heard. Pulmonary:     Effort: Pulmonary effort is normal.     Breath sounds: No wheezing, rhonchi or rales.  Musculoskeletal:     Right lower leg: No edema.     Left  lower leg: No edema.  Skin:    General: Skin is warm and dry.     Capillary Refill: Capillary refill takes less than 2 seconds.  Neurological:     General: No focal deficit present.     Mental Status: He is alert.  Psychiatric:        Mood and Affect: Mood normal.        Behavior: Behavior normal.        Thought Content: Thought content normal.        Judgment: Judgment normal.    CBC    Component Value Date/Time   WBC 9.1 02/19/2023 0944   RBC 4.58 02/19/2023 0944   HGB 14.0 02/19/2023 0944   HCT 43.0 02/19/2023 0944   PLT 301 02/19/2023 0944   MCV 93.9 02/19/2023 0944   MCH 30.6 02/19/2023 0944   MCHC 32.6 02/19/2023 0944   RDW 12.8 02/19/2023 0944   LYMPHSABS 1.0 05/31/2020 0423   MONOABS 0.8 05/31/2020 0423   EOSABS 0.1 05/31/2020 0423   BASOSABS 0.0 05/31/2020 0423      Latest Ref Rng & Units 02/19/2023    9:44 AM 06/19/2020    6:04 AM 06/18/2020    4:54 AM  BMP  Glucose 70 - 99 mg/dL 161   85   BUN 8 - 23 mg/dL 20   9   Creatinine 0.96 - 1.24 mg/dL 0.45   4.09   Sodium 811 - 145 mmol/L 138   140   Potassium 3.5 - 5.1 mmol/L 4.1  4.1  3.4   Chloride 98 - 111 mmol/L 105   102   CO2 22 - 32 mmol/L 24   30   Calcium 8.9 - 10.3 mg/dL 9.1   8.7    Chest imaging: HRCT Chest 04/27/21 1. The appearance of the lungs is compatible with interstitial lung disease, with a spectrum of findings considered most compatible with an alternative diagnosis (not usual interstitial pneumonia) per current ATS guidelines. 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 2. Aortic atherosclerosis, in addition to left anterior descending coronary artery disease. Please note that although the presence of coronary artery calcium documents the presence of coronary artery disease, the severity of this disease and any potential stenosis cannot be assessed on this non-gated CT examination. Assessment for potential risk factor modification, dietary therapy or pharmacologic therapy may be warranted, if clinically indicated. 3. Hepatic steatosis.  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 No visible effusion or air leak. Stable heart size which is accentuated by technique.  PFT:    Latest Ref Rng & Units 10/24/2020   11:40 AM  PFT Results  FVC-Pre L 2.67   FVC-Predicted Pre % 61   FVC-Post L 2.66   FVC-Predicted Post % 61   Pre FEV1/FVC % % 85   Post FEV1/FCV % % 89   FEV1-Pre L 2.28   FEV1-Predicted Pre % 70   FEV1-Post L 2.36   DLCO uncorrected ml/min/mmHg 20.61   DLCO UNC% % 80   DLCO corrected ml/min/mmHg 20.61   DLCO COR %Predicted % 80   DLVA Predicted % 121   TLC L 4.20   TLC % Predicted % 63   RV % Predicted % 72   PFTs 2022: Mild restrictive defect  Echo: 05/02/20 1. Limited study with poor echo windows, Definity contrast given   2. Left ventricular ejection  fraction, by estimation, is 65 to 70%. The  left ventricle has normal function. The left ventricle has no regional  wall motion abnormalities. There is mild left ventricular hypertrophy.  Left ventricular diastolic parameters  are consistent with Grade I diastolic dysfunction (impaired relaxation).   3. The aortic valve is tricuspid. Aortic valve regurgitation is not  visualized. Mild aortic valve sclerosis is present, with no evidence of  aortic valve stenosis.   Lower extremity Doppler US 04/28/20 RIGHT:  - Findings consistent with acute deep vein thrombosis involving the right  posterior tibial  veins, and right peroneal veins.  - No cystic structure found in the popliteal fossa.     LEFT:  - Findings consistent with acute deep vein thrombosis involving the left  posterior tibial veins, and left soleal veins.  - No cystic structure found in the popliteal fossa.   Sleep Study 08/27/20   Overall AHI 7.2/hr REM AHI 28.8/hr  Supine AHI 50.6/hr Min SpO2 77%  Assessment & Plan:   ILD (interstitial lung disease) (HCC) - Plan: Pulmonary Function Test  OSA (obstructive sleep apnea)  Discussion: Clayton [name] is a 67 year old male, never smoker with history of obstructive sleep apnea, obesity, diabetes mellitus type II, asthma and acute hypoxemic respiratory failure after covid 19 pneumonia 05/2020 who returns to pulmonary clinic follow up.   He has post-covid fibrosis and has been weaned off supplemental oxygen support. He has completed pulmonary rehab 05/2021. He has been working on weight loss with a decrease in his dyspnea symptoms.   In regard to his upcoming hip surgery:  [place] Risk Index: Intermediate risk 13.3% risk of in-hospital post-op pulmonary complications (composite including respiratory failure, respiratory infection, pleural effusion, atelectasis, pneumothorax, bronchospasm, aspiration pneumonitis)  Recommend he use CPAP in the post-operative setting until anesthesia has completely worn off.   Patient to follow up in 6 months with repeat pulmonary function tests.  Melody Comas, MD [facility] Office: [phone]    Current Outpatient Medications:    acetaminophen (TYLENOL) 500 MG tablet, Take 2,000 mg by mouth See admin instructions. Take 2000 mg in the morning, may take a second 2000 mg dose during the day as needed for pain, Disp: , Rfl:    albuterol (VENTOLIN HFA) 108 (90 Base) MCG/ACT inhaler, Inhale 2 puffs into the lungs every 4 (four) hours as needed for wheezing or shortness of breath., Disp: 1 each, Rfl: 0   atorvastatin  (LIPITOR) 40 MG tablet, Take 1 tablet by mouth daily., Disp: , Rfl:    calcium carbonate (TUMS - DOSED IN MG ELEMENTAL CALCIUM) 500 MG chewable tablet, Chew 2 tablets by mouth daily as needed for indigestion or heartburn., Disp: , Rfl:    clonazePAM (KLONOPIN) 0.5 MG tablet, Take 0.5 mg by mouth 2 (two) times daily., Disp: , Rfl:    diphenhydramine-acetaminophen (TYLENOL PM) 25-500 MG TABS tablet, Take 2 tablets by mouth at bedtime., Disp: , Rfl:    ipratropium-albuterol (DUONEB) 0.5-2.5 (3) MG/3ML SOLN, Take 3 mLs by nebulization every 6 (six) hours as needed (shortness of breath)., Disp: , Rfl:    losartan (COZAAR) 50 MG tablet, Take 50 mg by mouth daily., Disp: , Rfl:    metFORMIN (GLUCOPHAGE-XR) 500 MG 24 hr tablet, Take 500 mg by mouth 2 (two) times daily., Disp: , Rfl:    Multiple Vitamin (MULTIVITAMIN WITH MINERALS) TABS tablet, Take 1 tablet by mouth daily., Disp: , Rfl:    oxyCODONE-acetaminophen (PERCOCET) 10-325 MG tablet, Take 1  tablet by mouth 3 (three) times daily as needed for pain., Disp: , Rfl:    sertraline (ZOLOFT) 100 MG tablet, Take 200 mg by mouth daily., Disp: , Rfl:    sildenafil (REVATIO) 20 MG tablet, Take 20-100 mg by mouth daily as needed (ED)., Disp: , Rfl:    tamsulosin (FLOMAX) 0.4 MG CAPS capsule, Take 0.4 mg by mouth daily., Disp: , Rfl:

## 2023-02-25 NOTE — Progress Notes (Signed)
Choose an anesthesia record to view details        DISCUSSION: Clayton [name] is a 67 year old male who presents to PAT prior to right THA with Dr. Constance Goltz on 03/04/2023.  Past medical history significant for obesity, OSA (no CPAP), restrictive lung disease followed by pulmonology, diabetes, hypertension, COPD, anxiety, hx of DVT  No prior anesthesia complications  Patient with history of acute DVT in 2021 in the bilateral lower extremities in the setting of COVID and hospitalization due to acute respiratory failure.  He has since followed up with hematology and they have recommended that he discontinue long-term anticoagulation since it is thought that this was a provoked episode.  Patient saw cardiology on 02/13/2023 in clinic due to history of dyspnea on exertion and mild coronary calcification seen on CT.  He was noted to be doing well from a cardiac standpoint.  No further testing was recommended and he was advised to follow-up in 1 year. : "Preoperative cardiovascular examination -He reports he will have knee surgery.  No symptoms of angina.  Does get short of breath with activity but this is related to his restrictive lung disease. -For cardiovascular standpoint his RCRI is 0 (no CHF, no diabetes on insulin, no CKD, no stroke, no CAD).  His EKG is normal.  He can actually, flight of stairs.  From my standpoint he does not need further cardiac testing.  Echoes in the past have been normal.  No signs of heart failure today.  No symptoms concerning for angina.  He may proceed to surgery from a cardiovascular standpoint at acceptable risk.  He may need to be evaluated by his pulmonologist for pulmonary clearance but from a cardiovascular standpoint he is stable."  Patient follows with pulmonology for restrictive lung disease.  Noted to be doing well from pulmonary standpoint.  He has been off supplemental oxygen for over a year and shortness of breath is improving with weight loss.  Noted to be  intermediate risk for surgery: "Intermediate risk 13.3% risk of in-hospital post-op pulmonary complications (composite including respiratory failure, respiratory infection, pleural effusion, atelectasis, pneumothorax, bronchospasm, aspiration pneumonitis)"   VS: BP 123/76   Pulse 64   Temp 37.1 C (Oral)   Resp 18   Ht 5\' 9"  (1.753 m)   Wt 111.1 kg   SpO2 97%   BMI 36.18 kg/m   PROVIDERS: Soundra Pilon, FNP Cardiology: Lennie Odor, MD Pulmonology: Melody Comas, MD  LABS: Labs reviewed: Acceptable for surgery. (all labs ordered are listed, but only abnormal results are displayed)  Labs Reviewed  HEMOGLOBIN A1C - Abnormal; Notable for the following components:      Result Value   Hgb A1c MFr Bld 7.6 (*)    All other components within normal limits  BASIC METABOLIC PANEL - Abnormal; Notable for the following components:   Glucose, Bld 142 (*)    All other components within normal limits  GLUCOSE, CAPILLARY - Abnormal; Notable for the following components:   Glucose-Capillary 137 (*)    All other components within normal limits  SURGICAL PCR SCREEN  CBC  TYPE AND SCREEN     IMAGES:   EKG 02/13/23  NSR, rate 75   CV:  Echo 08/16/2020: IMPRESSIONS     1. Left ventricular ejection fraction, by estimation, is 60 to 65%. The  left ventricle has normal function. The left ventricle has no regional  wall motion abnormalities. Left ventricular diastolic parameters are  consistent with Grade I diastolic  dysfunction (impaired  relaxation).   2. Right ventricular systolic function is normal. The right ventricular  size is mildly enlarged. There is normal pulmonary artery systolic  pressure. The estimated right ventricular systolic pressure is 24.2 mmHg.   3. The mitral valve is normal in structure. Trivial mitral valve  regurgitation.   4. The aortic valve is tricuspid. There is mild calcification of the  aortic valve. There is mild thickening of the aortic valve.  Aortic valve  regurgitation is not visualized.   Comparison(s): No significant change from prior study.   Past Medical History:  Diagnosis Date   Anxiety    Arthritis    Asthma    pulmonary allergies- no asthma per pt   Atrophic condition of skin    Cellulitis/abscess - trunk    COPD (chronic obstructive pulmonary disease) (HCC)    Cyst    shoulder   Depression    Diabetes mellitus without complication (HCC)    Hyperlipidemia    Hypertension    Hypertrophic condition of skin    Lipoma    Obesity    Pneumonia    hx child   Sleep apnea    no cpap   Stones in the urinary tract    Vitamin D deficiency     Past Surgical History:  Procedure Laterality Date   cyst removed from tailbone  1980   INSERTION OF MESH N/A 05/23/2015   Procedure: INSERTION OF MESH;  Surgeon: Axel Filler, MD;  Location: [facility] OR;  Service: General;  Laterality: N/A;   MASS EXCISION  07/15/11   left shoulder mass    NASAL SINUS SURGERY---UVULA REMOVED  2004   Dr. Annalee Genta   PROSTATE BIOPSY  9/16   TONSILLECTOMY     UMBILICAL HERNIA REPAIR N/A 05/23/2015   Procedure: LAPAROSCOPIC UMBILICAL HERNIA REPAIR ;  Surgeon: Axel Filler, MD;  Location: [facility] OR;  Service: General;  Laterality: N/A;    MEDICATIONS:  acetaminophen (TYLENOL) 500 MG tablet   albuterol (VENTOLIN HFA) 108 (90 Base) MCG/ACT inhaler   atorvastatin (LIPITOR) 40 MG tablet   calcium carbonate (TUMS - DOSED IN MG ELEMENTAL CALCIUM) 500 MG chewable tablet   clonazePAM (KLONOPIN) 0.5 MG tablet   diphenhydramine-acetaminophen (TYLENOL PM) 25-500 MG TABS tablet   ipratropium-albuterol (DUONEB) 0.5-2.5 (3) MG/3ML SOLN   losartan (COZAAR) 50 MG tablet   metFORMIN (GLUCOPHAGE-XR) 500 MG 24 hr tablet   Multiple Vitamin (MULTIVITAMIN WITH MINERALS) TABS tablet   oxyCODONE-acetaminophen (PERCOCET) 10-325 MG tablet   sertraline (ZOLOFT) 100 MG tablet   sildenafil (REVATIO) 20 MG tablet   tamsulosin (FLOMAX) 0.4 MG CAPS capsule   No  current facility-administered medications for this encounter.   Marcille Blanco [facility] Short Stay/Anesthesiology [facility] Phone [phone] 02/25/2023 3:30 PM

## 2023-02-25 NOTE — Anesthesia Preprocedure Evaluation (Addendum)
Anesthesia Evaluation  Patient identified by MRN, date of birth, ID band Patient awake    Reviewed: Allergy & Precautions, H&P , NPO status , Patient's Chart, lab work & pertinent test results  Airway Mallampati: III  TM Distance: <3 FB Neck ROM: Full    Dental no notable dental hx.    Pulmonary sleep apnea , COPD   Pulmonary exam normal breath sounds clear to auscultation       Cardiovascular hypertension, Normal cardiovascular exam Rhythm:Regular Rate:Normal     Neuro/Psych negative neurological ROS  negative psych ROS   GI/Hepatic Neg liver ROS,GERD  ,,  Endo/Other  diabetes, Type 2    Renal/GU negative Renal ROS  negative genitourinary   Musculoskeletal negative musculoskeletal ROS (+)    Abdominal   Peds negative pediatric ROS (+)  Hematology negative hematology ROS (+)   Anesthesia Other Findings   Reproductive/Obstetrics negative OB ROS                              Anesthesia Physical Anesthesia Plan  ASA: 3  Anesthesia Plan: Spinal   Post-op Pain Management: Minimal or no pain anticipated   Induction: Intravenous  PONV Risk Score and Plan: 1 and Ondansetron, Propofol infusion and Treatment may vary due to age or medical condition  Airway Management Planned: Simple Face Mask  Additional Equipment:   Intra-op Plan:   Post-operative Plan:   Informed Consent: I have reviewed the patients History and Physical, chart, labs and discussed the procedure including the risks, benefits and alternatives for the proposed anesthesia with the patient or authorized representative who has indicated his/her understanding and acceptance.     Dental advisory given  Plan Discussed with: CRNA and Surgeon  Anesthesia Plan Comments: (See PAT note from 7/17 by Sherlie Ban PA-C )         Anesthesia Quick Evaluation

## 2023-02-26 DIAGNOSIS — M25551 Pain in right hip: Secondary | ICD-10-CM | POA: Diagnosis not present

## 2023-03-03 ENCOUNTER — Encounter: Payer: Self-pay | Admitting: Pulmonary Disease

## 2023-03-03 NOTE — Telephone Encounter (Signed)
OV notes and clearance form have been faxed back to [facility]. Nothing further needed at this time. ?

## 2023-03-03 NOTE — H&P (Signed)
TOTAL HIP ADMISSION H&P  Patient is admitted for right total hip arthroplasty.  Therapy Plans: HEP Disposition: Home with wife Planned DVT Prophylaxis: Xarelto 10mg  daily DME needed: walker PCP: Harmon Dun Pulm: Dr. Francine Graven Cardio: Dr. Flora Lipps TXA: IV Allergies: NKDA Anesthesia Concerns: none BMI: 39.6 Last HgbA1c: 7.6%   Other: - oxycodone, robaxin, celebrex - hx of COPD after COVID, PE  Subjective:  Chief Complaint: right hip pain  HPI: Clayton [name], 67 y.o. male, has a history of pain and functional disability in the right hip(s) due to arthritis and patient has failed non-surgical conservative treatments for greater than 12 weeks to include NSAID's and/or analgesics, corticosteriod injections, and activity modification.  Onset of symptoms was gradual starting 2 years ago with gradually worsening course since that time.The patient noted no past surgery on the right hip(s).  Patient currently rates pain in the right hip at 7 out of 10 with activity. Patient has worsening of pain with activity and weight bearing, pain that interfers with activities of daily living, and pain with passive range of motion. Patient has evidence of joint space narrowing by imaging studies. There is no current active infection.  Patient Active Problem List   Diagnosis Date Noted   OSA (obstructive sleep apnea) 01/15/2021   Chronic respiratory failure with hypoxia (HCC) 01/15/2021   Shortness of breath 01/15/2021   Physical deconditioning 01/15/2021   Malnutrition of moderate degree 05/18/2020   Uncontrolled type 2 diabetes mellitus with hyperglycemia (HCC) 04/25/2020   Acute hypoxemic respiratory failure due to COVID-19 [facility]) 04/24/2020   Hypokalemia 04/24/2020   Hyperglycemia 04/24/2020   Acute respiratory failure with hypoxia (HCC)    Suspected COVID-19 virus infection    S/P hernia repair 05/23/2015   Chronic cough 06/25/2011   LIPOMA, left shoulder. 04/04/2008   ALLERGIC RHINITIS 12/21/2007    GERD 12/21/2007   NEPHROLITHIASIS, HX OF 12/21/2007   Past Medical History:  Diagnosis Date   Anxiety    Arthritis    Asthma    pulmonary allergies- no asthma per pt   Atrophic condition of skin    Cellulitis/abscess - trunk    COPD (chronic obstructive pulmonary disease) (HCC)    Cyst    shoulder   Depression    Diabetes mellitus without complication (HCC)    Hyperlipidemia    Hypertension    Hypertrophic condition of skin    Lipoma    Obesity    Pneumonia    hx child   Sleep apnea    no cpap   Stones in the urinary tract    Vitamin D deficiency     Past Surgical History:  Procedure Laterality Date   cyst removed from tailbone  1980   INSERTION OF MESH N/A 05/23/2015   Procedure: INSERTION OF MESH;  Surgeon: Axel Filler, MD;  Location: [facility] OR;  Service: General;  Laterality: N/A;   MASS EXCISION  07/15/11   left shoulder mass    NASAL SINUS SURGERY---UVULA REMOVED  2004   Dr. Annalee Genta   PROSTATE BIOPSY  9/16   TONSILLECTOMY     UMBILICAL HERNIA REPAIR N/A 05/23/2015   Procedure: LAPAROSCOPIC UMBILICAL HERNIA REPAIR ;  Surgeon: Axel Filler, MD;  Location: [facility] OR;  Service: General;  Laterality: N/A;    No current facility-administered medications for this encounter.   Current Outpatient Medications  Medication Sig Dispense Refill Last Dose   albuterol (VENTOLIN HFA) 108 (90 Base) MCG/ACT inhaler Inhale 2 puffs into the lungs every 4 (four) hours as  needed for wheezing or shortness of breath. 1 each 0    atorvastatin (LIPITOR) 40 MG tablet Take 1 tablet by mouth daily.      calcium carbonate (TUMS - DOSED IN MG ELEMENTAL CALCIUM) 500 MG chewable tablet Chew 2 tablets by mouth daily as needed for indigestion or heartburn.      clonazePAM (KLONOPIN) 0.5 MG tablet Take 0.5 mg by mouth 2 (two) times daily.      diphenhydramine-acetaminophen (TYLENOL PM) 25-500 MG TABS tablet Take 2 tablets by mouth at bedtime.      ipratropium-albuterol (DUONEB) 0.5-2.5 (3)  MG/3ML SOLN Take 3 mLs by nebulization every 6 (six) hours as needed (shortness of breath).      losartan (COZAAR) 50 MG tablet Take 50 mg by mouth daily.      metFORMIN (GLUCOPHAGE-XR) 500 MG 24 hr tablet Take 500 mg by mouth 2 (two) times daily.      Multiple Vitamin (MULTIVITAMIN WITH MINERALS) TABS tablet Take 1 tablet by mouth daily.      oxyCODONE-acetaminophen (PERCOCET) 10-325 MG tablet Take 1 tablet by mouth 3 (three) times daily as needed for pain.      sertraline (ZOLOFT) 100 MG tablet Take 200 mg by mouth daily.      sildenafil (REVATIO) 20 MG tablet Take 20-100 mg by mouth daily as needed (ED).      tamsulosin (FLOMAX) 0.4 MG CAPS capsule Take 0.4 mg by mouth daily.      acetaminophen (TYLENOL) 500 MG tablet Take 2,000 mg by mouth See admin instructions. Take 2000 mg in the morning, may take a second 2000 mg dose during the day as needed for pain      No Known Allergies  Social History   Tobacco Use   Smoking status: Never   Smokeless tobacco: Never  Substance Use Topics   Alcohol use: Not Currently    Comment: occ    Family History  Problem Relation Age of Onset   Heart attack Father    Heart disease Father        heart attack   Allergies Mother    Asthma Mother      Review of Systems  Constitutional:  Negative for chills and fever.  Respiratory:  Negative for cough and shortness of breath.   Cardiovascular:  Negative for chest pain.  Gastrointestinal:  Negative for nausea and vomiting.  Musculoskeletal:  Positive for arthralgias.     Objective:  Physical Exam Well nourished and well developed. General: Alert and oriented x3, cooperative and pleasant, no acute distress. Head: normocephalic, atraumatic, neck supple. Eyes: EOMI.  Musculoskeletal: Right hip exam: He has noted to have a painful and limited hip flexion internal rotation to 5 to 10 degrees with pelvic tilting, external rotation over 20 degrees Mild tenderness around the right hip  girdle Slight external rotation contracture with active hip flexion Left hip exam reveals some tightness with range of motion assessment without as much pain as the right currently No significant lower extremity edema, erythema or calf tenderness  Calves soft and nontender. Motor function intact in LE. Strength 5/5 LE bilaterally. Neuro: Distal pulses 2+. Sensation to light touch intact in LE.  Vital signs in last 24 hours:    Labs:   Estimated body mass index is 35.88 kg/m as calculated from the following:   Height as of 02/25/23: 5\' 9"  (1.753 m).   Weight as of 02/25/23: 110.2 kg.   Imaging Review Plain radiographs demonstrate severe degenerative joint disease of  the right hip(s). The bone quality appears to be adequate for age and reported activity level.      Assessment/Plan:  End stage arthritis, right hip(s)  The patient history, physical examination, clinical judgement of the provider and imaging studies are consistent with end stage degenerative joint disease of the right hip(s) and total hip arthroplasty is deemed medically necessary. The treatment options including medical management, injection therapy, arthroscopy and arthroplasty were discussed at length. The risks and benefits of total hip arthroplasty were presented and reviewed. The risks due to aseptic loosening, infection, stiffness, dislocation/subluxation,  thromboembolic complications and other imponderables were discussed.  The patient acknowledged the explanation, agreed to proceed with the plan and consent was signed. Patient is being admitted for inpatient treatment for surgery, pain control, PT, OT, prophylactic antibiotics, VTE prophylaxis, progressive ambulation and ADL's and discharge planning.The patient is planning to be discharged  home   Rosalene Billings, PA-C Orthopedic Surgery [facility] [place] [phone]

## 2023-03-04 ENCOUNTER — Observation Stay (HOSPITAL_COMMUNITY)
Admission: RE | Admit: 2023-03-04 | Discharge: 2023-03-05 | Disposition: A | Discharge status: Home / Self Care | Payer: PPO | Source: Ambulatory Visit | Attending: Orthopedic Surgery | Admitting: Orthopedic Surgery

## 2023-03-04 ENCOUNTER — Other Ambulatory Visit: Payer: Self-pay

## 2023-03-04 ENCOUNTER — Observation Stay (HOSPITAL_COMMUNITY): Payer: PPO

## 2023-03-04 ENCOUNTER — Ambulatory Visit (HOSPITAL_BASED_OUTPATIENT_CLINIC_OR_DEPARTMENT_OTHER): Payer: PPO

## 2023-03-04 ENCOUNTER — Encounter (HOSPITAL_COMMUNITY)
Admission: RE | Disposition: A | Discharge status: Home / Self Care | Payer: Self-pay | Source: Ambulatory Visit | Attending: Orthopedic Surgery

## 2023-03-04 ENCOUNTER — Ambulatory Visit (HOSPITAL_COMMUNITY): Payer: PPO

## 2023-03-04 ENCOUNTER — Encounter (HOSPITAL_COMMUNITY): Payer: Self-pay | Admitting: Orthopedic Surgery

## 2023-03-04 ENCOUNTER — Ambulatory Visit (HOSPITAL_COMMUNITY): Payer: PPO | Admitting: Medical

## 2023-03-04 DIAGNOSIS — Z7984 Long term (current) use of oral hypoglycemic drugs: Secondary | ICD-10-CM | POA: Insufficient documentation

## 2023-03-04 DIAGNOSIS — M1611 Unilateral primary osteoarthritis, right hip: Principal | ICD-10-CM | POA: Insufficient documentation

## 2023-03-04 DIAGNOSIS — E1165 Type 2 diabetes mellitus with hyperglycemia: Secondary | ICD-10-CM

## 2023-03-04 DIAGNOSIS — Z471 Aftercare following joint replacement surgery: Secondary | ICD-10-CM | POA: Diagnosis not present

## 2023-03-04 DIAGNOSIS — I10 Essential (primary) hypertension: Secondary | ICD-10-CM | POA: Diagnosis not present

## 2023-03-04 DIAGNOSIS — G4733 Obstructive sleep apnea (adult) (pediatric): Secondary | ICD-10-CM

## 2023-03-04 DIAGNOSIS — E119 Type 2 diabetes mellitus without complications: Secondary | ICD-10-CM | POA: Diagnosis not present

## 2023-03-04 DIAGNOSIS — Z79899 Other long term (current) drug therapy: Secondary | ICD-10-CM | POA: Diagnosis not present

## 2023-03-04 DIAGNOSIS — Z8616 Personal history of COVID-19: Secondary | ICD-10-CM | POA: Diagnosis not present

## 2023-03-04 DIAGNOSIS — J45909 Unspecified asthma, uncomplicated: Secondary | ICD-10-CM | POA: Insufficient documentation

## 2023-03-04 DIAGNOSIS — Z96641 Presence of right artificial hip joint: Secondary | ICD-10-CM

## 2023-03-04 DIAGNOSIS — J449 Chronic obstructive pulmonary disease, unspecified: Secondary | ICD-10-CM | POA: Insufficient documentation

## 2023-03-04 HISTORY — PX: TOTAL HIP ARTHROPLASTY: SHX124

## 2023-03-04 LAB — GLUCOSE, CAPILLARY
Glucose-Capillary: 144 mg/dL — ABNORMAL HIGH (ref 70–99)
Glucose-Capillary: 150 mg/dL — ABNORMAL HIGH (ref 70–99)
Glucose-Capillary: 206 mg/dL — ABNORMAL HIGH (ref 70–99)

## 2023-03-04 SURGERY — ARTHROPLASTY, HIP, TOTAL, ANTERIOR APPROACH
Anesthesia: Spinal | Site: Hip | Laterality: Right

## 2023-03-04 MED ORDER — DIPHENHYDRAMINE HCL 12.5 MG/5ML PO ELIX: 12.5000 mg | ORAL_SOLUTION | ORAL | Status: DC | PRN

## 2023-03-04 MED ORDER — MIDAZOLAM HCL 5 MG/5ML IJ SOLN
INTRAMUSCULAR | Status: DC | PRN
  Administered 2023-03-04: 2 mg via INTRAVENOUS

## 2023-03-04 MED ORDER — ONDANSETRON HCL 4 MG/2ML IJ SOLN
INTRAMUSCULAR | Status: AC
  Filled 2023-03-04: qty 2

## 2023-03-04 MED ORDER — OXYCODONE HCL 5 MG PO TABS
10.0000 mg | ORAL_TABLET | ORAL | Status: DC | PRN
  Administered 2023-03-04 – 2023-03-05 (×3): 15 mg via ORAL
  Filled 2023-03-04 (×3): qty 3

## 2023-03-04 MED ORDER — CELECOXIB 200 MG PO CAPS
200.0000 mg | ORAL_CAPSULE | Freq: Two times a day (BID) | ORAL | Status: DC
  Administered 2023-03-04 – 2023-03-05 (×2): 200 mg via ORAL
  Filled 2023-03-04 (×2): qty 1

## 2023-03-04 MED ORDER — CEFAZOLIN SODIUM-DEXTROSE 2-4 GM/100ML-% IV SOLN
2.0000 g | INTRAVENOUS | Status: AC
  Administered 2023-03-04: 2 g via INTRAVENOUS
  Filled 2023-03-04: qty 100

## 2023-03-04 MED ORDER — ALBUTEROL SULFATE HFA 108 (90 BASE) MCG/ACT IN AERS: 2.0000 | INHALATION_SPRAY | RESPIRATORY_TRACT | Status: DC | PRN

## 2023-03-04 MED ORDER — MIDAZOLAM HCL 2 MG/2ML IJ SOLN
INTRAMUSCULAR | Status: AC
  Filled 2023-03-04: qty 2

## 2023-03-04 MED ORDER — CHLORHEXIDINE GLUCONATE 0.12 % MT SOLN
15.0000 mL | Freq: Once | OROMUCOSAL | Status: AC
  Administered 2023-03-04: 15 mL via OROMUCOSAL

## 2023-03-04 MED ORDER — PROPOFOL 1000 MG/100ML IV EMUL
INTRAVENOUS | Status: AC
  Filled 2023-03-04: qty 100

## 2023-03-04 MED ORDER — HYDROMORPHONE HCL 1 MG/ML IJ SOLN: 0.2500 mg | INTRAMUSCULAR | Status: DC | PRN

## 2023-03-04 MED ORDER — POVIDONE-IODINE 10 % EX SWAB: 2.0000 | Freq: Once | CUTANEOUS | Status: DC

## 2023-03-04 MED ORDER — SERTRALINE HCL 100 MG PO TABS
200.0000 mg | ORAL_TABLET | Freq: Every day | ORAL | Status: DC
  Administered 2023-03-05: 200 mg via ORAL
  Filled 2023-03-04: qty 2

## 2023-03-04 MED ORDER — METHOCARBAMOL 500 MG IVPB - SIMPLE MED: 500.0000 mg | Freq: Four times a day (QID) | INTRAVENOUS | Status: DC | PRN

## 2023-03-04 MED ORDER — ATORVASTATIN CALCIUM 40 MG PO TABS
40.0000 mg | ORAL_TABLET | Freq: Every day | ORAL | Status: DC
  Administered 2023-03-05: 40 mg via ORAL
  Filled 2023-03-04: qty 1

## 2023-03-04 MED ORDER — HYDROMORPHONE HCL 1 MG/ML IJ SOLN
0.5000 mg | INTRAMUSCULAR | Status: DC | PRN
  Administered 2023-03-04: 1 mg via INTRAVENOUS
  Filled 2023-03-04: qty 1

## 2023-03-04 MED ORDER — ONDANSETRON HCL 4 MG/2ML IJ SOLN: 4.0000 mg | Freq: Once | INTRAMUSCULAR | Status: DC | PRN

## 2023-03-04 MED ORDER — DEXAMETHASONE SODIUM PHOSPHATE 10 MG/ML IJ SOLN
INTRAMUSCULAR | Status: AC
  Filled 2023-03-04: qty 1

## 2023-03-04 MED ORDER — PROPOFOL 10 MG/ML IV BOLUS
INTRAVENOUS | Status: AC
  Filled 2023-03-04: qty 20

## 2023-03-04 MED ORDER — BISACODYL 10 MG RE SUPP: 10.0000 mg | Freq: Every day | RECTAL | Status: DC | PRN

## 2023-03-04 MED ORDER — STERILE WATER FOR IRRIGATION IR SOLN
Status: DC | PRN
  Administered 2023-03-04: 2000 mL

## 2023-03-04 MED ORDER — OXYCODONE HCL 5 MG PO TABS: 5.0000 mg | ORAL_TABLET | Freq: Once | ORAL | Status: DC | PRN

## 2023-03-04 MED ORDER — DEXAMETHASONE SODIUM PHOSPHATE 10 MG/ML IJ SOLN
8.0000 mg | Freq: Once | INTRAMUSCULAR | Status: AC
  Administered 2023-03-04: 8 mg via INTRAVENOUS

## 2023-03-04 MED ORDER — PHENOL 1.4 % MT LIQD: 1.0000 | OROMUCOSAL | Status: DC | PRN

## 2023-03-04 MED ORDER — TAMSULOSIN HCL 0.4 MG PO CAPS
0.4000 mg | ORAL_CAPSULE | Freq: Every day | ORAL | Status: DC
  Administered 2023-03-04 – 2023-03-05 (×2): 0.4 mg via ORAL
  Filled 2023-03-04 (×2): qty 1

## 2023-03-04 MED ORDER — METOCLOPRAMIDE HCL 5 MG/ML IJ SOLN: 5.0000 mg | Freq: Three times a day (TID) | INTRAMUSCULAR | Status: DC | PRN

## 2023-03-04 MED ORDER — PHENYLEPHRINE HCL-NACL 20-0.9 MG/250ML-% IV SOLN
INTRAVENOUS | Status: DC | PRN
  Administered 2023-03-04: 50 ug/min via INTRAVENOUS

## 2023-03-04 MED ORDER — METHOCARBAMOL 500 MG PO TABS
500.0000 mg | ORAL_TABLET | Freq: Four times a day (QID) | ORAL | Status: DC | PRN
  Administered 2023-03-04 – 2023-03-05 (×2): 500 mg via ORAL
  Filled 2023-03-04 (×2): qty 1

## 2023-03-04 MED ORDER — PROPOFOL 500 MG/50ML IV EMUL
INTRAVENOUS | Status: DC | PRN
  Administered 2023-03-04: 75 ug/kg/min via INTRAVENOUS

## 2023-03-04 MED ORDER — PHENYLEPHRINE HCL (PRESSORS) 10 MG/ML IV SOLN
INTRAVENOUS | Status: AC
  Filled 2023-03-04: qty 1

## 2023-03-04 MED ORDER — OXYCODONE HCL 5 MG/5ML PO SOLN: 5.0000 mg | Freq: Once | ORAL | Status: DC | PRN

## 2023-03-04 MED ORDER — ACETAMINOPHEN 500 MG PO TABS
1000.0000 mg | ORAL_TABLET | Freq: Four times a day (QID) | ORAL | Status: DC
  Administered 2023-03-04 – 2023-03-05 (×3): 1000 mg via ORAL
  Filled 2023-03-04 (×3): qty 2

## 2023-03-04 MED ORDER — LOSARTAN POTASSIUM 50 MG PO TABS
50.0000 mg | ORAL_TABLET | Freq: Every day | ORAL | Status: DC
  Administered 2023-03-05: 50 mg via ORAL
  Filled 2023-03-04: qty 1

## 2023-03-04 MED ORDER — TRANEXAMIC ACID-NACL 1000-0.7 MG/100ML-% IV SOLN
1000.0000 mg | Freq: Once | INTRAVENOUS | Status: AC
  Administered 2023-03-04: 1000 mg via INTRAVENOUS
  Filled 2023-03-04: qty 100

## 2023-03-04 MED ORDER — CLONAZEPAM 0.5 MG PO TABS
0.5000 mg | ORAL_TABLET | Freq: Two times a day (BID) | ORAL | Status: DC
  Administered 2023-03-04 – 2023-03-05 (×2): 0.5 mg via ORAL
  Filled 2023-03-04 (×2): qty 1

## 2023-03-04 MED ORDER — BUPIVACAINE-EPINEPHRINE (PF) 0.5% -1:200000 IJ SOLN
INTRAMUSCULAR | Status: AC
  Filled 2023-03-04: qty 30

## 2023-03-04 MED ORDER — POLYETHYLENE GLYCOL 3350 17 G PO PACK
17.0000 g | PACK | Freq: Two times a day (BID) | ORAL | Status: DC
  Administered 2023-03-04 – 2023-03-05 (×2): 17 g via ORAL
  Filled 2023-03-04 (×2): qty 1

## 2023-03-04 MED ORDER — CALCIUM CARBONATE ANTACID 500 MG PO CHEW: 2.0000 | CHEWABLE_TABLET | Freq: Every day | ORAL | Status: DC | PRN

## 2023-03-04 MED ORDER — KETOROLAC TROMETHAMINE 30 MG/ML IJ SOLN
INTRAMUSCULAR | Status: AC
  Filled 2023-03-04: qty 1

## 2023-03-04 MED ORDER — IPRATROPIUM-ALBUTEROL 0.5-2.5 (3) MG/3ML IN SOLN: 3.0000 mL | Freq: Four times a day (QID) | RESPIRATORY_TRACT | Status: DC | PRN

## 2023-03-04 MED ORDER — ONDANSETRON HCL 4 MG/2ML IJ SOLN: 4.0000 mg | Freq: Four times a day (QID) | INTRAMUSCULAR | Status: DC | PRN

## 2023-03-04 MED ORDER — LACTATED RINGERS IV SOLN: INTRAVENOUS | Status: DC

## 2023-03-04 MED ORDER — SODIUM CHLORIDE (PF) 0.9 % IJ SOLN
INTRAMUSCULAR | Status: DC | PRN
  Administered 2023-03-04: 30 mL

## 2023-03-04 MED ORDER — ONDANSETRON HCL 4 MG/2ML IJ SOLN
INTRAMUSCULAR | Status: DC | PRN
  Administered 2023-03-04: 4 mg via INTRAVENOUS

## 2023-03-04 MED ORDER — BUPIVACAINE IN DEXTROSE 0.75-8.25 % IT SOLN
INTRATHECAL | Status: DC | PRN
  Administered 2023-03-04: 1.6 mL via INTRATHECAL

## 2023-03-04 MED ORDER — 0.9 % SODIUM CHLORIDE (POUR BTL) OPTIME
TOPICAL | Status: DC | PRN
  Administered 2023-03-04: 1000 mL

## 2023-03-04 MED ORDER — CEFAZOLIN SODIUM-DEXTROSE 2-4 GM/100ML-% IV SOLN
2.0000 g | Freq: Four times a day (QID) | INTRAVENOUS | Status: AC
  Administered 2023-03-04 – 2023-03-05 (×2): 2 g via INTRAVENOUS
  Filled 2023-03-04 (×2): qty 100

## 2023-03-04 MED ORDER — INSULIN ASPART 100 UNIT/ML IJ SOLN: 0.0000 [IU] | INTRAMUSCULAR | Status: DC | PRN

## 2023-03-04 MED ORDER — METOCLOPRAMIDE HCL 5 MG PO TABS: 5.0000 mg | ORAL_TABLET | Freq: Three times a day (TID) | ORAL | Status: DC | PRN

## 2023-03-04 MED ORDER — SODIUM CHLORIDE (PF) 0.9 % IJ SOLN
INTRAMUSCULAR | Status: AC
  Filled 2023-03-04: qty 50

## 2023-03-04 MED ORDER — MENTHOL 3 MG MT LOZG: 1.0000 | LOZENGE | OROMUCOSAL | Status: DC | PRN

## 2023-03-04 MED ORDER — RIVAROXABAN 10 MG PO TABS
10.0000 mg | ORAL_TABLET | Freq: Every day | ORAL | Status: DC
  Administered 2023-03-05: 10 mg via ORAL
  Filled 2023-03-04: qty 1

## 2023-03-04 MED ORDER — SODIUM CHLORIDE 0.9 % IV SOLN: INTRAVENOUS | Status: DC

## 2023-03-04 MED ORDER — DOCUSATE SODIUM 100 MG PO CAPS
100.0000 mg | ORAL_CAPSULE | Freq: Two times a day (BID) | ORAL | Status: DC
  Administered 2023-03-04 – 2023-03-05 (×2): 100 mg via ORAL
  Filled 2023-03-04 (×2): qty 1

## 2023-03-04 MED ORDER — OXYCODONE HCL 5 MG PO TABS: 5.0000 mg | ORAL_TABLET | ORAL | Status: DC | PRN

## 2023-03-04 MED ORDER — ORAL CARE MOUTH RINSE: 15.0000 mL | Freq: Once | OROMUCOSAL | Status: AC

## 2023-03-04 MED ORDER — TRANEXAMIC ACID-NACL 1000-0.7 MG/100ML-% IV SOLN
1000.0000 mg | INTRAVENOUS | Status: AC
  Administered 2023-03-04: 1000 mg via INTRAVENOUS
  Filled 2023-03-04: qty 100

## 2023-03-04 MED ORDER — PROPOFOL 10 MG/ML IV BOLUS
INTRAVENOUS | Status: DC | PRN
  Administered 2023-03-04: 30 mg via INTRAVENOUS
  Administered 2023-03-04: 50 mg via INTRAVENOUS
  Administered 2023-03-04: 20 mg via INTRAVENOUS
  Administered 2023-03-04: 40 mg via INTRAVENOUS

## 2023-03-04 MED ORDER — ONDANSETRON HCL 4 MG PO TABS: 4.0000 mg | ORAL_TABLET | Freq: Four times a day (QID) | ORAL | Status: DC | PRN

## 2023-03-04 MED ORDER — INSULIN ASPART 100 UNIT/ML IJ SOLN
0.0000 [IU] | Freq: Three times a day (TID) | INTRAMUSCULAR | Status: DC
  Administered 2023-03-05: 3 [IU] via SUBCUTANEOUS
  Administered 2023-03-05: 5 [IU] via SUBCUTANEOUS

## 2023-03-04 MED ORDER — DEXAMETHASONE SODIUM PHOSPHATE 10 MG/ML IJ SOLN
10.0000 mg | Freq: Once | INTRAMUSCULAR | Status: AC
  Administered 2023-03-05: 10 mg via INTRAVENOUS
  Filled 2023-03-04: qty 1

## 2023-03-04 MED ORDER — METFORMIN HCL ER 500 MG PO TB24
500.0000 mg | ORAL_TABLET | Freq: Two times a day (BID) | ORAL | Status: DC
  Administered 2023-03-05: 500 mg via ORAL
  Filled 2023-03-04: qty 1

## 2023-03-04 MED ORDER — BUPIVACAINE-EPINEPHRINE (PF) 0.25% -1:200000 IJ SOLN
INTRAMUSCULAR | Status: DC | PRN
  Administered 2023-03-04: 30 mL

## 2023-03-04 MED ORDER — KETOROLAC TROMETHAMINE 30 MG/ML IJ SOLN
INTRAMUSCULAR | Status: DC | PRN
  Administered 2023-03-04: 30 mg

## 2023-03-04 SURGICAL SUPPLY — 36 items
ADH SKN CLS APL DERMABOND .7 (GAUZE/BANDAGES/DRESSINGS) ×1
BLADE SAG 18X100X1.27 (BLADE) ×1 IMPLANT
COVER PERINEAL POST (MISCELLANEOUS) ×1 IMPLANT
COVER SURGICAL LIGHT HANDLE (MISCELLANEOUS) ×1 IMPLANT
CUP ACET PINNACLE SECTR 56MM (Hips) IMPLANT
DERMABOND ADVANCED .7 DNX12 (GAUZE/BANDAGES/DRESSINGS) ×1 IMPLANT
DRAPE FOOT SWITCH (DRAPES) ×1 IMPLANT
DRAPE STERI IOBAN 125X83 (DRAPES) ×1 IMPLANT
DRAPE U-SHAPE 47X51 STRL (DRAPES) ×2 IMPLANT
DRSG AQUACEL AG ADV 3.5X10 (GAUZE/BANDAGES/DRESSINGS) IMPLANT
DURAPREP 26ML APPLICATOR (WOUND CARE) ×1 IMPLANT
ELECT REM PT RETURN 15FT ADLT (MISCELLANEOUS) ×1 IMPLANT
GLOVE BIO SURGEON STRL SZ 6 (GLOVE) ×1 IMPLANT
GLOVE BIOGEL PI IND STRL 6.5 (GLOVE) ×1 IMPLANT
GLOVE BIOGEL PI IND STRL 7.5 (GLOVE) ×1 IMPLANT
GLOVE ORTHO TXT STRL SZ7.5 (GLOVE) ×2 IMPLANT
GOWN STRL REUS W/ TWL LRG LVL3 (GOWN DISPOSABLE) ×2 IMPLANT
GOWN STRL REUS W/TWL LRG LVL3 (GOWN DISPOSABLE) ×2
HEAD CERAMIC 36 PLUS5 (Hips) IMPLANT
KIT TURNOVER KIT A (KITS) IMPLANT
NDL SAFETY ECLIP 18X1.5 (MISCELLANEOUS) IMPLANT
PACK ANTERIOR HIP CUSTOM (KITS) ×1 IMPLANT
PINNACLE ALTRX PLUS 4 N 36X56 (Hips) IMPLANT
PINNACLE SECTOR CUP 56MM (Hips) ×1 IMPLANT
SCREW 6.5MMX30MM (Screw) IMPLANT
STEM FEMORAL SZ5 HIGH ACTIS (Stem) IMPLANT
SUT MNCRL AB 4-0 PS2 18 (SUTURE) ×1 IMPLANT
SUT STRATAFIX 0 PDS 27 VIOLET (SUTURE) ×1
SUT VIC AB 1 CT1 36 (SUTURE) ×3 IMPLANT
SUT VIC AB 2-0 CT1 27 (SUTURE) ×2
SUT VIC AB 2-0 CT1 TAPERPNT 27 (SUTURE) ×2 IMPLANT
SUTURE STRATFX 0 PDS 27 VIOLET (SUTURE) ×1 IMPLANT
SYR 3ML LL SCALE MARK (SYRINGE) IMPLANT
TRAY FOLEY MTR SLVR 16FR STAT (SET/KITS/TRAYS/PACK) IMPLANT
TUBE SUCTION HIGH CAP CLEAR NV (SUCTIONS) ×1 IMPLANT
WATER STERILE IRR 1000ML POUR (IV SOLUTION) ×1 IMPLANT

## 2023-03-04 NOTE — Anesthesia Procedure Notes (Signed)
Spinal  Patient location during procedure: OR Start time: 03/04/2023 4:25 PM End time: 03/04/2023 4:29 PM Reason for block: surgical anesthesia Staffing Performed: anesthesiologist  Anesthesiologist: Eilene Ghazi, MD Performed by: Eilene Ghazi, MD Authorized by: Eilene Ghazi, MD   Preanesthetic Checklist Completed: patient identified, IV checked, site marked, risks and benefits discussed, surgical consent, monitors and equipment checked, pre-op evaluation and timeout performed Spinal Block Patient position: sitting Prep: Betadine Patient monitoring: heart rate, continuous pulse ox and blood pressure Approach: midline Location: L3-4 Injection technique: single-shot Needle Needle type: Sprotte  Needle gauge: 24 G Needle length: 9 cm Assessment Sensory level: T6 Events: CSF return Additional Notes

## 2023-03-04 NOTE — Op Note (Signed)
NAME:  Clayton [name]                ACCOUNT NO.: [id number]      MEDICAL RECORD NO.: [id number]      FACILITY:  [facility]      PHYSICIAN:  Shelda Pal  DATE OF BIRTH:  1956-01-20     DATE OF PROCEDURE:  03/04/2023                                 OPERATIVE REPORT         PREOPERATIVE DIAGNOSIS: Right  hip osteoarthritis.      POSTOPERATIVE DIAGNOSIS:  Right hip osteoarthritis.      PROCEDURE:  Right total hip replacement through an anterior approach   utilizing DePuy THR system, component size 54 mm pinnacle cup, a size 36+4 neutral   Altrex liner, a size 5 Hi Actis stem with a 36+5 delta ceramic   ball.      SURGEON:  Madlyn Frankel. Charlann Boxer, M.D.      ASSISTANT:  Rosalene Billings, PA-C     ANESTHESIA:  Spinal.      SPECIMENS:  None.      COMPLICATIONS:  None.      BLOOD LOSS:  300 cc     DRAINS:  None.      INDICATION OF THE PROCEDURE:  [name] is a 67 y.o. male who had   presented to office for evaluation of right hip pain.  Radiographs revealed   progressive degenerative changes with bone-on-bone   articulation of the  hip joint, including subchondral cystic changes and osteophytes.  The patient had painful limited range of   motion significantly affecting their overall quality of life and function.  The patient was failing to    respond to conservative measures including medications and/or injections and activity modification and at this point was ready   to proceed with more definitive measures.  Consent was obtained for   benefit of pain relief.  Specific risks of infection, DVT, component   failure, dislocation, neurovascular injury, and need for revision surgery were reviewed in the office     PROCEDURE IN DETAIL:  The patient was brought to operative theater.   Once adequate anesthesia, preoperative antibiotics, 2 gm of Ancef, 1 gm of Tranexamic Acid, and 10 mg of Decadron were administered, the patient was positioned supine on the [employer]  table.  Once the patient was safely positioned with adequate padding of boney prominences we predraped out the hip, and used fluoroscopy to confirm orientation of the pelvis.      The right hip was then prepped and draped from proximal iliac crest to   mid thigh with a shower curtain technique.      Time-out was performed identifying the patient, planned procedure, and the appropriate extremity.     An incision was then made 2 cm lateral to the   anterior superior iliac spine extending over the orientation of the   tensor fascia lata muscle and sharp dissection was carried down to the   fascia of the muscle.      The fascia was then incised.  The muscle belly was identified and swept   laterally and retractor placed along the superior neck.  Following   cauterization of the circumflex vessels and removing some pericapsular   fat, a second cobra retractor was placed on the inferior neck.  A T-capsulotomy was made along the line of the   superior neck to the trochanteric fossa, then extended proximally and   distally.  Tag sutures were placed and the retractors were then placed   intracapsular.  We then identified the trochanteric fossa and   orientation of my neck cut and then made a neck osteotomy with the femur on traction.  The femoral   head was removed without difficulty or complication.  Traction was let   off and retractors were placed posterior and anterior around the   acetabulum.      The labrum and foveal tissue were debrided.  I began reaming with a 48 mm   reamer and reamed up to 53 mm reamer with good bony bed preparation and a 54 mm  cup was chosen.  The final 54 mm Pinnacle cup was then impacted under fluoroscopy to confirm the depth of penetration and orientation with respect to   Abduction and forward flexion.  A screw was placed into the ilium followed by the hole eliminator.  The final   36+4 neutral Altrex liner was impacted with good visualized rim fit.  The cup was  positioned anatomically within the acetabular portion of the pelvis.      At this point, the femur was rolled to 100 degrees.  Further capsule was   released off the inferior aspect of the femoral neck.  I then   released the superior capsule proximally.  With the leg in a neutral position the hook was placed laterally   along the femur under the vastus lateralis origin and elevated manually and then held in position using the hook attachment on the bed.  The leg was then extended and adducted with the leg rolled to 100   degrees of external rotation.  Retractors were placed along the medial calcar and posteriorly over the greater trochanter.  Once the proximal femur was fully   exposed, I used a box osteotome to set orientation.  I then began   broaching with the starting chili pepper broach and passed this by hand and then broached up to 5.  With the 5 broach in place I chose a high offset neck and did several trial reductions.  The offset was appropriate, leg lengths   appeared to be equal best matched with the +5 head ball trial confirmed radiographically.   Given these findings, I went ahead and dislocated the hip, repositioned all   retractors and positioned the right hip in the extended and abducted position.  The final 5 Hi Actis stem was   chosen and it was impacted down to the level of neck cut.  Based on this   and the trial reductions, a final 36+5 delta ceramic ball was chosen and   impacted onto a clean and dry trunnion, and the hip was reduced.  The   hip had been irrigated throughout the case again at this point.  I did   reapproximate the superior capsular leaflet to the anterior leaflet   using #1 Vicryl.  The fascia of the   tensor fascia lata muscle was then reapproximated using #1 Vicryl and #0 Stratafix sutures.  The   remaining wound was closed with 2-0 Vicryl and running 4-0 Monocryl.   The hip was cleaned, dried, and dressed sterilely using Dermabond and   Aquacel  dressing.  The patient was then brought   to recovery room in stable condition tolerating the procedure well.    Rosalene Billings, PA-C  was present for the entirety of the case involved from   preoperative positioning, perioperative retractor management, general   facilitation of the case, as well as primary wound closure as assistant.            Madlyn Frankel Charlann Boxer, M.D.        03/04/2023 3:33 PM

## 2023-03-04 NOTE — Care Plan (Signed)
Ortho Bundle Case Management Note  Patient Details  Name: Clayton [name] MRN: [id number] Date of Birth: [birth year]                  R THA on 03/04/23.  DCP: Home with wife.  DME: RW ordered through [employer]. PT: HEP   DME Arranged:  Walker rolling DME Agency:  [employer]    Additional Comments: Please contact me with any questions of if this plan should need to change.    Despina Pole, Case Manager  [facility]  [phone] 03/04/2023, 3:48 PM

## 2023-03-04 NOTE — Anesthesia Procedure Notes (Addendum)
Procedure Name: MAC Date/Time: 03/04/2023 4:17 PM  Performed by: Maurene Capes, CRNAPre-anesthesia Checklist: Patient identified, Emergency Drugs available, Suction available and Patient being monitored Patient Re-evaluated:Patient Re-evaluated prior to induction Oxygen Delivery Method: Simple face mask Induction Type: IV induction Placement Confirmation: positive ETCO2 Dental Injury: Teeth and Oropharynx as per pre-operative assessment

## 2023-03-04 NOTE — Anesthesia Postprocedure Evaluation (Signed)
Anesthesia Post Note  Patient: Clayton [name]  Procedure(s) Performed: TOTAL HIP ARTHROPLASTY ANTERIOR APPROACH (Right: Hip)     Patient location during evaluation: PACU Anesthesia Type: Spinal Level of consciousness: oriented and awake and alert Pain management: pain level controlled Vital Signs Assessment: post-procedure vital signs reviewed and stable Respiratory status: spontaneous breathing, respiratory function stable and patient connected to nasal cannula oxygen Cardiovascular status: blood pressure returned to baseline and stable Postop Assessment: no headache, no backache and no apparent nausea or vomiting Anesthetic complications: no  No notable events documented.  Last Vitals:  Vitals:   03/04/23 1845 03/04/23 1915  BP: 118/67 (!) 143/75  Pulse: (!) 53 75  Resp: 16 17  Temp:    SpO2: 93% 91%    Last Pain:  Vitals:   03/04/23 1845  TempSrc:   PainSc: 0-No pain                 Nathan Moctezuma S

## 2023-03-04 NOTE — Interval H&P Note (Signed)
History and Physical Interval Note:  03/04/2023 2:28 PM  Clayton [name]  has presented today for surgery, with the diagnosis of Right hip osteoarthritis.  The various methods of treatment have been discussed with the patient and family. After consideration of risks, benefits and other options for treatment, the patient has consented to  Procedure(s): TOTAL HIP ARTHROPLASTY ANTERIOR APPROACH (Right) as a surgical intervention.  The patient's history has been reviewed, patient examined, no change in status, stable for surgery.  I have reviewed the patient's chart and labs.  Questions were answered to the patient's satisfaction.     Shelda Pal

## 2023-03-04 NOTE — Transfer of Care (Signed)
Immediate Anesthesia Transfer of Care Note  Patient: Erhard Caramanica  Procedure(s) Performed: TOTAL HIP ARTHROPLASTY ANTERIOR APPROACH (Right: Hip)  Patient Location: PACU  Anesthesia Type:Spinal  Level of Consciousness: awake, alert , and oriented  Airway & Oxygen Therapy: Patient Spontanous Breathing and Patient connected to face mask oxygen  Post-op Assessment: Report given to RN and Post -op Vital signs reviewed and stable  Post vital signs: Reviewed and stable  Last Vitals:  Vitals Value Taken Time  BP 100/59 03/04/23 1752  Temp    Pulse 69 03/04/23 1755  Resp 15 03/04/23 1755  SpO2 99 % 03/04/23 1755  Vitals shown include unfiled device data.  Last Pain:  Vitals:   03/04/23 1453  TempSrc: Oral  PainSc: 5          Complications: No notable events documented.

## 2023-03-04 NOTE — Discharge Instructions (Addendum)
INSTRUCTIONS AFTER JOINT REPLACEMENT   Remove items at home which could result in a fall. This includes throw rugs or furniture in walking pathways ICE to the affected joint every three hours while awake for 30 minutes at a time, for at least the first 3-5 days, and then as needed for pain and swelling.  Continue to use ice for pain and swelling. You may notice swelling that will progress down to the foot and ankle.  This is normal after surgery.  Elevate your leg when you are not up walking on it.   Continue to use the breathing machine you got in the hospital (incentive spirometer) which will help keep your temperature down.  It is common for your temperature to cycle up and down following surgery, especially at night when you are not up moving around and exerting yourself.  The breathing machine keeps your lungs expanded and your temperature down.   DIET:  As you were doing prior to hospitalization, we recommend a well-balanced diet.  DRESSING / WOUND CARE / SHOWERING  Keep the surgical dressing until follow up.  The dressing is water proof, so you can shower without any extra covering.  IF THE DRESSING FALLS OFF or the wound gets wet inside, change the dressing with sterile gauze.  Please use good hand washing techniques before changing the dressing.  Do not use any lotions or creams on the incision until instructed by your surgeon.    ACTIVITY  Increase activity slowly as tolerated, but follow the weight bearing instructions below.   No driving for 6 weeks or until further direction given by your physician.  You cannot drive while taking narcotics.  No lifting or carrying greater than 10 lbs. until further directed by your surgeon. Avoid periods of inactivity such as sitting longer than an hour when not asleep. This helps prevent blood clots.  You may return to work once you are authorized by your doctor.     WEIGHT BEARING   Weight bearing as tolerated with assist device (walker, cane,  etc) as directed, use it as long as suggested by your surgeon or therapist, typically at least 4-6 weeks.   EXERCISES  Results after joint replacement surgery are often greatly improved when you follow the exercise, range of motion and muscle strengthening exercises prescribed by your doctor. Safety measures are also important to protect the joint from further injury. Any time any of these exercises cause you to have increased pain or swelling, decrease what you are doing until you are comfortable again and then slowly increase them. If you have problems or questions, call your caregiver or physical therapist for advice.   Rehabilitation is important following a joint replacement. After just a few days of immobilization, the muscles of the leg can become weakened and shrink (atrophy).  These exercises are designed to build up the tone and strength of the thigh and leg muscles and to improve motion. Often times heat used for twenty to thirty minutes before working out will loosen up your tissues and help with improving the range of motion but do not use heat for the first two weeks following surgery (sometimes heat can increase post-operative swelling).   These exercises can be done on a training (exercise) mat, on the floor, on a table or on a bed. Use whatever works the best and is most comfortable for you.    Use music or television while you are exercising so that the exercises are a pleasant break in your  day. This will make your life better with the exercises acting as a break in your routine that you can look forward to.   Perform all exercises about fifteen times, three times per day or as directed.  You should exercise both the operative leg and the other leg as well.  Exercises include:   Quad Sets - Tighten up the muscle on the front of the thigh (Quad) and hold for 5-10 seconds.   Straight Leg Raises - With your knee straight (if you were given a brace, keep it on), lift the leg to 60  degrees, hold for 3 seconds, and slowly lower the leg.  Perform this exercise against resistance later as your leg gets stronger.  Leg Slides: Lying on your back, slowly slide your foot toward your buttocks, bending your knee up off the floor (only go as far as is comfortable). Then slowly slide your foot back down until your leg is flat on the floor again.  Angel Wings: Lying on your back spread your legs to the side as far apart as you can without causing discomfort.  Hamstring Strength:  Lying on your back, push your heel against the floor with your leg straight by tightening up the muscles of your buttocks.  Repeat, but this time bend your knee to a comfortable angle, and push your heel against the floor.  You may put a pillow under the heel to make it more comfortable if necessary.   A rehabilitation program following joint replacement surgery can speed recovery and prevent re-injury in the future due to weakened muscles. Contact your doctor or a physical therapist for more information on knee rehabilitation.    CONSTIPATION  Constipation is defined medically as fewer than three stools per week and severe constipation as less than one stool per week.  Even if you have a regular bowel pattern at home, your normal regimen is likely to be disrupted due to multiple reasons following surgery.  Combination of anesthesia, postoperative narcotics, change in appetite and fluid intake all can affect your bowels.   YOU MUST use at least one of the following options; they are listed in order of increasing strength to get the job done.  They are all available over the counter, and you may need to use some, POSSIBLY even all of these options:    Drink plenty of fluids (prune juice may be helpful) and high fiber foods Colace 100 mg by mouth twice a day  Senokot for constipation as directed and as needed Dulcolax (bisacodyl), take with full glass of water  Miralax (polyethylene glycol) once or twice a day as  needed.  If you have tried all these things and are unable to have a bowel movement in the first 3-4 days after surgery call either your surgeon or your primary doctor.    If you experience loose stools or diarrhea, hold the medications until you stool forms back up.  If your symptoms do not get better within 1 week or if they get worse, check with your doctor.  If you experience "the worst abdominal pain ever" or develop nausea or vomiting, please contact the office immediately for further recommendations for treatment.   ITCHING:  If you experience itching with your medications, try taking only a single pain pill, or even half a pain pill at a time.  You can also use Benadryl over the counter for itching or also to help with sleep.   TED HOSE STOCKINGS:  Use stockings on both  legs until for at least 2 weeks or as directed by physician office. They may be removed at night for sleeping.  MEDICATIONS:  See your medication summary on the "After Visit Summary" that nursing will review with you.  You may have some home medications which will be placed on hold until you complete the course of blood thinner medication.  It is important for you to complete the blood thinner medication as prescribed.  PRECAUTIONS:  If you experience chest pain or shortness of breath - call 911 immediately for transfer to the hospital emergency department.   If you develop a fever greater that 101 F, purulent drainage from wound, increased redness or drainage from wound, foul odor from the wound/dressing, or calf pain - CONTACT YOUR SURGEON.                                                   FOLLOW-UP APPOINTMENTS:  If you do not already have a post-op appointment, please call the office for an appointment to be seen by your surgeon.  Guidelines for how soon to be seen are listed in your "After Visit Summary", but are typically between 1-4 weeks after surgery.  OTHER INSTRUCTIONS:   Knee Replacement:  Do not place pillow  under knee, focus on keeping the knee straight while resting. CPM instructions: 0-90 degrees, 2 hours in the morning, 2 hours in the afternoon, and 2 hours in the evening. Place foam block, curve side up under heel at all times except when in CPM or when walking.  DO NOT modify, tear, cut, or change the foam block in any way.  POST-OPERATIVE OPIOID TAPER INSTRUCTIONS: It is important to wean off of your opioid medication as soon as possible. If you do not need pain medication after your surgery it is ok to stop day one. Opioids include: Codeine, Hydrocodone(Norco, Vicodin), Oxycodone(Percocet, oxycontin) and hydromorphone amongst others.  Long term and even short term use of opiods can cause: Increased pain response Dependence Constipation Depression Respiratory depression And more.  Withdrawal symptoms can include Flu like symptoms Nausea, vomiting And more Techniques to manage these symptoms Hydrate well Eat regular healthy meals Stay active Use relaxation techniques(deep breathing, meditating, yoga) Do Not substitute Alcohol to help with tapering If you have been on opioids for less than two weeks and do not have pain than it is ok to stop all together.  Plan to wean off of opioids This plan should start within one week post op of your joint replacement. Maintain the same interval or time between taking each dose and first decrease the dose.  Cut the total daily intake of opioids by one tablet each day Next start to increase the time between doses. The last dose that should be eliminated is the evening dose.   MAKE SURE YOU:  Understand these instructions.  Get help right away if you are not doing well or get worse.    Thank you for letting us be a part of your medical care team.  It is a privilege we respect greatly.  We hope these instructions will help you stay on track for a fast and full recovery!    Information on my medicine - XARELTO (Rivaroxaban)  This medication  education was reviewed with me or my healthcare representative as part of my discharge preparation.  The pharmacist that  spoke with me during my hospital stay was:    Why was Xarelto prescribed for you? Xarelto was prescribed for you to reduce the risk of blood clots forming after orthopedic surgery. The medical term for these abnormal blood clots is venous thromboembolism (VTE).  What do you need to know about xarelto ? Take your Xarelto ONCE DAILY at the same time every day. You may take it either with or without food.  If you have difficulty swallowing the tablet whole, you may crush it and mix in applesauce just prior to taking your dose.  Take Xarelto exactly as prescribed by your doctor and DO NOT stop taking Xarelto without talking to the doctor who prescribed the medication.  Stopping without other VTE prevention medication to take the place of Xarelto may increase your risk of developing a clot.  After discharge, you should have regular check-up appointments with your healthcare provider that is prescribing your Xarelto.    What do you do if you miss a dose? If you miss a dose, take it as soon as you remember on the same day then continue your regularly scheduled once daily regimen the next day. Do not take two doses of Xarelto on the same day.   Important Safety Information A possible side effect of Xarelto is bleeding. You should call your healthcare provider right away if you experience any of the following: Bleeding from an injury or your nose that does not stop. Unusual colored urine (red or dark brown) or unusual colored stools (red or black). Unusual bruising for unknown reasons. A serious fall or if you hit your head (even if there is no bleeding).  Some medicines may interact with Xarelto and might increase your risk of bleeding while on Xarelto. To help avoid this, consult your healthcare provider or pharmacist prior to using any new prescription or  non-prescription medications, including herbals, vitamins, non-steroidal anti-inflammatory drugs (NSAIDs) and supplements.  This website has more information on Xarelto: [email or web address].

## 2023-03-05 ENCOUNTER — Encounter (HOSPITAL_COMMUNITY): Payer: Self-pay | Admitting: Orthopedic Surgery

## 2023-03-05 DIAGNOSIS — M1611 Unilateral primary osteoarthritis, right hip: Secondary | ICD-10-CM | POA: Diagnosis not present

## 2023-03-05 LAB — GLUCOSE, CAPILLARY
Glucose-Capillary: 153 mg/dL — ABNORMAL HIGH (ref 70–99)
Glucose-Capillary: 250 mg/dL — ABNORMAL HIGH (ref 70–99)

## 2023-03-05 MED ORDER — RIVAROXABAN 10 MG PO TABS: 10.0000 mg | ORAL_TABLET | Freq: Every day | ORAL | 0 refills | Status: DC

## 2023-03-05 MED ORDER — POLYETHYLENE GLYCOL 3350 17 G PO PACK: 17.0000 g | PACK | Freq: Two times a day (BID) | ORAL | 0 refills | Status: AC

## 2023-03-05 MED ORDER — CELECOXIB 200 MG PO CAPS: 200.0000 mg | ORAL_CAPSULE | Freq: Two times a day (BID) | ORAL | 0 refills | Status: DC

## 2023-03-05 MED ORDER — SENNA 8.6 MG PO TABS: 2.0000 | ORAL_TABLET | Freq: Every day | ORAL | 0 refills | Status: AC

## 2023-03-05 MED ORDER — OXYCODONE HCL 5 MG PO TABS: 5.0000 mg | ORAL_TABLET | ORAL | 0 refills | Status: DC | PRN

## 2023-03-05 MED ORDER — METHOCARBAMOL 500 MG PO TABS: 500.0000 mg | ORAL_TABLET | Freq: Four times a day (QID) | ORAL | 2 refills | Status: DC | PRN

## 2023-03-05 NOTE — Plan of Care (Signed)
  Problem: Activity: Goal: Ability to avoid complications of mobility impairment will improve Outcome: Progressing Goal: Ability to tolerate increased activity will improve Outcome: Progressing   Problem: Pain Management: Goal: Pain level will decrease with appropriate interventions Outcome: Progressing   

## 2023-03-05 NOTE — Progress Notes (Signed)
   Subjective: 1 Day Post-Op Procedure(s) (LRB): TOTAL HIP ARTHROPLASTY ANTERIOR APPROACH (Right) Patient reports pain as mild.   Patient seen in rounds by Dr. Charlann Boxer. Patient is resting in bed on exam this morning. No acute events overnight. Foley catheter removed. Patient has not been up with PT yet.  We will start therapy today.   Objective: Vital signs in last 24 hours: Temp:  [97.6 F (36.4 C)-100.9 F (38.3 C)] 99.4 F (37.4 C) (07/31 0654) Pulse Rate:  [53-81] 74 (07/31 0654) Resp:  [13-20] 16 (07/31 0654) BP: (100-151)/(59-91) 123/66 (07/31 0654) SpO2:  [93 %-99 %] 97 % (07/31 0654) Weight:  [110.2 kg] 110.2 kg (07/30 1453)  Intake/Output from previous day:  Intake/Output Summary (Last 24 hours) at 03/05/2023 0739 Last data filed at 03/05/2023 0600 Gross per 24 hour  Intake 2116.2 ml  Output 1305 ml  Net 811.2 ml     Intake/Output this shift: No intake/output data recorded.  Labs: Recent Labs    03/05/23 0404  HGB 11.9*   Recent Labs    03/05/23 0404  WBC 25.2*  RBC 3.78*  HCT 35.6*  PLT 285   Recent Labs    03/05/23 0404  NA 135  K 3.9  CL 102  CO2 24  BUN 19  CREATININE 0.95  GLUCOSE 171*  CALCIUM 8.5*   No results for input(s): "LABPT", "INR" in the last 72 hours.  Exam: General - Patient is Alert and Oriented Extremity - Neurologically intact Sensation intact distally Intact pulses distally Dorsiflexion/Plantar flexion intact Dressing - dressing C/D/I Motor Function - intact, moving foot and toes well on exam.   Past Medical History:  Diagnosis Date   Anxiety    Arthritis    Asthma    pulmonary allergies- no asthma per pt   Atrophic condition of skin    Cellulitis/abscess - trunk    COPD (chronic obstructive pulmonary disease) (HCC)    Cyst    shoulder   Depression    Diabetes mellitus without complication (HCC)    Hyperlipidemia    Hypertension    Hypertrophic condition of skin    Lipoma    Obesity    Pneumonia    hx  child   Sleep apnea    no cpap   Stones in the urinary tract    Vitamin D deficiency     Assessment/Plan: 1 Day Post-Op Procedure(s) (LRB): TOTAL HIP ARTHROPLASTY ANTERIOR APPROACH (Right) Principal Problem:   S/P total right hip arthroplasty  Estimated body mass index is 35.88 kg/m as calculated from the following:   Height as of this encounter: 5\' 9"  (1.753 m).   Weight as of this encounter: 110.2 kg. Advance diet Up with therapy D/C IV fluids  DVT Prophylaxis - Xarelto Weight bearing as tolerated.  Hgb stable at 11.9 this AM.  Plan is to go Home after hospital stay. Plan for discharge today after meeting goals with therapy. Follow up in the office in 2 weeks.   Rosalene Billings, PA-C Orthopedic Surgery [phone] 03/05/2023, 7:39 AM

## 2023-03-05 NOTE — Evaluation (Signed)
Physical Therapy Evaluation Patient Details Name: Clayton [name] MRN: [id number] DOB: 30-Dec-1955 Today's Date: 03/05/2023  History of Present Illness  66 yo male s/p RTHA-DA 03/04/23. Hx of COVID, DVT, asthma, obesity, DM  Clinical Impression  On eval, pt was Supv level for mobility. He walked ~120 feet x 2, negotiated stairs, and performed ROM exercises. Issued HEP for pt to follow at home. Encouraged pt to ambulate often. All PT education completed.         If plan is discharge home, recommend the following: Assist for transportation;Assistance with cooking/housework   Can travel by private Tax inspector (2 wheels)  Recommendations for Other Services       Functional Status Assessment Patient has had a recent decline in their functional status and demonstrates the ability to make significant improvements in function in a reasonable and predictable amount of time.     Precautions / Restrictions Precautions Precautions: Fall Restrictions Weight Bearing Restrictions: No RLE Weight Bearing: Weight bearing as tolerated      Mobility  Bed Mobility Overal bed mobility: Needs Assistance Bed Mobility: Supine to Sit     Supine to sit: Supervision     General bed mobility comments: Supv fof safety. Cues provided. Increased time. Pt used gait belt as leg lifter    Transfers Overall transfer level: Needs assistance Equipment used: Rolling walker (2 wheels) Transfers: Sit to/from Stand Sit to Stand: Supervision, From elevated surface           General transfer comment: Cues for safety, technique, hand placement. Increased time.    Ambulation/Gait Ambulation/Gait assistance: Supervision Gait Distance (Feet): 120 Feet (x2) Assistive device: Rolling walker (2 wheels) Gait Pattern/deviations: Step-through pattern, Decreased stride length       General Gait Details: Supv for safety. Steady gait with RW. No LOB. Pt denied  dizziness. Tolerated distance well.  Stairs Stairs: Yes Stairs assistance: Supervision Stair Management: Step to pattern, Forwards, One rail Left Number of Stairs: 3 General stair comments: Cues for safety, technique, sequence.  Wheelchair Mobility     Tilt Bed    Modified Rankin (Stroke Patients Only)       Balance Overall balance assessment: Needs assistance         Standing balance support: Bilateral upper extremity supported, Reliant on assistive device for balance, During functional activity Standing balance-Leahy Scale: Fair                               Pertinent Vitals/Pain Pain Assessment Pain Assessment: 0-10 Pain Score: 3  Pain Location: R hip/thigh Pain Descriptors / Indicators: Discomfort, Sore Pain Intervention(s): Monitored during session, Repositioned    Home Living Family/patient expects to be discharged to:: Private residence Living Arrangements: Spouse/significant other;Children;Parent Available Help at Discharge: Family Type of Home: House Home Access: Stairs to enter Entrance Stairs-Rails: Left Entrance Stairs-Number of Steps: 14 Alternate Level Stairs-Number of Steps: 3 steps then chair lift Home Layout: Two level;Bed/bath upstairs Home Equipment:  (hiking stick)      Prior Function Prior Level of Function : Independent/Modified Independent                     Hand Dominance        Extremity/Trunk Assessment   Upper Extremity Assessment Upper Extremity Assessment: Overall WFL for tasks assessed    Lower Extremity Assessment Lower Extremity Assessment: Generalized weakness  Cervical / Trunk Assessment Cervical / Trunk Assessment: Normal  Communication   Communication: No difficulties  Cognition Arousal/Alertness: Awake/alert Behavior During Therapy: WFL for tasks assessed/performed Overall Cognitive Status: Within Functional Limits for tasks assessed                                           General Comments      Exercises Total Joint Exercises Ankle Circles/Pumps: AROM, Both, 10 reps Quad Sets: AROM, Right, 10 reps Heel Slides: AAROM, Right, 10 reps Hip ABduction/ADduction: AAROM, Right, 10 reps, Standing, Supine Knee Flexion: AROM, Right, 10 reps, Standing Marching in Standing: AROM, Right, Left, 10 reps, Standing General Exercises - Lower Extremity Heel Raises: AROM, Both, 10 reps, Standing   Assessment/Plan    PT Assessment Patient needs continued PT services  PT Problem List Decreased strength;Decreased range of motion;Decreased activity tolerance;Decreased balance;Decreased mobility;Decreased knowledge of use of DME;Pain       PT Treatment Interventions DME instruction;Therapeutic exercise;Gait training;Balance training;Stair training;Functional mobility training;Therapeutic activities;Patient/family education    PT Goals (Current goals can be found in the Care Plan section)  Acute Rehab PT Goals Patient Stated Goal: regain PLOf/independence PT Goal Formulation: With patient Time For Goal Achievement: 03/19/23 Potential to Achieve Goals: Good    Frequency Min 1X/week     Co-evaluation               AM-PAC PT "6 Clicks" Mobility  Outcome Measure Help needed turning from your back to your side while in a flat bed without using bedrails?: None Help needed moving from lying on your back to sitting on the side of a flat bed without using bedrails?: None Help needed moving to and from a bed to a chair (including a wheelchair)?: None Help needed standing up from a chair using your arms (e.g., wheelchair or bedside chair)?: None Help needed to walk in hospital room?: A Little Help needed climbing 3-5 steps with a railing? : A Little 6 Click Score: 22    End of Session Equipment Utilized During Treatment: Gait belt Activity Tolerance: Patient tolerated treatment well Patient left: in chair;with call bell/phone within reach   PT Visit  Diagnosis: Other abnormalities of gait and mobility (R26.89)    Time: 2956-2130 PT Time Calculation (min) (ACUTE ONLY): 36 min   Charges:   PT Evaluation $PT Eval Low Complexity: 1 Low PT Treatments $Gait Training: 8-22 mins PT General Charges $$ ACUTE PT VISIT: 1 Visit          Faye Ramsay, PT Acute Rehabilitation  Office: [phone]

## 2023-03-05 NOTE — TOC Transition Note (Signed)
Transition of Care [facility]) - CM/SW Discharge Note  Patient Details  Name: Clayton [name] MRN: [id number] Date of Birth: 11/13/1955  [facility] CM/SW Contact:  Ewing Schlein, LCSW Phone Number: 03/05/2023, 10:31 AM  Clinical Narrative: Patient is expected to discharge home after working with PT. CSW met with patient to confirm discharge plan and needs. Patient will go home with a home exercise program (HEP). Patient will need a rolling walker, which [employer] delivered to patient's room. TOC signing off.    Final next level of care: Home/Self Care Barriers to Discharge: No Barriers Identified  Patient Goals and CMS Choice CMS Medicare.gov Compare Post Acute Care list provided to:: Patient Choice offered to / list presented to : Patient  Discharge Plan and Services Additional resources added to the After Visit Summary for         DME Arranged: Walker rolling DME Agency: [employer] Representative spoke with at DME Agency: Prearranged in orthopedist's office  Social Determinants of Health (SDOH) Interventions SDOH Screenings   Housing: Patient Declined (03/05/2023)  Depression (PHQ2-9): High Risk (05/31/2021)  Tobacco Use: Low Risk  (03/04/2023)   Readmission Risk Interventions     No data to display

## 2023-03-05 NOTE — Progress Notes (Signed)
Discharge package printed and instructions given to pt. Verbalizes understanding.

## 2023-03-05 NOTE — Plan of Care (Signed)
  Problem: Education: Goal: Ability to describe self-care measures that may prevent or decrease complications (Diabetes Survival Skills Education) will improve Outcome: Progressing Goal: Individualized Educational Video(s) Outcome: Progressing   Problem: Coping: Goal: Ability to adjust to condition or change in health will improve Outcome: Progressing   Problem: Fluid Volume: Goal: Ability to maintain a balanced intake and output will improve Outcome: Progressing   Problem: Health Behavior/Discharge Planning: Goal: Ability to identify and utilize available resources and services will improve Outcome: Progressing Goal: Ability to manage health-related needs will improve Outcome: Progressing   Problem: Metabolic: Goal: Ability to maintain appropriate glucose levels will improve Outcome: Progressing   Problem: Nutritional: Goal: Maintenance of adequate nutrition will improve Outcome: Progressing Goal: Progress toward achieving an optimal weight will improve Outcome: Progressing   Problem: Skin Integrity: Goal: Risk for impaired skin integrity will decrease Outcome: Progressing   Problem: Tissue Perfusion: Goal: Adequacy of tissue perfusion will improve Outcome: Progressing   Problem: Education: Goal: Knowledge of the prescribed therapeutic regimen will improve Outcome: Progressing Goal: Understanding of discharge needs will improve Outcome: Progressing Goal: Individualized Educational Video(s) Outcome: Progressing   Problem: Activity: Goal: Ability to avoid complications of mobility impairment will improve Outcome: Progressing Goal: Ability to tolerate increased activity will improve Outcome: Progressing   Problem: Clinical Measurements: Goal: Postoperative complications will be avoided or minimized Outcome: Progressing   Problem: Pain Management: Goal: Pain level will decrease with appropriate interventions Outcome: Progressing   Problem: Skin  Integrity: Goal: Will show signs of wound healing Outcome: Progressing   Problem: Education: Goal: Knowledge of General Education information will improve Description: Including pain rating scale, medication(s)/side effects and non-pharmacologic comfort measures Outcome: Progressing   Problem: Health Behavior/Discharge Planning: Goal: Ability to manage health-related needs will improve Outcome: Progressing   Problem: Clinical Measurements: Goal: Ability to maintain clinical measurements within normal limits will improve Outcome: Progressing Goal: Will remain free from infection Outcome: Progressing Goal: Diagnostic test results will improve Outcome: Progressing Goal: Respiratory complications will improve Outcome: Progressing Goal: Cardiovascular complication will be avoided Outcome: Progressing   Problem: Activity: Goal: Risk for activity intolerance will decrease Outcome: Progressing   Problem: Nutrition: Goal: Adequate nutrition will be maintained Outcome: Progressing   Problem: Coping: Goal: Level of anxiety will decrease Outcome: Progressing   Problem: Elimination: Goal: Will not experience complications related to bowel motility Outcome: Progressing Goal: Will not experience complications related to urinary retention Outcome: Progressing   Problem: Pain Managment: Goal: General experience of comfort will improve Outcome: Progressing   Problem: Safety: Goal: Ability to remain free from injury will improve Outcome: Progressing   Problem: Skin Integrity: Goal: Risk for impaired skin integrity will decrease Outcome: Progressing   

## 2023-03-11 NOTE — Discharge Summary (Signed)
Patient ID: Clayton [name] MRN: [id number] DOB/AGE: May 10, 1956 67 y.o.  Admit date: 03/04/2023 Discharge date: 03/05/2023  Admission Diagnoses:  Right hip osteoarthritis  Discharge Diagnoses:  Principal Problem:   S/P total right hip arthroplasty   Past Medical History:  Diagnosis Date   Anxiety    Arthritis    Asthma    pulmonary allergies- no asthma per pt   Atrophic condition of skin    Cellulitis/abscess - trunk    COPD (chronic obstructive pulmonary disease) (HCC)    Cyst    shoulder   Depression    Diabetes mellitus without complication (HCC)    Hyperlipidemia    Hypertension    Hypertrophic condition of skin    Lipoma    Obesity    Pneumonia    hx child   Sleep apnea    no cpap   Stones in the urinary tract    Vitamin D deficiency     Surgeries: Procedure(s): TOTAL HIP ARTHROPLASTY ANTERIOR APPROACH on 03/04/2023   Consultants:   Discharged Condition: Improved  Hospital Course: Tywone Olander is an 67 y.o. male who was admitted 03/04/2023 for operative treatment ofS/P total right hip arthroplasty. Patient has severe unremitting pain that affects sleep, daily activities, and work/hobbies. After pre-op clearance the patient was taken to the operating room on 03/04/2023 and underwent  Procedure(s): TOTAL HIP ARTHROPLASTY ANTERIOR APPROACH.    Patient was given perioperative antibiotics:  Anti-infectives (From admission, onward)    Start     Dose/Rate Route Frequency Ordered Stop   03/04/23 2200  ceFAZolin (ANCEF) IVPB 2g/100 mL premix        2 g 200 mL/hr over 30 Minutes Intravenous Every 6 hours 03/04/23 2012 03/05/23 0444   03/04/23 1430  ceFAZolin (ANCEF) IVPB 2g/100 mL premix        2 g 200 mL/hr over 30 Minutes Intravenous On call to O.R. 03/04/23 1423 03/04/23 1617        Patient was given sequential compression devices, early ambulation, and chemoprophylaxis to prevent DVT. Patient worked with PT and was meeting their goals regarding safe  ambulation and transfers.  Patient benefited maximally from hospital stay and there were no complications.    Recent vital signs: No data found.   Recent laboratory studies: No results for input(s): "WBC", "HGB", "HCT", "PLT", "NA", "K", "CL", "CO2", "BUN", "CREATININE", "GLUCOSE", "INR", "CALCIUM" in the last 72 hours.  Invalid input(s): "PT", "2"   Discharge Medications:   Allergies as of 03/05/2023   No Known Allergies      Medication List     STOP taking these medications    oxyCODONE-acetaminophen 10-325 MG tablet Commonly known as: PERCOCET       TAKE these medications    acetaminophen 500 MG tablet Commonly known as: TYLENOL Take 2,000 mg by mouth See admin instructions. Take 2000 mg in the morning, may take a second 2000 mg dose during the day as needed for pain   albuterol 108 (90 Base) MCG/ACT inhaler Commonly known as: VENTOLIN HFA Inhale 2 puffs into the lungs every 4 (four) hours as needed for wheezing or shortness of breath.   atorvastatin 40 MG tablet Commonly known as: LIPITOR Take 1 tablet by mouth daily.   calcium carbonate 500 MG chewable tablet Commonly known as: TUMS - dosed in mg elemental calcium Chew 2 tablets by mouth daily as needed for indigestion or heartburn.   celecoxib 200 MG capsule Commonly known as: CELEBREX Take 1 capsule (200 mg total) by  mouth 2 (two) times daily. Start once finished with Xarelto   clonazePAM 0.5 MG tablet Commonly known as: KLONOPIN Take 0.5 mg by mouth 2 (two) times daily.   diphenhydramine-acetaminophen 25-500 MG Tabs tablet Commonly known as: TYLENOL PM Take 2 tablets by mouth at bedtime.   ipratropium-albuterol 0.5-2.5 (3) MG/3ML Soln Commonly known as: DUONEB Take 3 mLs by nebulization every 6 (six) hours as needed (shortness of breath).   losartan 50 MG tablet Commonly known as: COZAAR Take 50 mg by mouth daily.   metFORMIN 500 MG 24 hr tablet Commonly known as: GLUCOPHAGE-XR Take 500 mg by  mouth 2 (two) times daily.   methocarbamol 500 MG tablet Commonly known as: ROBAXIN Take 1 tablet (500 mg total) by mouth every 6 (six) hours as needed for muscle spasms (muscle pain).   multivitamin with minerals Tabs tablet Take 1 tablet by mouth daily.   oxyCODONE 5 MG immediate release tablet Commonly known as: Oxy IR/ROXICODONE Take 1 tablet (5 mg total) by mouth every 4 (four) hours as needed for severe pain.   polyethylene glycol 17 g packet Commonly known as: MIRALAX / GLYCOLAX Take 17 g by mouth 2 (two) times daily.   rivaroxaban 10 MG Tabs tablet Commonly known as: XARELTO Take 1 tablet (10 mg total) by mouth daily with breakfast for 14 days. Afterwards, start aspirin 81 mg twice daily for another month.   senna 8.6 MG Tabs tablet Commonly known as: SENOKOT Take 2 tablets (17.2 mg total) by mouth at bedtime for 14 days.   sertraline 100 MG tablet Commonly known as: ZOLOFT Take 200 mg by mouth daily.   sildenafil 20 MG tablet Commonly known as: REVATIO Take 20-100 mg by mouth daily as needed (ED).   tamsulosin 0.4 MG Caps capsule Commonly known as: FLOMAX Take 0.4 mg by mouth daily.               Discharge Care Instructions  (From admission, onward)           Start     Ordered   03/05/23 0000  Change dressing       Comments: Maintain surgical dressing until follow up in the clinic. If the edges start to pull up, may reinforce with tape. If the dressing is no longer working, may remove and cover with gauze and tape, but must keep the area dry and clean.  Call with any questions or concerns.   03/05/23 0742            Diagnostic Studies: DG Pelvis Portable  Result Date: 03/04/2023 CLINICAL DATA:  Status post total hip arthroplasty. EXAM: PORTABLE PELVIS 1-2 VIEWS COMPARISON:  None Available. FINDINGS: Right hip arthroplasty in expected alignment. No periprosthetic lucency or fracture. Recent postsurgical change includes air and edema in the soft  tissues. IMPRESSION: Right hip arthroplasty without immediate postoperative complication. Electronically Signed   By: Narda Rutherford M.D.   On: 03/04/2023 18:46   DG HIP UNILAT WITH PELVIS 1V RIGHT  Result Date: 03/04/2023 CLINICAL DATA:  Elective surgery. EXAM: DG HIP (WITH OR WITHOUT PELVIS) 1V RIGHT COMPARISON:  None Available. FINDINGS: Eight fluoroscopic spot views of the pelvis and right hip obtained in the operating room. Sequential images during hip arthroplasty. Fluoroscopy time 14 seconds. Dose 2.97 mGy. IMPRESSION: Intraoperative fluoroscopy during right hip arthroplasty. Electronically Signed   By: Narda Rutherford M.D.   On: 03/04/2023 18:08   DG C-Arm 1-60 Min-No Report  Result Date: 03/04/2023 Fluoroscopy was utilized by the  requesting physician.  No radiographic interpretation.   DG C-Arm 1-60 Min-No Report  Result Date: 03/04/2023 Fluoroscopy was utilized by the requesting physician.  No radiographic interpretation.    Disposition: Discharge disposition: 01-Home or Self Care       Discharge Instructions     Call MD / Call 911   Complete by: As directed    If you experience chest pain or shortness of breath, CALL 911 and be transported to the hospital emergency room.  If you develope a fever above 101 F, pus (white drainage) or increased drainage or redness at the wound, or calf pain, call your surgeon's office.   Change dressing   Complete by: As directed    Maintain surgical dressing until follow up in the clinic. If the edges start to pull up, may reinforce with tape. If the dressing is no longer working, may remove and cover with gauze and tape, but must keep the area dry and clean.  Call with any questions or concerns.   Constipation Prevention   Complete by: As directed    Drink plenty of fluids.  Prune juice may be helpful.  You may use a stool softener, such as Colace (over the counter) 100 mg twice a day.  Use MiraLax (over the counter) for constipation as  needed.   Diet - low sodium heart healthy   Complete by: As directed    Increase activity slowly as tolerated   Complete by: As directed    Weight bearing as tolerated with assist device (walker, cane, etc) as directed, use it as long as suggested by your surgeon or therapist, typically at least 4-6 weeks.   Post-operative opioid taper instructions:   Complete by: As directed    POST-OPERATIVE OPIOID TAPER INSTRUCTIONS: It is important to wean off of your opioid medication as soon as possible. If you do not need pain medication after your surgery it is ok to stop day one. Opioids include: Codeine, Hydrocodone(Norco, Vicodin), Oxycodone(Percocet, oxycontin) and hydromorphone amongst others.  Long term and even short term use of opiods can cause: Increased pain response Dependence Constipation Depression Respiratory depression And more.  Withdrawal symptoms can include Flu like symptoms Nausea, vomiting And more Techniques to manage these symptoms Hydrate well Eat regular healthy meals Stay active Use relaxation techniques(deep breathing, meditating, yoga) Do Not substitute Alcohol to help with tapering If you have been on opioids for less than two weeks and do not have pain than it is ok to stop all together.  Plan to wean off of opioids This plan should start within one week post op of your joint replacement. Maintain the same interval or time between taking each dose and first decrease the dose.  Cut the total daily intake of opioids by one tablet each day Next start to increase the time between doses. The last dose that should be eliminated is the evening dose.      TED hose   Complete by: As directed    Use stockings (TED hose) for 2 weeks on both leg(s).  You may remove them at night for sleeping.        Follow-up Information     Durene Romans, MD. Schedule an appointment as soon as possible for a visit in 2 week(s).   Specialty: Orthopedic Surgery Contact  information: [address] [place] [address] [phone]                  Signed: Cassandria Anger 03/11/2023,  7:20 AM

## 2023-03-17 DIAGNOSIS — B079 Viral wart, unspecified: Secondary | ICD-10-CM | POA: Diagnosis not present

## 2023-03-17 DIAGNOSIS — L578 Other skin changes due to chronic exposure to nonionizing radiation: Secondary | ICD-10-CM | POA: Diagnosis not present

## 2023-03-17 DIAGNOSIS — D229 Melanocytic nevi, unspecified: Secondary | ICD-10-CM | POA: Diagnosis not present

## 2023-03-17 DIAGNOSIS — L821 Other seborrheic keratosis: Secondary | ICD-10-CM | POA: Diagnosis not present

## 2023-03-17 DIAGNOSIS — D1801 Hemangioma of skin and subcutaneous tissue: Secondary | ICD-10-CM | POA: Diagnosis not present

## 2023-03-17 DIAGNOSIS — L814 Other melanin hyperpigmentation: Secondary | ICD-10-CM | POA: Diagnosis not present

## 2023-03-25 DIAGNOSIS — N5201 Erectile dysfunction due to arterial insufficiency: Secondary | ICD-10-CM | POA: Diagnosis not present

## 2023-03-25 DIAGNOSIS — N401 Enlarged prostate with lower urinary tract symptoms: Secondary | ICD-10-CM | POA: Diagnosis not present

## 2023-03-25 DIAGNOSIS — R972 Elevated prostate specific antigen [PSA]: Secondary | ICD-10-CM | POA: Diagnosis not present

## 2023-03-25 DIAGNOSIS — R350 Frequency of micturition: Secondary | ICD-10-CM | POA: Diagnosis not present

## 2023-04-09 DIAGNOSIS — I10 Essential (primary) hypertension: Secondary | ICD-10-CM | POA: Diagnosis not present

## 2023-04-09 DIAGNOSIS — E119 Type 2 diabetes mellitus without complications: Secondary | ICD-10-CM | POA: Diagnosis not present

## 2023-04-09 DIAGNOSIS — I700 Atherosclerosis of aorta: Secondary | ICD-10-CM | POA: Diagnosis not present

## 2023-04-09 DIAGNOSIS — E1169 Type 2 diabetes mellitus with other specified complication: Secondary | ICD-10-CM | POA: Diagnosis not present

## 2023-04-10 ENCOUNTER — Other Ambulatory Visit: Payer: Self-pay | Admitting: Urology

## 2023-04-10 DIAGNOSIS — R972 Elevated prostate specific antigen [PSA]: Secondary | ICD-10-CM

## 2023-04-16 ENCOUNTER — Other Ambulatory Visit: Payer: Self-pay | Admitting: Urology

## 2023-04-16 DIAGNOSIS — R972 Elevated prostate specific antigen [PSA]: Secondary | ICD-10-CM

## 2023-04-17 DIAGNOSIS — Z96641 Presence of right artificial hip joint: Secondary | ICD-10-CM | POA: Diagnosis not present

## 2023-04-17 DIAGNOSIS — Z471 Aftercare following joint replacement surgery: Secondary | ICD-10-CM | POA: Diagnosis not present

## 2023-05-22 NOTE — Patient Instructions (Addendum)
SURGICAL WAITING ROOM VISITATION Patients having surgery or a procedure may have no more than 2 support people in the waiting area - these visitors may rotate.    Children under the age of 24 must have an adult with them who is not the patient.  If the patient needs to stay at the hospital during part of their recovery, the visitor guidelines for inpatient rooms apply. Pre-op nurse will coordinate an appropriate time for 1 support person to accompany patient in pre-op.  This support person may not rotate.    Please refer to the [facility] website for the visitor guidelines for Inpatients (after your surgery is over and you are in a regular room).       Your procedure is scheduled on: 06-10-23   Report to [facility] Main Entrance    Report to admitting at 6:10 AM   Call this number if you have problems the morning of surgery [phone]   Do not eat food :After Midnight.   After Midnight you may have the following liquids until 5:40 AM DAY OF SURGERY  Water Non-Citrus Juices (without pulp, NO RED-Apple, White grape, White cranberry) Black Coffee (NO MILK/CREAM OR CREAMERS, sugar ok)  Clear Tea (NO MILK/CREAM OR CREAMERS, sugar ok) regular and decaf                             Plain Jell-O (NO RED)                                           Fruit ices (not with fruit pulp, NO RED)                                     Popsicles (NO RED)                                                               Sports drinks like Gatorade (NO RED)                   The day of surgery:  Drink ONE (1) Pre-Surgery G2 by 5:40 AM the morning of surgery. Drink in one sitting. Do not sip.  This drink was given to you during your hospital  pre-op appointment visit. Nothing else to drink after completing the Pre-Surgery G2.          If you have questions, please contact your surgeon's office.   FOLLOW  ANY ADDITIONAL PRE OP INSTRUCTIONS YOU RECEIVED FROM YOUR SURGEON'S OFFICE!!!      Oral Hygiene is also important to reduce your risk of infection.                                    Remember - BRUSH YOUR TEETH THE MORNING OF SURGERY WITH YOUR REGULAR TOOTHPASTE   Do NOT smoke after Midnight   Take these medicines the morning of surgery with A SIP OF WATER:   Atorvastatin  Clonazepam  Sertraline  Tamsulosin  Okay to use inhalers  Oxycodone if needed  Stop all vitamins and herbal supplements 7 days before surgery  How to Manage Your Diabetes Before and After Surgery  Why is it important to control my blood sugar before and after surgery? Improving blood sugar levels before and after surgery helps healing and can limit problems. A way of improving blood sugar control is eating a healthy diet by:  Eating less sugar and carbohydrates  Increasing activity/exercise  Talking with your doctor about reaching your blood sugar goals High blood sugars (greater than 180 mg/dL) can raise your risk of infections and slow your recovery, so you will need to focus on controlling your diabetes during the weeks before surgery. Make sure that the doctor who takes care of your diabetes knows about your planned surgery including the date and location.  How do I manage my blood sugar before surgery? Check your blood sugar at least 4 times a day, starting 2 days before surgery, to make sure that the level is not too high or low. Check your blood sugar the morning of your surgery when you wake up and every 2 hours until you get to the Short Stay unit. If your blood sugar is less than 70 mg/dL, you will need to treat for low blood sugar: Do not take insulin. Treat a low blood sugar (less than 70 mg/dL) with  cup of clear juice (cranberry or apple), 4 glucose tablets, OR glucose gel. Recheck blood sugar in 15 minutes after treatment (to make sure it is greater than 70 mg/dL). If your blood sugar is not greater than 70 mg/dL on recheck, call [phone] for further instructions. Report  your blood sugar to the short stay nurse when you get to Short Stay.  If you are admitted to the hospital after surgery: Your blood sugar will be checked by the staff and you will probably be given insulin after surgery (instead of oral diabetes medicines) to make sure you have good blood sugar levels. The goal for blood sugar control after surgery is 80-180 mg/dL.   WHAT DO I DO ABOUT MY DIABETES MEDICATION?  Do not take oral diabetes medicines (pills) the morning of surgery (do not take Metformin)  DO NOT TAKE THE FOLLOWING 7 DAYS PRIOR TO SURGERY: Ozempic, Wegovy, Rybelsus (Semaglutide), Byetta (exenatide), Bydureon (exenatide ER), Victoza, Saxenda (liraglutide), or Trulicity (dulaglutide) Mounjaro (Tirzepatide) Adlyxin (Lixisenatide), Polyethylene Glycol Loxenatide.   Reviewed and Endorsed by [facility] Patient Education Committee, August 2015  Bring CPAP mask and tubing day of surgery.                              You may not have any metal on your body including  jewelry, and body piercing             Do not wear  lotions, powders, cologne, or deodorant              Men may shave face and neck.   Do not bring valuables to the hospital. [facility] IS NOT RESPONSIBLE   FOR VALUABLES.   Contacts, dentures or bridgework may not be worn into surgery.   Bring small overnight bag day of surgery.   DO NOT BRING YOUR HOME MEDICATIONS TO THE HOSPITAL. PHARMACY WILL DISPENSE MEDICATIONS LISTED ON YOUR MEDICATION LIST TO YOU DURING YOUR ADMISSION IN THE HOSPITAL!   Special Instructions: Bring a copy of your healthcare power of attorney and  living will documents the day of surgery if you haven't scanned them before.              Please read over the following fact sheets you were given: IF YOU HAVE QUESTIONS ABOUT YOUR PRE-OP INSTRUCTIONS PLEASE CALL [phone] [name]  If you received a COVID test during your pre-op visit  it is requested that you wear a mask when out in public, stay  away from anyone that may not be feeling well and notify your surgeon if you develop symptoms. If you test positive for Covid or have been in contact with anyone that has tested positive in the last 10 days please notify you surgeon.    Pre-operative 5 CHG Bath Instructions   You can play a key role in reducing the risk of infection after surgery. Your skin needs to be as free of germs as possible. You can reduce the number of germs on your skin by washing with CHG (chlorhexidine gluconate) soap before surgery. CHG is an antiseptic soap that kills germs and continues to kill germs even after washing.   DO NOT use if you have an allergy to chlorhexidine/CHG or antibacterial soaps. If your skin becomes reddened or irritated, stop using the CHG and notify one of our RNs at [phone].   Please shower with the CHG soap starting 4 days before surgery using the following schedule:     Please keep in mind the following:  DO NOT shave, including legs and underarms, starting the day of your first shower.   You may shave your face at any point before/day of surgery.  Place clean sheets on your bed the day you start using CHG soap. Use a clean washcloth (not used since being washed) for each shower. DO NOT sleep with pets once you start using the CHG.   CHG Shower Instructions:  If you choose to wash your hair and private area, wash first with your normal shampoo/soap.  After you use shampoo/soap, rinse your hair and body thoroughly to remove shampoo/soap residue.  Turn the water OFF and apply about 3 tablespoons (45 ml) of CHG soap to a CLEAN washcloth.  Apply CHG soap ONLY FROM YOUR NECK DOWN TO YOUR TOES (washing for 3-5 minutes)  DO NOT use CHG soap on face, private areas, open wounds, or sores.  Pay special attention to the area where your surgery is being performed.  If you are having back surgery, having someone wash your back for you may be helpful. Wait 2 minutes after CHG soap is  applied, then you may rinse off the CHG soap.  Pat dry with a clean towel  Put on clean clothes/pajamas   If you choose to wear lotion, please use ONLY the CHG-compatible lotions on the back of this paper.     Additional instructions for the day of surgery: DO NOT APPLY any lotions, deodorants, cologne, or perfumes.   Put on clean/comfortable clothes.  Brush your teeth.  Ask your nurse before applying any prescription medications to the skin.      CHG Compatible Lotions   Aveeno Moisturizing lotion  Cetaphil Moisturizing Cream  Cetaphil Moisturizing Lotion  Clairol Herbal Essence Moisturizing Lotion, Dry Skin  Clairol Herbal Essence Moisturizing Lotion, Extra Dry Skin  Clairol Herbal Essence Moisturizing Lotion, Normal Skin  Curel Age Defying Therapeutic Moisturizing Lotion with Alpha Hydroxy  Curel Extreme Care Body Lotion  Curel Soothing Hands Moisturizing Hand Lotion  Curel Therapeutic Moisturizing Cream, Fragrance-Free  Curel Therapeutic Moisturizing Lotion,  Fragrance-Free  Curel Therapeutic Moisturizing Lotion, Original Formula  Eucerin Daily Replenishing Lotion  Eucerin Dry Skin Therapy Plus Alpha Hydroxy Crme  Eucerin Dry Skin Therapy Plus Alpha Hydroxy Lotion  Eucerin Original Crme  Eucerin Original Lotion  Eucerin Plus Crme Eucerin Plus Lotion  Eucerin TriLipid Replenishing Lotion  Keri Anti-Bacterial Hand Lotion  Keri Deep Conditioning Original Lotion Dry Skin Formula Softly Scented  Keri Deep Conditioning Original Lotion, Fragrance Free Sensitive Skin Formula  Keri Lotion Fast Absorbing Fragrance Free Sensitive Skin Formula  Keri Lotion Fast Absorbing Softly Scented Dry Skin Formula  Keri Original Lotion  Keri Skin Renewal Lotion Keri Silky Smooth Lotion  Keri Silky Smooth Sensitive Skin Lotion  Nivea Body Creamy Conditioning Oil  Nivea Body Extra Enriched Lotion  Nivea Body Original Lotion  Nivea Body Sheer Moisturizing Lotion Nivea Crme  Nivea Skin  Firming Lotion  NutraDerm 30 Skin Lotion  NutraDerm Skin Lotion  NutraDerm Therapeutic Skin Cream  NutraDerm Therapeutic Skin Lotion  ProShield Protective Hand Cream  Provon moisturizing lotion   PATIENT SIGNATURE_________________________________  NURSE SIGNATURE__________________________________  ________________________________________________________________________    Clayton [name]  An incentive spirometer is a tool that can help keep your lungs clear and active. This tool measures how well you are filling your lungs with each breath. Taking long deep breaths may help reverse or decrease the chance of developing breathing (pulmonary) problems (especially infection) following: A long period of time when you are unable to move or be active. BEFORE THE PROCEDURE  If the spirometer includes an indicator to show your best effort, your nurse or respiratory therapist will set it to a desired goal. If possible, sit up straight or lean slightly forward. Try not to slouch. Hold the incentive spirometer in an upright position. INSTRUCTIONS FOR USE  Sit on the edge of your bed if possible, or sit up as far as you can in bed or on a chair. Hold the incentive spirometer in an upright position. Breathe out normally. Place the mouthpiece in your mouth and seal your lips tightly around it. Breathe in slowly and as deeply as possible, raising the piston or the ball toward the top of the column. Hold your breath for 3-5 seconds or for as long as possible. Allow the piston or ball to fall to the bottom of the column. Remove the mouthpiece from your mouth and breathe out normally. Rest for a few seconds and repeat Steps 1 through 7 at least 10 times every 1-2 hours when you are awake. Take your time and take a few normal breaths between deep breaths. The spirometer may include an indicator to show your best effort. Use the indicator as a goal to work toward during each repetition. After each  set of 10 deep breaths, practice coughing to be sure your lungs are clear. If you have an incision (the cut made at the time of surgery), support your incision when coughing by placing a pillow or rolled up towels firmly against it. Once you are able to get out of bed, walk around indoors and cough well. You may stop using the incentive spirometer when instructed by your caregiver.  RISKS AND COMPLICATIONS Take your time so you do not get dizzy or light-headed. If you are in pain, you may need to take or ask for pain medication before doing incentive spirometry. It is harder to take a deep breath if you are having pain. AFTER USE Rest and breathe slowly and easily. It can be helpful to keep track of  a log of your progress. Your caregiver can provide you with a simple table to help with this. If you are using the spirometer at home, follow these instructions: SEEK MEDICAL CARE IF:  You are having difficultly using the spirometer. You have trouble using the spirometer as often as instructed. Your pain medication is not giving enough relief while using the spirometer. You develop fever of 100.5 F (38.1 C) or higher. SEEK IMMEDIATE MEDICAL CARE IF:  You cough up bloody sputum that had not been present before. You develop fever of 102 F (38.9 C) or greater. You develop worsening pain at or near the incision site. MAKE SURE YOU:  Understand these instructions. Will watch your condition. Will get help right away if you are not doing well or get worse. Document Released: 12/02/2006 Document Revised: 10/14/2011 Document Reviewed: 02/02/2007 Exit[employer] Patient Information 2014 Exit[employer], [place].   ________________________________________________________________________ WHAT IS A BLOOD TRANSFUSION? Blood Transfusion Information  A transfusion is the replacement of blood or some of its parts. Blood is made up of multiple cells which provide different functions. Red blood cells carry oxygen and are  used for blood loss replacement. White blood cells fight against infection. Platelets control bleeding. Plasma helps clot blood. Other blood products are available for specialized needs, such as hemophilia or other clotting disorders. BEFORE THE TRANSFUSION  Who gives blood for transfusions?  Healthy volunteers who are fully evaluated to make sure their blood is safe. This is blood bank blood. Transfusion therapy is the safest it has ever been in the practice of medicine. Before blood is taken from a donor, a complete history is taken to make sure that person has no history of diseases nor engages in risky social behavior (examples are intravenous drug use or sexual activity with multiple partners). The donor's travel history is screened to minimize risk of transmitting infections, such as malaria. The donated blood is tested for signs of infectious diseases, such as HIV and hepatitis. The blood is then tested to be sure it is compatible with you in order to minimize the chance of a transfusion reaction. If you or a relative donates blood, this is often done in anticipation of surgery and is not appropriate for emergency situations. It takes many days to process the donated blood. RISKS AND COMPLICATIONS Although transfusion therapy is very safe and saves many lives, the main dangers of transfusion include:  Getting an infectious disease. Developing a transfusion reaction. This is an allergic reaction to something in the blood you were given. Every precaution is taken to prevent this. The decision to have a blood transfusion has been considered carefully by your caregiver before blood is given. Blood is not given unless the benefits outweigh the risks. AFTER THE TRANSFUSION Right after receiving a blood transfusion, you will usually feel much better and more energetic. This is especially true if your red blood cells have gotten low (anemic). The transfusion raises the level of the red blood cells  which carry oxygen, and this usually causes an energy increase. The nurse administering the transfusion will monitor you carefully for complications. HOME CARE INSTRUCTIONS  No special instructions are needed after a transfusion. You may find your energy is better. Speak with your caregiver about any limitations on activity for underlying diseases you may have. SEEK MEDICAL CARE IF:  Your condition is not improving after your transfusion. You develop redness or irritation at the intravenous (IV) site. SEEK IMMEDIATE MEDICAL CARE IF:  Any of the following symptoms occur over  the next 12 hours: Shaking chills. You have a temperature by mouth above 102 F (38.9 C), not controlled by medicine. Chest, back, or muscle pain. People around you feel you are not acting correctly or are confused. Shortness of breath or difficulty breathing. Dizziness and fainting. You get a rash or develop hives. You have a decrease in urine output. Your urine turns a dark color or changes to pink, red, or brown. Any of the following symptoms occur over the next 10 days: You have a temperature by mouth above 102 F (38.9 C), not controlled by medicine. Shortness of breath. Weakness after normal activity. The white part of the eye turns yellow (jaundice). You have a decrease in the amount of urine or are urinating less often. Your urine turns a dark color or changes to pink, red, or brown. Document Released: 07/19/2000 Document Revised: 10/14/2011 Document Reviewed: 03/07/2008 [facility] Patient Information 2014 [place], [place].  _______________________________________________________________________

## 2023-05-26 ENCOUNTER — Ambulatory Visit
Admission: RE | Admit: 2023-05-26 | Discharge: 2023-05-26 | Disposition: A | Discharge status: Home / Self Care | Payer: PPO | Source: Ambulatory Visit | Attending: Urology

## 2023-05-26 ENCOUNTER — Ambulatory Visit
Admission: RE | Admit: 2023-05-26 | Discharge: 2023-05-26 | Disposition: A | Discharge status: Home / Self Care | Payer: PPO | Source: Ambulatory Visit | Attending: Urology | Admitting: Urology

## 2023-05-26 DIAGNOSIS — R972 Elevated prostate specific antigen [PSA]: Secondary | ICD-10-CM

## 2023-05-26 DIAGNOSIS — Z0189 Encounter for other specified special examinations: Secondary | ICD-10-CM | POA: Diagnosis not present

## 2023-05-26 MED ORDER — GADOPICLENOL 0.5 MMOL/ML IV SOLN
10.0000 mL | Freq: Once | INTRAVENOUS | Status: AC | PRN
  Administered 2023-05-26: 10 mL via INTRAVENOUS

## 2023-05-27 NOTE — Progress Notes (Addendum)
COVID Vaccine Completed:  Date of COVID positive in last 90 days:  PCP - Peri Maris, FNP Cardiologist - Lennie Odor, MD Pulmonologist - Melody Comas, MD  Cardiac clearance in Epic dated 02-13-23 by Dr. Flora Lipps  Pulmonary clearance (intermediate risk) in Epic dated 02-25-23 by Dr. Francine Graven  Chest x-ray -  EKG - 02-13-23 Epic Stress Test -  ECHO - 08-16-20 Epic Cardiac Cath -  Pacemaker/ICD device last checked: Spinal Cord Stimulator:  Bowel Prep -   Sleep Study - Yes, +sleep apnea CPAP -   Fasting Blood Sugar -  Checks Blood Sugar _____ times a day  Last dose of GLP1 agonist-  N/A GLP1 instructions:  N/A   Last dose of SGLT-2 inhibitors-  N/A SGLT-2 instructions: N/A   Blood Thinner Instructions:  Xarelto Aspirin Instructions: Last Dose:  Activity level:  Can go up a flight of stairs and perform activities of daily living without stopping and without symptoms of chest pain or shortness of breath.  Able to exercise without symptoms  Unable to go up a flight of stairs without symptoms of     Anesthesia review: COPD, restrictive lung disease, HTN, OSA, DM  Patient denies shortness of breath, fever, cough and chest pain at PAT appointment  Patient verbalized understanding of instructions that were given to them at the PAT appointment. Patient was also instructed that they will need to review over the PAT instructions again at home before surgery.

## 2023-05-29 ENCOUNTER — Encounter (HOSPITAL_COMMUNITY)
Admission: RE | Admit: 2023-05-29 | Discharge: 2023-05-29 | Disposition: A | Discharge status: Home / Self Care | Payer: PPO | Source: Ambulatory Visit | Attending: Orthopedic Surgery | Admitting: Orthopedic Surgery

## 2023-05-29 ENCOUNTER — Encounter (HOSPITAL_COMMUNITY): Payer: Self-pay

## 2023-05-29 ENCOUNTER — Other Ambulatory Visit: Payer: Self-pay

## 2023-05-29 VITALS — BP 141/71 | HR 94 | Temp 98.4°F | Resp 16 | Ht 69.0 in | Wt 246.0 lb

## 2023-05-29 DIAGNOSIS — I10 Essential (primary) hypertension: Secondary | ICD-10-CM | POA: Diagnosis not present

## 2023-05-29 DIAGNOSIS — E119 Type 2 diabetes mellitus without complications: Secondary | ICD-10-CM | POA: Insufficient documentation

## 2023-05-29 DIAGNOSIS — Z01812 Encounter for preprocedural laboratory examination: Secondary | ICD-10-CM | POA: Insufficient documentation

## 2023-05-29 DIAGNOSIS — M1612 Unilateral primary osteoarthritis, left hip: Secondary | ICD-10-CM | POA: Insufficient documentation

## 2023-05-29 DIAGNOSIS — Z01818 Encounter for other preprocedural examination: Secondary | ICD-10-CM

## 2023-05-29 HISTORY — DX: Benign prostatic hyperplasia without lower urinary tract symptoms: N40.0

## 2023-05-29 HISTORY — DX: Dyspnea, unspecified: R06.00

## 2023-05-29 LAB — CBC
HCT: 43.6 % (ref 39.0–52.0)
Hemoglobin: 13.9 g/dL (ref 13.0–17.0)
MCH: 30.3 pg (ref 26.0–34.0)
MCHC: 31.9 g/dL (ref 30.0–36.0)
MCV: 95.2 fL (ref 80.0–100.0)
Platelets: 264 10*3/uL (ref 150–400)
RBC: 4.58 MIL/uL (ref 4.22–5.81)
RDW: 13.4 % (ref 11.5–15.5)
WBC: 9.1 10*3/uL (ref 4.0–10.5)
nRBC: 0 % (ref 0.0–0.2)

## 2023-05-29 LAB — SURGICAL PCR SCREEN
MRSA, PCR: NEGATIVE
Staphylococcus aureus: NEGATIVE

## 2023-05-29 LAB — BASIC METABOLIC PANEL
Anion gap: 11 (ref 5–15)
BUN: 17 mg/dL (ref 8–23)
CO2: 22 mmol/L (ref 22–32)
Calcium: 8.9 mg/dL (ref 8.9–10.3)
Chloride: 106 mmol/L (ref 98–111)
Creatinine, Ser: 0.86 mg/dL (ref 0.61–1.24)
GFR, Estimated: >60 mL/min (ref >60)
Glucose, Bld: 237 mg/dL — ABNORMAL HIGH (ref 70–99)
Potassium: 3.9 mmol/L (ref 3.5–5.1)
Sodium: 139 mmol/L (ref 135–145)

## 2023-05-29 LAB — TYPE AND SCREEN
ABO/RH(D): O POS
Antibody Screen: NEGATIVE

## 2023-05-29 LAB — HEMOGLOBIN A1C
Hgb A1c MFr Bld: 7.4 % — ABNORMAL HIGH (ref 4.8–5.6)
Mean Plasma Glucose: 165.68 mg/dL

## 2023-05-29 LAB — GLUCOSE, CAPILLARY: Glucose-Capillary: 223 mg/dL — ABNORMAL HIGH (ref 70–99)

## 2023-06-04 DIAGNOSIS — G4733 Obstructive sleep apnea (adult) (pediatric): Secondary | ICD-10-CM | POA: Diagnosis not present

## 2023-06-06 NOTE — Anesthesia Preprocedure Evaluation (Signed)
Anesthesia Evaluation  Patient identified by MRN, date of birth, ID band Patient awake    Reviewed: Allergy & Precautions, NPO status , Patient's Chart, lab work & pertinent test results  Airway Mallampati: III  TM Distance: >3 FB Neck ROM: Full    Dental  (+) Teeth Intact, Dental Advisory Given   Pulmonary sleep apnea and Continuous Positive Airway Pressure Ventilation , COPD (hasnt used inhaler in a while, coughing in preop),  COPD inhaler URI about 4 weeks ago    Pulmonary exam normal breath sounds clear to auscultation       Cardiovascular hypertension (poorly controlled, 179/95 preop- didnt take BP meds yesterday or this AM), Pt. on medications Normal cardiovascular exam Rhythm:Regular Rate:Normal  Echo 2022  1. Left ventricular ejection fraction, by estimation, is 60 to 65%. The  left ventricle has normal function. The left ventricle has no regional  wall motion abnormalities. Left ventricular diastolic parameters are  consistent with Grade I diastolic  dysfunction (impaired relaxation).   2. Right ventricular systolic function is normal. The right ventricular  size is mildly enlarged. There is normal pulmonary artery systolic  pressure. The estimated right ventricular systolic pressure is 24.2 mmHg.   3. The mitral valve is normal in structure. Trivial mitral valve  regurgitation.   4. The aortic valve is tricuspid. There is mild calcification of the  aortic valve. There is mild thickening of the aortic valve. Aortic valve  regurgitation is not visualized.     Neuro/Psych  PSYCHIATRIC DISORDERS Anxiety Depression    negative neurological ROS     GI/Hepatic Neg liver ROS,GERD  Controlled,,  Endo/Other  diabetes, Well Controlled, Type 2, Oral Hypoglycemic Agents  BMI 36  Renal/GU negative Renal ROS  negative genitourinary   Musculoskeletal  (+) Arthritis , Osteoarthritis,    Abdominal  (+) + obese  Peds   Hematology Hb 13.9   Anesthesia Other Findings   Reproductive/Obstetrics negative OB ROS                             Anesthesia Physical Anesthesia Plan  ASA: 3  Anesthesia Plan: Spinal and MAC   Post-op Pain Management: Tylenol PO (pre-op)*   Induction:   PONV Risk Score and Plan: 2 and Propofol infusion and TIVA  Airway Management Planned: Natural Airway and Nasal Cannula  Additional Equipment: None  Intra-op Plan:   Post-operative Plan:   Informed Consent: I have reviewed the patients History and Physical, chart, labs and discussed the procedure including the risks, benefits and alternatives for the proposed anesthesia with the patient or authorized representative who has indicated his/her understanding and acceptance.       Plan Discussed with: CRNA  Anesthesia Plan Comments:        Anesthesia Quick Evaluation

## 2023-06-09 NOTE — H&P (Signed)
TOTAL HIP ADMISSION H&P  Patient is admitted for left total hip arthroplasty.  Therapy Plans: HEP Disposition: Home with wife Planned DVT Prophylaxis: Xarelto 10mg  daily DME needed: none PCP: Dr. Meridee Score - seeing on Friday for clearance & blood work TXA: IV Allergies: NKDA Anesthesia Concerns: none BMI: 38.4 Last HgbA1c: 7.6%   Other: - oxycodone, robaxin, celebrex - hx of COPD after COVID, DVT - Undergoing prostate biopsy soon  Subjective:  Chief Complaint: left hip pain  HPI: Clayton [name], 67 y.o. male, has a history of pain and functional disability in the left hip(s) due to arthritis and patient has failed non-surgical conservative treatments for greater than 12 weeks to include NSAID's and/or analgesics and activity modification.  Onset of symptoms was gradual starting 2 years ago with gradually worsening course since that time.The patient noted no past surgery on the left hip(s).  Patient currently rates pain in the left hip at 8 out of 10 with activity. Patient has worsening of pain with activity and weight bearing, pain that interfers with activities of daily living, and pain with passive range of motion. Patient has evidence of joint space narrowing by imaging studies. This condition presents safety issues increasing the risk of falls.  There is no current active infection.  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/04/2023   OSA (obstructive sleep apnea) 01/15/2021   Chronic respiratory failure with hypoxia (HCC) 01/15/2021   Shortness of breath 01/15/2021   Physical deconditioning 01/15/2021   Malnutrition of moderate degree 05/18/2020   Uncontrolled type 2 diabetes mellitus with hyperglycemia (HCC) 04/25/2020   Acute hypoxemic respiratory failure due to COVID-19 [facility]) 04/24/2020   Hypokalemia 04/24/2020   Hyperglycemia 04/24/2020   Acute respiratory failure with hypoxia (HCC)    Suspected COVID-19 virus infection     S/P hernia repair 05/23/2015   Chronic cough 06/25/2011   Lipoma 04/04/2008   Allergic rhinitis 12/21/2007   GERD 12/21/2007   NEPHROLITHIASIS, HX OF 12/21/2007   Past Medical History:  Diagnosis Date   Anxiety    Arthritis    Asthma    pulmonary allergies- no asthma per pt   Atrophic condition of skin    Cellulitis/abscess - trunk    COPD (chronic obstructive pulmonary disease) (HCC)    Cyst    shoulder   Depression    Diabetes mellitus without complication (HCC)    Dyspnea    Enlarged prostate    Hyperlipidemia    Hypertension    Hypertrophic condition of skin    Lipoma    Obesity    Pneumonia    hx child   Sleep apnea    no cpap   Stones in the urinary tract    Vitamin D deficiency     Past Surgical History:  Procedure Laterality Date   cyst removed from tailbone  1980   INSERTION OF MESH N/A 05/23/2015   Procedure: INSERTION OF MESH;  Surgeon: Axel Filler, MD;  Location: [facility] OR;  Service: General;  Laterality: N/A;   MASS EXCISION  07/15/11   left shoulder mass    NASAL SINUS SURGERY---UVULA REMOVED  2004   Dr. Annalee Genta   PROSTATE BIOPSY  9/16   TONSILLECTOMY     TOTAL HIP ARTHROPLASTY Right 03/04/2023   Procedure: TOTAL HIP ARTHROPLASTY ANTERIOR APPROACH;  Surgeon: Durene Romans, MD;  Location: [facility] ORS;  Service: Orthopedics;  Laterality: Right;   UMBILICAL HERNIA REPAIR N/A 05/23/2015   Procedure:  LAPAROSCOPIC UMBILICAL HERNIA REPAIR ;  Surgeon: Axel Filler, MD;  Location: [facility] OR;  Service: General;  Laterality: N/A;    No current facility-administered medications for this encounter.   Current Outpatient Medications  Medication Sig Dispense Refill Last Dose   Acetaminophen 500 MG capsule Take 1,000 mg by mouth every 6 (six) hours as needed for moderate pain (pain score 4-6). Take 2000 mg in the morning, may take a second 2000 mg dose during the day as needed for pain      albuterol (VENTOLIN HFA) 108 (90 Base) MCG/ACT inhaler Inhale 2 puffs into  the lungs every 4 (four) hours as needed for wheezing or shortness of breath. 1 each 0    atorvastatin (LIPITOR) 40 MG tablet Take 40 mg by mouth daily.      calcium carbonate (TUMS - DOSED IN MG ELEMENTAL CALCIUM) 500 MG chewable tablet Chew 2 tablets by mouth daily as needed for indigestion or heartburn.      clonazePAM (KLONOPIN) 0.5 MG tablet Take 0.5 mg by mouth 2 (two) times daily.      diphenhydramine-acetaminophen (TYLENOL PM) 25-500 MG TABS tablet Take 2 tablets by mouth at bedtime as needed (sleep).      ipratropium-albuterol (DUONEB) 0.5-2.5 (3) MG/3ML SOLN Take 3 mLs by nebulization every 6 (six) hours as needed (shortness of breath).      losartan (COZAAR) 50 MG tablet Take 50 mg by mouth daily.      metFORMIN (GLUCOPHAGE-XR) 500 MG 24 hr tablet Take 1,000 mg by mouth 2 (two) times daily with a meal.      methocarbamol (ROBAXIN) 500 MG tablet Take 1 tablet (500 mg total) by mouth every 6 (six) hours as needed for muscle spasms (muscle pain). (Patient taking differently: Take 500 mg by mouth in the morning and at bedtime.) 40 tablet 2    Multiple Vitamin (MULTIVITAMIN WITH MINERALS) TABS tablet Take 1 tablet by mouth daily.      NON FORMULARY Pt uses cpap      polyethylene glycol (MIRALAX / GLYCOLAX) 17 g packet Take 17 g by mouth 2 (two) times daily. (Patient taking differently: Take 17 g by mouth daily as needed for moderate constipation.) 14 each 0    sertraline (ZOLOFT) 100 MG tablet Take 200 mg by mouth daily.      tamsulosin (FLOMAX) 0.4 MG CAPS capsule Take 0.4 mg by mouth daily.      celecoxib (CELEBREX) 200 MG capsule Take 1 capsule (200 mg total) by mouth 2 (two) times daily. Start once finished with Xarelto (Patient not taking: Reported on 05/27/2023) 60 capsule 0 Not Taking   oxyCODONE (OXY IR/ROXICODONE) 5 MG immediate release tablet Take 1 tablet (5 mg total) by mouth every 4 (four) hours as needed for severe pain. (Patient not taking: Reported on 05/27/2023) 42 tablet 0 Not  Taking   rivaroxaban (XARELTO) 10 MG TABS tablet Take 1 tablet (10 mg total) by mouth daily with breakfast for 14 days. Afterwards, start aspirin 81 mg twice daily for another month. (Patient not taking: Reported on 05/27/2023) 14 tablet 0 Not Taking   sildenafil (REVATIO) 20 MG tablet Take 20-100 mg by mouth daily as needed (ED).      No Known Allergies  Social History   Tobacco Use   Smoking status: Never   Smokeless tobacco: Never  Substance Use Topics   Alcohol use: Yes    Comment: Occasional    Family History  Problem Relation Age of Onset  Heart attack Father    Heart disease Father        heart attack   Allergies Mother    Asthma Mother      Review of Systems  Constitutional:  Negative for chills and fever.  Respiratory:  Negative for cough and shortness of breath.   Cardiovascular:  Negative for chest pain.  Gastrointestinal:  Negative for nausea and vomiting.  Musculoskeletal:  Positive for arthralgias.     Objective:   Physical Exam Well nourished and well developed. General: Alert and oriented x3, cooperative and pleasant, no acute distress. Head: normocephalic, atraumatic, neck supple. Eyes: EOMI.  Musculoskeletal: Left Hip: Pain with passive hip ROM No lateral tenderness No lower extremity edema  Calves soft and nontender. Motor function intact in LE. Strength 5/5 LE bilaterally. Neuro: Distal pulses 2+. Sensation to light touch intact in LE.  Vital signs in last 24 hours:    Labs:   Estimated body mass index is 36.33 kg/m as calculated from the following:   Height as of 05/29/23: 5\' 9"  (1.753 m).   Weight as of 05/29/23: 111.6 kg.   Imaging Review Plain radiographs demonstrate severe degenerative joint disease of the left hip(s). The bone quality appears to be adequate for age and reported activity level.      Assessment/Plan:  End stage arthritis, left hip(s)  The patient history, physical examination, clinical judgement of the  provider and imaging studies are consistent with end stage degenerative joint disease of the left hip(s) and total hip arthroplasty is deemed medically necessary. The treatment options including medical management, injection therapy, arthroscopy and arthroplasty were discussed at length. The risks and benefits of total hip arthroplasty were presented and reviewed. The risks due to aseptic loosening, infection, stiffness, dislocation/subluxation,  thromboembolic complications and other imponderables were discussed.  The patient acknowledged the explanation, agreed to proceed with the plan and consent was signed. Patient is being admitted for inpatient treatment for surgery, pain control, PT, OT, prophylactic antibiotics, VTE prophylaxis, progressive ambulation and ADL's and discharge planning.The patient is planning to be discharged  home.    Patient's anticipated LOS is less than 2 midnights, meeting these requirements: - Younger than 39 - Lives within 1 hour of care - Has a competent adult at home to recover with post-op recover - NO history of  - Chronic pain requiring opiods  - Diabetes  - Coronary Artery Disease  - Heart failure  - Heart attack  - Stroke  - DVT/VTE  - Cardiac arrhythmia  - Respiratory Failure/COPD  - Renal failure  - Anemia  - Advanced Liver disease  Rosalene Billings, PA-C Orthopedic Surgery [facility] [place] [phone]

## 2023-06-10 ENCOUNTER — Other Ambulatory Visit: Payer: Self-pay

## 2023-06-10 ENCOUNTER — Ambulatory Visit (HOSPITAL_BASED_OUTPATIENT_CLINIC_OR_DEPARTMENT_OTHER): Payer: Self-pay | Admitting: Anesthesiology

## 2023-06-10 ENCOUNTER — Observation Stay (HOSPITAL_COMMUNITY)
Admission: RE | Admit: 2023-06-10 | Discharge: 2023-06-11 | Disposition: A | Discharge status: Home / Self Care | Payer: PPO | Source: Ambulatory Visit | Attending: Orthopedic Surgery | Admitting: Orthopedic Surgery

## 2023-06-10 ENCOUNTER — Observation Stay (HOSPITAL_COMMUNITY): Payer: PPO

## 2023-06-10 ENCOUNTER — Ambulatory Visit (HOSPITAL_COMMUNITY): Payer: PPO

## 2023-06-10 ENCOUNTER — Encounter (HOSPITAL_COMMUNITY)
Admission: RE | Disposition: A | Discharge status: Home / Self Care | Payer: Self-pay | Source: Ambulatory Visit | Attending: Orthopedic Surgery

## 2023-06-10 ENCOUNTER — Ambulatory Visit (HOSPITAL_COMMUNITY): Payer: PPO | Admitting: Physician Assistant

## 2023-06-10 ENCOUNTER — Encounter (HOSPITAL_COMMUNITY): Payer: Self-pay | Admitting: Orthopedic Surgery

## 2023-06-10 DIAGNOSIS — G4733 Obstructive sleep apnea (adult) (pediatric): Secondary | ICD-10-CM

## 2023-06-10 DIAGNOSIS — Z7984 Long term (current) use of oral hypoglycemic drugs: Secondary | ICD-10-CM | POA: Diagnosis not present

## 2023-06-10 DIAGNOSIS — Z01818 Encounter for other preprocedural examination: Secondary | ICD-10-CM

## 2023-06-10 DIAGNOSIS — M1612 Unilateral primary osteoarthritis, left hip: Principal | ICD-10-CM | POA: Insufficient documentation

## 2023-06-10 DIAGNOSIS — Z471 Aftercare following joint replacement surgery: Secondary | ICD-10-CM | POA: Diagnosis not present

## 2023-06-10 DIAGNOSIS — E119 Type 2 diabetes mellitus without complications: Secondary | ICD-10-CM | POA: Insufficient documentation

## 2023-06-10 DIAGNOSIS — Z96641 Presence of right artificial hip joint: Secondary | ICD-10-CM | POA: Insufficient documentation

## 2023-06-10 DIAGNOSIS — Z8616 Personal history of COVID-19: Secondary | ICD-10-CM | POA: Insufficient documentation

## 2023-06-10 DIAGNOSIS — Z96642 Presence of left artificial hip joint: Secondary | ICD-10-CM

## 2023-06-10 DIAGNOSIS — I10 Essential (primary) hypertension: Secondary | ICD-10-CM | POA: Insufficient documentation

## 2023-06-10 DIAGNOSIS — Z79899 Other long term (current) drug therapy: Secondary | ICD-10-CM | POA: Insufficient documentation

## 2023-06-10 DIAGNOSIS — Z7901 Long term (current) use of anticoagulants: Secondary | ICD-10-CM | POA: Insufficient documentation

## 2023-06-10 DIAGNOSIS — J449 Chronic obstructive pulmonary disease, unspecified: Secondary | ICD-10-CM | POA: Diagnosis not present

## 2023-06-10 HISTORY — PX: TOTAL HIP ARTHROPLASTY: SHX124

## 2023-06-10 LAB — GLUCOSE, CAPILLARY
Glucose-Capillary: 175 mg/dL — ABNORMAL HIGH (ref 70–99)
Glucose-Capillary: 187 mg/dL — ABNORMAL HIGH (ref 70–99)
Glucose-Capillary: 158 mg/dL — ABNORMAL HIGH (ref 70–99)
Glucose-Capillary: 234 mg/dL — ABNORMAL HIGH (ref 70–99)
Glucose-Capillary: 251 mg/dL — ABNORMAL HIGH (ref 70–99)

## 2023-06-10 LAB — ABO/RH: ABO/RH(D): O POS

## 2023-06-10 SURGERY — ARTHROPLASTY, HIP, TOTAL, ANTERIOR APPROACH
Anesthesia: Monitor Anesthesia Care | Site: Hip | Laterality: Left

## 2023-06-10 MED ORDER — SODIUM CHLORIDE (PF) 0.9 % IJ SOLN
INTRAMUSCULAR | Status: AC
  Filled 2023-06-10: qty 50

## 2023-06-10 MED ORDER — BUPIVACAINE-EPINEPHRINE 0.25% -1:200000 IJ SOLN
INTRAMUSCULAR | Status: AC
  Filled 2023-06-10: qty 1

## 2023-06-10 MED ORDER — ONDANSETRON HCL 4 MG PO TABS: 4.0000 mg | ORAL_TABLET | Freq: Four times a day (QID) | ORAL | Status: DC | PRN

## 2023-06-10 MED ORDER — IPRATROPIUM-ALBUTEROL 0.5-2.5 (3) MG/3ML IN SOLN: 3.0000 mL | Freq: Four times a day (QID) | RESPIRATORY_TRACT | Status: DC | PRN

## 2023-06-10 MED ORDER — POVIDONE-IODINE 10 % EX SWAB: 2.0000 | Freq: Once | CUTANEOUS | Status: DC

## 2023-06-10 MED ORDER — LIDOCAINE HCL (PF) 2 % IJ SOLN
INTRAMUSCULAR | Status: AC
  Filled 2023-06-10: qty 5

## 2023-06-10 MED ORDER — ACETAMINOPHEN 500 MG PO TABS
1000.0000 mg | ORAL_TABLET | Freq: Once | ORAL | Status: AC
  Administered 2023-06-10: 1000 mg via ORAL
  Filled 2023-06-10: qty 2

## 2023-06-10 MED ORDER — AMISULPRIDE (ANTIEMETIC) 5 MG/2ML IV SOLN: 10.0000 mg | Freq: Once | INTRAVENOUS | Status: DC | PRN

## 2023-06-10 MED ORDER — BUPIVACAINE-EPINEPHRINE (PF) 0.25% -1:200000 IJ SOLN
INTRAMUSCULAR | Status: DC | PRN
  Administered 2023-06-10: 30 mL

## 2023-06-10 MED ORDER — ALBUMIN HUMAN 5 % IV SOLN
INTRAVENOUS | Status: DC | PRN
Start: 2023-06-10 — End: 2023-06-10

## 2023-06-10 MED ORDER — OXYCODONE HCL 5 MG PO TABS
5.0000 mg | ORAL_TABLET | Freq: Once | ORAL | Status: AC | PRN
  Administered 2023-06-10: 5 mg via ORAL

## 2023-06-10 MED ORDER — ONDANSETRON HCL 4 MG/2ML IJ SOLN
INTRAMUSCULAR | Status: DC | PRN
  Administered 2023-06-10: 4 mg via INTRAVENOUS

## 2023-06-10 MED ORDER — PROPOFOL 10 MG/ML IV BOLUS
INTRAVENOUS | Status: AC
  Filled 2023-06-10: qty 20

## 2023-06-10 MED ORDER — BISACODYL 10 MG RE SUPP: 10.0000 mg | Freq: Every day | RECTAL | Status: DC | PRN

## 2023-06-10 MED ORDER — LACTATED RINGERS IV SOLN: INTRAVENOUS | Status: DC

## 2023-06-10 MED ORDER — INSULIN ASPART 100 UNIT/ML IJ SOLN
INTRAMUSCULAR | Status: AC
  Filled 2023-06-10: qty 1

## 2023-06-10 MED ORDER — METHOCARBAMOL 1000 MG/10ML IJ SOLN: 500.0000 mg | Freq: Four times a day (QID) | INTRAMUSCULAR | Status: DC | PRN

## 2023-06-10 MED ORDER — PROPOFOL 10 MG/ML IV BOLUS
INTRAVENOUS | Status: DC | PRN
  Administered 2023-06-10 (×2): 20 mg via INTRAVENOUS

## 2023-06-10 MED ORDER — POLYETHYLENE GLYCOL 3350 17 G PO PACK
17.0000 g | PACK | Freq: Two times a day (BID) | ORAL | Status: DC
  Administered 2023-06-10 – 2023-06-11 (×2): 17 g via ORAL
  Filled 2023-06-10 (×3): qty 1

## 2023-06-10 MED ORDER — SENNA 8.6 MG PO TABS
2.0000 | ORAL_TABLET | Freq: Every day | ORAL | Status: DC
  Administered 2023-06-10: 17.2 mg via ORAL
  Filled 2023-06-10: qty 2

## 2023-06-10 MED ORDER — MIDAZOLAM HCL 2 MG/2ML IJ SOLN
INTRAMUSCULAR | Status: AC
  Filled 2023-06-10: qty 2

## 2023-06-10 MED ORDER — KETOROLAC TROMETHAMINE 30 MG/ML IJ SOLN
INTRAMUSCULAR | Status: DC | PRN
  Administered 2023-06-10: 30 mg

## 2023-06-10 MED ORDER — SERTRALINE HCL 100 MG PO TABS
200.0000 mg | ORAL_TABLET | Freq: Every day | ORAL | Status: DC
  Administered 2023-06-10 – 2023-06-11 (×2): 200 mg via ORAL
  Filled 2023-06-10 (×2): qty 2

## 2023-06-10 MED ORDER — CEFAZOLIN SODIUM-DEXTROSE 2-4 GM/100ML-% IV SOLN
2.0000 g | INTRAVENOUS | Status: AC
  Administered 2023-06-10: 2 g via INTRAVENOUS
  Filled 2023-06-10: qty 100

## 2023-06-10 MED ORDER — CALCIUM CARBONATE ANTACID 500 MG PO CHEW: 2.0000 | CHEWABLE_TABLET | Freq: Every day | ORAL | Status: DC | PRN

## 2023-06-10 MED ORDER — MENTHOL 3 MG MT LOZG
1.0000 | LOZENGE | OROMUCOSAL | Status: DC | PRN
Start: 2023-06-10 — End: 2023-06-11

## 2023-06-10 MED ORDER — ALBUMIN HUMAN 5 % IV SOLN
INTRAVENOUS | Status: AC
  Filled 2023-06-10: qty 250

## 2023-06-10 MED ORDER — PROPOFOL 500 MG/50ML IV EMUL
INTRAVENOUS | Status: DC | PRN
  Administered 2023-06-10: 50 ug/kg/min via INTRAVENOUS

## 2023-06-10 MED ORDER — MIDAZOLAM HCL 5 MG/5ML IJ SOLN
INTRAMUSCULAR | Status: DC | PRN
  Administered 2023-06-10: 2 mg via INTRAVENOUS

## 2023-06-10 MED ORDER — TAMSULOSIN HCL 0.4 MG PO CAPS
0.4000 mg | ORAL_CAPSULE | Freq: Every day | ORAL | Status: DC
  Administered 2023-06-10 – 2023-06-11 (×2): 0.4 mg via ORAL
  Filled 2023-06-10 (×2): qty 1

## 2023-06-10 MED ORDER — DIPHENHYDRAMINE HCL 12.5 MG/5ML PO ELIX: 12.5000 mg | ORAL_SOLUTION | ORAL | Status: DC | PRN

## 2023-06-10 MED ORDER — OXYCODONE HCL 5 MG/5ML PO SOLN: 5.0000 mg | Freq: Once | ORAL | Status: AC | PRN

## 2023-06-10 MED ORDER — OXYCODONE HCL 5 MG PO TABS
ORAL_TABLET | ORAL | Status: AC
  Filled 2023-06-10: qty 1

## 2023-06-10 MED ORDER — METOCLOPRAMIDE HCL 5 MG PO TABS: 5.0000 mg | ORAL_TABLET | Freq: Three times a day (TID) | ORAL | Status: DC | PRN

## 2023-06-10 MED ORDER — TRANEXAMIC ACID-NACL 1000-0.7 MG/100ML-% IV SOLN
1000.0000 mg | Freq: Once | INTRAVENOUS | Status: AC
  Administered 2023-06-10: 1000 mg via INTRAVENOUS
  Filled 2023-06-10: qty 100

## 2023-06-10 MED ORDER — METHOCARBAMOL 500 MG PO TABS
500.0000 mg | ORAL_TABLET | Freq: Four times a day (QID) | ORAL | Status: DC | PRN
  Administered 2023-06-10 (×2): 500 mg via ORAL
  Filled 2023-06-10 (×3): qty 1

## 2023-06-10 MED ORDER — 0.9 % SODIUM CHLORIDE (POUR BTL) OPTIME
TOPICAL | Status: DC | PRN
  Administered 2023-06-10: 500 mL

## 2023-06-10 MED ORDER — HYDROMORPHONE HCL 1 MG/ML IJ SOLN
0.5000 mg | INTRAMUSCULAR | Status: DC | PRN
Start: 2023-06-10 — End: 2023-06-11
  Administered 2023-06-10: 0.5 mg via INTRAVENOUS
  Filled 2023-06-10: qty 1

## 2023-06-10 MED ORDER — CEFAZOLIN SODIUM-DEXTROSE 2-4 GM/100ML-% IV SOLN
2.0000 g | Freq: Four times a day (QID) | INTRAVENOUS | Status: AC
  Administered 2023-06-10 (×2): 2 g via INTRAVENOUS
  Filled 2023-06-10 (×2): qty 100

## 2023-06-10 MED ORDER — DEXAMETHASONE SODIUM PHOSPHATE 10 MG/ML IJ SOLN
INTRAMUSCULAR | Status: DC | PRN
  Administered 2023-06-10: 10 mg via INTRAVENOUS

## 2023-06-10 MED ORDER — DEXAMETHASONE SODIUM PHOSPHATE 10 MG/ML IJ SOLN: 8.0000 mg | Freq: Once | INTRAMUSCULAR | Status: DC

## 2023-06-10 MED ORDER — CHLORHEXIDINE GLUCONATE 0.12 % MT SOLN
15.0000 mL | Freq: Once | OROMUCOSAL | Status: AC
  Administered 2023-06-10: 15 mL via OROMUCOSAL

## 2023-06-10 MED ORDER — ALBUTEROL SULFATE HFA 108 (90 BASE) MCG/ACT IN AERS: 2.0000 | INHALATION_SPRAY | RESPIRATORY_TRACT | Status: DC | PRN

## 2023-06-10 MED ORDER — PHENYLEPHRINE HCL-NACL 20-0.9 MG/250ML-% IV SOLN
INTRAVENOUS | Status: DC | PRN
  Administered 2023-06-10: 50 ug/min via INTRAVENOUS

## 2023-06-10 MED ORDER — ORAL CARE MOUTH RINSE: 15.0000 mL | Freq: Once | OROMUCOSAL | Status: AC

## 2023-06-10 MED ORDER — OXYCODONE HCL 5 MG PO TABS
5.0000 mg | ORAL_TABLET | ORAL | Status: DC | PRN
Start: 2023-06-10 — End: 2023-06-11
  Administered 2023-06-10 – 2023-06-11 (×2): 10 mg via ORAL
  Filled 2023-06-10 (×2): qty 2

## 2023-06-10 MED ORDER — METOCLOPRAMIDE HCL 5 MG/ML IJ SOLN: 5.0000 mg | Freq: Three times a day (TID) | INTRAMUSCULAR | Status: DC | PRN

## 2023-06-10 MED ORDER — DEXAMETHASONE SODIUM PHOSPHATE 10 MG/ML IJ SOLN
10.0000 mg | Freq: Once | INTRAMUSCULAR | Status: AC
  Administered 2023-06-11: 10 mg via INTRAVENOUS
  Filled 2023-06-10: qty 1

## 2023-06-10 MED ORDER — METFORMIN HCL ER 500 MG PO TB24
1000.0000 mg | ORAL_TABLET | Freq: Two times a day (BID) | ORAL | Status: DC
  Administered 2023-06-11: 1000 mg via ORAL
  Filled 2023-06-10: qty 2

## 2023-06-10 MED ORDER — ONDANSETRON HCL 4 MG/2ML IJ SOLN
INTRAMUSCULAR | Status: AC
Start: 2023-06-10 — End: ?
  Filled 2023-06-10: qty 2

## 2023-06-10 MED ORDER — PHENOL 1.4 % MT LIQD: 1.0000 | OROMUCOSAL | Status: DC | PRN

## 2023-06-10 MED ORDER — KETOROLAC TROMETHAMINE 30 MG/ML IJ SOLN
INTRAMUSCULAR | Status: AC
Start: 2023-06-10 — End: ?
  Filled 2023-06-10: qty 1

## 2023-06-10 MED ORDER — ONDANSETRON HCL 4 MG/2ML IJ SOLN: 4.0000 mg | Freq: Four times a day (QID) | INTRAMUSCULAR | Status: DC | PRN

## 2023-06-10 MED ORDER — DEXAMETHASONE SODIUM PHOSPHATE 10 MG/ML IJ SOLN
INTRAMUSCULAR | Status: AC
  Filled 2023-06-10: qty 1

## 2023-06-10 MED ORDER — STERILE WATER FOR IRRIGATION IR SOLN
Status: DC | PRN
  Administered 2023-06-10: 1000 mL

## 2023-06-10 MED ORDER — TRANEXAMIC ACID-NACL 1000-0.7 MG/100ML-% IV SOLN
1000.0000 mg | INTRAVENOUS | Status: AC
  Administered 2023-06-10: 1000 mg via INTRAVENOUS
  Filled 2023-06-10: qty 100

## 2023-06-10 MED ORDER — ACETAMINOPHEN 500 MG PO TABS
1000.0000 mg | ORAL_TABLET | Freq: Four times a day (QID) | ORAL | Status: DC
  Administered 2023-06-10 – 2023-06-11 (×4): 1000 mg via ORAL
  Filled 2023-06-10 (×4): qty 2

## 2023-06-10 MED ORDER — ONDANSETRON HCL 4 MG/2ML IJ SOLN: 4.0000 mg | Freq: Once | INTRAMUSCULAR | Status: DC | PRN

## 2023-06-10 MED ORDER — SODIUM CHLORIDE (PF) 0.9 % IJ SOLN
INTRAMUSCULAR | Status: DC | PRN
  Administered 2023-06-10: 30 mL via INTRAVENOUS

## 2023-06-10 MED ORDER — ATORVASTATIN CALCIUM 40 MG PO TABS
40.0000 mg | ORAL_TABLET | Freq: Every day | ORAL | Status: DC
  Administered 2023-06-10 – 2023-06-11 (×2): 40 mg via ORAL
  Filled 2023-06-10 (×2): qty 1

## 2023-06-10 MED ORDER — INSULIN ASPART 100 UNIT/ML IJ SOLN
0.0000 [IU] | INTRAMUSCULAR | Status: AC | PRN
  Administered 2023-06-10: 4 [IU] via SUBCUTANEOUS
  Administered 2023-06-10: 2 [IU] via SUBCUTANEOUS

## 2023-06-10 MED ORDER — INSULIN ASPART 100 UNIT/ML IJ SOLN
0.0000 [IU] | Freq: Three times a day (TID) | INTRAMUSCULAR | Status: DC
Start: 2023-06-10 — End: 2023-06-11
  Administered 2023-06-10: 5 [IU] via SUBCUTANEOUS
  Administered 2023-06-11: 8 [IU] via SUBCUTANEOUS
  Administered 2023-06-11: 3 [IU] via SUBCUTANEOUS

## 2023-06-10 MED ORDER — PROPOFOL 1000 MG/100ML IV EMUL
INTRAVENOUS | Status: AC
  Filled 2023-06-10: qty 100

## 2023-06-10 MED ORDER — CLONAZEPAM 0.5 MG PO TABS
0.5000 mg | ORAL_TABLET | Freq: Two times a day (BID) | ORAL | Status: DC
  Administered 2023-06-10 – 2023-06-11 (×2): 0.5 mg via ORAL
  Filled 2023-06-10 (×2): qty 1

## 2023-06-10 MED ORDER — LOSARTAN POTASSIUM 50 MG PO TABS
50.0000 mg | ORAL_TABLET | Freq: Every day | ORAL | Status: DC
  Administered 2023-06-11: 50 mg via ORAL
  Filled 2023-06-10: qty 1

## 2023-06-10 MED ORDER — BUPIVACAINE IN DEXTROSE 0.75-8.25 % IT SOLN
INTRATHECAL | Status: DC | PRN
  Administered 2023-06-10: 1.8 mL via INTRATHECAL

## 2023-06-10 MED ORDER — HYDROMORPHONE HCL 1 MG/ML IJ SOLN
0.2500 mg | INTRAMUSCULAR | Status: DC | PRN
Start: 2023-06-10 — End: 2023-06-10

## 2023-06-10 MED ORDER — OXYCODONE HCL 5 MG PO TABS
10.0000 mg | ORAL_TABLET | ORAL | Status: DC | PRN
  Filled 2023-06-10: qty 2

## 2023-06-10 MED ORDER — FENTANYL CITRATE (PF) 100 MCG/2ML IJ SOLN
INTRAMUSCULAR | Status: DC | PRN
  Administered 2023-06-10: 100 ug via INTRAVENOUS

## 2023-06-10 MED ORDER — SODIUM CHLORIDE 0.9% FLUSH
10.0000 mL | Freq: Two times a day (BID) | INTRAVENOUS | Status: DC
Start: 2023-06-10 — End: 2023-06-11
  Administered 2023-06-10 – 2023-06-11 (×2): 10 mL via INTRAVENOUS

## 2023-06-10 MED ORDER — RIVAROXABAN 10 MG PO TABS
10.0000 mg | ORAL_TABLET | Freq: Every day | ORAL | Status: DC
  Administered 2023-06-11: 10 mg via ORAL
  Filled 2023-06-10: qty 1

## 2023-06-10 MED ORDER — FENTANYL CITRATE (PF) 100 MCG/2ML IJ SOLN
INTRAMUSCULAR | Status: AC
  Filled 2023-06-10: qty 2

## 2023-06-10 SURGICAL SUPPLY — 43 items
ADH SKN CLS APL DERMABOND .7 (GAUZE/BANDAGES/DRESSINGS) ×1
BAG COUNTER SPONGE SURGICOUNT (BAG) IMPLANT
BAG SPEC THK2 15X12 ZIP CLS (MISCELLANEOUS)
BAG SPNG CNTER NS LX DISP (BAG)
BAG ZIPLOCK 12X15 (MISCELLANEOUS) IMPLANT
BLADE SAG 18X100X1.27 (BLADE) ×1 IMPLANT
COVER PERINEAL POST (MISCELLANEOUS) ×1 IMPLANT
COVER SURGICAL LIGHT HANDLE (MISCELLANEOUS) ×1 IMPLANT
CUP ACET PINNACLE SECTR 56MM (Hips) IMPLANT
DERMABOND ADVANCED .7 DNX12 (GAUZE/BANDAGES/DRESSINGS) ×1 IMPLANT
DRAPE FOOT SWITCH (DRAPES) ×1 IMPLANT
DRAPE STERI IOBAN 125X83 (DRAPES) ×1 IMPLANT
DRAPE U-SHAPE 47X51 STRL (DRAPES) ×2 IMPLANT
DRESSING AQUACEL AG SP 3.5X10 (GAUZE/BANDAGES/DRESSINGS) ×1 IMPLANT
DRSG AQUACEL AG ADV 3.5X10 (GAUZE/BANDAGES/DRESSINGS) IMPLANT
DRSG AQUACEL AG SP 3.5X10 (GAUZE/BANDAGES/DRESSINGS) ×1
DURAPREP 26ML APPLICATOR (WOUND CARE) ×1 IMPLANT
ELECT REM PT RETURN 15FT ADLT (MISCELLANEOUS) ×1 IMPLANT
GLOVE BIO SURGEON STRL SZ 6 (GLOVE) ×1 IMPLANT
GLOVE BIOGEL PI IND STRL 6.5 (GLOVE) ×1 IMPLANT
GLOVE BIOGEL PI IND STRL 7.5 (GLOVE) ×1 IMPLANT
GLOVE ORTHO TXT STRL SZ7.5 (GLOVE) ×2 IMPLANT
GOWN STRL REUS W/ TWL LRG LVL3 (GOWN DISPOSABLE) ×2 IMPLANT
GOWN STRL REUS W/TWL LRG LVL3 (GOWN DISPOSABLE) ×2
HEAD CERAMIC 36 PLUS5 (Hips) IMPLANT
HOLDER FOLEY CATH W/STRAP (MISCELLANEOUS) ×1 IMPLANT
KIT TURNOVER KIT A (KITS) IMPLANT
NDL SAFETY ECLIPSE 18X1.5 (NEEDLE) IMPLANT
PACK ANTERIOR HIP CUSTOM (KITS) ×1 IMPLANT
PINNACLE ALTRX PLUS 4 N 36X56 (Hips) IMPLANT
PINNACLE SECTOR CUP 56MM (Hips) ×1 IMPLANT
SCREW 6.5MMX30MM (Screw) IMPLANT
STEM FEMORAL SZ5 HIGH ACTIS (Stem) IMPLANT
SUT MNCRL AB 4-0 PS2 18 (SUTURE) ×1 IMPLANT
SUT STRATAFIX 0 PDS 27 VIOLET (SUTURE) ×1
SUT VIC AB 1 CT1 36 (SUTURE) ×3 IMPLANT
SUT VIC AB 2-0 CT1 27 (SUTURE) ×2
SUT VIC AB 2-0 CT1 TAPERPNT 27 (SUTURE) ×2 IMPLANT
SUTURE STRATFX 0 PDS 27 VIOLET (SUTURE) ×1 IMPLANT
SYR 3ML LL SCALE MARK (SYRINGE) IMPLANT
TRAY FOLEY MTR SLVR 16FR STAT (SET/KITS/TRAYS/PACK) IMPLANT
TUBE SUCTION HIGH CAP CLEAR NV (SUCTIONS) ×1 IMPLANT
WATER STERILE IRR 1000ML POUR (IV SOLUTION) ×1 IMPLANT

## 2023-06-10 NOTE — Op Note (Signed)
NAME:  Clayton [name]                ACCOUNT NO.: [id number]      MEDICAL RECORD NO.: [id number]      FACILITY:  [facility]      PHYSICIAN:  Shelda Pal  DATE OF BIRTH:  [birth year]     DATE OF PROCEDURE:  06/10/2023                                 OPERATIVE REPORT         PREOPERATIVE DIAGNOSIS: Left  hip osteoarthritis.      POSTOPERATIVE DIAGNOSIS:  Left hip osteoarthritis.      PROCEDURE:  Left total hip replacement through an anterior approach   utilizing DePuy THR system, component size 56 mm pinnacle cup, a size 36+4 neutral   Altrex liner, a size 5 Hi Actis stem with a 36+5 delta ceramic   ball.      SURGEON:  Madlyn Frankel. Charlann Boxer, M.D.      ASSISTANT:  Rosalene Billings, PA-C     ANESTHESIA:  Spinal.      SPECIMENS:  None.      COMPLICATIONS:  None.      BLOOD LOSS:  500 cc     DRAINS:  None.      INDICATION OF THE PROCEDURE:  Clayton [name] is a 67 y.o. male who had   presented to office for evaluation of left hip pain.  Radiographs revealed   progressive degenerative changes with bone-on-bone   articulation of the  hip joint, including subchondral cystic changes and osteophytes.  The patient had painful limited range of   motion significantly affecting their overall quality of life and function.  The patient was failing to    respond to conservative measures including medications and/or injections and activity modification and at this point was ready   to proceed with more definitive measures.  Consent was obtained for   benefit of pain relief.  Specific risks of infection, DVT, component   failure, dislocation, neurovascular injury, and need for revision surgery were reviewed in the office.  History of right THR from July 2024 doing well     PROCEDURE IN DETAIL:  The patient was brought to operative theater.   Once adequate anesthesia, preoperative antibiotics, 2 gm of Ancef, 1 gm of Tranexamic Acid, and 10 mg of Decadron were administered, the  patient was positioned supine on the [employer] table.  Once the patient was safely positioned with adequate padding of boney prominences we predraped out the hip, and used fluoroscopy to confirm orientation of the pelvis.      The left hip was then prepped and draped from proximal iliac crest to   mid thigh with a shower curtain technique.      Time-out was performed identifying the patient, planned procedure, and the appropriate extremity.     An incision was then made 2 cm lateral to the   anterior superior iliac spine extending over the orientation of the   tensor fascia lata muscle and sharp dissection was carried down to the   fascia of the muscle.      The fascia was then incised.  The muscle belly was identified and swept   laterally and retractor placed along the superior neck.  Following   cauterization of the circumflex vessels and removing some pericapsular   fat, a  second cobra retractor was placed on the inferior neck.  A T-capsulotomy was made along the line of the   superior neck to the trochanteric fossa, then extended proximally and   distally.  Tag sutures were placed and the retractors were then placed   intracapsular.  We then identified the trochanteric fossa and   orientation of my neck cut and then made a neck osteotomy with the femur on traction.  The femoral   head was removed without difficulty or complication.  Traction was let   off and retractors were placed posterior and anterior around the   acetabulum.      The labrum and foveal tissue were debrided.  I began reaming with a 52 mm   reamer and reamed up to 55 mm reamer with good bony bed preparation and a 56 mm  cup was chosen.  The final 56 mm Pinnacle cup was then impacted under fluoroscopy to confirm the depth of penetration and orientation with respect to   Abduction and forward flexion.  A screw was placed into the ilium followed by the hole eliminator.  The final   36+4 neutral Altrex liner was  impacted with good visualized rim fit.  The cup was positioned anatomically within the acetabular portion of the pelvis.      At this point, the femur was rolled to 100 degrees.  Further capsule was   released off the inferior aspect of the femoral neck.  I then   released the superior capsule proximally.  With the leg in a neutral position the hook was placed laterally   along the femur under the vastus lateralis origin and elevated manually and then held in position using the hook attachment on the bed.  The leg was then extended and adducted with the leg rolled to 100   degrees of external rotation.  Retractors were placed along the medial calcar and posteriorly over the greater trochanter.  Once the proximal femur was fully   exposed, I used a box osteotome to set orientation.  I then began   broaching with the starting chili pepper broach and passed this by hand and then broached up to 5.  With the 5 broach in place I chose a high offset neck and did several trial reductions.  The offset was appropriate, leg lengths   appeared to be equal best matched with the +5 head ball trial confirmed radiographically.   Given these findings, I went ahead and dislocated the hip, repositioned all   retractors and positioned the right hip in the extended and abducted position.  The final 5 Hi Actis stem was   chosen and it was impacted down to the level of neck cut.  Based on this   and the trial reductions, a final 36+5 delta ceramic ball was chosen and   impacted onto a clean and dry trunnion, and the hip was reduced.  The   hip had been irrigated throughout the case again at this point.  I did   reapproximate the superior capsular leaflet to the anterior leaflet   using #1 Vicryl.  The fascia of the   tensor fascia lata muscle was then reapproximated using #1 Vicryl and #0 Stratafix sutures.  The   remaining wound was closed with 2-0 Vicryl and running 4-0 Monocryl.   The hip was cleaned, dried, and  dressed sterilely using Dermabond and   Aquacel dressing.  The patient was then brought   to recovery room in stable condition  tolerating the procedure well.    Rosalene Billings, PA-C was present for the entirety of the case involved from   preoperative positioning, perioperative retractor management, general   facilitation of the case, as well as primary wound closure as assistant.            Madlyn Frankel Charlann Boxer, M.D.        06/10/2023 8:38 AM

## 2023-06-10 NOTE — Evaluation (Signed)
Physical Therapy Evaluation Patient Details Name: Clayton [name] MRN: [id number] DOB: 12/17/1955 Today's Date: 06/10/2023  History of Present Illness  67 yo male s/p L THA-DA 06/10/23. Hx of R THA DA 02/2023, DVT, DM, asthma  Clinical Impression  On eval POD 0, pt required Min A for mobility. He walked ~60 feet with a RW. Pain rated 8/10 during session. Will plan to follow and progress activity as tolerated. Pt is hopeful for to d/c before noon on tomorrow. Plan is for HEP.        If plan is discharge home, recommend the following: A little help with bathing/dressing/bathroom;Assistance with cooking/housework;Assist for transportation;Help with stairs or ramp for entrance;A little help with walking and/or transfers   Can travel by private vehicle        Equipment Recommendations None recommended by PT  Recommendations for Other Services       Functional Status Assessment Patient has had a recent decline in their functional status and demonstrates the ability to make significant improvements in function in a reasonable and predictable amount of time.     Precautions / Restrictions Precautions Precautions: Fall Restrictions Weight Bearing Restrictions: No Other Position/Activity Restrictions: WBAT      Mobility  Bed Mobility Overal bed mobility: Needs Assistance Bed Mobility: Supine to Sit, Sit to Supine     Supine to sit: Min assist, HOB elevated, Used rails Sit to supine: Min assist, HOB elevated, Used rails   General bed mobility comments: Assist for L LE. Increased time.    Transfers Overall transfer level: Needs assistance Equipment used: Rolling walker (2 wheels) Transfers: Sit to/from Stand Sit to Stand: Min assist, From elevated surface           General transfer comment: Cues for safety, technique, hand placement. Increased time.    Ambulation/Gait Ambulation/Gait assistance: Min assist Gait Distance (Feet): 60 Feet Assistive device: Rolling walker (2  wheels) Gait Pattern/deviations: Step-through pattern, Decreased stride length, Trunk flexed       General Gait Details: Cues for safety, sequencing. Slow but steady gait. Pt denied dizziness.  Stairs            Wheelchair Mobility     Tilt Bed    Modified Rankin (Stroke Patients Only)       Balance Overall balance assessment: Needs assistance         Standing balance support: Reliant on assistive device for balance, Bilateral upper extremity supported, During functional activity                                 Pertinent Vitals/Pain Pain Assessment Pain Assessment: 0-10 Pain Score: 8  Pain Location: L hip/thigh Pain Intervention(s): Limited activity within patient's tolerance, Monitored during session, Repositioned    Home Living Family/patient expects to be discharged to:: Private residence Living Arrangements: Spouse/significant other;Children;Parent Available Help at Discharge: Family Type of Home: House Home Access: Stairs to enter Entrance Stairs-Rails: Left Entrance Stairs-Number of Steps: 14 Alternate Level Stairs-Number of Steps: 3 steps then chair lift Home Layout: Two level;Bed/bath upstairs Home Equipment: Agricultural consultant (2 wheels)      Prior Function Prior Level of Function : Independent/Modified Independent                     Extremity/Trunk Assessment   Upper Extremity Assessment Upper Extremity Assessment: Overall WFL for tasks assessed    Lower Extremity Assessment Lower Extremity Assessment: Generalized weakness  Cervical / Trunk Assessment Cervical / Trunk Assessment: Normal  Communication   Communication Communication: No apparent difficulties  Cognition Arousal: Alert Behavior During Therapy: WFL for tasks assessed/performed Overall Cognitive Status: Within Functional Limits for tasks assessed                                          General Comments      Exercises      Assessment/Plan    PT Assessment Patient needs continued PT services  PT Problem List Decreased strength;Decreased range of motion;Decreased activity tolerance;Decreased balance;Decreased mobility;Decreased knowledge of use of DME;Pain       PT Treatment Interventions DME instruction;Gait training;Stair training;Functional mobility training;Therapeutic activities;Therapeutic exercise;Patient/family education;Balance training    PT Goals (Current goals can be found in the Care Plan section)  Acute Rehab PT Goals Patient Stated Goal: regain plof/independence PT Goal Formulation: With patient Time For Goal Achievement: 06/24/23 Potential to Achieve Goals: Good    Frequency 7X/week     Co-evaluation               AM-PAC PT "6 Clicks" Mobility  Outcome Measure Help needed turning from your back to your side while in a flat bed without using bedrails?: A Little Help needed moving from lying on your back to sitting on the side of a flat bed without using bedrails?: A Little Help needed moving to and from a bed to a chair (including a wheelchair)?: A Little Help needed standing up from a chair using your arms (e.g., wheelchair or bedside chair)?: A Little Help needed to walk in hospital room?: A Little Help needed climbing 3-5 steps with a railing? : A Little 6 Click Score: 18    End of Session Equipment Utilized During Treatment: Gait belt Activity Tolerance: Patient tolerated treatment well Patient left: in bed;with call bell/phone within reach;with bed alarm set   PT Visit Diagnosis: Pain;Other abnormalities of gait and mobility (R26.89) Pain - Right/Left: Left Pain - part of body: Hip    Time: 1610-9604 PT Time Calculation (min) (ACUTE ONLY): 18 min   Charges:   PT Evaluation $PT Eval Low Complexity: 1 Low   PT General Charges $$ ACUTE PT VISIT: 1 Visit            Faye Ramsay, PT Acute Rehabilitation  Office: [phone]

## 2023-06-10 NOTE — Progress Notes (Signed)
   06/10/23 2354  BiPAP/CPAP/SIPAP  $ Non-Invasive Home Ventilator  Initial  BiPAP/CPAP/SIPAP Pt Type Adult  BiPAP/CPAP/SIPAP DREAMSTATIOND  Mask Type Full face mask  Respiratory Rate 18 breaths/min  FiO2 (%) 21 %  Patient Home Equipment No  Auto Titrate Yes (10-20)

## 2023-06-10 NOTE — Anesthesia Postprocedure Evaluation (Signed)
Anesthesia Post Note  Patient: Abdulai Blaylock  Procedure(s) Performed: TOTAL HIP ARTHROPLASTY ANTERIOR APPROACH (Left: Hip)     Patient location during evaluation: PACU Anesthesia Type: MAC and Spinal Level of consciousness: awake and alert and oriented Pain management: pain level controlled Vital Signs Assessment: post-procedure vital signs reviewed and stable Respiratory status: spontaneous breathing, nonlabored ventilation and respiratory function stable Cardiovascular status: blood pressure returned to baseline and stable Postop Assessment: no headache, no backache, spinal receding and patient able to bend at knees Anesthetic complications: no   No notable events documented.  Last Vitals:  Vitals:   06/10/23 1115 06/10/23 1200  BP: (!) 141/69   Pulse: 62 75  Resp: 20   Temp: 36.6 C   SpO2: 94% 92%    Last Pain:  Vitals:   06/10/23 1200  TempSrc:   PainSc: 0-No pain    LLE Motor Response: Purposeful movement (06/10/23 1200) LLE Sensation: Numbness (06/10/23 1200) RLE Motor Response: Purposeful movement (06/10/23 1200) RLE Sensation: Numbness (06/10/23 1200) L Sensory Level: S1-Sole of foot, small toes (06/10/23 1200) R Sensory Level: S1-Sole of foot, small toes (06/10/23 1200)  Lannie Fields

## 2023-06-10 NOTE — Discharge Instructions (Addendum)

## 2023-06-10 NOTE — Care Plan (Signed)
Ortho Bundle Case Management Note  Patient Details  Name: Sylvestre Rathgeber MRN: [id number] Date of Birth: 1955/10/08                  L THA on 06/10/23.  DCP: Home with wife.  DME: No needs. Has RW.  PT: HEP   DME Arranged:  N/A DME Agency:       Additional Comments: Please contact me with any questions of if this plan should need to change.    Despina Pole, CCM Case Manager, [facility]  [phone] 06/10/2023, 9:59 AM

## 2023-06-10 NOTE — Anesthesia Procedure Notes (Signed)
Spinal  Patient location during procedure: OR Start time: 06/10/2023 8:40 AM End time: 06/10/2023 8:44 AM Reason for block: surgical anesthesia Staffing Performed: resident/CRNA  Resident/CRNA: Florene Route, CRNA Performed by: Florene Route, CRNA Authorized by: Lannie Fields, DO   Preanesthetic Checklist Completed: patient identified, IV checked, site marked, risks and benefits discussed, surgical consent, monitors and equipment checked, pre-op evaluation and timeout performed Spinal Block Patient position: sitting Prep: DuraPrep Patient monitoring: continuous pulse ox and blood pressure Approach: midline Location: L3-4 Injection technique: single-shot Needle Needle type: Pencan  Needle gauge: 24 G Needle length: 10 cm Assessment Sensory level: T6 Events: CSF return Additional Notes Kit expiration date 01/02/2025 and lot # [id number] Clear free flow CSF, negative heme, negative paresthesia Tolerated well and returned to supine position

## 2023-06-10 NOTE — Interval H&P Note (Signed)
History and Physical Interval Note:  06/10/2023 7:19 AM  Clayton [name]  has presented today for surgery, with the diagnosis of left hip osteoarthritis.  The various methods of treatment have been discussed with the patient and family. After consideration of risks, benefits and other options for treatment, the patient has consented to  Procedure(s): TOTAL HIP ARTHROPLASTY ANTERIOR APPROACH (Left) as a surgical intervention.  The patient's history has been reviewed, patient examined, no change in status, stable for surgery.  I have reviewed the patient's chart and labs.  Questions were answered to the patient's satisfaction.     Shelda Pal

## 2023-06-10 NOTE — Transfer of Care (Signed)
Immediate Anesthesia Transfer of Care Note  Patient: Clayton [name]  Procedure(s) Performed: TOTAL HIP ARTHROPLASTY ANTERIOR APPROACH (Left: Hip)  Patient Location: PACU  Anesthesia Type:Spinal  Level of Consciousness: awake, alert , and oriented  Airway & Oxygen Therapy: Patient Spontanous Breathing and Patient connected to face mask oxygen  Post-op Assessment: Report given to RN and Post -op Vital signs reviewed and stable  Post vital signs: Reviewed and stable  Last Vitals:  Vitals Value Taken Time  BP    Temp    Pulse 68 06/10/23 1015  Resp 15 06/10/23 1015  SpO2 100 % 06/10/23 1015  Vitals shown include unfiled device data.  Last Pain:  Vitals:   06/10/23 0657  TempSrc: Oral  PainSc:       Patients Stated Pain Goal: 4 (06/10/23 0656)  Complications: No notable events documented.

## 2023-06-10 NOTE — Anesthesia Procedure Notes (Signed)
Date/Time: 06/10/2023 8:38 AM  Performed by: Florene Route, CRNAOxygen Delivery Method: Simple face mask

## 2023-06-11 ENCOUNTER — Encounter (HOSPITAL_COMMUNITY): Payer: Self-pay | Admitting: Orthopedic Surgery

## 2023-06-11 DIAGNOSIS — M1612 Unilateral primary osteoarthritis, left hip: Secondary | ICD-10-CM | POA: Diagnosis not present

## 2023-06-11 LAB — BASIC METABOLIC PANEL
Anion gap: 9 (ref 5–15)
BUN: 22 mg/dL (ref 8–23)
CO2: 22 mmol/L (ref 22–32)
Calcium: 8.8 mg/dL — ABNORMAL LOW (ref 8.9–10.3)
Chloride: 105 mmol/L (ref 98–111)
Creatinine, Ser: 0.85 mg/dL (ref 0.61–1.24)
GFR, Estimated: >60 mL/min (ref >60)
Glucose, Bld: 224 mg/dL — ABNORMAL HIGH (ref 70–99)
Potassium: 3.9 mmol/L (ref 3.5–5.1)
Sodium: 136 mmol/L (ref 135–145)

## 2023-06-11 LAB — CBC
HCT: 35.9 % — ABNORMAL LOW (ref 39.0–52.0)
Hemoglobin: 11.5 g/dL — ABNORMAL LOW (ref 13.0–17.0)
MCH: 30.3 pg (ref 26.0–34.0)
MCHC: 32 g/dL (ref 30.0–36.0)
MCV: 94.5 fL (ref 80.0–100.0)
Platelets: 281 10*3/uL (ref 150–400)
RBC: 3.8 MIL/uL — ABNORMAL LOW (ref 4.22–5.81)
RDW: 13.2 % (ref 11.5–15.5)
WBC: 21.9 10*3/uL — ABNORMAL HIGH (ref 4.0–10.5)
nRBC: 0 % (ref 0.0–0.2)

## 2023-06-11 LAB — GLUCOSE, CAPILLARY
Glucose-Capillary: 176 mg/dL — ABNORMAL HIGH (ref 70–99)
Glucose-Capillary: 297 mg/dL — ABNORMAL HIGH (ref 70–99)

## 2023-06-11 MED ORDER — OXYCODONE HCL 5 MG PO TABS: 5.0000 mg | ORAL_TABLET | ORAL | 0 refills | Status: AC | PRN

## 2023-06-11 MED ORDER — SENNA 8.6 MG PO TABS: 2.0000 | ORAL_TABLET | Freq: Every day | ORAL | 0 refills | Status: AC

## 2023-06-11 MED ORDER — RIVAROXABAN 10 MG PO TABS: 10.0000 mg | ORAL_TABLET | Freq: Every day | ORAL | 0 refills | Status: AC

## 2023-06-11 MED ORDER — METHOCARBAMOL 500 MG PO TABS: 500.0000 mg | ORAL_TABLET | Freq: Four times a day (QID) | ORAL | 2 refills | Status: AC | PRN

## 2023-06-11 NOTE — TOC Transition Note (Signed)
Transition of Care [facility]) - CM/SW Discharge Note  Patient Details  Name: Thierry Dobosz MRN: [id number] Date of Birth: 01-23-1956  [facility] CM/SW Contact:  Ewing Schlein, LCSW Phone Number: 06/11/2023, 11:42 AM  Clinical Narrative: Patient is expected to discharge home after working with PT. CSW met with patient to confirm discharge plan. Patient will discharge home with a home exercise program (HEP). Patient has a rolling walker at home, so there are no DME needs at this time. TOC signing off.  Final next level of care: Home/Self Care Barriers to Discharge: No Barriers Identified  Patient Goals and CMS Choice Choice offered to / list presented to : NA  Discharge Plan and Services Additional resources added to the After Visit Summary for           DME Arranged: N/A DME Agency: NA  Social Determinants of Health (SDOH) Interventions SDOH Screenings   Food Insecurity: No Food Insecurity (06/10/2023)  Housing: Patient Declined (06/10/2023)  Transportation Needs: No Transportation Needs (06/10/2023)  Utilities: Not At Risk (06/10/2023)  Depression (PHQ2-9): High Risk (05/31/2021)  Tobacco Use: Low Risk  (06/10/2023)   Readmission Risk Interventions     No data to display

## 2023-06-11 NOTE — Plan of Care (Signed)
  Problem: Activity: Goal: Ability to avoid complications of mobility impairment will improve Outcome: Progressing Goal: Ability to tolerate increased activity will improve Outcome: Progressing   Problem: Pain Management: Goal: General experience of comfort will improve Outcome: Progressing   Problem: Safety: Goal: Ability to remain free from injury will improve Outcome: Progressing

## 2023-06-11 NOTE — Progress Notes (Signed)
Physical Therapy Treatment Patient Details Name: Clayton [name] MRN: [id number] DOB: 02/04/1956 Today's Date: 06/11/2023   History of Present Illness 67 yo male s/p L THA-DA 06/10/23. Hx of R THA DA 02/2023, DVT, DM, asthma    PT Comments  Pt is progressing well, meeting goals; pt states he is familiar with HEP from previous THA in July. Reviewed mobility/safety as below; mild dyspnea with exertions, baseline per pt;  pt feels ready to d/c with spouse assist as needed. RN aware.     If plan is discharge home, recommend the following: A little help with bathing/dressing/bathroom;Assistance with cooking/housework;Assist for transportation;Help with stairs or ramp for entrance;A little help with walking and/or transfers   Can travel by private vehicle        Equipment Recommendations  None recommended by PT    Recommendations for Other Services       Precautions / Restrictions Precautions Precautions: Fall Restrictions Weight Bearing Restrictions: No Other Position/Activity Restrictions: WBAT     Mobility  Bed Mobility Overal bed mobility: Needs Assistance Bed Mobility: Supine to Sit     Supine to sit: Supervision, HOB elevated     General bed mobility comments: for safety, incr time    Transfers Overall transfer level: Needs assistance Equipment used: Rolling walker (2 wheels) Transfers: Sit to/from Stand Sit to Stand: Contact guard assist, Supervision           General transfer comment: Cues for safety-(varied ht surfaces STS x3) technique, hand placement. Increased time.    Ambulation/Gait Ambulation/Gait assistance: Contact guard assist, Supervision Gait Distance (Feet): 100 Feet (x2) Assistive device: Rolling walker (2 wheels) Gait Pattern/deviations: Step-through pattern, Decreased stride length, Trunk flexed       General Gait Details: Cues for safety, sequencing. steady gait. Pt denied dizziness.   Stairs Stairs: Yes Stairs assistance: Contact  guard assist, Supervision Stair Management: One rail Left, With cane, Step to pattern Number of Stairs: 4 General stair comments: cues for sequence and technique, steady, no LOB; pt familiar with technique from previous THA   Wheelchair Mobility     Tilt Bed    Modified Rankin (Stroke Patients Only)       Balance                                            Cognition Arousal: Alert Behavior During Therapy: WFL for tasks assessed/performed Overall Cognitive Status: Within Functional Limits for tasks assessed                                          Exercises      General Comments        Pertinent Vitals/Pain Pain Assessment Pain Assessment: 0-10 Pain Score: 3  Pain Location: L hip/thigh Pain Descriptors / Indicators: Sore Pain Intervention(s): Limited activity within patient's tolerance, Monitored during session, Premedicated before session, Repositioned    Home Living                          Prior Function            PT Goals (current goals can now be found in the care plan section) Acute Rehab PT Goals Patient Stated Goal: regain plof/independence PT Goal Formulation: With patient Time For  Goal Achievement: 06/24/23 Potential to Achieve Goals: Good Progress towards PT goals: Progressing toward goals    Frequency    7X/week      PT Plan      Co-evaluation              AM-PAC PT "6 Clicks" Mobility   Outcome Measure  Help needed turning from your back to your side while in a flat bed without using bedrails?: None Help needed moving from lying on your back to sitting on the side of a flat bed without using bedrails?: None Help needed moving to and from a bed to a chair (including a wheelchair)?: A Little Help needed standing up from a chair using your arms (e.g., wheelchair or bedside chair)?: A Little Help needed to walk in hospital room?: A Little Help needed climbing 3-5 steps with a  railing? : A Little 6 Click Score: 20    End of Session Equipment Utilized During Treatment: Gait belt Activity Tolerance: Patient tolerated treatment well Patient left: with nursing/sitter in room;Other (comment) (chair, RN present) Nurse Communication: Mobility status PT Visit Diagnosis: Pain;Other abnormalities of gait and mobility (R26.89) Pain - Right/Left: Left Pain - part of body: Hip     Time: 4782-9562 PT Time Calculation (min) (ACUTE ONLY): 25 min  Charges:    $Gait Training: 23-37 mins PT General Charges $$ ACUTE PT VISIT: 1 Visit                     Clement Deneault, PT  Acute Rehab Dept [facility]) [phone]  06/11/2023    [facility] 06/11/2023, 11:23 AM

## 2023-06-11 NOTE — Progress Notes (Signed)
   Subjective: 1 Day Post-Op Procedure(s) (LRB): TOTAL HIP ARTHROPLASTY ANTERIOR APPROACH (Left) Patient reports pain as mild.   Patient seen in rounds with Dr. Charlann Boxer. Patient is resting in bed on exam this morning. No acute events overnight. Foley catheter removed. Patient ambulated 60 feet with PT yesterday. We will start therapy today.   Objective: Vital signs in last 24 hours: Temp:  [97.5 F (36.4 C)-98.1 F (36.7 C)] 97.7 F (36.5 C) (11/06 0634) Pulse Rate:  [62-98] 90 (11/06 0634) Resp:  [14-20] 17 (11/06 0634) BP: (99-175)/(65-107) 160/80 (11/06 0634) SpO2:  [89 %-100 %] 94 % (11/06 0634) FiO2 (%):  [21 %] 21 % (11/05 2354)  Intake/Output from previous day:  Intake/Output Summary (Last 24 hours) at 06/11/2023 0832 Last data filed at 06/11/2023 0600 Gross per 24 hour  Intake 1980 ml  Output 2975 ml  Net -995 ml     Intake/Output this shift: No intake/output data recorded.  Labs: Recent Labs    06/11/23 0327  HGB 11.5*   Recent Labs    06/11/23 0327  WBC 21.9*  RBC 3.80*  HCT 35.9*  PLT 281   Recent Labs    06/11/23 0327  NA 136  K 3.9  CL 105  CO2 22  BUN 22  CREATININE 0.85  GLUCOSE 224*  CALCIUM 8.8*   No results for input(s): "LABPT", "INR" in the last 72 hours.  Exam: General - Patient is Alert and Oriented Extremity - Neurologically intact Sensation intact distally Intact pulses distally Dorsiflexion/Plantar flexion intact Dressing - dressing C/D/I Motor Function - intact, moving foot and toes well on exam.   Past Medical History:  Diagnosis Date   Anxiety    Arthritis    Asthma    pulmonary allergies- no asthma per pt   Atrophic condition of skin    Cellulitis/abscess - trunk    COPD (chronic obstructive pulmonary disease) (HCC)    Cyst    shoulder   Depression    Diabetes mellitus without complication (HCC)    Dyspnea    Enlarged prostate    Hyperlipidemia    Hypertension    Hypertrophic condition of skin    Lipoma     Obesity    Pneumonia    hx child   Sleep apnea    no cpap   Stones in the urinary tract    Vitamin D deficiency     Assessment/Plan: 1 Day Post-Op Procedure(s) (LRB): TOTAL HIP ARTHROPLASTY ANTERIOR APPROACH (Left) Principal Problem:   S/P total left hip arthroplasty  Estimated body mass index is 36.33 kg/m as calculated from the following:   Height as of this encounter: 5\' 9"  (1.753 m).   Weight as of this encounter: 111.6 kg. Advance diet Up with therapy D/C IV fluids  DVT Prophylaxis - Aspirin Weight bearing as tolerated.  Hgb stable at 11.5 this AM.  Plan is to go Home after hospital stay. Plan for discharge today after meeting goals with therapy. Follow up in the office in 2 weeks.   Rosalene Billings, PA-C Orthopedic Surgery [phone] 06/11/2023, 8:32 AM

## 2023-06-11 NOTE — Progress Notes (Signed)
Discharge package printed and instructions given to pt. Pt verbalizes understanding. 

## 2023-06-22 NOTE — Discharge Summary (Signed)
Patient ID: Clayton [name] MRN: [id number] DOB/AGE: 65-Feb-1957 65 y.o.  Admit date: 06/10/2023 Discharge date: 06/11/2023  Admission Diagnoses:  Left hip osteoarthritis  Discharge Diagnoses:  Principal Problem:   S/P total left hip arthroplasty   Past Medical History:  Diagnosis Date   Anxiety    Arthritis    Asthma    pulmonary allergies- no asthma per pt   Atrophic condition of skin    Cellulitis/abscess - trunk    COPD (chronic obstructive pulmonary disease) (HCC)    Cyst    shoulder   Depression    Diabetes mellitus without complication (HCC)    Dyspnea    Enlarged prostate    Hyperlipidemia    Hypertension    Hypertrophic condition of skin    Lipoma    Obesity    Pneumonia    hx child   Sleep apnea    no cpap   Stones in the urinary tract    Vitamin D deficiency     Surgeries: Procedure(s): TOTAL HIP ARTHROPLASTY ANTERIOR APPROACH on 06/10/2023   Consultants:   Discharged Condition: Improved  Hospital Course: Clayton [name] is an 67 y.o. male who was admitted 06/10/2023 for operative treatment ofS/P total left hip arthroplasty. Patient has severe unremitting pain that affects sleep, daily activities, and work/hobbies. After pre-op clearance the patient was taken to the operating room on 06/10/2023 and underwent  Procedure(s): TOTAL HIP ARTHROPLASTY ANTERIOR APPROACH.    Patient was given perioperative antibiotics:  Anti-infectives (From admission, onward)    Start     Dose/Rate Route Frequency Ordered Stop   06/10/23 1500  ceFAZolin (ANCEF) IVPB 2g/100 mL premix        2 g 200 mL/hr over 30 Minutes Intravenous Every 6 hours 06/10/23 1418 06/10/23 2134   06/10/23 0630  ceFAZolin (ANCEF) IVPB 2g/100 mL premix        2 g 200 mL/hr over 30 Minutes Intravenous On call to O.R. 06/10/23 0865 06/10/23 0856        Patient was given sequential compression devices, early ambulation, and chemoprophylaxis to prevent DVT. Patient worked with PT and was meeting their  goals regarding safe ambulation and transfers.  Patient benefited maximally from hospital stay and there were no complications.    Recent vital signs: No data found.   Recent laboratory studies: No results for input(s): "WBC", "HGB", "HCT", "PLT", "NA", "K", "CL", "CO2", "BUN", "CREATININE", "GLUCOSE", "INR", "CALCIUM" in the last 72 hours.  Invalid input(s): "PT", "2"   Discharge Medications:   Allergies as of 06/11/2023   No Known Allergies      Medication List     STOP taking these medications    celecoxib 200 MG capsule Commonly known as: CELEBREX       TAKE these medications    Acetaminophen 500 MG capsule Take 1,000 mg by mouth every 6 (six) hours as needed for moderate pain (pain score 4-6). Take 2000 mg in the morning, may take a second 2000 mg dose during the day as needed for pain   albuterol 108 (90 Base) MCG/ACT inhaler Commonly known as: VENTOLIN HFA Inhale 2 puffs into the lungs every 4 (four) hours as needed for wheezing or shortness of breath.   atorvastatin 40 MG tablet Commonly known as: LIPITOR Take 40 mg by mouth daily.   calcium carbonate 500 MG chewable tablet Commonly known as: TUMS - dosed in mg elemental calcium Chew 2 tablets by mouth daily as needed for indigestion or heartburn.   clonazePAM  0.5 MG tablet Commonly known as: KLONOPIN Take 0.5 mg by mouth 2 (two) times daily.   diphenhydramine-acetaminophen 25-500 MG Tabs tablet Commonly known as: TYLENOL PM Take 2 tablets by mouth at bedtime as needed (sleep).   ipratropium-albuterol 0.5-2.5 (3) MG/3ML Soln Commonly known as: DUONEB Take 3 mLs by nebulization every 6 (six) hours as needed (shortness of breath).   losartan 50 MG tablet Commonly known as: COZAAR Take 50 mg by mouth daily.   metFORMIN 500 MG 24 hr tablet Commonly known as: GLUCOPHAGE-XR Take 1,000 mg by mouth 2 (two) times daily with a meal.   methocarbamol 500 MG tablet Commonly known as: ROBAXIN Take 1 tablet  (500 mg total) by mouth every 6 (six) hours as needed for muscle spasms. What changed: reasons to take this   multivitamin with minerals Tabs tablet Take 1 tablet by mouth daily.   NON FORMULARY Pt uses cpap   oxyCODONE 5 MG immediate release tablet Commonly known as: Oxy IR/ROXICODONE Take 1 tablet (5 mg total) by mouth every 4 (four) hours as needed for severe pain (pain score 7-10).   polyethylene glycol 17 g packet Commonly known as: MIRALAX / GLYCOLAX Take 17 g by mouth 2 (two) times daily. What changed:  when to take this reasons to take this   rivaroxaban 10 MG Tabs tablet Commonly known as: XARELTO Take 1 tablet (10 mg total) by mouth daily with breakfast for 14 days. Then take aspirin 81 mg twice daily for another month What changed: additional instructions   senna 8.6 MG Tabs tablet Commonly known as: SENOKOT Take 2 tablets (17.2 mg total) by mouth at bedtime for 14 days.   sertraline 100 MG tablet Commonly known as: ZOLOFT Take 200 mg by mouth daily.   sildenafil 20 MG tablet Commonly known as: REVATIO Take 20-100 mg by mouth daily as needed (ED).   tamsulosin 0.4 MG Caps capsule Commonly known as: FLOMAX Take 0.4 mg by mouth daily.               Discharge Care Instructions  (From admission, onward)           Start     Ordered   06/11/23 0000  Change dressing       Comments: Maintain surgical dressing until follow up in the clinic. If the edges start to pull up, may reinforce with tape. If the dressing is no longer working, may remove and cover with gauze and tape, but must keep the area dry and clean.  Call with any questions or concerns.   06/11/23 0835            Diagnostic Studies: DG HIP UNILAT WITH PELVIS 1V LEFT  Result Date: 06/10/2023 CLINICAL DATA:  Elective surgery. EXAM: DG HIP (WITH OR WITHOUT PELVIS) 1V*L* COMPARISON:  None Available. FINDINGS: Two fluoroscopic spot views of the pelvis and left hip obtained in the operating  room. Sequential images during hip arthroplasty. Fluoroscopy time 15 seconds. Dose : Not provided. IMPRESSION: Intraoperative fluoroscopy during left hip arthroplasty. Electronically Signed   By: Narda Rutherford M.D.   On: 06/10/2023 12:49   DG Pelvis Portable  Result Date: 06/10/2023 CLINICAL DATA:  Status post hip arthroplasty. EXAM: PORTABLE PELVIS 1-2 VIEWS COMPARISON:  None Available. FINDINGS: Left hip arthroplasty in expected alignment. No periprosthetic lucency or fracture. Recent postsurgical change includes air and edema in the soft tissues. IMPRESSION: Left hip arthroplasty without immediate postoperative complication. Electronically Signed   By: Ivette Loyal.D.  On: 06/10/2023 12:34   DG C-Arm 1-60 Min-No Report  Result Date: 06/10/2023 Fluoroscopy was utilized by the requesting physician.  No radiographic interpretation.   MR PROSTATE W WO CONTRAST  Result Date: 05/27/2023 CLINICAL DATA:  Elevated PSA level of 6.62 on 03/25/2023. Benign biopsy 04/11/2015. EXAM: MR PROSTATE WITHOUT AND WITH CONTRAST TECHNIQUE: Multiplanar multisequence MRI images were obtained of the pelvis centered about the prostate. Pre and post contrast images were obtained. CONTRAST:  10 cc Vueway COMPARISON:  None Available. FINDINGS: Prostate: Encapsulated nodularity in the transition zone compatible with benign prostatic hypertrophy. Region of interest # 1: PI-RADS category 3 lesion of the left anterior peripheral zone at the apex with focally reduced T2 signal (image 51, series 9) corresponding to reduced ADC map activity (image 18, series 6). This measures 0.23 cc (1.0 by 0.4 by 0.7 cm). Volume: 3D volumetric assessment: Prostate volume 52.3 cc (5.8 by 4.2 by 4.7 cm). Transcapsular spread: Absent Seminal vesicle involvement: Absent Neurovascular bundle involvement: Absent Pelvic adenopathy: Absent Bone metastasis: Absent Other findings: Metal artifact from right hip hardware noted. 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 Mild sigmoid colon diverticulosis. IMPRESSION: 1. Small PI-RADS category 3 lesion of the left anterior peripheral zone at the apex. Targeting data sent to [facility]. 2. Mild prostatomegaly and benign prostatic hypertrophy. 3. 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 4. Mild sigmoid colon diverticulosis. Electronically Signed   By: Gaylyn Rong M.D.   On: 05/27/2023 09:29   DG Eye Foreign Body  Result Date: 05/26/2023 CLINICAL DATA:  Metal working/exposure; clearance prior to MRI EXAM: ORBITS FOR FOREIGN BODY - 1 VIEW COMPARISON:  None Available. FINDINGS: There is no evidence of metallic foreign body within the orbits. No significant bone abnormality identified. IMPRESSION: No evidence of metallic foreign body within the orbits. Electronically Signed   By: Lorenza Cambridge M.D.   On: 05/26/2023 09:26    Disposition: Discharge disposition: 01-Home or Self Care       Discharge Instructions     Call MD / Call 911   Complete by: As directed    If you experience chest pain or shortness of breath, CALL 911 and be transported to the hospital emergency room.  If you develope a fever above 101 F, pus (white drainage) or increased drainage or redness at the wound, or calf pain, call your surgeon's office.   Change dressing   Complete by: As directed    Maintain surgical dressing until follow up in the clinic. If the edges start to pull up, may reinforce with tape. If the dressing is no longer working, may remove and cover with gauze and tape, but must keep the area dry and clean.  Call with any questions or concerns.   Constipation Prevention   Complete by: As directed    Drink plenty of fluids.  Prune juice may be helpful.  You may use a stool softener, such as Colace (over the counter) 100 mg twice a day.  Use MiraLax (over the counter) for constipation as needed.   Diet - low sodium heart healthy    Complete by: As directed    Increase activity slowly as tolerated   Complete by: As directed    Weight bearing as tolerated with assist device (walker, cane, etc) as directed, use it as long as suggested by your surgeon or therapist, typically at least 4-6 weeks.   Post-operative opioid taper instructions:  Complete by: As directed    POST-OPERATIVE OPIOID TAPER INSTRUCTIONS: It is important to wean off of your opioid medication as soon as possible. If you do not need pain medication after your surgery it is ok to stop day one. Opioids include: Codeine, Hydrocodone(Norco, Vicodin), Oxycodone(Percocet, oxycontin) and hydromorphone amongst others.  Long term and even short term use of opiods can cause: Increased pain response Dependence Constipation Depression Respiratory depression And more.  Withdrawal symptoms can include Flu like symptoms Nausea, vomiting And more Techniques to manage these symptoms Hydrate well Eat regular healthy meals Stay active Use relaxation techniques(deep breathing, meditating, yoga) Do Not substitute Alcohol to help with tapering If you have been on opioids for less than two weeks and do not have pain than it is ok to stop all together.  Plan to wean off of opioids This plan should start within one week post op of your joint replacement. Maintain the same interval or time between taking each dose and first decrease the dose.  Cut the total daily intake of opioids by one tablet each day Next start to increase the time between doses. The last dose that should be eliminated is the evening dose.      TED hose   Complete by: As directed    Use stockings (TED hose) for 2 weeks on both leg(s).  You may remove them at night for sleeping.        Follow-up Information     Durene Romans, MD. Go on 06/25/2023.   Specialty: Orthopedic Surgery Why: You are scheduled for first post op appt on Wednesday Nov 20 at 9:00am. Contact information: [address] [place] [address] [phone]                  Signed: Cassandria Anger 06/22/2023, 7:22 AM

## 2023-06-23 DIAGNOSIS — H903 Sensorineural hearing loss, bilateral: Secondary | ICD-10-CM | POA: Diagnosis not present

## 2023-06-23 DIAGNOSIS — H9313 Tinnitus, bilateral: Secondary | ICD-10-CM | POA: Diagnosis not present

## 2023-07-01 DIAGNOSIS — R972 Elevated prostate specific antigen [PSA]: Secondary | ICD-10-CM | POA: Diagnosis not present

## 2023-07-01 DIAGNOSIS — D075 Carcinoma in situ of prostate: Secondary | ICD-10-CM | POA: Diagnosis not present

## 2023-07-01 DIAGNOSIS — R350 Frequency of micturition: Secondary | ICD-10-CM | POA: Diagnosis not present

## 2023-07-01 DIAGNOSIS — C61 Malignant neoplasm of prostate: Secondary | ICD-10-CM | POA: Diagnosis not present

## 2023-07-10 DIAGNOSIS — C61 Malignant neoplasm of prostate: Secondary | ICD-10-CM | POA: Diagnosis not present

## 2023-07-10 DIAGNOSIS — N5201 Erectile dysfunction due to arterial insufficiency: Secondary | ICD-10-CM | POA: Diagnosis not present

## 2023-07-10 DIAGNOSIS — M5416 Radiculopathy, lumbar region: Secondary | ICD-10-CM | POA: Diagnosis not present

## 2023-07-10 DIAGNOSIS — N401 Enlarged prostate with lower urinary tract symptoms: Secondary | ICD-10-CM | POA: Diagnosis not present

## 2023-07-10 DIAGNOSIS — R350 Frequency of micturition: Secondary | ICD-10-CM | POA: Diagnosis not present

## 2023-07-25 DIAGNOSIS — Z5189 Encounter for other specified aftercare: Secondary | ICD-10-CM | POA: Diagnosis not present

## 2023-07-28 DIAGNOSIS — Z96642 Presence of left artificial hip joint: Secondary | ICD-10-CM | POA: Diagnosis not present

## 2023-07-28 DIAGNOSIS — M5030 Other cervical disc degeneration, unspecified cervical region: Secondary | ICD-10-CM | POA: Diagnosis not present

## 2023-09-09 ENCOUNTER — Ambulatory Visit: Payer: PPO | Admitting: Pulmonary Disease

## 2023-09-09 DIAGNOSIS — J849 Interstitial pulmonary disease, unspecified: Secondary | ICD-10-CM

## 2023-09-09 LAB — PULMONARY FUNCTION TEST
DL/VA % pred: 120 %
DL/VA: 5.02 ml/min/mmHg/L
DLCO cor % pred: 89 %
DLCO cor: 22.44 ml/min/mmHg
DLCO unc % pred: 89 %
DLCO unc: 22.44 ml/min/mmHg
FEF 25-75 Post: 4.87 L/s
FEF 25-75 Pre: 3.91 L/s
FEF2575-%Change-Post: 24 %
FEF2575-%Pred-Post: 191 %
FEF2575-%Pred-Pre: 154 %
FEV1-%Change-Post: 4 %
FEV1-%Pred-Post: 90 %
FEV1-%Pred-Pre: 85 %
FEV1-Post: 2.87 L
FEV1-Pre: 2.74 L
FEV1FVC-%Change-Post: 3 %
FEV1FVC-%Pred-Pre: 115 %
FEV6-%Change-Post: 1 %
FEV6-%Pred-Post: 79 %
FEV6-%Pred-Pre: 78 %
FEV6-Post: 3.22 L
FEV6-Pre: 3.17 L
FEV6FVC-%Change-Post: 0 %
FEV6FVC-%Pred-Post: 105 %
FEV6FVC-%Pred-Pre: 105 %
FVC-%Change-Post: 1 %
FVC-%Pred-Post: 75 %
FVC-%Pred-Pre: 74 %
FVC-Post: 3.22 L
FVC-Pre: 3.17 L
Post FEV1/FVC ratio: 89 %
Post FEV6/FVC ratio: 100 %
Pre FEV1/FVC ratio: 86 %
Pre FEV6/FVC Ratio: 100 %
RV % pred: 52 %
RV: 1.17 L
TLC % pred: 67 %
TLC: 4.44 L

## 2023-09-09 NOTE — Progress Notes (Signed)
 Full PFT performed today.

## 2023-09-09 NOTE — Patient Instructions (Signed)
 Full PFT performed today.

## 2023-10-22 ENCOUNTER — Ambulatory Visit: Payer: PPO | Admitting: Pulmonary Disease

## 2023-10-24 DIAGNOSIS — I5189 Other ill-defined heart diseases: Secondary | ICD-10-CM | POA: Diagnosis not present

## 2023-10-24 DIAGNOSIS — J45909 Unspecified asthma, uncomplicated: Secondary | ICD-10-CM | POA: Diagnosis not present

## 2023-10-24 DIAGNOSIS — Z0000 Encounter for general adult medical examination without abnormal findings: Secondary | ICD-10-CM | POA: Diagnosis not present

## 2023-10-24 DIAGNOSIS — F3341 Major depressive disorder, recurrent, in partial remission: Secondary | ICD-10-CM | POA: Diagnosis not present

## 2023-10-24 DIAGNOSIS — E782 Mixed hyperlipidemia: Secondary | ICD-10-CM | POA: Diagnosis not present

## 2023-10-24 DIAGNOSIS — I110 Hypertensive heart disease with heart failure: Secondary | ICD-10-CM | POA: Diagnosis not present

## 2023-10-24 DIAGNOSIS — E1169 Type 2 diabetes mellitus with other specified complication: Secondary | ICD-10-CM | POA: Diagnosis not present

## 2023-10-24 DIAGNOSIS — C61 Malignant neoplasm of prostate: Secondary | ICD-10-CM | POA: Diagnosis not present

## 2023-10-24 DIAGNOSIS — E1129 Type 2 diabetes mellitus with other diabetic kidney complication: Secondary | ICD-10-CM | POA: Diagnosis not present

## 2023-10-24 DIAGNOSIS — Z6838 Body mass index (BMI) 38.0-38.9, adult: Secondary | ICD-10-CM | POA: Diagnosis not present

## 2023-10-24 DIAGNOSIS — I10 Essential (primary) hypertension: Secondary | ICD-10-CM | POA: Diagnosis not present

## 2023-10-27 DIAGNOSIS — I10 Essential (primary) hypertension: Secondary | ICD-10-CM | POA: Diagnosis not present

## 2023-10-27 DIAGNOSIS — J45909 Unspecified asthma, uncomplicated: Secondary | ICD-10-CM | POA: Diagnosis not present

## 2023-10-27 DIAGNOSIS — R809 Proteinuria, unspecified: Secondary | ICD-10-CM | POA: Diagnosis not present

## 2023-10-27 DIAGNOSIS — E1169 Type 2 diabetes mellitus with other specified complication: Secondary | ICD-10-CM | POA: Diagnosis not present

## 2023-10-27 DIAGNOSIS — E559 Vitamin D deficiency, unspecified: Secondary | ICD-10-CM | POA: Diagnosis not present

## 2023-10-27 DIAGNOSIS — I110 Hypertensive heart disease with heart failure: Secondary | ICD-10-CM | POA: Diagnosis not present

## 2023-10-27 DIAGNOSIS — E782 Mixed hyperlipidemia: Secondary | ICD-10-CM | POA: Diagnosis not present

## 2023-10-27 DIAGNOSIS — C61 Malignant neoplasm of prostate: Secondary | ICD-10-CM | POA: Diagnosis not present

## 2023-10-27 DIAGNOSIS — E1129 Type 2 diabetes mellitus with other diabetic kidney complication: Secondary | ICD-10-CM | POA: Diagnosis not present

## 2023-10-27 DIAGNOSIS — J309 Allergic rhinitis, unspecified: Secondary | ICD-10-CM | POA: Diagnosis not present

## 2023-10-27 DIAGNOSIS — Z0000 Encounter for general adult medical examination without abnormal findings: Secondary | ICD-10-CM | POA: Diagnosis not present

## 2023-10-27 DIAGNOSIS — I5189 Other ill-defined heart diseases: Secondary | ICD-10-CM | POA: Diagnosis not present

## 2023-11-27 ENCOUNTER — Ambulatory Visit: Admitting: Pulmonary Disease

## 2023-11-27 ENCOUNTER — Encounter: Payer: Self-pay | Admitting: Pulmonary Disease

## 2023-11-27 VITALS — BP 142/84 | HR 91 | Ht 69.0 in | Wt 250.0 lb

## 2023-11-27 DIAGNOSIS — Z7722 Contact with and (suspected) exposure to environmental tobacco smoke (acute) (chronic): Secondary | ICD-10-CM

## 2023-11-27 DIAGNOSIS — R0981 Nasal congestion: Secondary | ICD-10-CM

## 2023-11-27 DIAGNOSIS — J849 Interstitial pulmonary disease, unspecified: Secondary | ICD-10-CM

## 2023-11-27 DIAGNOSIS — G4733 Obstructive sleep apnea (adult) (pediatric): Secondary | ICD-10-CM

## 2023-11-27 DIAGNOSIS — R0602 Shortness of breath: Secondary | ICD-10-CM

## 2023-11-27 DIAGNOSIS — U099 Post covid-19 condition, unspecified: Secondary | ICD-10-CM

## 2023-11-27 MED ORDER — FLUTICASONE PROPIONATE 50 MCG/ACT NA SUSP: 1.0000 | Freq: Every day | NASAL | 2 refills | Status: AC

## 2023-11-27 MED ORDER — ALBUTEROL SULFATE HFA 108 (90 BASE) MCG/ACT IN AERS: 2.0000 | INHALATION_SPRAY | Freq: Four times a day (QID) | RESPIRATORY_TRACT | 11 refills | Status: AC | PRN

## 2023-11-27 NOTE — Patient Instructions (Addendum)
 Use flonase  nasal spray for nasal congestion, 1 spray per nostril each day  Your breathing tests are improved compared to 2022 but you still have mild restriction  Use albuterol  inhaler 1-2 puffs every 4-6 hours as needed  We will find a new DME company for you to work with regarding your CPAP for sleep apnea  Follow up in 1 year, call sooner if needed

## 2023-11-27 NOTE — Progress Notes (Unsigned)
 Synopsis: Referred by Sharry Deem, NP for respiratory failure  Subjective:   PATIENT ID: Clayton [name] GENDER: male DOB: 1956/03/15, MRN: [id number]  HPI  Chief Complaint  Patient presents with   Follow-up   Clayton [name] is a 68 year old male, never smoker with history of obstructive sleep apnea, obesity, diabetes mellitus type II, asthma and respiratory failure after covid 19 pneumonia 05/2020 who returns to pulmonary clinic follow up.   He experiences persistent breathing difficulties, particularly when climbing stairs, which leaves him gasping for air. Recent pulmonary function tests show improvement compared to three years ago. He is not currently using supplemental oxygen, even at night. His CPAP machine is malfunctioning, blowing air at full blast and not adjusting automatically, leading to discomfort. He is dissatisfied with his current DME provider, [facility].  He has a history of bilateral hip replacements last year, which alleviated significant leg pain. However, he now experiences knee pain and has an upcoming appointment with a doctor next month to address this issue.   He experiences nasal congestion and dryness at night, for which he has been using an over-the-counter nasal spray, Afrin, almost nightly for the past month. This congestion causes him to breathe through his mouth. He has previously used Flonase , a steroid nasal spray, years ago.  He uses a nebulizer in the morning, which provides relief from breathing difficulties. He has requested a refill for his albuterol  inhaler, which he uses as needed.  OV 02/25/23 He has been off supplemental oxygen since last year. He has dyspnea taking 1 flight of stairs. He has lost 10lbs recently and has noted improvement in his dyspnea. He is planning on having total hip arthroplasty in the near future and needs pre-op evaluation.  OV 04/10/22 He has returned to his weight prior to the covid 19 infection and is now up to 255lbs.  He is not using CPAP due to issues with mask fit and moving a lot at night. He is being followed for chronic back pain by surgery.   He completed pulmonary rehab 05/31/21 and did not require supplemental oxygen with exertion.   OV 12/11/20 He has been weaned down to 1L of supplemental oxygen throughout the day and night. He received his cpap machine recently and will be starting that tonight. He continues to experience exertional dyspnea especially with stairs otherwise he reports he continues to notice some improvement in his shortness of breath.  His PFTs 10/24/20 showed showed mild restrictive defect.   Past Medical History:  Diagnosis Date   Anxiety    Arthritis    Asthma    pulmonary allergies- no asthma per pt   Atrophic condition of skin    Cellulitis/abscess - trunk    COPD (chronic obstructive pulmonary disease) (HCC)    Cyst    shoulder   Depression    Diabetes mellitus without complication (HCC)    Dyspnea    Enlarged prostate    Hyperlipidemia    Hypertension    Hypertrophic condition of skin    Lipoma    Obesity    Pneumonia    hx child   Sleep apnea    no cpap   Stones in the urinary tract    Vitamin D deficiency      Family History  Problem Relation Age of Onset   Heart attack Father    Heart disease Father        heart attack   Allergies Mother    Asthma Mother  Social History   Socioeconomic History   Marital status: Married    Spouse name: Not on file   Number of children: 1   Years of education: Not on file   Highest education level: Not on file  Occupational History   Occupation: Advice worker: [employer]  Tobacco Use   Smoking status: Never    Passive exposure: Past   Smokeless tobacco: Never  Vaping Use   Vaping status: Never Used  Substance and Sexual Activity   Alcohol use: Yes    Comment: Occasional   Drug use: No   Sexual activity: Not Currently  Other Topics Concern   Not on file  Social History Narrative    Not on file   Social Drivers of Health   Financial Resource Strain: Not on file  Food Insecurity: No Food Insecurity (06/10/2023)   Hunger Vital Sign    Worried About Running Out of Food in the Last Year: Never true    Ran Out of Food in the Last Year: Never true  Transportation Needs: No Transportation Needs (06/10/2023)   PRAPARE - Administrator, Civil Service (Medical): No    Lack of Transportation (Non-Medical): No  Physical Activity: Not on file  Stress: Not on file  Social Connections: Not on file  Intimate Partner Violence: Not At Risk (06/10/2023)   Humiliation, Afraid, Rape, and Kick questionnaire    Fear of Current or Ex-Partner: No    Emotionally Abused: No    Physically Abused: No    Sexually Abused: No     No Known Allergies   Outpatient Medications Prior to Visit  Medication Sig Dispense Refill   Acetaminophen  500 MG capsule Take 1,000 mg by mouth every 6 (six) hours as needed for moderate pain (pain score 4-6). Take 2000 mg in the morning, may take a second 2000 mg dose during the day as needed for pain     atorvastatin  (LIPITOR) 40 MG tablet Take 40 mg by mouth daily.     calcium  carbonate (TUMS - DOSED IN MG ELEMENTAL CALCIUM ) 500 MG chewable tablet Chew 2 tablets by mouth daily as needed for indigestion or heartburn.     clonazePAM  (KLONOPIN ) 0.5 MG tablet Take 0.5 mg by mouth 2 (two) times daily.     diphenhydramine -acetaminophen  (TYLENOL  PM) 25-500 MG TABS tablet Take 2 tablets by mouth at bedtime as needed (sleep).     ipratropium-albuterol  (DUONEB) 0.5-2.5 (3) MG/3ML SOLN Take 3 mLs by nebulization every 6 (six) hours as needed (shortness of breath).     losartan  (COZAAR ) 50 MG tablet Take 50 mg by mouth daily.     metFORMIN  (GLUCOPHAGE -XR) 500 MG 24 hr tablet Take 1,000 mg by mouth 2 (two) times daily with a meal.     methocarbamol  (ROBAXIN ) 500 MG tablet Take 1 tablet (500 mg total) by mouth every 6 (six) hours as needed for muscle spasms. 40  tablet 2   Multiple Vitamin (MULTIVITAMIN WITH MINERALS) TABS tablet Take 1 tablet by mouth daily.     NON FORMULARY Pt uses cpap     oxyCODONE  (OXY IR/ROXICODONE ) 5 MG immediate release tablet Take 1 tablet (5 mg total) by mouth every 4 (four) hours as needed for severe pain (pain score 7-10). 42 tablet 0   polyethylene glycol (MIRALAX  / GLYCOLAX ) 17 g packet Take 17 g by mouth 2 (two) times daily. (Patient taking differently: Take 17 g by mouth daily as needed for moderate constipation.) 14 each  0   sertraline  (ZOLOFT ) 100 MG tablet Take 200 mg by mouth daily.     sildenafil (REVATIO) 20 MG tablet Take 20-100 mg by mouth daily as needed (ED).     tamsulosin  (FLOMAX ) 0.4 MG CAPS capsule Take 0.4 mg by mouth daily.     albuterol  (VENTOLIN  HFA) 108 (90 Base) MCG/ACT inhaler Inhale 2 puffs into the lungs every 4 (four) hours as needed for wheezing or shortness of breath. 1 each 0   rivaroxaban  (XARELTO ) 10 MG TABS tablet Take 1 tablet (10 mg total) by mouth daily with breakfast for 14 days. Then take aspirin 81 mg twice daily for another month 14 tablet 0   No facility-administered medications prior to visit.    Review of Systems  Constitutional:  Negative for chills, fever, malaise/fatigue and weight loss.  HENT:  Positive for congestion. Negative for sinus pain and sore throat.   Eyes: Negative.   Respiratory:  Positive for shortness of breath. Negative for cough, hemoptysis, sputum production and wheezing.   Cardiovascular:  Negative for chest pain, palpitations, orthopnea, claudication, leg swelling and PND.  Gastrointestinal:  Negative for abdominal pain, heartburn, nausea and vomiting.  Genitourinary: Negative.   Musculoskeletal: Negative.   Neurological:  Negative for dizziness, weakness and headaches.  Psychiatric/Behavioral: Negative.     Objective:   Vitals:   11/27/23 1321  BP: (!) 142/84  Pulse: 91  Weight: 250 lb (113.4 kg)  Height: 5\' 9"  (1.753 m)   Physical  Exam Constitutional:      General: He is not in acute distress.    Appearance: He is obese.  HENT:     Head: Normocephalic and atraumatic.     Nose: Nose normal.     Mouth/Throat:     Mouth: Mucous membranes are moist.     Pharynx: Oropharynx is clear.  Cardiovascular:     Rate and Rhythm: Normal rate and regular rhythm.     Pulses: Normal pulses.     Heart sounds: Normal heart sounds. No murmur heard. Pulmonary:     Effort: Pulmonary effort is normal.     Breath sounds: No wheezing, rhonchi or rales.  Musculoskeletal:     Right lower leg: No edema.     Left lower leg: No edema.  Neurological:     General: No focal deficit present.     Mental Status: He is alert.    CBC    Component Value Date/Time   WBC 21.9 (H) 06/11/2023 0327   RBC 3.80 (L) 06/11/2023 0327   HGB 11.5 (L) 06/11/2023 0327   HCT 35.9 (L) 06/11/2023 0327   PLT 281 06/11/2023 0327   MCV 94.5 06/11/2023 0327   MCH 30.3 06/11/2023 0327   MCHC 32.0 06/11/2023 0327   RDW 13.2 06/11/2023 0327   LYMPHSABS 1.0 05/31/2020 0423   MONOABS 0.8 05/31/2020 0423   EOSABS 0.1 05/31/2020 0423   BASOSABS 0.0 05/31/2020 0423      Latest Ref Rng & Units 06/11/2023    3:27 AM 05/29/2023    8:13 AM 03/05/2023    4:04 AM  BMP  Glucose 70 - 99 mg/dL 811  914  782   BUN 8 - 23 mg/dL 22  17  19    Creatinine 0.61 - 1.24 mg/dL 9.56  2.13  0.86   Sodium 135 - 145 mmol/L 136  139  135   Potassium 3.5 - 5.1 mmol/L 3.9  3.9  3.9   Chloride 98 - 111 mmol/L 105  106  102   CO2 22 - 32 mmol/L 22  22  24    Calcium  8.9 - 10.3 mg/dL 8.8  8.9  8.5    Chest imaging: HRCT Chest 04/27/21 1. The appearance of the lungs is compatible with interstitial lung disease, with a spectrum of findings considered most compatible with an alternative diagnosis (not usual interstitial pneumonia) per current ATS guidelines. 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 2. Aortic atherosclerosis, in addition to left anterior descending coronary artery disease. Please note that although the presence of coronary artery calcium  documents the presence of coronary artery disease, the severity of this disease and any potential stenosis cannot be assessed on this non-gated CT examination. Assessment for potential risk factor modification, dietary therapy or pharmacologic therapy may be warranted, if clinically indicated. 3. Hepatic steatosis.  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 No visible effusion or air leak. Stable heart size which is accentuated by technique.  PFT:    Latest Ref Rng & Units 09/09/2023    4:01 PM 10/24/2020   11:40 AM  PFT Results  FVC-Pre L 3.17  2.67   FVC-Predicted Pre % 74  61   FVC-Post L 3.22  2.66   FVC-Predicted Post % 75  61   Pre FEV1/FVC % % 86  85   Post FEV1/FCV % % 89  89   FEV1-Pre L 2.74  2.28   FEV1-Predicted Pre % 85  70   FEV1-Post L 2.87  2.36   DLCO uncorrected ml/min/mmHg 22.44  20.61   DLCO UNC% % 89  80   DLCO corrected ml/min/mmHg 22.44  20.61   DLCO COR %Predicted % 89  80   DLVA Predicted % 120  121   TLC L 4.44  4.20   TLC % Predicted % 67  63   RV % Predicted % 52  72   PFTs 2022: Mild restrictive defect  Echo: 05/02/20 1. Limited study with poor echo windows, Definity contrast given   2. Left ventricular ejection fraction, by estimation, is 65 to 70%. The  left ventricle has normal function. The left ventricle has no regional  wall motion abnormalities. There is mild left ventricular hypertrophy.  Left ventricular diastolic parameters  are consistent with Grade I diastolic dysfunction (impaired relaxation).   3. The aortic valve is tricuspid. Aortic valve regurgitation is not  visualized. Mild aortic valve sclerosis is present, with no evidence of  aortic valve stenosis.   Lower extremity Doppler US   04/28/20 RIGHT:  - Findings consistent with acute deep vein thrombosis involving the right  posterior tibial veins, and right peroneal veins.  - No cystic structure found in the popliteal fossa.     LEFT:  - Findings consistent with acute deep vein thrombosis involving the left  posterior tibial veins, and left soleal veins.  - No cystic structure found in the popliteal fossa.   Sleep Study 08/27/20   Overall AHI 7.2/hr REM AHI 28.8/hr  Supine AHI 50.6/hr Min SpO2 77%  Assessment & Plan:   ILD (interstitial lung disease) (HCC)  Post-COVID chronic shortness of breath  Nasal congestion - Plan: fluticasone  (FLONASE ) 50 MCG/ACT nasal spray, albuterol  (VENTOLIN  HFA) 108 (90 Base) MCG/ACT inhaler  OSA (obstructive sleep apnea) - Plan: Ambulatory Referral for DME  Discussion: Devun Anna is a 68 year old male, never smoker with history of obstructive sleep apnea,  obesity, diabetes mellitus type II, asthma and acute hypoxemic respiratory failure after covid 19 pneumonia 05/2020 who returns to pulmonary clinic follow up.   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 Pulmonary function tests show improved FVC and FEV1. Total lung capacity slightly reduced, diffusion capacity normal. No supplemental oxygen needed.  - Prescribed albuterol  inhaler. - Advised routine walking and exercise as tolerated.  Nasal Congestion - prescribed flonase  daily - stop afrin  Sleep apnea - requested new DME as he has not enjoyed working with [employer] - will send new DME order to support his current CPAP machine - CPAP settings auto-titrating 5-15cmH2O   Follow up in 1 year, call sooner if needed  Duaine German, MD [facility] Office: [phone]    Current Outpatient Medications:    Acetaminophen  500 MG capsule, Take 1,000 mg by mouth every 6 (six) hours as needed for moderate pain (pain score 4-6). Take 2000 mg in  the morning, may take a second 2000 mg dose during the day as needed for pain, Disp: , Rfl:    albuterol  (VENTOLIN  HFA) 108 (90 Base) MCG/ACT inhaler, Inhale 2 puffs into the lungs every 6 (six) hours as needed for wheezing or shortness of breath., Disp: 8 g, Rfl: 11   atorvastatin  (LIPITOR) 40 MG tablet, Take 40 mg by mouth daily., Disp: , Rfl:    calcium  carbonate (TUMS - DOSED IN MG ELEMENTAL CALCIUM ) 500 MG chewable tablet, Chew 2 tablets by mouth daily as needed for indigestion or heartburn., Disp: , Rfl:    clonazePAM  (KLONOPIN ) 0.5 MG tablet, Take 0.5 mg by mouth 2 (two) times daily., Disp: , Rfl:    diphenhydramine -acetaminophen  (TYLENOL  PM) 25-500 MG TABS tablet, Take 2 tablets by mouth at bedtime as needed (sleep)., Disp: , Rfl:    fluticasone  (FLONASE ) 50 MCG/ACT nasal spray, Place 1 spray into both nostrils daily., Disp: 16 g, Rfl: 2   ipratropium-albuterol  (DUONEB) 0.5-2.5 (3) MG/3ML SOLN, Take 3 mLs by nebulization every 6 (six) hours as needed (shortness of breath)., Disp: , Rfl:    losartan  (COZAAR ) 50 MG tablet, Take 50 mg by mouth daily., Disp: , Rfl:    metFORMIN  (GLUCOPHAGE -XR) 500 MG 24 hr tablet, Take 1,000 mg by mouth 2 (two) times daily with a meal., Disp: , Rfl:    methocarbamol  (ROBAXIN ) 500 MG tablet, Take 1 tablet (500 mg total) by mouth every 6 (six) hours as needed for muscle spasms., Disp: 40 tablet, Rfl: 2   Multiple Vitamin (MULTIVITAMIN WITH MINERALS) TABS tablet, Take 1 tablet by mouth daily., Disp: , Rfl:    NON FORMULARY, Pt uses cpap, Disp: , Rfl:    oxyCODONE  (OXY IR/ROXICODONE ) 5 MG immediate release tablet, Take 1 tablet (5 mg total) by mouth every 4 (four) hours as needed for severe pain (pain score 7-10)., Disp: 42 tablet, Rfl: 0   polyethylene glycol (MIRALAX  / GLYCOLAX ) 17 g packet, Take 17 g by mouth 2 (two) times daily. (Patient taking differently: Take 17 g by mouth daily as needed for moderate constipation.), Disp: 14 each, Rfl: 0   sertraline  (ZOLOFT )  100 MG tablet, Take 200 mg by mouth daily., Disp: , Rfl:    sildenafil (REVATIO) 20 MG tablet, Take 20-100 mg by mouth daily as needed (ED)., Disp: , Rfl:    tamsulosin  (FLOMAX ) 0.4 MG CAPS capsule, Take 0.4 mg by mouth daily., Disp: , Rfl:    rivaroxaban  (XARELTO ) 10 MG TABS tablet, Take 1 tablet (  10 mg total) by mouth daily with breakfast for 14 days. Then take aspirin 81 mg twice daily for another month, Disp: 14 tablet, Rfl: 0

## 2023-11-28 ENCOUNTER — Encounter: Payer: Self-pay | Admitting: Pulmonary Disease

## 2023-12-30 DIAGNOSIS — C61 Malignant neoplasm of prostate: Secondary | ICD-10-CM | POA: Diagnosis not present

## 2023-12-31 DIAGNOSIS — M25561 Pain in right knee: Secondary | ICD-10-CM | POA: Diagnosis not present

## 2023-12-31 DIAGNOSIS — G8929 Other chronic pain: Secondary | ICD-10-CM | POA: Diagnosis not present

## 2024-01-06 DIAGNOSIS — N5201 Erectile dysfunction due to arterial insufficiency: Secondary | ICD-10-CM | POA: Diagnosis not present

## 2024-01-06 DIAGNOSIS — R350 Frequency of micturition: Secondary | ICD-10-CM | POA: Diagnosis not present

## 2024-01-06 DIAGNOSIS — C61 Malignant neoplasm of prostate: Secondary | ICD-10-CM | POA: Diagnosis not present

## 2024-01-06 DIAGNOSIS — N401 Enlarged prostate with lower urinary tract symptoms: Secondary | ICD-10-CM | POA: Diagnosis not present

## 2024-02-27 DIAGNOSIS — H52223 Regular astigmatism, bilateral: Secondary | ICD-10-CM | POA: Diagnosis not present

## 2024-02-27 DIAGNOSIS — H2513 Age-related nuclear cataract, bilateral: Secondary | ICD-10-CM | POA: Diagnosis not present

## 2024-02-27 DIAGNOSIS — H524 Presbyopia: Secondary | ICD-10-CM | POA: Diagnosis not present

## 2024-02-27 DIAGNOSIS — H179 Unspecified corneal scar and opacity: Secondary | ICD-10-CM | POA: Diagnosis not present

## 2024-02-27 DIAGNOSIS — E119 Type 2 diabetes mellitus without complications: Secondary | ICD-10-CM | POA: Diagnosis not present

## 2024-02-27 DIAGNOSIS — H5203 Hypermetropia, bilateral: Secondary | ICD-10-CM | POA: Diagnosis not present

## 2024-03-16 DIAGNOSIS — L820 Inflamed seborrheic keratosis: Secondary | ICD-10-CM | POA: Diagnosis not present

## 2024-03-16 DIAGNOSIS — L821 Other seborrheic keratosis: Secondary | ICD-10-CM | POA: Diagnosis not present

## 2024-03-16 DIAGNOSIS — D229 Melanocytic nevi, unspecified: Secondary | ICD-10-CM | POA: Diagnosis not present

## 2024-03-16 DIAGNOSIS — D1801 Hemangioma of skin and subcutaneous tissue: Secondary | ICD-10-CM | POA: Diagnosis not present

## 2024-03-16 DIAGNOSIS — L578 Other skin changes due to chronic exposure to nonionizing radiation: Secondary | ICD-10-CM | POA: Diagnosis not present

## 2024-03-16 DIAGNOSIS — L814 Other melanin hyperpigmentation: Secondary | ICD-10-CM | POA: Diagnosis not present

## 2024-03-16 DIAGNOSIS — L918 Other hypertrophic disorders of the skin: Secondary | ICD-10-CM | POA: Diagnosis not present

## 2024-04-08 DIAGNOSIS — E782 Mixed hyperlipidemia: Secondary | ICD-10-CM | POA: Diagnosis not present

## 2024-04-08 DIAGNOSIS — E1169 Type 2 diabetes mellitus with other specified complication: Secondary | ICD-10-CM | POA: Diagnosis not present

## 2024-04-08 DIAGNOSIS — F3341 Major depressive disorder, recurrent, in partial remission: Secondary | ICD-10-CM | POA: Diagnosis not present

## 2024-04-08 DIAGNOSIS — F411 Generalized anxiety disorder: Secondary | ICD-10-CM | POA: Diagnosis not present

## 2024-04-08 DIAGNOSIS — Z6838 Body mass index (BMI) 38.0-38.9, adult: Secondary | ICD-10-CM | POA: Diagnosis not present

## 2024-04-08 DIAGNOSIS — F4310 Post-traumatic stress disorder, unspecified: Secondary | ICD-10-CM | POA: Diagnosis not present

## 2024-04-08 DIAGNOSIS — I10 Essential (primary) hypertension: Secondary | ICD-10-CM | POA: Diagnosis not present

## 2024-04-13 DIAGNOSIS — M5416 Radiculopathy, lumbar region: Secondary | ICD-10-CM | POA: Diagnosis not present

## 2024-04-13 DIAGNOSIS — G8929 Other chronic pain: Secondary | ICD-10-CM | POA: Diagnosis not present

## 2024-04-14 DIAGNOSIS — E119 Type 2 diabetes mellitus without complications: Secondary | ICD-10-CM | POA: Diagnosis not present

## 2024-04-14 DIAGNOSIS — M1711 Unilateral primary osteoarthritis, right knee: Secondary | ICD-10-CM | POA: Diagnosis not present

## 2024-04-22 DIAGNOSIS — M5416 Radiculopathy, lumbar region: Secondary | ICD-10-CM | POA: Diagnosis not present

## 2024-05-06 ENCOUNTER — Other Ambulatory Visit: Payer: Self-pay | Admitting: Urology

## 2024-05-06 DIAGNOSIS — C61 Malignant neoplasm of prostate: Secondary | ICD-10-CM

## 2024-05-10 ENCOUNTER — Other Ambulatory Visit (HOSPITAL_COMMUNITY): Payer: Self-pay | Admitting: Urology

## 2024-05-10 DIAGNOSIS — C61 Malignant neoplasm of prostate: Secondary | ICD-10-CM

## 2024-06-23 ENCOUNTER — Telehealth: Payer: Self-pay

## 2024-06-23 NOTE — Telephone Encounter (Signed)
 Called patient to bring in sd card,verbalized understanding .

## 2024-06-24 ENCOUNTER — Ambulatory Visit: Admitting: Pulmonary Disease

## 2024-06-24 ENCOUNTER — Encounter: Payer: Self-pay | Admitting: Pulmonary Disease

## 2024-06-30 DIAGNOSIS — C61 Malignant neoplasm of prostate: Secondary | ICD-10-CM | POA: Diagnosis not present

## 2024-07-05 ENCOUNTER — Ambulatory Visit (HOSPITAL_COMMUNITY)
Admission: RE | Admit: 2024-07-05 | Discharge: 2024-07-05 | Disposition: A | Source: Ambulatory Visit | Attending: Urology

## 2024-07-05 DIAGNOSIS — K5730 Diverticulosis of large intestine without perforation or abscess without bleeding: Secondary | ICD-10-CM | POA: Diagnosis not present

## 2024-07-05 DIAGNOSIS — Z96643 Presence of artificial hip joint, bilateral: Secondary | ICD-10-CM | POA: Diagnosis not present

## 2024-07-05 DIAGNOSIS — C61 Malignant neoplasm of prostate: Secondary | ICD-10-CM | POA: Diagnosis not present

## 2024-07-05 MED ORDER — GADOBUTROL 1 MMOL/ML IV SOLN
10.0000 mL | Freq: Once | INTRAVENOUS | Status: AC | PRN
  Administered 2024-07-05: 10 mL via INTRAVENOUS

## 2024-07-13 DIAGNOSIS — N401 Enlarged prostate with lower urinary tract symptoms: Secondary | ICD-10-CM | POA: Diagnosis not present

## 2024-07-13 DIAGNOSIS — C61 Malignant neoplasm of prostate: Secondary | ICD-10-CM | POA: Diagnosis not present

## 2024-07-13 DIAGNOSIS — R3912 Poor urinary stream: Secondary | ICD-10-CM | POA: Diagnosis not present

## 2024-07-13 DIAGNOSIS — R3914 Feeling of incomplete bladder emptying: Secondary | ICD-10-CM | POA: Diagnosis not present

## 2024-07-13 DIAGNOSIS — R350 Frequency of micturition: Secondary | ICD-10-CM | POA: Diagnosis not present
